# Patient Record
Sex: Female | Born: 1965 | Race: Black or African American | Hispanic: No | Smoking: Current some day smoker
Health system: Southern US, Community
[De-identification: ages and names within clinical notes are randomized; demographics above are authoritative.]

## PROBLEM LIST (undated history)

## (undated) DIAGNOSIS — Z973 Presence of spectacles and contact lenses: Secondary | ICD-10-CM

## (undated) DIAGNOSIS — M797 Fibromyalgia: Secondary | ICD-10-CM

## (undated) DIAGNOSIS — F419 Anxiety disorder, unspecified: Secondary | ICD-10-CM

## (undated) DIAGNOSIS — J302 Other seasonal allergic rhinitis: Secondary | ICD-10-CM

## (undated) DIAGNOSIS — Z794 Long term (current) use of insulin: Secondary | ICD-10-CM

## (undated) DIAGNOSIS — R0981 Nasal congestion: Secondary | ICD-10-CM

## (undated) DIAGNOSIS — Z860100 Personal history of colon polyps, unspecified: Secondary | ICD-10-CM

## (undated) DIAGNOSIS — E7800 Pure hypercholesterolemia, unspecified: Secondary | ICD-10-CM

## (undated) DIAGNOSIS — K219 Gastro-esophageal reflux disease without esophagitis: Secondary | ICD-10-CM

## (undated) DIAGNOSIS — Z972 Presence of dental prosthetic device (complete) (partial): Secondary | ICD-10-CM

## (undated) DIAGNOSIS — K589 Irritable bowel syndrome without diarrhea: Secondary | ICD-10-CM

## (undated) DIAGNOSIS — F329 Major depressive disorder, single episode, unspecified: Secondary | ICD-10-CM

## (undated) DIAGNOSIS — I10 Essential (primary) hypertension: Secondary | ICD-10-CM

## (undated) DIAGNOSIS — Z8659 Personal history of other mental and behavioral disorders: Secondary | ICD-10-CM

## (undated) DIAGNOSIS — E119 Type 2 diabetes mellitus without complications: Secondary | ICD-10-CM

## (undated) DIAGNOSIS — M1990 Unspecified osteoarthritis, unspecified site: Secondary | ICD-10-CM

## (undated) DIAGNOSIS — R519 Headache, unspecified: Secondary | ICD-10-CM

## (undated) DIAGNOSIS — T7840XA Allergy, unspecified, initial encounter: Secondary | ICD-10-CM

## (undated) DIAGNOSIS — M5030 Other cervical disc degeneration, unspecified cervical region: Secondary | ICD-10-CM

## (undated) DIAGNOSIS — E559 Vitamin D deficiency, unspecified: Secondary | ICD-10-CM

## (undated) DIAGNOSIS — G8929 Other chronic pain: Secondary | ICD-10-CM

## (undated) DIAGNOSIS — R51 Headache: Secondary | ICD-10-CM

## (undated) HISTORY — DX: Unspecified osteoarthritis, unspecified site: M19.90

## (undated) HISTORY — DX: Allergy, unspecified, initial encounter: T78.40XA

## (undated) HISTORY — PX: MULTIPLE TOOTH EXTRACTIONS: SHX2053

## (undated) HISTORY — PX: CARDIAC CATHETERIZATION: SHX172

## (undated) HISTORY — PX: BRAIN SURGERY: SHX531

## (undated) HISTORY — DX: Irritable bowel syndrome, unspecified: K58.9

## (undated) HISTORY — PX: TUBAL LIGATION: SHX77

## (undated) HISTORY — PX: ABDOMINAL HYSTERECTOMY: SHX81

---

## 2007-12-09 HISTORY — PX: TOTAL ABDOMINAL HYSTERECTOMY W/ BILATERAL SALPINGOOPHORECTOMY: SHX83

## 2015-11-27 ENCOUNTER — Encounter (HOSPITAL_COMMUNITY): Payer: Self-pay | Admitting: Emergency Medicine

## 2015-11-27 ENCOUNTER — Emergency Department (HOSPITAL_COMMUNITY)
Admission: EM | Admit: 2015-11-27 | Discharge: 2015-11-27 | Disposition: A | Discharge status: Home / Self Care | Payer: Medicaid - Out of State | Attending: Emergency Medicine | Admitting: Emergency Medicine

## 2015-11-27 ENCOUNTER — Emergency Department (HOSPITAL_COMMUNITY): Payer: Medicaid - Out of State

## 2015-11-27 DIAGNOSIS — I10 Essential (primary) hypertension: Secondary | ICD-10-CM | POA: Insufficient documentation

## 2015-11-27 DIAGNOSIS — F17200 Nicotine dependence, unspecified, uncomplicated: Secondary | ICD-10-CM | POA: Diagnosis not present

## 2015-11-27 DIAGNOSIS — Z7982 Long term (current) use of aspirin: Secondary | ICD-10-CM | POA: Diagnosis not present

## 2015-11-27 DIAGNOSIS — R0981 Nasal congestion: Secondary | ICD-10-CM | POA: Diagnosis present

## 2015-11-27 DIAGNOSIS — E119 Type 2 diabetes mellitus without complications: Secondary | ICD-10-CM | POA: Insufficient documentation

## 2015-11-27 DIAGNOSIS — Z794 Long term (current) use of insulin: Secondary | ICD-10-CM | POA: Insufficient documentation

## 2015-11-27 DIAGNOSIS — J0101 Acute recurrent maxillary sinusitis: Secondary | ICD-10-CM | POA: Diagnosis not present

## 2015-11-27 DIAGNOSIS — Z79899 Other long term (current) drug therapy: Secondary | ICD-10-CM | POA: Insufficient documentation

## 2015-11-27 HISTORY — DX: Essential (primary) hypertension: I10

## 2015-11-27 LAB — I-STAT TROPONIN, ED
TROPONIN I, POC: 0 ng/mL (ref 0.00–0.08)
Troponin i, poc: 0 ng/mL (ref 0.00–0.08)

## 2015-11-27 LAB — CBC
HCT: 37.1 % (ref 36.0–46.0)
Hemoglobin: 12.1 g/dL (ref 12.0–15.0)
MCH: 26.8 pg (ref 26.0–34.0)
MCHC: 32.6 g/dL (ref 30.0–36.0)
MCV: 82.3 fL (ref 78.0–100.0)
PLATELETS: 279 10*3/uL (ref 150–400)
RBC: 4.51 MIL/uL (ref 3.87–5.11)
RDW: 13.1 % (ref 11.5–15.5)
WBC: 9.2 10*3/uL (ref 4.0–10.5)

## 2015-11-27 LAB — COMPREHENSIVE METABOLIC PANEL
ALT: 19 U/L (ref 14–54)
AST: 18 U/L (ref 15–41)
Albumin: 3.2 g/dL — ABNORMAL LOW (ref 3.5–5.0)
Alkaline Phosphatase: 85 U/L (ref 38–126)
Anion gap: 8 (ref 5–15)
BUN: 8 mg/dL (ref 6–20)
CHLORIDE: 105 mmol/L (ref 101–111)
CO2: 27 mmol/L (ref 22–32)
Calcium: 9.4 mg/dL (ref 8.9–10.3)
Creatinine, Ser: 0.71 mg/dL (ref 0.44–1.00)
GFR calc Af Amer: >60 mL/min (ref >60)
Glucose, Bld: 297 mg/dL — ABNORMAL HIGH (ref 65–99)
POTASSIUM: 4 mmol/L (ref 3.5–5.1)
SODIUM: 140 mmol/L (ref 135–145)
Total Bilirubin: 0.4 mg/dL (ref 0.3–1.2)
Total Protein: 6.4 g/dL — ABNORMAL LOW (ref 6.5–8.1)

## 2015-11-27 MED ORDER — AMOXICILLIN-POT CLAVULANATE 500-125 MG PO TABS: 1.0000 | ORAL_TABLET | Freq: Three times a day (TID) | ORAL | Status: DC

## 2015-11-27 MED ORDER — NASAL MOISTURIZER COMBINATION NA AERS: 1.0000 | INHALATION_SPRAY | Freq: Three times a day (TID) | NASAL | Status: DC

## 2015-11-27 NOTE — ED Notes (Signed)
Pt departed in NAD.  

## 2015-11-27 NOTE — ED Provider Notes (Signed)
CSN: [id number]     Arrival date & time 11/27/15  0203 History   By signing my name below, I, Evelene Croon, attest that this documentation has been prepared under the direction and in the presence of [name], MD . Electronically Signed: Evelene Croon, Scribe. 11/27/2015. 3:06 AM.    Chief Complaint  Patient presents with  . Nasal Congestion    The history is provided by the patient. No language interpreter was used.   HPI Comments:  Ellen Avery is a 49 y.o. female who presents to the Emergency Department complaining of nasal congestion with associated facial pressure and bilateral ear pain for ~ 2 months. She also reports  dry cough which is worse at night, and LUE pain. She denies recent fever, SOB, CP, swelling to her extremities, nausea and vomiting. No alleviating factors noted. No recent travel/long periods of immobilization or h/o blood clots. She notes sick contacts at home; states her grandson has a stomach virus. Pt is a smoker, ~ 1 ppd.    Past Medical History  Diagnosis Date  . Hypertension   . Diabetes mellitus without complication [facility])    Past Surgical History  Procedure Laterality Date  . Abdominal hysterectomy     No family history on file. Social History  Substance Use Topics  . Smoking status: Current Every Day Smoker  . Smokeless tobacco: None  . Alcohol Use: No   OB History    No data available     Review of Systems  HENT: Positive for congestion and ear pain.   Respiratory: Positive for cough. Negative for shortness of breath.   Cardiovascular: Negative for chest pain and leg swelling.  Gastrointestinal: Negative for nausea and vomiting.  All other systems reviewed and are negative.    Allergies  Flagyl  Home Medications   Prior to Admission medications   Medication Sig Start Date End Date Taking? Authorizing Provider  ARIPiprazole (ABILIFY) 15 MG tablet Take 15 mg by mouth daily.   Yes Historical Provider, MD  aspirin EC 81 MG tablet  Take 81 mg by mouth daily.   Yes Historical Provider, MD  esomeprazole (NEXIUM) 40 MG capsule Take 40 mg by mouth daily at 12 noon.   Yes Historical Provider, MD  FLUoxetine (PROZAC) 20 MG capsule Take 20 mg by mouth daily.   Yes Historical Provider, MD  insulin aspart (NOVOLOG) 100 UNIT/ML injection Inject 0-5 Units into the skin 3 (three) times daily before meals. Sliding scale   Yes Historical Provider, MD  insulin glargine (LANTUS) 100 UNIT/ML injection Inject 10 Units into the skin at bedtime.   Yes Historical Provider, MD  lisinopril (PRINIVIL,ZESTRIL) 10 MG tablet Take 10 mg by mouth daily.   Yes Historical Provider, MD  metFORMIN (GLUCOPHAGE) 1000 MG tablet Take 1,000 mg by mouth daily with breakfast.   Yes Historical Provider, MD  simvastatin (ZOCOR) 40 MG tablet Take 40 mg by mouth daily.   Yes Historical Provider, MD  amoxicillin-clavulanate (AUGMENTIN) 500-125 MG tablet Take 1 tablet (500 mg total) by mouth every 8 (eight) hours. 11/27/15   [name], MD  Nasal Moisturizer Combination (OCEAN COMPLETE SINUS RINSE) AERS Place 1 spray into the nose every 8 (eight) hours. 11/27/15   [name], MD   BP 112/70 mmHg  Pulse 60  Temp(Src) 98.3 F (36.8 C) (Oral)  Resp 20  SpO2 100% Physical Exam  Constitutional: She is oriented to person, place, and time. She appears well-developed and well-nourished. No distress.  HENT:  Head: Normocephalic and atraumatic.  Nose: Rhinorrhea present.  Mouth/Throat: Posterior oropharyngeal erythema present.  Bilateral middle ear effusion; no redness   Eyes: Conjunctivae are normal.  Cardiovascular: Normal rate, regular rhythm and normal heart sounds.   Pulmonary/Chest: Effort normal and breath sounds normal. No respiratory distress.  Abdominal: Soft. Bowel sounds are normal. She exhibits no distension. There is no tenderness. There is no rebound and no guarding.  Neurological: She is alert and oriented to person, place, and time.  Skin: Skin is  warm and dry.  Psychiatric: She has a normal mood and affect.  Nursing note and vitals reviewed.   ED Course  Procedures   DIAGNOSTIC STUDIES:  Oxygen Saturation is 95% on RA, adequate by my interpretation.    COORDINATION OF CARE:  2:54 AM Smoking cessation instruction/counseling given:  counseled patient on the dangers of tobacco use, advised patient to stop smoking, and reviewed strategies to maximize success  3:00 AM Discussed treatment plan with pt at bedside and pt agreed to plan.  Labs Review Labs Reviewed  COMPREHENSIVE METABOLIC PANEL - Abnormal; Notable for the following:    Glucose, Bld 297 (*)    Total Protein 6.4 (*)    Albumin 3.2 (*)    All other components within normal limits  CBC  I-STAT TROPOININ, ED  Randolm Idol, ED    Imaging Review Dg Chest 2 View  11/27/2015  CLINICAL DATA:  49 year old female with dry cough EXAM: CHEST  2 VIEW COMPARISON:  None. FINDINGS: The heart size and mediastinal contours are within normal limits. Both lungs are clear. The visualized skeletal structures are unremarkable. IMPRESSION: No active cardiopulmonary disease. Electronically Signed   By: Anner Crete M.D.   On: 11/27/2015 04:02   I have personally reviewed and evaluated these images and lab results as part of my medical decision-making.   EKG Interpretation   Date/Time:  Tuesday November 27 2015 03:19:29 EST Ventricular Rate:  66 PR Interval:  143 QRS Duration: 97 QT Interval:  399 QTC Calculation: 418 R Axis:   58 Text Interpretation:  Sinus rhythm Borderline T wave abnormalities  Confirmed by [facility] MD, Corene Cornea [phone]) on 11/27/2015 3:55:44 AM      MDM   Final diagnoses:  Acute recurrent maxillary sinusitis   Likely recurrent acute sinusitis, will tx accordingly.   Sensation of Left arm squeezing with DM, HTN, HLD, smoking history so delta troponins checked and ecg which were all normal.   I personally performed the services described in this  documentation, which was scribed in my presence. The recorded information has been reviewed and is accurate.    [name], MD 11/27/15 [phone]

## 2015-11-27 NOTE — ED Notes (Signed)
Pt. presents with multiple complaints : reports nasal congestion , dry cough , bilateral ear ache for 1 month and left arm pain onset last week .

## 2016-01-09 ENCOUNTER — Encounter (HOSPITAL_COMMUNITY): Payer: Self-pay | Admitting: Emergency Medicine

## 2016-01-09 ENCOUNTER — Emergency Department (HOSPITAL_COMMUNITY)
Admission: EM | Admit: 2016-01-09 | Discharge: 2016-01-09 | Disposition: A | Discharge status: Home / Self Care | Payer: Medicaid Other | Attending: Emergency Medicine | Admitting: Emergency Medicine

## 2016-01-09 DIAGNOSIS — R197 Diarrhea, unspecified: Secondary | ICD-10-CM | POA: Diagnosis not present

## 2016-01-09 DIAGNOSIS — Z7951 Long term (current) use of inhaled steroids: Secondary | ICD-10-CM | POA: Diagnosis not present

## 2016-01-09 DIAGNOSIS — F17200 Nicotine dependence, unspecified, uncomplicated: Secondary | ICD-10-CM | POA: Insufficient documentation

## 2016-01-09 DIAGNOSIS — I10 Essential (primary) hypertension: Secondary | ICD-10-CM | POA: Diagnosis not present

## 2016-01-09 DIAGNOSIS — Z7982 Long term (current) use of aspirin: Secondary | ICD-10-CM | POA: Insufficient documentation

## 2016-01-09 DIAGNOSIS — R110 Nausea: Secondary | ICD-10-CM | POA: Insufficient documentation

## 2016-01-09 DIAGNOSIS — Z7984 Long term (current) use of oral hypoglycemic drugs: Secondary | ICD-10-CM | POA: Diagnosis not present

## 2016-01-09 DIAGNOSIS — E119 Type 2 diabetes mellitus without complications: Secondary | ICD-10-CM | POA: Insufficient documentation

## 2016-01-09 DIAGNOSIS — Z794 Long term (current) use of insulin: Secondary | ICD-10-CM | POA: Insufficient documentation

## 2016-01-09 DIAGNOSIS — Z79899 Other long term (current) drug therapy: Secondary | ICD-10-CM | POA: Diagnosis not present

## 2016-01-09 LAB — COMPREHENSIVE METABOLIC PANEL
ALK PHOS: 99 U/L (ref 38–126)
ALT: 18 U/L (ref 14–54)
ANION GAP: 18 — AB (ref 5–15)
AST: 23 U/L (ref 15–41)
Albumin: 3.8 g/dL (ref 3.5–5.0)
BILIRUBIN TOTAL: 0.4 mg/dL (ref 0.3–1.2)
BUN: 14 mg/dL (ref 6–20)
CALCIUM: 9.5 mg/dL (ref 8.9–10.3)
CO2: 20 mmol/L — ABNORMAL LOW (ref 22–32)
Chloride: 98 mmol/L — ABNORMAL LOW (ref 101–111)
Creatinine, Ser: 1.02 mg/dL — ABNORMAL HIGH (ref 0.44–1.00)
Glucose, Bld: 280 mg/dL — ABNORMAL HIGH (ref 65–99)
Potassium: 3.9 mmol/L (ref 3.5–5.1)
Sodium: 136 mmol/L (ref 135–145)
TOTAL PROTEIN: 7.6 g/dL (ref 6.5–8.1)

## 2016-01-09 LAB — BASIC METABOLIC PANEL
Anion gap: 15 (ref 5–15)
BUN: 14 mg/dL (ref 6–20)
CO2: 22 mmol/L (ref 22–32)
Calcium: 8.4 mg/dL — ABNORMAL LOW (ref 8.9–10.3)
Chloride: 101 mmol/L (ref 101–111)
Creatinine, Ser: 1.02 mg/dL — ABNORMAL HIGH (ref 0.44–1.00)
Glucose, Bld: 249 mg/dL — ABNORMAL HIGH (ref 65–99)
POTASSIUM: 4 mmol/L (ref 3.5–5.1)
SODIUM: 138 mmol/L (ref 135–145)

## 2016-01-09 LAB — URINE MICROSCOPIC-ADD ON

## 2016-01-09 LAB — CBC
HCT: 46.8 % — ABNORMAL HIGH (ref 36.0–46.0)
HEMOGLOBIN: 14.5 g/dL (ref 12.0–15.0)
MCH: 28.4 pg (ref 26.0–34.0)
MCHC: 31 g/dL (ref 30.0–36.0)
MCV: 91.6 fL (ref 78.0–100.0)
Platelets: 329 10*3/uL (ref 150–400)
RBC: 5.11 MIL/uL (ref 3.87–5.11)
RDW: 13.8 % (ref 11.5–15.5)
WBC: 12.3 10*3/uL — ABNORMAL HIGH (ref 4.0–10.5)

## 2016-01-09 LAB — URINALYSIS, ROUTINE W REFLEX MICROSCOPIC
Glucose, UA: >1000 mg/dL — AB
Ketones, ur: 15 mg/dL — AB
Leukocytes, UA: NEGATIVE
NITRITE: NEGATIVE
Protein, ur: 100 mg/dL — AB
SPECIFIC GRAVITY, URINE: 1.034 — AB (ref 1.005–1.030)
pH: 5 (ref 5.0–8.0)

## 2016-01-09 LAB — LIPASE, BLOOD: Lipase: 27 U/L (ref 11–51)

## 2016-01-09 MED ORDER — ONDANSETRON HCL 4 MG PO TABS
4.0000 mg | ORAL_TABLET | Freq: Two times a day (BID) | ORAL | Status: DC | PRN
Start: 2016-01-09 — End: 2017-01-23

## 2016-01-09 MED ORDER — ONDANSETRON HCL 4 MG/2ML IJ SOLN
4.0000 mg | Freq: Once | INTRAMUSCULAR | Status: AC
  Administered 2016-01-09: 4 mg via INTRAVENOUS
  Filled 2016-01-09: qty 2

## 2016-01-09 MED ORDER — SODIUM CHLORIDE 0.9 % IV BOLUS (SEPSIS)
500.0000 mL | Freq: Once | INTRAVENOUS | Status: AC
  Administered 2016-01-09: 500 mL via INTRAVENOUS

## 2016-01-09 MED ORDER — SODIUM CHLORIDE 0.9 % IV BOLUS (SEPSIS)
1000.0000 mL | Freq: Once | INTRAVENOUS | Status: AC
  Administered 2016-01-09: 1000 mL via INTRAVENOUS

## 2016-01-09 MED ORDER — SODIUM CHLORIDE 0.9 % IV BOLUS (SEPSIS): 1000.0000 mL | Freq: Once | INTRAVENOUS | Status: DC

## 2016-01-09 NOTE — Discharge Instructions (Signed)

## 2016-01-09 NOTE — ED Notes (Signed)
Patient drinking second ginger ale and eating [place] sandwich

## 2016-01-09 NOTE — ED Notes (Signed)
Pt from home for eval of nausea and loss of appetite with diarrhea since Friday, pt states recently started on new BP meds on Friday as well and notes that all of her symptoms are side effects. Pt states she does not know the name. nad noted.

## 2016-01-09 NOTE — ED Provider Notes (Signed)
CSN: [id number]     Arrival date & time 01/09/16  1821 History   First MD Initiated Contact with Patient 01/09/16 2033     Chief Complaint  Patient presents with  . Emesis  . Diarrhea     (Consider location/radiation/quality/duration/timing/severity/associated sxs/prior Treatment) HPI   45 y f w pmh htn and dm who comes in with nausea but no vomiting and multiple episodes of non-bloody diarrhea since Friday.  No fever/chills, abdominal pain or other sx.  Patient attributes her sx to being started on a new blood pressure medicine on Friday which she can't recall the name of.  Past Medical History  Diagnosis Date  . Hypertension   . Diabetes mellitus without complication [facility])    Past Surgical History  Procedure Laterality Date  . Abdominal hysterectomy     No family history on file. Social History  Substance Use Topics  . Smoking status: Current Every Day Smoker  . Smokeless tobacco: None  . Alcohol Use: No   OB History    No data available     Review of Systems  Constitutional: Negative for fever and chills.  HENT: Negative for nosebleeds.   Eyes: Negative for visual disturbance.  Respiratory: Negative for cough and shortness of breath.   Cardiovascular: Negative for chest pain.  Gastrointestinal: Positive for nausea and diarrhea. Negative for vomiting, abdominal pain and constipation.  Genitourinary: Negative for dysuria.  Skin: Negative for rash.  Neurological: Negative for weakness.  All other systems reviewed and are negative.     Allergies  Flagyl  Home Medications   Prior to Admission medications   Medication Sig Start Date End Date Taking? Authorizing Provider  acetaminophen (TYLENOL) 500 MG tablet Take 1,000 mg by mouth every 6 (six) hours as needed for mild pain.   Yes Historical Provider, MD  aspirin EC 81 MG tablet Take 81 mg by mouth daily.   Yes Historical Provider, MD  esomeprazole (NEXIUM) 40 MG capsule Take 40 mg by mouth daily at 12 noon.    Yes Historical Provider, MD  FLUoxetine (PROZAC) 20 MG capsule Take 20 mg by mouth daily.   Yes Historical Provider, MD  insulin aspart (NOVOLOG) 100 UNIT/ML injection Inject 0-5 Units into the skin 3 (three) times daily before meals. Sliding scale   Yes Historical Provider, MD  insulin glargine (LANTUS) 100 UNIT/ML injection Inject 10 Units into the skin at bedtime.   Yes Historical Provider, MD  metFORMIN (GLUCOPHAGE) 1000 MG tablet Take 1,000 mg by mouth daily with breakfast.   Yes Historical Provider, MD  simvastatin (ZOCOR) 40 MG tablet Take 40 mg by mouth every evening.    Yes Historical Provider, MD  ARIPiprazole (ABILIFY) 15 MG tablet Take 15 mg by mouth daily.    Historical Provider, MD  ondansetron (ZOFRAN) 4 MG tablet Take 1 tablet (4 mg total) by mouth every 12 (twelve) hours as needed for nausea or vomiting. 01/09/16   Jarome Matin, MD   BP 107/77 mmHg  Pulse 98  Temp(Src) 98 F (36.7 C) (Oral)  Resp 16  Ht 5\' 7"  (1.702 m)  Wt 119.296 kg  BMI 41.18 kg/m2  SpO2 96% Physical Exam  Constitutional: She is oriented to person, place, and time. No distress.  HENT:  Head: Normocephalic and atraumatic.  Eyes: EOM are normal. Pupils are equal, round, and reactive to light.  Neck: Normal range of motion. Neck supple.  Cardiovascular: Normal rate and intact distal pulses.   Pulmonary/Chest: No respiratory distress.  Abdominal:  Soft. She exhibits no distension. There is no tenderness. There is no rebound and no guarding.  Musculoskeletal: Normal range of motion.  Neurological: She is alert and oriented to person, place, and time.  Skin: No rash noted. She is not diaphoretic.  Psychiatric: She has a normal mood and affect.    ED Course  Procedures (including critical care time) Labs Review Labs Reviewed  COMPREHENSIVE METABOLIC PANEL - Abnormal; Notable for the following:    Chloride 98 (*)    CO2 20 (*)    Glucose, Bld 280 (*)    Creatinine, Ser 1.02 (*)    Anion gap 18 (*)     All other components within normal limits  CBC - Abnormal; Notable for the following:    WBC 12.3 (*)    HCT 46.8 (*)    All other components within normal limits  URINALYSIS, ROUTINE W REFLEX MICROSCOPIC (NOT AT [facility]) - Abnormal; Notable for the following:    Color, Urine AMBER (*)    APPearance CLOUDY (*)    Specific Gravity, Urine 1.034 (*)    Glucose, UA >1000 (*)    Hgb urine dipstick TRACE (*)    Bilirubin Urine SMALL (*)    Ketones, ur 15 (*)    Protein, ur 100 (*)    All other components within normal limits  URINE MICROSCOPIC-ADD ON - Abnormal; Notable for the following:    Squamous Epithelial / LPF 6-30 (*)    Bacteria, UA RARE (*)    Casts HYALINE CASTS (*)    All other components within normal limits  BASIC METABOLIC PANEL - Abnormal; Notable for the following:    Glucose, Bld 249 (*)    Creatinine, Ser 1.02 (*)    Calcium 8.4 (*)    All other components within normal limits  LIPASE, BLOOD    Imaging Review No results found. I have personally reviewed and evaluated these images and lab results as part of my medical decision-making.   EKG Interpretation None      MDM   Final diagnoses:  Diarrhea, unspecified type    31 y f w pmh htn and dm who comes in with nausea with no vomiting and multiple episodes of non-bloody diarrhea since Friday  Exam as above.  AFVSS.  Cbc/cmp obtained and only sig for a slightly low bicarb.  She is very well appearing.  I have given ns bolus and zofran.  Patient says that she feels "much better" after fluid bolus.  She is non-toxic appearing, able to tolerate PO without difficulty.  Repeat bmp improved.  At this point feel safe for d/c.  Likely viral illness vs. Medicine side effect.  Will have her call her pcp to discuss her new bp med that might be causing diarrhea  I have discussed the results, Dx and Tx plan with the pt. They expressed understanding and agree with the plan and were told to return to ED with any worsening of  condition or concern.    Disposition: Discharge  Condition: Good  Discharge Medication List as of 01/09/2016 11:22 PM    START taking these medications   Details  ondansetron (ZOFRAN) 4 MG tablet Take 1 tablet (4 mg total) by mouth every 12 (twelve) hours as needed for nausea or vomiting., Starting 01/09/2016, Until Discontinued, Print        Follow Up: [place] [address] [id number] [place] [phone]     Pt seen in  conjunction with Dr. Debera Lat, MD 01/10/16 1337  Pattricia Boss, MD 01/11/16 [id number]

## 2016-01-09 NOTE — ED Notes (Signed)
Patient stated she had her BP med changed on Friday because her med was not working and after taking the BP med she started with nausea and diarrhea. Talked with her MD and was told to take half of a pill.  Stated she has done that but symptoms continued

## 2016-01-09 NOTE — ED Notes (Signed)
Discharge instructions and prescription given- voiced understanding 

## 2016-02-01 ENCOUNTER — Emergency Department (HOSPITAL_COMMUNITY): Payer: Medicaid Other

## 2016-02-01 ENCOUNTER — Encounter (HOSPITAL_COMMUNITY): Payer: Self-pay | Admitting: *Deleted

## 2016-02-01 ENCOUNTER — Emergency Department (HOSPITAL_COMMUNITY)
Admission: EM | Admit: 2016-02-01 | Discharge: 2016-02-02 | Disposition: A | Discharge status: Home / Self Care | Payer: Medicaid Other | Attending: Emergency Medicine | Admitting: Emergency Medicine

## 2016-02-01 DIAGNOSIS — Z7984 Long term (current) use of oral hypoglycemic drugs: Secondary | ICD-10-CM | POA: Diagnosis not present

## 2016-02-01 DIAGNOSIS — Z79899 Other long term (current) drug therapy: Secondary | ICD-10-CM | POA: Diagnosis not present

## 2016-02-01 DIAGNOSIS — H9203 Otalgia, bilateral: Secondary | ICD-10-CM | POA: Diagnosis not present

## 2016-02-01 DIAGNOSIS — J40 Bronchitis, not specified as acute or chronic: Secondary | ICD-10-CM | POA: Insufficient documentation

## 2016-02-01 DIAGNOSIS — R110 Nausea: Secondary | ICD-10-CM | POA: Insufficient documentation

## 2016-02-01 DIAGNOSIS — Z794 Long term (current) use of insulin: Secondary | ICD-10-CM | POA: Insufficient documentation

## 2016-02-01 DIAGNOSIS — Z7982 Long term (current) use of aspirin: Secondary | ICD-10-CM | POA: Diagnosis not present

## 2016-02-01 DIAGNOSIS — R350 Frequency of micturition: Secondary | ICD-10-CM | POA: Insufficient documentation

## 2016-02-01 DIAGNOSIS — I10 Essential (primary) hypertension: Secondary | ICD-10-CM | POA: Diagnosis not present

## 2016-02-01 DIAGNOSIS — R0981 Nasal congestion: Secondary | ICD-10-CM | POA: Diagnosis present

## 2016-02-01 DIAGNOSIS — F17200 Nicotine dependence, unspecified, uncomplicated: Secondary | ICD-10-CM | POA: Diagnosis not present

## 2016-02-01 DIAGNOSIS — E119 Type 2 diabetes mellitus without complications: Secondary | ICD-10-CM | POA: Insufficient documentation

## 2016-02-01 DIAGNOSIS — M546 Pain in thoracic spine: Secondary | ICD-10-CM | POA: Diagnosis not present

## 2016-02-01 LAB — I-STAT CHEM 8, ED
BUN: 11 mg/dL (ref 6–20)
CALCIUM ION: 1.13 mmol/L (ref 1.12–1.23)
CREATININE: 0.8 mg/dL (ref 0.44–1.00)
Chloride: 97 mmol/L — ABNORMAL LOW (ref 101–111)
Glucose, Bld: 247 mg/dL — ABNORMAL HIGH (ref 65–99)
HCT: 41 % (ref 36.0–46.0)
HEMOGLOBIN: 13.9 g/dL (ref 12.0–15.0)
Potassium: 3.5 mmol/L (ref 3.5–5.1)
SODIUM: 137 mmol/L (ref 135–145)
TCO2: 27 mmol/L (ref 0–100)

## 2016-02-01 MED ORDER — SODIUM CHLORIDE 0.9 % IV BOLUS (SEPSIS)
1000.0000 mL | Freq: Once | INTRAVENOUS | Status: AC
  Administered 2016-02-01: 1000 mL via INTRAVENOUS

## 2016-02-01 MED ORDER — GUAIFENESIN 100 MG/5ML PO LIQD: 100.0000 mg | ORAL | Status: DC | PRN

## 2016-02-01 MED ORDER — AZITHROMYCIN 250 MG PO TABS: 250.0000 mg | ORAL_TABLET | Freq: Every day | ORAL | Status: DC

## 2016-02-01 MED ORDER — BENZONATATE 100 MG PO CAPS: 100.0000 mg | ORAL_CAPSULE | Freq: Three times a day (TID) | ORAL | Status: DC

## 2016-02-01 NOTE — ED Notes (Signed)
CALLED PT FOR TRIAGE BUT NO ANSWER

## 2016-02-01 NOTE — ED Provider Notes (Signed)
CSN: [id number]     Arrival date & time 02/01/16  2045 History  By signing my name below, I, Irene Pap, attest that this documentation has been prepared under the direction and in the presence of Temara Lanum, PA-C. Electronically Signed: Irene Pap, ED Scribe. 02/01/2016. 10:14 PM.   Chief Complaint  Patient presents with  . Generalized Body Aches  . Nasal Congestion  . Otalgia   The history is provided by the patient. No language interpreter was used.  HPI Comments: Ellen Avery is a 50 y.o. Female with a hx of HTN and DM who presents to the Emergency Department complaining of generalized myalgias onset one week ago. Pt reports associated bilateral otalgia, sore throat, non-productive cough, left middle back pain, SOB after cough, nausea, frequency, fever tmax 101 F and nasal congestion. Pt's grandson tested positive for the flu and pt has been around him. She has taken Tylenol, nasal spray, and Zyrtec to mild relief of congestion. Pt states that her CBG levels have been in the 300s, last checked today. She denies vomiting, abdominal pain, diarrhea, or dysuria.  Past Medical History  Diagnosis Date  . Hypertension   . Diabetes mellitus without complication [facility])    Past Surgical History  Procedure Laterality Date  . Abdominal hysterectomy     No family history on file. Social History  Substance Use Topics  . Smoking status: Current Every Day Smoker  . Smokeless tobacco: None  . Alcohol Use: No   OB History    No data available     Review of Systems  Constitutional: Positive for fever.  HENT: Positive for congestion, ear pain and sore throat.   Respiratory: Positive for cough and shortness of breath.   Gastrointestinal: Positive for nausea. Negative for vomiting and diarrhea.  Genitourinary: Positive for frequency. Negative for dysuria.  Musculoskeletal: Positive for myalgias and back pain.  All other systems reviewed and are negative.  Allergies  Flagyl  Home  Medications   Prior to Admission medications   Medication Sig Start Date End Date Taking? Authorizing Provider  acetaminophen (TYLENOL) 500 MG tablet Take 1,000 mg by mouth every 6 (six) hours as needed for mild pain.    Historical Provider, MD  ARIPiprazole (ABILIFY) 15 MG tablet Take 15 mg by mouth daily.    Historical Provider, MD  aspirin EC 81 MG tablet Take 81 mg by mouth daily.    Historical Provider, MD  esomeprazole (NEXIUM) 40 MG capsule Take 40 mg by mouth daily at 12 noon.    Historical Provider, MD  FLUoxetine (PROZAC) 20 MG capsule Take 20 mg by mouth daily.    Historical Provider, MD  insulin aspart (NOVOLOG) 100 UNIT/ML injection Inject 0-5 Units into the skin 3 (three) times daily before meals. Sliding scale    Historical Provider, MD  insulin glargine (LANTUS) 100 UNIT/ML injection Inject 10 Units into the skin at bedtime.    Historical Provider, MD  metFORMIN (GLUCOPHAGE) 1000 MG tablet Take 1,000 mg by mouth daily with breakfast.    Historical Provider, MD  ondansetron (ZOFRAN) 4 MG tablet Take 1 tablet (4 mg total) by mouth every 12 (twelve) hours as needed for nausea or vomiting. 01/09/16   Jarome Matin, MD  simvastatin (ZOCOR) 40 MG tablet Take 40 mg by mouth every evening.     Historical Provider, MD   BP 132/86 mmHg  Pulse 112  Temp(Src) 99.5 F (37.5 C) (Oral)  Resp 18  SpO2 95% Physical Exam  Constitutional: She  appears well-developed and well-nourished. No distress.  HENT:  Head: Normocephalic and atraumatic.  Mouth/Throat: Oropharynx is clear and moist. No oropharyngeal exudate.  Tenderness of the bilateral frontal and maxillary sinuses  Eyes: Conjunctivae are normal.  Neck: Normal range of motion. Neck supple.  Cardiovascular: Normal rate and regular rhythm.   Pulmonary/Chest: Effort normal and breath sounds normal. No respiratory distress. She has no wheezes. She has no rales.  Neurological: She is alert. She exhibits normal muscle tone.  Skin: She is not  diaphoretic.  Nursing note and vitals reviewed.   ED Course  Procedures (including critical care time) DIAGNOSTIC STUDIES: Oxygen Saturation is 95% on RA, adequate by my interpretation.    COORDINATION OF CARE: 10:12 PM-Discussed treatment plan which includes labs, IV fluids and x-ray with pt at bedside and pt agreed to plan.    Labs Review Labs Reviewed  I-STAT CHEM 8, ED - Abnormal; Notable for the following:    Chloride 97 (*)    Glucose, Bld 247 (*)    All other components within normal limits    Imaging Review Dg Chest 2 View  02/01/2016  CLINICAL DATA:  50 year old 1 week history of fever, cough, chest congestion and shortness of breath. Smoker with current history of hypertension diabetes. EXAM: CHEST  2 VIEW COMPARISON:  11/27/2015. FINDINGS: Suboptimal inspiration accounts for crowded bronchovascular markings, especially in the bases, and accentuates the cardiac silhouette. Taking this into account, cardiomediastinal silhouette unremarkable and unchanged. Lungs clear. Bronchovascular markings normal. Pulmonary vascularity normal. No visible pleural effusions. No pneumothorax. Mild degenerative changes involving the thoracic spine. IMPRESSION: Suboptimal inspiration.  No acute cardiopulmonary disease. Electronically Signed   By: Evangeline Dakin M.D.   On: 02/01/2016 22:36   I have personally reviewed and evaluated these images and lab results as part of my medical decision-making.   EKG Interpretation None      MDM   Final diagnoses:  Bronchitis   Afebrile, nontoxic patient with constellation of symptoms suggestive of viral syndrome.  No concerning findings on exam.  Pt has hx DM, blood glucose has been uncontrolled.  Still having fevers after 1 week, has developing pain in left side.  Will treat with antibiotics due to prolonged course and underlying diabetes.  Discharged home with z-pak, supportive care, PCP follow up.  Discussed result, findings, treatment, and follow  up  with patient.  Pt given return precautions.  Pt verbalizes understanding and agrees with plan.       I personally performed the services described in this documentation, which was scribed in my presence. The recorded information has been reviewed and is accurate.    Clayton Bibles, PA-C 02/02/16 0139  Quintella Reichert, MD 02/04/16 [phone]

## 2016-02-01 NOTE — ED Notes (Signed)
Pt c/o body aches, ear pain, congestion x 1 week.

## 2016-02-01 NOTE — Discharge Instructions (Signed)
Read the information below.  Use the prescribed medication as directed.  Please discuss all new medications with your pharmacist.  You may return to the Emergency Department at any time for worsening condition or any new symptoms that concern you.   If you develop high fevers that do not resolve with tylenol or ibuprofen, you have difficulty swallowing or breathing, or you are unable to tolerate fluids by mouth, return to the ER for a recheck.      Upper Respiratory Infection, Adult Most upper respiratory infections (URIs) are caused by a virus. A URI affects the nose, throat, and upper air passages. The most common type of URI is often called "the common cold." HOME CARE   Take medicines only as told by your doctor.  Gargle warm saltwater or take cough drops to comfort your throat as told by your doctor.  Use a warm mist humidifier or inhale steam from a shower to increase air moisture. This may make it easier to breathe.  Drink enough fluid to keep your pee (urine) clear or pale yellow.  Eat soups and other clear broths.  Have a healthy diet.  Rest as needed.  Go back to work when your fever is gone or your doctor says it is okay.  You may need to stay home longer to avoid giving your URI to others.  You can also wear a face mask and wash your hands often to prevent spread of the virus.  Use your inhaler more if you have asthma.  Do not use any tobacco products, including cigarettes, chewing tobacco, or electronic cigarettes. If you need help quitting, ask your doctor. GET HELP IF:  You are getting worse, not better.  Your symptoms are not helped by medicine.  You have chills.  You are getting more short of breath.  You have brown or red mucus.  You have yellow or brown discharge from your nose.  You have pain in your face, especially when you bend forward.  You have a fever.  You have puffy (swollen) neck glands.  You have pain while swallowing.  You have white  areas in the back of your throat. GET HELP RIGHT AWAY IF:   You have very bad or constant:  Headache.  Ear pain.  Pain in your forehead, behind your eyes, and over your cheekbones (sinus pain).  Chest pain.  You have long-lasting (chronic) lung disease and any of the following:  Wheezing.  Long-lasting cough.  Coughing up blood.  A change in your usual mucus.  You have a stiff neck.  You have changes in your:  Vision.  Hearing.  Thinking.  Mood. MAKE SURE YOU:   Understand these instructions.  Will watch your condition.  Will get help right away if you are not doing well or get worse.   This information is not intended to replace advice given to you by your health care provider. Make sure you discuss any questions you have with your health care provider.   Document Released: 05/12/2008 Document Revised: 04/10/2015 Document Reviewed: 03/01/2014 Elsevier Interactive Patient Education [employer].

## 2016-02-21 ENCOUNTER — Encounter (HOSPITAL_COMMUNITY): Payer: Self-pay

## 2016-02-21 ENCOUNTER — Emergency Department (HOSPITAL_COMMUNITY): Payer: Medicaid Other

## 2016-02-21 ENCOUNTER — Emergency Department (HOSPITAL_COMMUNITY)
Admission: EM | Admit: 2016-02-21 | Discharge: 2016-02-21 | Disposition: A | Discharge status: Home / Self Care | Payer: Medicaid Other | Attending: Emergency Medicine | Admitting: Emergency Medicine

## 2016-02-21 ENCOUNTER — Other Ambulatory Visit: Payer: Self-pay

## 2016-02-21 DIAGNOSIS — Z79899 Other long term (current) drug therapy: Secondary | ICD-10-CM | POA: Insufficient documentation

## 2016-02-21 DIAGNOSIS — Z794 Long term (current) use of insulin: Secondary | ICD-10-CM | POA: Insufficient documentation

## 2016-02-21 DIAGNOSIS — K219 Gastro-esophageal reflux disease without esophagitis: Secondary | ICD-10-CM | POA: Insufficient documentation

## 2016-02-21 DIAGNOSIS — F329 Major depressive disorder, single episode, unspecified: Secondary | ICD-10-CM | POA: Diagnosis not present

## 2016-02-21 DIAGNOSIS — F17200 Nicotine dependence, unspecified, uncomplicated: Secondary | ICD-10-CM | POA: Diagnosis not present

## 2016-02-21 DIAGNOSIS — I10 Essential (primary) hypertension: Secondary | ICD-10-CM | POA: Insufficient documentation

## 2016-02-21 DIAGNOSIS — Z7982 Long term (current) use of aspirin: Secondary | ICD-10-CM | POA: Diagnosis not present

## 2016-02-21 DIAGNOSIS — E119 Type 2 diabetes mellitus without complications: Secondary | ICD-10-CM | POA: Diagnosis not present

## 2016-02-21 DIAGNOSIS — R079 Chest pain, unspecified: Secondary | ICD-10-CM | POA: Diagnosis present

## 2016-02-21 DIAGNOSIS — R0789 Other chest pain: Secondary | ICD-10-CM | POA: Insufficient documentation

## 2016-02-21 HISTORY — DX: Gastro-esophageal reflux disease without esophagitis: K21.9

## 2016-02-21 LAB — BASIC METABOLIC PANEL
ANION GAP: 15 (ref 5–15)
BUN: 8 mg/dL (ref 6–20)
CHLORIDE: 99 mmol/L — AB (ref 101–111)
CO2: 23 mmol/L (ref 22–32)
Calcium: 9.5 mg/dL (ref 8.9–10.3)
Creatinine, Ser: 0.82 mg/dL (ref 0.44–1.00)
GFR calc non Af Amer: >60 mL/min (ref >60)
Glucose, Bld: 336 mg/dL — ABNORMAL HIGH (ref 65–99)
POTASSIUM: 4.3 mmol/L (ref 3.5–5.1)
SODIUM: 137 mmol/L (ref 135–145)

## 2016-02-21 LAB — CBC
HEMATOCRIT: 38.6 % (ref 36.0–46.0)
HEMOGLOBIN: 12.6 g/dL (ref 12.0–15.0)
MCH: 27 pg (ref 26.0–34.0)
MCHC: 32.6 g/dL (ref 30.0–36.0)
MCV: 82.8 fL (ref 78.0–100.0)
PLATELETS: 352 10*3/uL (ref 150–400)
RBC: 4.66 MIL/uL (ref 3.87–5.11)
RDW: 13.3 % (ref 11.5–15.5)
WBC: 9.5 10*3/uL (ref 4.0–10.5)

## 2016-02-21 LAB — I-STAT TROPONIN, ED
TROPONIN I, POC: 0 ng/mL (ref 0.00–0.08)
Troponin i, poc: 0 ng/mL (ref 0.00–0.08)

## 2016-02-21 MED ORDER — GI COCKTAIL ~~LOC~~: 15.0000 mL | Freq: Three times a day (TID) | ORAL | Status: DC | PRN

## 2016-02-21 MED ORDER — GI COCKTAIL ~~LOC~~
30.0000 mL | Freq: Once | ORAL | Status: AC
  Administered 2016-02-21: 30 mL via ORAL
  Filled 2016-02-21: qty 30

## 2016-02-21 NOTE — ED Provider Notes (Signed)
CSN: [id number]     Arrival date & time 02/21/16  1233 History   First MD Initiated Contact with Patient 02/21/16 1636     Chief Complaint  Patient presents with  . chest burning      (Consider location/radiation/quality/duration/timing/severity/associated sxs/prior Treatment) HPI Comments: Patient is a 50 year old female with past medical history of diabetes, hypertension, and high cholesterol. She presents for evaluation of "burning" in her chest and upper abdomen which is been occurring intermittently for the past 3 days. She denies shortness of breath, nausea, or diaphoresis. She reports her symptoms are worse with eating, however does not feel similar to her prior reflux discomfort. She denies exertional symptoms. She denies fevers, chills, or cough.  Patient is a 50 y.o. female presenting with chest pain. The history is provided by the patient.  Chest Pain Pain location:  Epigastric Pain quality: burning   Pain radiates to:  Does not radiate Pain radiates to the back: yes   Pain severity:  Moderate Onset quality:  Sudden Duration:  3 days Timing:  Intermittent Progression:  Worsening Chronicity:  New Relieved by:  Nothing Worsened by:  Nothing tried Ineffective treatments:  None tried   Past Medical History  Diagnosis Date  . Hypertension   . Diabetes mellitus without complication ([place])   . GERD (gastroesophageal reflux disease)   . Depression    Past Surgical History  Procedure Laterality Date  . Abdominal hysterectomy     No family history on file. Social History  Substance Use Topics  . Smoking status: Current Every Day Smoker  . Smokeless tobacco: None  . Alcohol Use: No   OB History    No data available     Review of Systems  Cardiovascular: Positive for chest pain.  All other systems reviewed and are negative.     Allergies  Flagyl  Home Medications   Prior to Admission medications   Medication Sig Start Date End Date Taking? Authorizing  Provider  acetaminophen (TYLENOL) 500 MG tablet Take 1,000 mg by mouth every 6 (six) hours as needed for mild pain.    Historical Provider, MD  ARIPiprazole (ABILIFY) 15 MG tablet Take 15 mg by mouth daily.    Historical Provider, MD  aspirin EC 81 MG tablet Take 81 mg by mouth daily.    Historical Provider, MD  azithromycin (ZITHROMAX) 250 MG tablet Take 1 tablet (250 mg total) by mouth daily. Take first 2 tablets together, then 1 every day until finished. 02/01/16   Clayton Bibles, PA-C  benzonatate (TESSALON) 100 MG capsule Take 1 capsule (100 mg total) by mouth every 8 (eight) hours. 02/01/16   Clayton Bibles, PA-C  esomeprazole (NEXIUM) 40 MG capsule Take 40 mg by mouth daily at 12 noon.    Historical Provider, MD  FLUoxetine (PROZAC) 20 MG capsule Take 20 mg by mouth daily.    Historical Provider, MD  guaiFENesin (ROBITUSSIN) 100 MG/5ML liquid Take 5-10 mLs (100-200 mg total) by mouth every 4 (four) hours as needed for cough. 02/01/16   Clayton Bibles, PA-C  insulin aspart (NOVOLOG) 100 UNIT/ML injection Inject 0-5 Units into the skin 3 (three) times daily before meals. Sliding scale    Historical Provider, MD  insulin glargine (LANTUS) 100 UNIT/ML injection Inject 10 Units into the skin at bedtime.    Historical Provider, MD  metFORMIN (GLUCOPHAGE) 1000 MG tablet Take 1,000 mg by mouth daily with breakfast.    Historical Provider, MD  ondansetron (ZOFRAN) 4 MG tablet Take 1 tablet (  4 mg total) by mouth every 12 (twelve) hours as needed for nausea or vomiting. 01/09/16   Jarome Matin, MD  simvastatin (ZOCOR) 40 MG tablet Take 40 mg by mouth every evening.     Historical Provider, MD   BP 123/83 mmHg  Pulse 96  Temp(Src) 98.7 F (37.1 C) (Oral)  Resp 20  Ht 5\' 7"  (1.702 m)  Wt 243 lb (110.224 kg)  BMI 38.05 kg/m2  SpO2 100% Physical Exam  Constitutional: She is oriented to person, place, and time. She appears well-developed and well-nourished. No distress.  HENT:  Head: Normocephalic and atraumatic.   Neck: Normal range of motion. Neck supple.  Cardiovascular: Normal rate and regular rhythm.  Exam reveals no gallop and no friction rub.   No murmur heard. Pulmonary/Chest: Effort normal and breath sounds normal. No respiratory distress. She has no wheezes. She exhibits tenderness.  There is mild tenderness to the anterior chest wall.  Abdominal: Soft. Bowel sounds are normal. She exhibits no distension. There is tenderness. There is no rebound and no guarding.  There is mild tenderness to the epigastrium.  Musculoskeletal: Normal range of motion.  Neurological: She is alert and oriented to person, place, and time.  Skin: Skin is warm and dry. She is not diaphoretic.  Nursing note and vitals reviewed.   ED Course  Procedures (including critical care time) Labs Review Labs Reviewed  BASIC METABOLIC PANEL - Abnormal; Notable for the following:    Chloride 99 (*)    Glucose, Bld 336 (*)    All other components within normal limits  CBC  I-STAT TROPOININ, ED    Imaging Review Dg Chest 2 View  02/21/2016  CLINICAL DATA:  Chest pain. EXAM: CHEST  2 VIEW COMPARISON:  February 01, 2016 FINDINGS: The heart size and mediastinal contours are within normal limits. Both lungs are clear. The visualized skeletal structures are unremarkable. IMPRESSION: No active cardiopulmonary disease. Electronically Signed   By: Dorise Bullion III M.D   On: 02/21/2016 13:20   I have personally reviewed and evaluated these images and lab results as part of my medical decision-making.   EKG Interpretation   Date/Time:  Thursday February 21 2016 12:42:03 EDT Ventricular Rate:  108 PR Interval:  132 QRS Duration: 82 QT Interval:  348 QTC Calculation: 466 R Axis:   82 Text Interpretation:  Sinus tachycardia Cannot rule out Inferior infarct ,  age undetermined T wave abnormality, consider anterolateral ischemia  Abnormal ECG Confirmed by Tremel Setters  MD, Yoneko Talerico ([id number]) on 02/21/2016 4:38:48  PM     ED ECG  REPORT   Date: 02/21/2016  Rate: 87  Rhythm: normal sinus rhythm  QRS Axis: normal  Intervals: normal  ST/T Wave abnormalities: normal  Conduction Disutrbances:none  Narrative Interpretation:   Old EKG Reviewed: none available  I have personally reviewed the EKG tracing and agree with the computerized printout as noted.  MDM   Final diagnoses:  None    Patient is a 50 year old female with no prior cardiac history and normal heart cath 5 years ago. She presents for evaluation of burning in her chest and upper abdomen. Her initial EKG taken while tachycardic revealed nonspecific T wave abnormalities which corrected with improvement in her rate. Troponin 2 is negative and her symptoms have resolved with a GI cocktail. I highly suspect a GI etiology to her discomfort. She does admit to eating acidic foods lately which are likely the cause of this flareup. I will recommend she continue her antacids,  carefully adhered to a bland diet, and follow-up with cardiology to discuss the possibility of an outpatient stress test. She understands to return if her symptoms significantly worsen or change. I did discuss the EKG with Dr. Gwenlyn Found from cardiology who feels as though this was some sort of rate related phenomenon and agrees with my assessment that this is likely not from ischemia.    Veryl Speak, MD 02/21/16 [phone]

## 2016-02-21 NOTE — Discharge Instructions (Signed)
Continue your Nexium and Zantac as before.  GI cocktail as prescribed as needed if symptoms worsen.  Follow-up with cardiology to discuss a stress test. The contact information for the [facility] cardiology office has been provided in this discharge summary for you to call and arrange this appointment.   Return to the emergency department if your symptoms significantly worsen or change.   Nonspecific Chest Pain  Chest pain can be caused by many different conditions. There is always a chance that your pain could be related to something serious, such as a heart attack or a blood clot in your lungs. Chest pain can also be caused by conditions that are not life-threatening. If you have chest pain, it is very important to follow up with your health care provider. CAUSES  Chest pain can be caused by:  Heartburn.  Pneumonia or bronchitis.  Anxiety or stress.  Inflammation around your heart (pericarditis) or lung (pleuritis or pleurisy).  A blood clot in your lung.  A collapsed lung (pneumothorax). It can develop suddenly on its own (spontaneous pneumothorax) or from trauma to the chest.  Shingles infection (varicella-zoster virus).  Heart attack.  Damage to the bones, muscles, and cartilage that make up your chest wall. This can include:  Bruised bones due to injury.  Strained muscles or cartilage due to frequent or repeated coughing or overwork.  Fracture to one or more ribs.  Sore cartilage due to inflammation (costochondritis). RISK FACTORS  Risk factors for chest pain may include:  Activities that increase your risk for trauma or injury to your chest.  Respiratory infections or conditions that cause frequent coughing.  Medical conditions or overeating that can cause heartburn.  Heart disease or family history of heart disease.  Conditions or health behaviors that increase your risk of developing a blood clot.  Having had chicken pox (varicella zoster). SIGNS AND  SYMPTOMS Chest pain can feel like:  Burning or tingling on the surface of your chest or deep in your chest.  Crushing, pressure, aching, or squeezing pain.  Dull or sharp pain that is worse when you move, cough, or take a deep breath.  Pain that is also felt in your back, neck, shoulder, or arm, or pain that spreads to any of these areas. Your chest pain may come and go, or it may stay constant. DIAGNOSIS Lab tests or other studies may be needed to find the cause of your pain. Your health care provider may have you take a test called an ambulatory ECG (electrocardiogram). An ECG records your heartbeat patterns at the time the test is performed. You may also have other tests, such as:  Transthoracic echocardiogram (TTE). During echocardiography, sound waves are used to create a picture of all of the heart structures and to look at how blood flows through your heart.  Transesophageal echocardiogram (TEE).This is a more advanced imaging test that obtains images from inside your body. It allows your health care provider to see your heart in finer detail.  Cardiac monitoring. This allows your health care provider to monitor your heart rate and rhythm in real time.  Holter monitor. This is a portable device that records your heartbeat and can help to diagnose abnormal heartbeats. It allows your health care provider to track your heart activity for several days, if needed.  Stress tests. These can be done through exercise or by taking medicine that makes your heart beat more quickly.  Blood tests.  Imaging tests. TREATMENT  Your treatment depends on what  is causing your chest pain. Treatment may include:  Medicines. These may include:  Acid blockers for heartburn.  Anti-inflammatory medicine.  Pain medicine for inflammatory conditions.  Antibiotic medicine, if an infection is present.  Medicines to dissolve blood clots.  Medicines to treat coronary artery disease.  Supportive  care for conditions that do not require medicines. This may include:  Resting.  Applying heat or cold packs to injured areas.  Limiting activities until pain decreases. HOME CARE INSTRUCTIONS  If you were prescribed an antibiotic medicine, finish it all even if you start to feel better.  Avoid any activities that bring on chest pain.  Do not use any tobacco products, including cigarettes, chewing tobacco, or electronic cigarettes. If you need help quitting, ask your health care provider.  Do not drink alcohol.  Take medicines only as directed by your health care provider.  Keep all follow-up visits as directed by your health care provider. This is important. This includes any further testing if your chest pain does not go away.  If heartburn is the cause for your chest pain, you may be told to keep your head raised (elevated) while sleeping. This reduces the chance that acid will go from your stomach into your esophagus.  Make lifestyle changes as directed by your health care provider. These may include:  Getting regular exercise. Ask your health care provider to suggest some activities that are safe for you.  Eating a heart-healthy diet. A registered dietitian can help you to learn healthy eating options.  Maintaining a healthy weight.  Managing diabetes, if necessary.  Reducing stress. SEEK MEDICAL CARE IF:  Your chest pain does not go away after treatment.  You have a rash with blisters on your chest.  You have a fever. SEEK IMMEDIATE MEDICAL CARE IF:   Your chest pain is worse.  You have an increasing cough, or you cough up blood.  You have severe abdominal pain.  You have severe weakness.  You faint.  You have chills.  You have sudden, unexplained chest discomfort.  You have sudden, unexplained discomfort in your arms, back, neck, or jaw.  You have shortness of breath at any time.  You suddenly start to sweat, or your skin gets clammy.  You feel  nauseous or you vomit.  You suddenly feel light-headed or dizzy.  Your heart begins to beat quickly, or it feels like it is skipping beats. These symptoms may represent a serious problem that is an emergency. Do not wait to see if the symptoms will go away. Get medical help right away. Call your local emergency services (911 in the [place] Do not drive yourself to the hospital.   This information is not intended to replace advice given to you by your health care provider. Make sure you discuss any questions you have with your health care provider.   Document Released: 09/03/2005 Document Revised: 12/15/2014 Document Reviewed: 06/30/2014 Elsevier Interactive Patient Education [employer].

## 2016-02-21 NOTE — ED Notes (Signed)
Patient here with "acid reflux" since monday. States that she has ben taking her nexium with no relief. States that the discomfort is in her throat as well

## 2016-02-22 ENCOUNTER — Telehealth: Payer: Self-pay | Admitting: *Deleted

## 2016-02-22 NOTE — Telephone Encounter (Signed)
Pharmacy called for composition and ratio for GI cocktail.  ERCM consulted pharmacy to find Mallox 180 ml, Lidocaine 180 ml, Donnatal 90 ml.

## 2016-03-01 ENCOUNTER — Emergency Department (HOSPITAL_COMMUNITY): Payer: Medicaid Other

## 2016-03-01 ENCOUNTER — Encounter (HOSPITAL_COMMUNITY): Payer: Self-pay | Admitting: Emergency Medicine

## 2016-03-01 ENCOUNTER — Emergency Department (HOSPITAL_COMMUNITY)
Admission: EM | Admit: 2016-03-01 | Discharge: 2016-03-02 | Disposition: A | Discharge status: Home / Self Care | Payer: Medicaid Other | Attending: Emergency Medicine | Admitting: Emergency Medicine

## 2016-03-01 DIAGNOSIS — E876 Hypokalemia: Secondary | ICD-10-CM | POA: Diagnosis not present

## 2016-03-01 DIAGNOSIS — Z7982 Long term (current) use of aspirin: Secondary | ICD-10-CM | POA: Insufficient documentation

## 2016-03-01 DIAGNOSIS — R000 Tachycardia, unspecified: Secondary | ICD-10-CM | POA: Diagnosis not present

## 2016-03-01 DIAGNOSIS — F17210 Nicotine dependence, cigarettes, uncomplicated: Secondary | ICD-10-CM | POA: Diagnosis not present

## 2016-03-01 DIAGNOSIS — F418 Other specified anxiety disorders: Secondary | ICD-10-CM | POA: Insufficient documentation

## 2016-03-01 DIAGNOSIS — Z7984 Long term (current) use of oral hypoglycemic drugs: Secondary | ICD-10-CM | POA: Diagnosis not present

## 2016-03-01 DIAGNOSIS — Z79899 Other long term (current) drug therapy: Secondary | ICD-10-CM | POA: Insufficient documentation

## 2016-03-01 DIAGNOSIS — I10 Essential (primary) hypertension: Secondary | ICD-10-CM | POA: Diagnosis not present

## 2016-03-01 DIAGNOSIS — Z794 Long term (current) use of insulin: Secondary | ICD-10-CM | POA: Insufficient documentation

## 2016-03-01 DIAGNOSIS — K219 Gastro-esophageal reflux disease without esophagitis: Secondary | ICD-10-CM | POA: Insufficient documentation

## 2016-03-01 DIAGNOSIS — F331 Major depressive disorder, recurrent, moderate: Secondary | ICD-10-CM

## 2016-03-01 DIAGNOSIS — R739 Hyperglycemia, unspecified: Secondary | ICD-10-CM

## 2016-03-01 DIAGNOSIS — E1165 Type 2 diabetes mellitus with hyperglycemia: Secondary | ICD-10-CM | POA: Insufficient documentation

## 2016-03-01 DIAGNOSIS — F29 Unspecified psychosis not due to a substance or known physiological condition: Secondary | ICD-10-CM | POA: Insufficient documentation

## 2016-03-01 DIAGNOSIS — R4182 Altered mental status, unspecified: Secondary | ICD-10-CM

## 2016-03-01 LAB — COMPREHENSIVE METABOLIC PANEL
ALBUMIN: 3.6 g/dL (ref 3.5–5.0)
ALK PHOS: 76 U/L (ref 38–126)
ALT: 21 U/L (ref 14–54)
ANION GAP: 16 — AB (ref 5–15)
AST: 27 U/L (ref 15–41)
BILIRUBIN TOTAL: 0.4 mg/dL (ref 0.3–1.2)
BUN: 7 mg/dL (ref 6–20)
CALCIUM: 9.6 mg/dL (ref 8.9–10.3)
CO2: 21 mmol/L — ABNORMAL LOW (ref 22–32)
Chloride: 103 mmol/L (ref 101–111)
Creatinine, Ser: 0.98 mg/dL (ref 0.44–1.00)
GLUCOSE: 303 mg/dL — AB (ref 65–99)
POTASSIUM: 3.1 mmol/L — AB (ref 3.5–5.1)
Sodium: 140 mmol/L (ref 135–145)
TOTAL PROTEIN: 7 g/dL (ref 6.5–8.1)

## 2016-03-01 LAB — CBC WITH DIFFERENTIAL/PLATELET
BASOS ABS: 0 10*3/uL (ref 0.0–0.1)
BASOS PCT: 0 %
EOS ABS: 0 10*3/uL (ref 0.0–0.7)
Eosinophils Relative: 0 %
HCT: 37.5 % (ref 36.0–46.0)
HEMOGLOBIN: 12.1 g/dL (ref 12.0–15.0)
Lymphocytes Relative: 23 %
Lymphs Abs: 2.5 10*3/uL (ref 0.7–4.0)
MCH: 26.9 pg (ref 26.0–34.0)
MCHC: 32.3 g/dL (ref 30.0–36.0)
MCV: 83.3 fL (ref 78.0–100.0)
MONOS PCT: 7 %
Monocytes Absolute: 0.7 10*3/uL (ref 0.1–1.0)
NEUTROS PCT: 70 %
Neutro Abs: 7.8 10*3/uL — ABNORMAL HIGH (ref 1.7–7.7)
Platelets: 349 10*3/uL (ref 150–400)
RBC: 4.5 MIL/uL (ref 3.87–5.11)
RDW: 13.2 % (ref 11.5–15.5)
WBC: 11.1 10*3/uL — ABNORMAL HIGH (ref 4.0–10.5)

## 2016-03-01 LAB — URINALYSIS, ROUTINE W REFLEX MICROSCOPIC
Glucose, UA: >1000 mg/dL — AB
Hgb urine dipstick: NEGATIVE
Ketones, ur: 15 mg/dL — AB
LEUKOCYTES UA: NEGATIVE
Nitrite: NEGATIVE
PROTEIN: 30 mg/dL — AB
SPECIFIC GRAVITY, URINE: 1.038 — AB (ref 1.005–1.030)
pH: 5 (ref 5.0–8.0)

## 2016-03-01 LAB — RAPID URINE DRUG SCREEN, HOSP PERFORMED
AMPHETAMINES: NOT DETECTED
BENZODIAZEPINES: POSITIVE — AB
Barbiturates: POSITIVE — AB
COCAINE: NOT DETECTED
OPIATES: NOT DETECTED
TETRAHYDROCANNABINOL: NOT DETECTED

## 2016-03-01 LAB — URINE MICROSCOPIC-ADD ON

## 2016-03-01 LAB — SALICYLATE LEVEL

## 2016-03-01 LAB — ACETAMINOPHEN LEVEL: Acetaminophen (Tylenol), Serum: <10 ug/mL — ABNORMAL LOW (ref 10–30)

## 2016-03-01 LAB — ETHANOL

## 2016-03-01 MED ORDER — POTASSIUM CHLORIDE CRYS ER 20 MEQ PO TBCR
30.0000 meq | EXTENDED_RELEASE_TABLET | Freq: Two times a day (BID) | ORAL | Status: DC
  Administered 2016-03-01 – 2016-03-02 (×2): 30 meq via ORAL
  Filled 2016-03-01 (×2): qty 2

## 2016-03-01 MED ORDER — FLUOXETINE HCL 20 MG PO CAPS
20.0000 mg | ORAL_CAPSULE | Freq: Every day | ORAL | Status: DC
Start: 2016-03-02 — End: 2016-03-02
  Administered 2016-03-02: 20 mg via ORAL
  Filled 2016-03-01: qty 1

## 2016-03-01 MED ORDER — ARIPIPRAZOLE 10 MG PO TABS
15.0000 mg | ORAL_TABLET | Freq: Every day | ORAL | Status: DC
  Administered 2016-03-02: 15 mg via ORAL
  Filled 2016-03-01: qty 2

## 2016-03-01 MED ORDER — PANTOPRAZOLE SODIUM 40 MG PO TBEC
40.0000 mg | DELAYED_RELEASE_TABLET | Freq: Every day | ORAL | Status: DC
  Administered 2016-03-02: 40 mg via ORAL
  Filled 2016-03-01: qty 1

## 2016-03-01 MED ORDER — SODIUM CHLORIDE 0.9 % IV BOLUS (SEPSIS)
1000.0000 mL | Freq: Once | INTRAVENOUS | Status: AC
  Administered 2016-03-01: 1000 mL via INTRAVENOUS

## 2016-03-01 MED ORDER — IBUPROFEN 200 MG PO TABS: 600.0000 mg | ORAL_TABLET | Freq: Three times a day (TID) | ORAL | Status: DC | PRN

## 2016-03-01 MED ORDER — ASPIRIN EC 81 MG PO TBEC
81.0000 mg | DELAYED_RELEASE_TABLET | Freq: Every day | ORAL | Status: DC
  Administered 2016-03-02: 81 mg via ORAL
  Filled 2016-03-01: qty 1

## 2016-03-01 MED ORDER — INSULIN ASPART 100 UNIT/ML ~~LOC~~ SOLN
0.0000 [IU] | Freq: Three times a day (TID) | SUBCUTANEOUS | Status: DC
  Administered 2016-03-02: 5 [IU] via SUBCUTANEOUS
  Administered 2016-03-02: 2 [IU] via SUBCUTANEOUS
  Filled 2016-03-01: qty 1

## 2016-03-01 MED ORDER — LORAZEPAM 1 MG PO TABS
1.0000 mg | ORAL_TABLET | Freq: Three times a day (TID) | ORAL | Status: DC | PRN
  Administered 2016-03-01: 1 mg via ORAL
  Filled 2016-03-01: qty 1

## 2016-03-01 MED ORDER — ONDANSETRON HCL 4 MG PO TABS: 4.0000 mg | ORAL_TABLET | Freq: Three times a day (TID) | ORAL | Status: DC | PRN

## 2016-03-01 MED ORDER — INSULIN GLARGINE 100 UNIT/ML ~~LOC~~ SOLN
20.0000 [IU] | Freq: Every day | SUBCUTANEOUS | Status: DC
Start: 2016-03-01 — End: 2016-03-02
  Filled 2016-03-01 (×2): qty 0.2

## 2016-03-01 MED ORDER — SIMVASTATIN 40 MG PO TABS
40.0000 mg | ORAL_TABLET | Freq: Every evening | ORAL | Status: DC
  Administered 2016-03-01: 40 mg via ORAL
  Filled 2016-03-01 (×2): qty 1

## 2016-03-01 MED ORDER — INSULIN ASPART 100 UNIT/ML ~~LOC~~ SOLN: 0.0000 [IU] | Freq: Every day | SUBCUTANEOUS | Status: DC

## 2016-03-01 MED ORDER — ACETAMINOPHEN 325 MG PO TABS: 650.0000 mg | ORAL_TABLET | ORAL | Status: DC | PRN

## 2016-03-01 MED ORDER — METFORMIN HCL 500 MG PO TABS
1000.0000 mg | ORAL_TABLET | Freq: Every day | ORAL | Status: DC
  Administered 2016-03-02: 1000 mg via ORAL
  Filled 2016-03-01: qty 2

## 2016-03-01 NOTE — BH Assessment (Signed)
Received request for TTS consult. Contacted [facility] to arrange for tele-assessment but Pt is currently receiving CT. RN will contact TTS when Pt is ready for tele-assessment.   Orpah Greek Anson Fret, LPC, [facility], [facility] Triage Specialist [phone]

## 2016-03-01 NOTE — ED Notes (Signed)
Friends of daughter at the bedside.  Patient kicked them out of the room stating I don't know who you are.  Called and spoke with daughter with permission from patient.  Daughter unable to come to hospital at this time.  States I am 2 hours away.  IV found pulled out on the floor.

## 2016-03-01 NOTE — ED Provider Notes (Signed)
CSN: [id number]     Arrival date & time 03/01/16  2016 History   First MD Initiated Contact with Patient 03/01/16 2026     Chief Complaint  Patient presents with  . Altered Mental Status     Level V caveat due to altered mental status. Patient is a 50 y.o. female presenting with altered mental status. The history is provided by the patient.  Altered Mental Status  patient presents to the ER because she was infected with edema and that she met on Facebook. States edema infected [place] son and he is here in the hospital now. He is unable to provide much history. She has a history of diabetes that she on pills for.  Past Medical History  Diagnosis Date  . Hypertension   . Diabetes mellitus without complication ([place])   . GERD (gastroesophageal reflux disease)   . Depression   . Anxiety and depression    Past Surgical History  Procedure Laterality Date  . Abdominal hysterectomy     No family history on file. Social History  Substance Use Topics  . Smoking status: Current Every Day Smoker -- 1.00 packs/day    Types: Cigarettes  . Smokeless tobacco: None  . Alcohol Use: No   OB History    No data available     Review of Systems  Unable to perform ROS: Mental status change      Allergies  Flagyl  Home Medications   Prior to Admission medications   Medication Sig Start Date End Date Taking? Authorizing Provider  acetaminophen (TYLENOL) 500 MG tablet Take 1,000 mg by mouth every 6 (six) hours as needed for mild pain.   Yes Historical Provider, MD  Alum & Mag Hydroxide-Simeth (GI COCKTAIL) SUSP suspension Take 15 mLs by mouth 3 (three) times daily as needed for indigestion. Shake well. 02/21/16  Yes Veryl Speak, MD  ARIPiprazole (ABILIFY) 15 MG tablet Take 15 mg by mouth daily.   Yes Historical Provider, MD  aspirin EC 81 MG tablet Take 81 mg by mouth daily.   Yes Historical Provider, MD  esomeprazole (NEXIUM) 40 MG capsule Take 40 mg by mouth daily at 12 noon.   Yes  Historical Provider, MD  FLUoxetine (PROZAC) 20 MG capsule Take 20 mg by mouth daily.   Yes Historical Provider, MD  insulin aspart (NOVOLOG) 100 UNIT/ML injection Inject 0-5 Units into the skin 3 (three) times daily before meals. Sliding scale   Yes Historical Provider, MD  insulin glargine (LANTUS) 100 UNIT/ML injection Inject 20 Units into the skin at bedtime.    Yes Historical Provider, MD  metFORMIN (GLUCOPHAGE) 1000 MG tablet Take 1,000 mg by mouth daily with breakfast.   Yes Historical Provider, MD  ondansetron (ZOFRAN) 4 MG tablet Take 1 tablet (4 mg total) by mouth every 12 (twelve) hours as needed for nausea or vomiting. 01/09/16  Yes Jarome Matin, MD  benzonatate (TESSALON) 100 MG capsule Take 1 capsule (100 mg total) by mouth every 8 (eight) hours. 02/01/16   Clayton Bibles, PA-C  simvastatin (ZOCOR) 40 MG tablet Take 40 mg by mouth every evening. Reported on 03/01/2016    Historical Provider, MD   BP 140/85 mmHg  Pulse 112  Temp(Src) 99.4 F (37.4 C) (Oral)  Resp 17  Ht 5' 8" (1.727 m)  Wt 243 lb (110.224 kg)  BMI 36.96 kg/m2  SpO2 100% Physical Exam  Constitutional: She appears well-developed.  HENT:  Head: Atraumatic.  Neck: Neck supple.  Cardiovascular:  Mild tachycardia  Pulmonary/Chest: Effort normal.  Abdominal: Soft. There is no tenderness.  Neurological: She is alert.  Psychiatric:  Patient has somewhat pressured speech. States there are demons infecting her.    ED Course  Procedures (including critical care time) Labs Review Labs Reviewed  URINALYSIS, ROUTINE W REFLEX MICROSCOPIC (NOT AT [facility]) - Abnormal; Notable for the following:    Color, Urine AMBER (*)    Specific Gravity, Urine 1.038 (*)    Glucose, UA >1000 (*)    Bilirubin Urine SMALL (*)    Ketones, ur 15 (*)    Protein, ur 30 (*)    All other components within normal limits  URINE RAPID DRUG SCREEN, HOSP PERFORMED - Abnormal; Notable for the following:    Benzodiazepines POSITIVE (*)     Barbiturates POSITIVE (*)    All other components within normal limits  COMPREHENSIVE METABOLIC PANEL - Abnormal; Notable for the following:    Potassium 3.1 (*)    CO2 21 (*)    Glucose, Bld 303 (*)    Anion gap 16 (*)    All other components within normal limits  CBC WITH DIFFERENTIAL/PLATELET - Abnormal; Notable for the following:    WBC 11.1 (*)    Neutro Abs 7.8 (*)    All other components within normal limits  URINE MICROSCOPIC-ADD ON - Abnormal; Notable for the following:    Squamous Epithelial / LPF 6-30 (*)    Bacteria, UA MANY (*)    Casts HYALINE CASTS (*)    All other components within normal limits  ETHANOL    Imaging Review Dg Chest Portable 1 View  03/01/2016  CLINICAL DATA:  AMS. Hx of HTN and DM. Pt is a former smoker. Pt says she does not have any chest complaints at this time. EXAM: PORTABLE CHEST 1 VIEW COMPARISON:  02/25/2016 FINDINGS: Heart, mediastinum and hila are unremarkable. Lungs are clear. No pleural effusion or pneumothorax. Bony thorax is intact. IMPRESSION: No active disease. Electronically Signed   By: Lajean Manes M.D.   On: 03/01/2016 21:03   I have personally reviewed and evaluated these images and lab results as part of my medical decision-making.   EKG Interpretation   Date/Time:  Saturday March 01 2016 20:17:23 EDT Ventricular Rate:  113 PR Interval:  138 QRS Duration: 89 QT Interval:  309 QTC Calculation: 424 R Axis:   71 Text Interpretation:  Sinus tachycardia Borderline repolarization  abnormality Confirmed by Alvino Chapel  MD, Ovid Curd [phone]) on 03/01/2016  8:32:07 PM      MDM   Final diagnoses:  Psychosis, unspecified psychosis type  Hyperglycemia  Hypokalemia    Patient presents with altered mental status/psychosis. States the demon has been in her and her son. mild hyperglycemia without DKA. Denies psychiatric history but she is on Abilify and Prozac. She appears to medically cleared at this time. Will supplement potassium  orally. Started a home medications and to be seen by TTS.     Davonna Belling, MD 03/01/16 [id number]
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
## 2016-03-01 NOTE — ED Notes (Signed)
Brought in from home via EMS.  Per EMS neighbor called out reporting patient was in the parking lot rolling around screaming.  Pt noted to go from being lucid to reporting the "demon did this to me".  Rambling during triage.  Given Versed 2.5mg  IV en route.  Hx of anxiety and depression.

## 2016-03-02 DIAGNOSIS — F29 Unspecified psychosis not due to a substance or known physiological condition: Secondary | ICD-10-CM | POA: Insufficient documentation

## 2016-03-02 DIAGNOSIS — F331 Major depressive disorder, recurrent, moderate: Secondary | ICD-10-CM

## 2016-03-02 DIAGNOSIS — R4182 Altered mental status, unspecified: Secondary | ICD-10-CM

## 2016-03-02 LAB — CBG MONITORING, ED
GLUCOSE-CAPILLARY: 210 mg/dL — AB (ref 65–99)
Glucose-Capillary: 229 mg/dL — ABNORMAL HIGH (ref 65–99)
Glucose-Capillary: 147 mg/dL — ABNORMAL HIGH (ref 65–99)

## 2016-03-02 MED ORDER — POTASSIUM CHLORIDE CRYS ER 20 MEQ PO TBCR: 20.0000 meq | EXTENDED_RELEASE_TABLET | Freq: Every day | ORAL | Status: DC

## 2016-03-02 NOTE — ED Notes (Signed)
Pt on phone talking w/her daughter advising of plan to d/c to home.

## 2016-03-02 NOTE — ED Notes (Signed)
Pt up to bathroom twice with this RN, gait steady, alert and oriented, no lucid remarks or behaviors noted at this time.

## 2016-03-02 NOTE — ED Notes (Signed)
Pt provided with [place] Sandwich and water, per request.  States she was not able to finish her sandwich from earlier because "they told me not to eat anything that came out of that room".

## 2016-03-02 NOTE — ED Notes (Addendum)
Pt eating lunch. States her daughter is on her way to pick her up.

## 2016-03-02 NOTE — ED Provider Notes (Signed)
Patient is much better than how she presented last night. Awake, calm, no distress. Psych has seen and cleared for D/C. She will follow up with psych as outpatient and PCP for hyperglycemia. D/c with Kdur given mild hypokalemia  Sherwood Gambler, MD 03/02/16 1157

## 2016-03-02 NOTE — ED Notes (Signed)
Pt up to restroom.  Gait steady and even.   

## 2016-03-02 NOTE — ED Notes (Signed)
Pt sleeping at this time.

## 2016-03-02 NOTE — ED Notes (Addendum)
Pt chanting hyper-religious chants, states "praise be to God, glory be to God" in loud tone.  Then patient became quiet.  This RN then heard movement in room, went to check in on patient, patient back to eating her sandwich, but could not recall how long it had been since she received it.  Patient alert and oriented.

## 2016-03-02 NOTE — ED Notes (Signed)
Telepsych being performed. 

## 2016-03-02 NOTE — ED Notes (Signed)
Pt staring off when this RN went in to remove trash from meal.  This RN asked if she could clear her table and patient states "I guess, but I don't remember eating it".  This RN went to throw her trash away and patient states "where are you taking me?  To the bathroom?".  This RN asked "Do you need to go?".  Patient states "no, I'm asking, is that where you're taking me?".  This RN responded "I wasn't planning on taking you anywhere, I was going to leave you in here to rest".  Pt stated "oh" and went back to drinking her ice water.

## 2016-03-02 NOTE — ED Notes (Signed)
Pt came back to Pod C with no belongings.

## 2016-03-02 NOTE — ED Notes (Signed)
Patient lucid at this time.  Agrees to allow staff to give information to children as needed.

## 2016-03-02 NOTE — ED Notes (Signed)
Pt's daughter brought pt clothing to change into prior to leaving.

## 2016-03-02 NOTE — Consult Note (Signed)
Telepsych Consultation   Reason for Consult: Unspecified psychosis  Referring Physician: EDP Patient Identification: Ellen Avery MRN:  [id number] Principal Diagnosis: Altered mental status Diagnosis:   Patient Active Problem List   Diagnosis Date Noted  . Altered mental status [R41.82] 03/02/2016  . MDD (major depressive disorder), recurrent episode, moderate ([place]) [F33.1] 03/02/2016  . Psychoses [F29]     Total Time spent with patient: 30 minutes  Subjective:   Ellen Avery is a 50 y.o. female patient admitted to the [facility] after her neighbor called EMS to report the patient was rolling around the parking lot screaming. Patient states "I am doing fine. I do not know what happened yesterday either. I was just playing with my grandson. I have had a little anxiety lately but that is about it. I am living with my daughter and trying to get my own place. I just moved here in November of last year. I do not hear voices or see things. I do not think demons are out to get me. I have a history of depression. I do not have a Psychiatrist right now. I do not use medication not prescribed to me. I don't know what you mean about having a barbiturate in my urine. I just want to go home. I am feeling good now. I do not want to go into a hospital. My Primary Care Doctor has been adjusting my medications for high blood sugar. Maybe I just did not eat enough yesterday because my insulin has been increased recently."   HPI:    Ellen Avery is a 50 year old female with a history of depression that presented to the [facility] via EMS after a neighbor called to express that the patient was experiencing bizarre behaviors. The notes in epic indicate that the patient was making delusional comments about demons and appeared to be responding to internal stimuli. Ellen Avery is alert and oriented today during the assessment and able to logically participate in the assessment. She denies the presence of psychotic symptoms and several  times denies any suicidal thoughts. She is unable to explain why her urine was positive for barbiturates. Patient denies using any substances that are not prescribed to her. The patient reports being diagnosed with a mood disorder in the past such as Bipolar due to episodes of elevated mood and spending too much money. Ellen Avery does not appear to be experiencing manic type symptoms during the assessment. Patient is open to being set up with an outpatient psychiatrist as she has recently moved to the area. Her acute altered mental status appears to have resolved. Patient reports worrying that her blood sugar could be low but last night she was noted to be hyperglycemic on arrival to the ED. The patient does not appear to be a danger to herself and is motivated to pursue outpatient follow up.   Past Psychiatric History: Bipolar Disorder per patient report   Risk to Self: Suicidal Ideation: No Suicidal Intent: No Is patient at risk for suicide?: No Suicidal Plan?: No Access to Means: No What has been your use of drugs/alcohol within the last 12 months?: Pt denies How many times?: 1 (Pt reports one previous suicide attempt by overdose) Other Self Harm Risks: None identified Triggers for Past Attempts: Family contact, Other personal contacts Intentional Self Injurious Behavior: None Risk to Others: Homicidal Ideation: No Thoughts of Harm to Others: No Current Homicidal Intent: No Current Homicidal Plan: No Access to Homicidal Means: No Identified Victim: None History of harm to others?:  No Assessment of Violence: None Noted Violent Behavior Description: Pt denies history of violence Does patient have access to weapons?: No Criminal Charges Pending?: No Does patient have a court date: No Prior Inpatient Therapy: Prior Inpatient Therapy: Yes Prior Therapy Dates: Unknown Prior Therapy Facilty/Provider(s): Hospital in [place] Reason for Treatment: Suicide attempt Prior Outpatient Therapy: Prior  Outpatient Therapy: Yes Prior Therapy Dates: Unknown Prior Therapy Facilty/Provider(s): Provider in [place] Reason for Treatment: Mood disorder Does patient have an ACCT team?: No Does patient have Intensive In-House Services?  : No Does patient have [facility] services? : No Does patient have P4[facility] services?: No  Past Medical History:  Past Medical History  Diagnosis Date  . Hypertension   . Diabetes mellitus without complication ([place])   . GERD (gastroesophageal reflux disease)   . Depression   . Anxiety and depression     Past Surgical History  Procedure Laterality Date  . Abdominal hysterectomy     Family History: No family history on file. Family Psychiatric  History: Denies Social History:  History  Alcohol Use No     History  Drug Use No    Social History   Social History  . Marital Status: Divorced    Spouse Name: N/A  . Number of Children: N/A  . Years of Education: N/A   Social History Main Topics  . Smoking status: Current Every Day Smoker -- 1.00 packs/day    Types: Cigarettes  . Smokeless tobacco: None  . Alcohol Use: No  . Drug Use: No  . Sexual Activity: Not Asked   Other Topics Concern  . None   Social History Narrative   Additional Social History:    Allergies:   Allergies  Allergen Reactions  . Flagyl [Metronidazole] Hives and Itching    Benadryl     Labs:  Results for orders placed or performed during the hospital encounter of 03/01/16 (from the past 48 hour(s))  Comprehensive metabolic panel     Status: Abnormal   Collection Time: 03/01/16  9:08 PM  Result Value Ref Range   Sodium 140 135 - 145 mmol/L   Potassium 3.1 (L) 3.5 - 5.1 mmol/L   Chloride 103 101 - 111 mmol/L   CO2 21 (L) 22 - 32 mmol/L   Glucose, Bld 303 (H) 65 - 99 mg/dL   BUN 7 6 - 20 mg/dL   Creatinine, Ser 0.98 0.44 - 1.00 mg/dL   Calcium 9.6 8.9 - 10.3 mg/dL   Total Protein 7.0 6.5 - 8.1 g/dL   Albumin 3.6 3.5 - 5.0 g/dL   AST 27 15 - 41 U/L   ALT 21 14 - 54  U/L   Alkaline Phosphatase 76 38 - 126 U/L   Total Bilirubin 0.4 0.3 - 1.2 mg/dL   GFR calc non Af Amer >60 >60 mL/min   GFR calc Af Amer >60 >60 mL/min    Comment: (NOTE) The eGFR has been calculated using the CKD EPI equation. This calculation has not been validated in all clinical situations. eGFR's persistently <60 mL/min signify possible Chronic Kidney Disease.    Anion gap 16 (H) 5 - 15  CBC with Differential     Status: Abnormal   Collection Time: 03/01/16  9:08 PM  Result Value Ref Range   WBC 11.1 (H) 4.0 - 10.5 K/uL   RBC 4.50 3.87 - 5.11 MIL/uL   Hemoglobin 12.1 12.0 - 15.0 g/dL   HCT 37.5 36.0 - 46.0 %   MCV 83.3 78.0 - 100.0  fL   MCH 26.9 26.0 - 34.0 pg   MCHC 32.3 30.0 - 36.0 g/dL   RDW 13.2 11.5 - 15.5 %   Platelets 349 150 - 400 K/uL   Neutrophils Relative % 70 %   Neutro Abs 7.8 (H) 1.7 - 7.7 K/uL   Lymphocytes Relative 23 %   Lymphs Abs 2.5 0.7 - 4.0 K/uL   Monocytes Relative 7 %   Monocytes Absolute 0.7 0.1 - 1.0 K/uL   Eosinophils Relative 0 %   Eosinophils Absolute 0.0 0.0 - 0.7 K/uL   Basophils Relative 0 %   Basophils Absolute 0.0 0.0 - 0.1 K/uL  Urinalysis, Routine w reflex microscopic     Status: Abnormal   Collection Time: 03/01/16  9:27 PM  Result Value Ref Range   Color, Urine AMBER (A) YELLOW    Comment: BIOCHEMICALS MAY BE AFFECTED BY COLOR   APPearance CLEAR CLEAR   Specific Gravity, Urine 1.038 (H) 1.005 - 1.030   pH 5.0 5.0 - 8.0   Glucose, UA >1000 (A) NEGATIVE mg/dL   Hgb urine dipstick NEGATIVE NEGATIVE   Bilirubin Urine SMALL (A) NEGATIVE   Ketones, ur 15 (A) NEGATIVE mg/dL   Protein, ur 30 (A) NEGATIVE mg/dL   Nitrite NEGATIVE NEGATIVE   Leukocytes, UA NEGATIVE NEGATIVE  Urine rapid drug screen (hosp performed)     Status: Abnormal   Collection Time: 03/01/16  9:27 PM  Result Value Ref Range   Opiates NONE DETECTED NONE DETECTED   Cocaine NONE DETECTED NONE DETECTED   Benzodiazepines POSITIVE (A) NONE DETECTED   Amphetamines  NONE DETECTED NONE DETECTED   Tetrahydrocannabinol NONE DETECTED NONE DETECTED   Barbiturates POSITIVE (A) NONE DETECTED    Comment:        DRUG SCREEN FOR MEDICAL PURPOSES ONLY.  IF CONFIRMATION IS NEEDED FOR ANY PURPOSE, NOTIFY LAB WITHIN 5 DAYS.        LOWEST DETECTABLE LIMITS FOR URINE DRUG SCREEN Drug Class       Cutoff (ng/mL) Amphetamine      1000 Barbiturate      200 Benzodiazepine   166 Tricyclics       063 Opiates          300 Cocaine          300 THC              50   Urine microscopic-add on     Status: Abnormal   Collection Time: 03/01/16  9:27 PM  Result Value Ref Range   Squamous Epithelial / LPF 6-30 (A) NONE SEEN   WBC, UA 0-5 0 - 5 WBC/hpf   RBC / HPF 0-5 0 - 5 RBC/hpf   Bacteria, UA MANY (A) NONE SEEN   Casts HYALINE CASTS (A) NEGATIVE   Urine-Other YEAST PRESENT   Ethanol     Status: None   Collection Time: 03/01/16  9:43 PM  Result Value Ref Range   Alcohol, Ethyl (B) <5 <5 mg/dL    Comment:        LOWEST DETECTABLE LIMIT FOR SERUM ALCOHOL IS 5 mg/dL FOR MEDICAL PURPOSES ONLY   Salicylate level     Status: None   Collection Time: 03/01/16  9:43 PM  Result Value Ref Range   Salicylate Lvl <0.1 2.8 - 30.0 mg/dL  Acetaminophen level     Status: Abnormal   Collection Time: 03/01/16  9:43 PM  Result Value Ref Range   Acetaminophen (Tylenol), Serum <10 (L) 10 - 30  ug/mL    Comment:        THERAPEUTIC CONCENTRATIONS VARY SIGNIFICANTLY. A RANGE OF 10-30 ug/mL MAY BE AN EFFECTIVE CONCENTRATION FOR MANY PATIENTS. HOWEVER, SOME ARE BEST TREATED AT CONCENTRATIONS OUTSIDE THIS RANGE. ACETAMINOPHEN CONCENTRATIONS >150 ug/mL AT 4 HOURS AFTER INGESTION AND >50 ug/mL AT 12 HOURS AFTER INGESTION ARE OFTEN ASSOCIATED WITH TOXIC REACTIONS.   CBG monitoring, ED     Status: Abnormal   Collection Time: 03/01/16 11:38 PM  Result Value Ref Range   Glucose-Capillary 210 (H) 65 - 99 mg/dL  CBG monitoring, ED     Status: Abnormal   Collection Time: 03/02/16   7:47 AM  Result Value Ref Range   Glucose-Capillary 229 (H) 65 - 99 mg/dL    Current Facility-Administered Medications  Medication Dose Route Frequency Provider Last Rate Last Dose  . acetaminophen (TYLENOL) tablet 650 mg  650 mg Oral Q4H PRN Benjiman Core, MD      . ARIPiprazole (ABILIFY) tablet 15 mg  15 mg Oral Daily Benjiman Core, MD   15 mg at 03/02/16 1105  . aspirin EC tablet 81 mg  81 mg Oral Daily Benjiman Core, MD   81 mg at 03/02/16 1106  . FLUoxetine (PROZAC) capsule 20 mg  20 mg Oral Daily Benjiman Core, MD   20 mg at 03/02/16 1105  . ibuprofen (ADVIL,MOTRIN) tablet 600 mg  600 mg Oral Q8H PRN Benjiman Core, MD      . insulin aspart (novoLOG) injection 0-15 Units  0-15 Units Subcutaneous TID WC Benjiman Core, MD   5 Units at 03/02/16 0754  . insulin aspart (novoLOG) injection 0-5 Units  0-5 Units Subcutaneous QHS Benjiman Core, MD   0 Units at 03/01/16 2344  . insulin glargine (LANTUS) injection 20 Units  20 Units Subcutaneous QHS Benjiman Core, MD   20 Units at 03/01/16 2344  . LORazepam (ATIVAN) tablet 1 mg  1 mg Oral Q8H PRN Benjiman Core, MD   1 mg at 03/01/16 2336  . metFORMIN (GLUCOPHAGE) tablet 1,000 mg  1,000 mg Oral Q breakfast Benjiman Core, MD   1,000 mg at 03/02/16 0753  . ondansetron (ZOFRAN) tablet 4 mg  4 mg Oral Q8H PRN Benjiman Core, MD      . pantoprazole (PROTONIX) EC tablet 40 mg  40 mg Oral Daily Benjiman Core, MD   40 mg at 03/02/16 1106  . potassium chloride SA (K-DUR,KLOR-CON) CR tablet 30 mEq  30 mEq Oral BID Benjiman Core, MD   30 mEq at 03/02/16 1106  . simvastatin (ZOCOR) tablet 40 mg  40 mg Oral QPM Benjiman Core, MD   40 mg at 03/01/16 2336   Current Outpatient Prescriptions  Medication Sig Dispense Refill  . acetaminophen (TYLENOL) 500 MG tablet Take 1,000 mg by mouth every 6 (six) hours as needed for mild pain.    Marland Kitchen Alum & Mag Hydroxide-Simeth (GI COCKTAIL) SUSP suspension Take 15 mLs by mouth 3 (three)  times daily as needed for indigestion. Shake well. 150 mL 0  . ARIPiprazole (ABILIFY) 15 MG tablet Take 15 mg by mouth daily.    Marland Kitchen aspirin EC 81 MG tablet Take 81 mg by mouth daily.    Marland Kitchen esomeprazole (NEXIUM) 40 MG capsule Take 40 mg by mouth daily at 12 noon.    Marland Kitchen FLUoxetine (PROZAC) 20 MG capsule Take 20 mg by mouth daily.    . insulin aspart (NOVOLOG) 100 UNIT/ML injection Inject 0-5 Units into the skin 3 (three) times daily before meals.  Sliding scale    . insulin glargine (LANTUS) 100 UNIT/ML injection Inject 20 Units into the skin at bedtime.     . metFORMIN (GLUCOPHAGE) 1000 MG tablet Take 1,000 mg by mouth daily with breakfast.    . ondansetron (ZOFRAN) 4 MG tablet Take 1 tablet (4 mg total) by mouth every 12 (twelve) hours as needed for nausea or vomiting. 4 tablet 0  . benzonatate (TESSALON) 100 MG capsule Take 1 capsule (100 mg total) by mouth every 8 (eight) hours. 21 capsule 0  . simvastatin (ZOCOR) 40 MG tablet Take 40 mg by mouth every evening. Reported on 03/01/2016      Musculoskeletal: Unable to assess via camera  Psychiatric Specialty Exam: Review of Systems  Constitutional: Negative.   HENT: Negative.   Eyes: Negative.   Respiratory: Negative.   Cardiovascular: Negative.   Gastrointestinal: Negative.   Genitourinary: Negative.   Musculoskeletal: Negative.   Skin: Negative.   Neurological: Negative.   Endo/Heme/Allergies: Negative.   Psychiatric/Behavioral: Negative for depression, suicidal ideas, hallucinations, memory loss and substance abuse. The patient is not nervous/anxious and does not have insomnia.     Blood pressure 112/44, pulse 80, temperature 98.6 F (37 C), temperature source Oral, resp. rate 18, height '5\' 8"'$  (1.727 m), weight 110.224 kg (243 lb), SpO2 100 %.Body mass index is 36.96 kg/(m^2).  General Appearance: Casual  Eye Contact::  Good  Speech:  Clear and Coherent  Volume:  Normal  Mood:  Euthymic  Affect:  Appropriate  Thought Process:  Goal  Directed and Intact  Orientation:  Full (Time, Place, and Person)  Thought Content:  WDL  Suicidal Thoughts:  No  Homicidal Thoughts:  No  Memory:  Immediate;   Fair Recent;   Fair Remote;   Fair  Judgement:  Fair  Insight:  Shallow  Psychomotor Activity:  Normal  Concentration:  Good  Recall:  Fair Trouble remembering details from yesterday  Fund of Knowledge:Good  Language: Good  Akathisia:  No  Handed:  Right  AIMS (if indicated):     Assets:  Communication Skills Desire for Improvement Financial Resources/Insurance Leisure Time Physical Health Resilience Social Support  ADL's:  Intact  Cognition: WNL  Sleep:      Treatment Plan Summary: Patient to be given resources to establish care with outpatient psychiatric provider.   Disposition: No evidence of imminent risk to self or others at present.   Patient does not meet criteria for psychiatric inpatient admission. Supportive therapy provided about ongoing stressors. Discussed crisis plan, support from social network, calling 911, coming to the Emergency Department, and calling Suicide Hotline.  Ellen Shiley, NP 03/02/2016 11:29 AM  Notes reviewed and agree with plan.

## 2016-03-02 NOTE — ED Notes (Signed)
Pt states she does not recall what occurred last pm. Denies hx inpt psych tx. States she remembers being w/her 7-y-o grandson yesterday evening and nothing after that. States feels fine this am - just sleepy. Pt alert, oriented, cooperative. Pt noted to be wearing hospital gown.

## 2016-06-09 ENCOUNTER — Encounter (HOSPITAL_COMMUNITY): Payer: Self-pay | Admitting: Family Medicine

## 2016-06-09 ENCOUNTER — Emergency Department (HOSPITAL_COMMUNITY)
Admission: EM | Admit: 2016-06-09 | Discharge: 2016-06-09 | Disposition: A | Discharge status: Home / Self Care | Payer: Medicaid Other | Attending: Emergency Medicine | Admitting: Emergency Medicine

## 2016-06-09 DIAGNOSIS — M62830 Muscle spasm of back: Secondary | ICD-10-CM | POA: Insufficient documentation

## 2016-06-09 DIAGNOSIS — Z79899 Other long term (current) drug therapy: Secondary | ICD-10-CM | POA: Insufficient documentation

## 2016-06-09 DIAGNOSIS — Z7984 Long term (current) use of oral hypoglycemic drugs: Secondary | ICD-10-CM | POA: Diagnosis not present

## 2016-06-09 DIAGNOSIS — F17210 Nicotine dependence, cigarettes, uncomplicated: Secondary | ICD-10-CM | POA: Insufficient documentation

## 2016-06-09 DIAGNOSIS — Z794 Long term (current) use of insulin: Secondary | ICD-10-CM | POA: Insufficient documentation

## 2016-06-09 DIAGNOSIS — I10 Essential (primary) hypertension: Secondary | ICD-10-CM | POA: Insufficient documentation

## 2016-06-09 DIAGNOSIS — E119 Type 2 diabetes mellitus without complications: Secondary | ICD-10-CM | POA: Insufficient documentation

## 2016-06-09 DIAGNOSIS — R0981 Nasal congestion: Secondary | ICD-10-CM | POA: Diagnosis present

## 2016-06-09 DIAGNOSIS — Z7982 Long term (current) use of aspirin: Secondary | ICD-10-CM | POA: Insufficient documentation

## 2016-06-09 MED ORDER — DEXTROMETHORPHAN-GUAIFENESIN 10-200 MG PO CAPS: 2.0000 | ORAL_CAPSULE | Freq: Four times a day (QID) | ORAL | Status: DC

## 2016-06-09 MED ORDER — CYCLOBENZAPRINE HCL 5 MG PO TABS: 5.0000 mg | ORAL_TABLET | Freq: Three times a day (TID) | ORAL | Status: DC | PRN

## 2016-06-09 NOTE — ED Notes (Signed)
Patient verbalized understanding of discharge instructions and denies any further needs or questions at this time. VS stable. Patient ambulatory with steady gait, declined wheelchair. RN escorted patient to ED entrance.

## 2016-06-09 NOTE — ED Notes (Signed)
Pt here for facial and sinus pressure with ear pain and feeling of water in ears. sts x 1 week.

## 2016-06-09 NOTE — Discharge Instructions (Signed)
Follow up with your doctor.  Return here as needed 

## 2016-06-09 NOTE — ED Provider Notes (Signed)
History  By signing my name below, I, Ellen Avery, attest that this documentation has been prepared under the direction and in the presence of Ellen Baller, FNP Electronically Signed: Bea Avery, ED Scribe. 06/09/2016. 6:26 PM.  Chief Complaint  Patient presents with  . Facial Pain  . Nasal Congestion   The history is provided by the patient and medical records. No language interpreter was used.    HPI Comments:  Ellen Avery is an obese 50 y.o. female who presents to the Emergency Department complaining of sinus pressure and pain that began four days ago. She states the symptoms started with a diffuse HA. She then began experiencing nasal congestions and bilateral otalgia. She states feels as if there is water in her ears. Pt also reports some bilateral shoulder soreness and reports PMHx of a bulging disc of the cervical spine. She just finished a 10 day course of steroids for treatment of the disc that she completed last week. She denies any known sick contacts. She has taken Tylenol with some temporary relief of the HA. Pt states she has been taking Zyrtec with minimal relief of the symptoms so she switched to Allegra with similar results. She denies modifying factors. She denies cough, fever, chills. PMHx of HTN and DM. She reports finishing a course of Bactrim for a UTI about 5 days ago.  Past Medical History  Diagnosis Date  . Hypertension   . Diabetes mellitus without complication ([place])   . GERD (gastroesophageal reflux disease)   . Depression   . Anxiety and depression    Past Surgical History  Procedure Laterality Date  . Abdominal hysterectomy     History reviewed. No pertinent family history. Social History  Substance Use Topics  . Smoking status: Current Every Day Smoker -- 1.00 packs/day    Types: Cigarettes  . Smokeless tobacco: None  . Alcohol Use: No   OB History    No data available     Review of Systems  Constitutional: Negative for fever and  chills.  HENT: Positive for ear pain and sinus pressure.   Respiratory: Negative for cough.   Musculoskeletal: Positive for myalgias.  Neurological: Positive for headaches.  All other systems reviewed and are negative.   Allergies  Flagyl  Home Medications   Prior to Admission medications   Medication Sig Start Date End Date Taking? Authorizing Provider  acetaminophen (TYLENOL) 500 MG tablet Take 1,000 mg by mouth every 6 (six) hours as needed for mild pain.    Historical Provider, MD  Alum & Mag Hydroxide-Simeth (GI COCKTAIL) SUSP suspension Take 15 mLs by mouth 3 (three) times daily as needed for indigestion. Shake well. 02/21/16   Veryl Speak, MD  ARIPiprazole (ABILIFY) 15 MG tablet Take 15 mg by mouth daily.    Historical Provider, MD  aspirin EC 81 MG tablet Take 81 mg by mouth daily.    Historical Provider, MD  benzonatate (TESSALON) 100 MG capsule Take 1 capsule (100 mg total) by mouth every 8 (eight) hours. 02/01/16   Clayton Bibles, PA-C  cyclobenzaprine (FLEXERIL) 5 MG tablet Take 1 tablet (5 mg total) by mouth 3 (three) times daily as needed for muscle spasms. 06/09/16   Jordynn Perrier Bunnie Pion, NP  Dextromethorphan-Guaifenesin 10-200 MG CAPS Take 2 tablets by mouth every 6 (six) hours. 06/09/16   Mayur Duman Bunnie Pion, NP  esomeprazole (NEXIUM) 40 MG capsule Take 40 mg by mouth daily at 12 noon.    Historical Provider, MD  FLUoxetine (PROZAC) 20 MG  capsule Take 20 mg by mouth daily.    Historical Provider, MD  insulin aspart (NOVOLOG) 100 UNIT/ML injection Inject 0-5 Units into the skin 3 (three) times daily before meals. Sliding scale    Historical Provider, MD  insulin glargine (LANTUS) 100 UNIT/ML injection Inject 20 Units into the skin at bedtime.     Historical Provider, MD  metFORMIN (GLUCOPHAGE) 1000 MG tablet Take 1,000 mg by mouth daily with breakfast.    Historical Provider, MD  ondansetron (ZOFRAN) 4 MG tablet Take 1 tablet (4 mg total) by mouth every 12 (twelve) hours as needed for nausea or  vomiting. 01/09/16   Jarome Matin, MD  potassium chloride SA (K-DUR,KLOR-CON) 20 MEQ tablet Take 1 tablet (20 mEq total) by mouth daily. 03/02/16   Sherwood Gambler, MD  simvastatin (ZOCOR) 40 MG tablet Take 40 mg by mouth every evening. Reported on 03/01/2016    Historical Provider, MD   Triage Vitals: BP 122/81 mmHg  Pulse 82  Temp(Src) 98.3 F (36.8 C)  Resp 18  SpO2 98% Physical Exam  Constitutional: She is oriented to person, place, and time. She appears well-developed and well-nourished.  HENT:  Right Ear: Tympanic membrane and ear canal normal.  Left Ear: Tympanic membrane and ear canal normal.  Mouth/Throat: Uvula is midline, oropharynx is clear and moist and mucous membranes are normal.  Eyes: EOM are normal. Pupils are equal, round, and reactive to light.  Sclera clear bilaterally.  Neck: Normal range of motion. Neck supple.  Cardiovascular: Normal rate, regular rhythm and normal heart sounds.  Exam reveals no gallop and no friction rub.   No murmur heard. Pulmonary/Chest: Effort normal and breath sounds normal. No respiratory distress. She has no wheezes. She has no rales.  Abdominal: Soft. There is no tenderness.  Musculoskeletal: Normal range of motion.  No cervical spine tenderness. Muscle spasm noted to thoracic region.  Neurological: She is alert and oriented to person, place, and time. No cranial nerve deficit.  Skin: Skin is warm and dry.  Nursing note and vitals reviewed.   ED Course  Procedures (including critical care time) DIAGNOSTIC STUDIES: Oxygen Saturation is 98% on RA, normal by my interpretation.   COORDINATION OF CARE: 6:22 PM- Will prescribe a decongestant and muscle relaxer. Pt verbalizes understanding and agrees to plan. MDM  50 y.o. female with sinus pressure and nasal congestion and also with muscle spasm to upper back stable for d/c without fever, neck tenderness and does not appear toxic. Will treat for sinus congestion and muscle spasm.  Discussed  with the patient and all questioned fully answered. She will return if any problems arise.   Final diagnoses:  Sinus congestion  Muscle spasm of back   I personally performed the services described in this documentation, which was scribed in my presence. The recorded information has been reviewed and is accurate.     [place], NP 06/10/16 1953  [name], MD 06/11/16 [phone]

## 2016-06-09 NOTE — ED Notes (Signed)
NP to see and assess patient before RN assessment and has pt up for discharge at this time. See NP note.

## 2016-07-15 ENCOUNTER — Encounter (HOSPITAL_COMMUNITY): Payer: Self-pay

## 2016-07-15 ENCOUNTER — Emergency Department (HOSPITAL_COMMUNITY)
Admission: EM | Admit: 2016-07-15 | Discharge: 2016-07-15 | Disposition: A | Discharge status: Home / Self Care | Payer: Medicaid Other | Attending: Emergency Medicine | Admitting: Emergency Medicine

## 2016-07-15 DIAGNOSIS — Z7982 Long term (current) use of aspirin: Secondary | ICD-10-CM | POA: Diagnosis not present

## 2016-07-15 DIAGNOSIS — I10 Essential (primary) hypertension: Secondary | ICD-10-CM | POA: Insufficient documentation

## 2016-07-15 DIAGNOSIS — R42 Dizziness and giddiness: Secondary | ICD-10-CM | POA: Diagnosis present

## 2016-07-15 DIAGNOSIS — J019 Acute sinusitis, unspecified: Secondary | ICD-10-CM

## 2016-07-15 DIAGNOSIS — H8303 Labyrinthitis, bilateral: Secondary | ICD-10-CM | POA: Diagnosis not present

## 2016-07-15 DIAGNOSIS — Z794 Long term (current) use of insulin: Secondary | ICD-10-CM | POA: Insufficient documentation

## 2016-07-15 DIAGNOSIS — F17210 Nicotine dependence, cigarettes, uncomplicated: Secondary | ICD-10-CM | POA: Insufficient documentation

## 2016-07-15 DIAGNOSIS — Z7984 Long term (current) use of oral hypoglycemic drugs: Secondary | ICD-10-CM | POA: Diagnosis not present

## 2016-07-15 DIAGNOSIS — E119 Type 2 diabetes mellitus without complications: Secondary | ICD-10-CM | POA: Diagnosis not present

## 2016-07-15 MED ORDER — AMOXICILLIN-POT CLAVULANATE 875-125 MG PO TABS: 1.0000 | ORAL_TABLET | Freq: Two times a day (BID) | ORAL | 0 refills | Status: DC

## 2016-07-15 MED ORDER — MECLIZINE HCL 25 MG PO TABS
25.0000 mg | ORAL_TABLET | Freq: Once | ORAL | Status: AC
  Administered 2016-07-15: 25 mg via ORAL
  Filled 2016-07-15: qty 1

## 2016-07-15 MED ORDER — AMOXICILLIN-POT CLAVULANATE 875-125 MG PO TABS
1.0000 | ORAL_TABLET | Freq: Once | ORAL | Status: AC
  Administered 2016-07-15: 1 via ORAL
  Filled 2016-07-15: qty 1

## 2016-07-15 MED ORDER — MECLIZINE HCL 25 MG PO TABS: 25.0000 mg | ORAL_TABLET | Freq: Three times a day (TID) | ORAL | 0 refills | Status: DC | PRN

## 2016-07-15 NOTE — ED Provider Notes (Signed)
[facility] DEPT Provider Note   CSN: [id number] Arrival date & time: 07/15/16  1749  First Provider Contact:  First MD Initiated Contact with Patient 07/15/16 2205        History   Chief Complaint Chief Complaint  Patient presents with  . Dizziness    HPI Ellen Avery is a 50 y.o. female with a pmhx of DM , HTN, HLD who presents to the ED today c/o nasal congestion, ear fullness and facial pain x 10 days. Pt states that she has chronic allergies so she takes Zyrtec and Singulair daily. However for the last 10 days she has had increased facial pain and a sensation of her ears being very full. She states she physically need to be cleaned out her trained. She has associated headache, nasal congestion.  Patient also reports feeling as if the room is spinning and dizziness. This sensation began 2 days ago. Patient also states that she does nasal rinsing daily. She denies any fevers or chills, neck pain, cough.HPI  Past Medical History:  Diagnosis Date  . Anxiety and depression   . Depression   . Diabetes mellitus without complication ([place])   . GERD (gastroesophageal reflux disease)   . Hypertension     Patient Active Problem List   Diagnosis Date Noted  . Altered mental status 03/02/2016  . MDD (major depressive disorder), recurrent episode, moderate ([place]) 03/02/2016  . Psychoses     Past Surgical History:  Procedure Laterality Date  . ABDOMINAL HYSTERECTOMY      OB History    No data available       Home Medications    Prior to Admission medications   Medication Sig Start Date End Date Taking? Authorizing Provider  acetaminophen (TYLENOL) 500 MG tablet Take 1,000 mg by mouth every 6 (six) hours as needed for mild pain.    Historical Provider, MD  Alum & Mag Hydroxide-Simeth (GI COCKTAIL) SUSP suspension Take 15 mLs by mouth 3 (three) times daily as needed for indigestion. Shake well. 02/21/16   Veryl Speak, MD  ARIPiprazole (ABILIFY) 15 MG tablet Take 15 mg by  mouth daily.    Historical Provider, MD  aspirin EC 81 MG tablet Take 81 mg by mouth daily.    Historical Provider, MD  benzonatate (TESSALON) 100 MG capsule Take 1 capsule (100 mg total) by mouth every 8 (eight) hours. 02/01/16   Clayton Bibles, PA-C  cyclobenzaprine (FLEXERIL) 5 MG tablet Take 1 tablet (5 mg total) by mouth 3 (three) times daily as needed for muscle spasms. 06/09/16   [name], NP  Dextromethorphan-Guaifenesin 10-200 MG CAPS Take 2 tablets by mouth every 6 (six) hours. 06/09/16   [name], NP  esomeprazole (NEXIUM) 40 MG capsule Take 40 mg by mouth daily at 12 noon.    Historical Provider, MD  FLUoxetine (PROZAC) 20 MG capsule Take 20 mg by mouth daily.    Historical Provider, MD  insulin aspart (NOVOLOG) 100 UNIT/ML injection Inject 0-5 Units into the skin 3 (three) times daily before meals. Sliding scale    Historical Provider, MD  insulin glargine (LANTUS) 100 UNIT/ML injection Inject 20 Units into the skin at bedtime.     Historical Provider, MD  metFORMIN (GLUCOPHAGE) 1000 MG tablet Take 1,000 mg by mouth daily with breakfast.    Historical Provider, MD  ondansetron (ZOFRAN) 4 MG tablet Take 1 tablet (4 mg total) by mouth every 12 (twelve) hours as needed for nausea or vomiting. 01/09/16   Cindee Salt  Proulx, MD  potassium chloride SA (K-DUR,KLOR-CON) 20 MEQ tablet Take 1 tablet (20 mEq total) by mouth daily. 03/02/16   Sherwood Gambler, MD  simvastatin (ZOCOR) 40 MG tablet Take 40 mg by mouth every evening. Reported on 03/01/2016    Historical Provider, MD    Family History History reviewed. No pertinent family history.  Social History Social History  Substance Use Topics  . Smoking status: Current Every Day Smoker    Packs/day: 1.00    Types: Cigarettes  . Smokeless tobacco: Never Used  . Alcohol use No     Allergies   Flagyl [metronidazole]   Review of Systems Review of Systems  All other systems reviewed and are negative.    Physical Exam Updated Vital Signs BP  109/76   Pulse 75   Temp 98.4 F (36.9 C) (Oral)   Resp 16   Ht 5\' 7"  (1.702 m)   Wt 105.7 kg   SpO2 97%   BMI 36.50 kg/m   Physical Exam  Constitutional: She is oriented to person, place, and time. She appears well-developed and well-nourished. No distress.  HENT:  Head: Normocephalic and atraumatic.  Right Ear: Tympanic membrane normal.  Left Ear: Tympanic membrane normal.  Nose: Mucosal edema and rhinorrhea present. Right sinus exhibits maxillary sinus tenderness and frontal sinus tenderness. Left sinus exhibits maxillary sinus tenderness and frontal sinus tenderness.  Mouth/Throat: Uvula is midline, oropharynx is clear and moist and mucous membranes are normal. No oropharyngeal exudate, posterior oropharyngeal edema, posterior oropharyngeal erythema or tonsillar abscesses. No tonsillar exudate.  Fluid behind ears b/l. NO cerumen impaction.  Eyes: Conjunctivae and EOM are normal. Pupils are equal, round, and reactive to light. Right eye exhibits no discharge. Left eye exhibits no discharge. No scleral icterus.  Cardiovascular: Normal rate, regular rhythm, normal heart sounds and intact distal pulses.  Exam reveals no gallop and no friction rub.   No murmur heard. Pulmonary/Chest: Effort normal and breath sounds normal. No respiratory distress. She has no wheezes. She has no rales. She exhibits no tenderness.  Abdominal: Soft. She exhibits no distension. There is no tenderness. There is no guarding.  Musculoskeletal: Normal range of motion. She exhibits no edema.  Neurological: She is alert and oriented to person, place, and time.  Skin: Skin is warm and dry. No rash noted. She is not diaphoretic. No erythema. No pallor.  Psychiatric: She has a normal mood and affect. Her behavior is normal.  Nursing note and vitals reviewed.    ED Treatments / Results  Labs (all labs ordered are listed, but only abnormal results are displayed) Labs Reviewed - No data to display  EKG  EKG  Interpretation None       Radiology No results found.  Procedures Procedures (including critical care time)  Medications Ordered in ED Medications  amoxicillin-clavulanate (AUGMENTIN) 875-125 MG per tablet 1 tablet (1 tablet Oral Given 07/15/16 2221)  meclizine (ANTIVERT) tablet 25 mg (25 mg Oral Given 07/15/16 2221)     Initial Impression / Assessment and Plan / ED Course  I have reviewed the triage vital signs and the nursing notes.  Pertinent labs & imaging results that were available during my care of the patient were reviewed by me and considered in my medical decision making (see chart for details).  Clinical Course    Patient complaining of Severe symptoms of sinusitis that have been present for greater than 10 days with purulent nasal discharge and maxillary sinus pain.  Concern for acute bacterial rhinosinusitis.  Patient discharged with Augmentin.  Pt also reporting a feeling of the room spinning, likely due to labyrinthitis secondary to sinus infection. Pt given meclizine in ED with significant symptomatic improvement. RX for meclizine given. Instructions given for warm saline nasal wash and recommendations for follow-up with primary care physician.        Final Clinical Impressions(s) / ED Diagnoses   Final diagnoses:  Acute sinusitis, recurrence not specified, unspecified location  Labyrinthitis, bilateral    New Prescriptions New Prescriptions   No medications on file     Carlos Levering, PA-C 07/15/16 [place], MD 07/16/16 1650

## 2016-07-15 NOTE — ED Triage Notes (Signed)
Onset 1 week ago bilateral ear pain and fullness, headache, mild cough, nasal congestion, maxillary pain, dizziness.  Taking sudafed with no relief.  Takes daily Zyrtec, Singulair.  No respiratory difficulties

## 2016-07-15 NOTE — Discharge Instructions (Signed)
Continue home nasal rinsing, Zyrtec and Singulair daily. Avoid Sudafed as this may interfere with your blood pressure. Recommend Mucinex instead. Take antibiotics as prescribed. Take Meclizine for dizziness. Follow up with your primary care provider if your symptoms do not improve. Return to the Ed if you experience severe worsening of your symptoms, loss of consciousness, fevers, difficulty breathing.

## 2016-10-07 ENCOUNTER — Emergency Department (HOSPITAL_COMMUNITY)
Admission: EM | Admit: 2016-10-07 | Discharge: 2016-10-07 | Disposition: A | Discharge status: Home / Self Care | Payer: Medicaid Other | Attending: Emergency Medicine | Admitting: Emergency Medicine

## 2016-10-07 ENCOUNTER — Encounter (HOSPITAL_COMMUNITY): Payer: Self-pay

## 2016-10-07 DIAGNOSIS — F17210 Nicotine dependence, cigarettes, uncomplicated: Secondary | ICD-10-CM | POA: Insufficient documentation

## 2016-10-07 DIAGNOSIS — Z794 Long term (current) use of insulin: Secondary | ICD-10-CM | POA: Insufficient documentation

## 2016-10-07 DIAGNOSIS — Z7984 Long term (current) use of oral hypoglycemic drugs: Secondary | ICD-10-CM | POA: Insufficient documentation

## 2016-10-07 DIAGNOSIS — Z79899 Other long term (current) drug therapy: Secondary | ICD-10-CM | POA: Insufficient documentation

## 2016-10-07 DIAGNOSIS — M25511 Pain in right shoulder: Secondary | ICD-10-CM | POA: Diagnosis not present

## 2016-10-07 DIAGNOSIS — Z7982 Long term (current) use of aspirin: Secondary | ICD-10-CM | POA: Diagnosis not present

## 2016-10-07 DIAGNOSIS — M542 Cervicalgia: Secondary | ICD-10-CM | POA: Diagnosis not present

## 2016-10-07 DIAGNOSIS — M7918 Myalgia, other site: Secondary | ICD-10-CM

## 2016-10-07 DIAGNOSIS — E119 Type 2 diabetes mellitus without complications: Secondary | ICD-10-CM | POA: Diagnosis not present

## 2016-10-07 DIAGNOSIS — I10 Essential (primary) hypertension: Secondary | ICD-10-CM | POA: Diagnosis not present

## 2016-10-07 DIAGNOSIS — M797 Fibromyalgia: Secondary | ICD-10-CM | POA: Diagnosis present

## 2016-10-07 MED ORDER — IBUPROFEN 200 MG PO TABS
600.0000 mg | ORAL_TABLET | Freq: Once | ORAL | Status: AC
  Administered 2016-10-07: 600 mg via ORAL
  Filled 2016-10-07: qty 1

## 2016-10-07 NOTE — ED Provider Notes (Signed)
[facility] DEPT Provider Note   CSN: [id number] Arrival date & time: 10/07/16  1811     History   Chief Complaint Chief Complaint  Patient presents with  . Fibromyalgia    HPI Ellen Avery is a 50 y.o. female.  HPI   50 year old female presents today with complaints of fibromyalgia. Patient reports symptoms flared up today, reporting that the weather makes her fibromyalgia worse. She reports the pain is located over the right neck and shoulder, bilateral knees. She reports she does have cervical problems, but notes when she had knee pain associated with the neck pain it is her fibromyalgia. Patient reports using ibuprofen at home with no significant improvement in his symptoms. Denies any infectious etiology, denies any neurological deficits.  Past Medical History:  Diagnosis Date  . Anxiety and depression   . Depression   . Diabetes mellitus without complication ([place])   . GERD (gastroesophageal reflux disease)   . Hypertension     Patient Active Problem List   Diagnosis Date Noted  . Altered mental status 03/02/2016  . MDD (major depressive disorder), recurrent episode, moderate ([place]) 03/02/2016  . Psychoses     Past Surgical History:  Procedure Laterality Date  . ABDOMINAL HYSTERECTOMY      OB History    No data available       Home Medications    Prior to Admission medications   Medication Sig Start Date End Date Taking? Authorizing Provider  acetaminophen (TYLENOL) 500 MG tablet Take 1,000 mg by mouth every 6 (six) hours as needed for mild pain.    Historical Provider, MD  Alum & Mag Hydroxide-Simeth (GI COCKTAIL) SUSP suspension Take 15 mLs by mouth 3 (three) times daily as needed for indigestion. Shake well. 02/21/16   Veryl Speak, MD  amoxicillin-clavulanate (AUGMENTIN) 875-125 MG tablet Take 1 tablet by mouth every 12 (twelve) hours. 07/15/16   Samantha Tripp Dowless, PA-C  ARIPiprazole (ABILIFY) 15 MG tablet Take 15 mg by mouth daily.    Historical  Provider, MD  aspirin EC 81 MG tablet Take 81 mg by mouth daily.    Historical Provider, MD  benzonatate (TESSALON) 100 MG capsule Take 1 capsule (100 mg total) by mouth every 8 (eight) hours. 02/01/16   Clayton Bibles, PA-C  cyclobenzaprine (FLEXERIL) 5 MG tablet Take 1 tablet (5 mg total) by mouth 3 (three) times daily as needed for muscle spasms. 06/09/16   [name], NP  Dextromethorphan-Guaifenesin 10-200 MG CAPS Take 2 tablets by mouth every 6 (six) hours. 06/09/16   [name], NP  esomeprazole (NEXIUM) 40 MG capsule Take 40 mg by mouth daily at 12 noon.    Historical Provider, MD  FLUoxetine (PROZAC) 20 MG capsule Take 20 mg by mouth daily.    Historical Provider, MD  insulin aspart (NOVOLOG) 100 UNIT/ML injection Inject 0-5 Units into the skin 3 (three) times daily before meals. Sliding scale    Historical Provider, MD  insulin glargine (LANTUS) 100 UNIT/ML injection Inject 20 Units into the skin at bedtime.     Historical Provider, MD  meclizine (ANTIVERT) 25 MG tablet Take 1 tablet (25 mg total) by mouth 3 (three) times daily as needed for dizziness. 07/15/16   Samantha Tripp Dowless, PA-C  metFORMIN (GLUCOPHAGE) 1000 MG tablet Take 1,000 mg by mouth daily with breakfast.    Historical Provider, MD  ondansetron (ZOFRAN) 4 MG tablet Take 1 tablet (4 mg total) by mouth every 12 (twelve) hours as needed for nausea or  vomiting. 01/09/16   Jarome Matin, MD  potassium chloride SA (K-DUR,KLOR-CON) 20 MEQ tablet Take 1 tablet (20 mEq total) by mouth daily. 03/02/16   Sherwood Gambler, MD  simvastatin (ZOCOR) 40 MG tablet Take 40 mg by mouth every evening. Reported on 03/01/2016    Historical Provider, MD    Family History History reviewed. No pertinent family history.  Social History Social History  Substance Use Topics  . Smoking status: Current Every Day Smoker    Packs/day: 1.00    Types: Cigarettes  . Smokeless tobacco: Never Used  . Alcohol use No     Allergies   Flagyl  [metronidazole]   Review of Systems Review of Systems  All other systems reviewed and are negative.    Physical Exam Updated Vital Signs BP 123/81 (BP Location: Right Arm)   Pulse 83   Temp 98.8 F (37.1 C) (Oral)   Resp 20   Ht 5\' 7"  (1.702 m)   Wt 108.9 kg   SpO2 100%   BMI 37.59 kg/m   Physical Exam  Constitutional: She is oriented to person, place, and time. She appears well-developed and well-nourished.  HENT:  Head: Normocephalic and atraumatic.  Eyes: Conjunctivae are normal. Pupils are equal, round, and reactive to light. Right eye exhibits no discharge. Left eye exhibits no discharge. No scleral icterus.  Neck: Normal range of motion. No JVD present. No tracheal deviation present.  Pulmonary/Chest: Effort normal. No stridor.  Musculoskeletal:  Tenderness to palpation of the right trapezius and shoulder, no warmth to touch, no rash, full active range of motion of the upper extremities. Sensation grossly intact. Bilateral knee tenderness palpation, full active range of motion, no swelling or warmth.  Neurological: She is alert and oriented to person, place, and time. Coordination normal.  Psychiatric: She has a normal mood and affect. Her behavior is normal. Judgment and thought content normal.  Nursing note and vitals reviewed.    ED Treatments / Results  Labs (all labs ordered are listed, but only abnormal results are displayed) Labs Reviewed - No data to display  EKG  EKG Interpretation None       Radiology No results found.  Procedures Procedures (including critical care time)  Medications Ordered in ED Medications  ibuprofen (ADVIL,MOTRIN) tablet 600 mg (600 mg Oral Given 10/07/16 1951)     Initial Impression / Assessment and Plan / ED Course  I have reviewed the triage vital signs and the nursing notes.  Pertinent labs & imaging results that were available during my care of the patient were reviewed by me and considered in my medical  decision making (see chart for details).  Clinical Course    Patient presents with fibromyalgia complaints. No need for further evaluation or management here in the ED setting. Patient instructed follow-up with primary care for reevaluation. She verbalized understanding and agreement to today's plan had no further questions or concerns.  Final Clinical Impressions(s) / ED Diagnoses   Final diagnoses:  Musculoskeletal pain    New Prescriptions Discharge Medication List as of 10/07/2016  7:34 PM       Okey Regal, PA-C 10/07/16 2249    Orlie Dakin, MD 10/08/16 [phone]

## 2016-10-07 NOTE — ED Triage Notes (Signed)
Pt reports generalized pain X2 days. She reports hx of fibromyalgia. Pt reports the pain is the worst in the right side of her neck.

## 2016-10-07 NOTE — Discharge Instructions (Signed)
Please read attached information. If you experience any new or worsening signs or symptoms please return to the emergency room for evaluation. Please follow-up with your primary care provider or specialist as discussed.  °

## 2016-12-18 ENCOUNTER — Ambulatory Visit: Payer: Self-pay | Admitting: Obstetrics

## 2017-01-03 ENCOUNTER — Emergency Department (HOSPITAL_COMMUNITY)
Admission: EM | Admit: 2017-01-03 | Discharge: 2017-01-03 | Disposition: A | Discharge status: Home / Self Care | Payer: Medicaid Other | Attending: Emergency Medicine | Admitting: Emergency Medicine

## 2017-01-03 ENCOUNTER — Encounter (HOSPITAL_COMMUNITY): Payer: Self-pay

## 2017-01-03 DIAGNOSIS — I10 Essential (primary) hypertension: Secondary | ICD-10-CM | POA: Diagnosis not present

## 2017-01-03 DIAGNOSIS — F17210 Nicotine dependence, cigarettes, uncomplicated: Secondary | ICD-10-CM | POA: Diagnosis not present

## 2017-01-03 DIAGNOSIS — E119 Type 2 diabetes mellitus without complications: Secondary | ICD-10-CM | POA: Diagnosis not present

## 2017-01-03 DIAGNOSIS — Z794 Long term (current) use of insulin: Secondary | ICD-10-CM | POA: Diagnosis not present

## 2017-01-03 DIAGNOSIS — J3489 Other specified disorders of nose and nasal sinuses: Secondary | ICD-10-CM | POA: Diagnosis not present

## 2017-01-03 DIAGNOSIS — M62838 Other muscle spasm: Secondary | ICD-10-CM | POA: Diagnosis not present

## 2017-01-03 DIAGNOSIS — Z79899 Other long term (current) drug therapy: Secondary | ICD-10-CM | POA: Diagnosis not present

## 2017-01-03 DIAGNOSIS — Z7982 Long term (current) use of aspirin: Secondary | ICD-10-CM | POA: Insufficient documentation

## 2017-01-03 NOTE — ED Triage Notes (Signed)
Patient here with facial pain and cold symptoms x 1 week, has hx of sinus infections. Tenderness around cheek and orbits, no fever

## 2017-01-03 NOTE — ED Provider Notes (Signed)
[facility] DEPT Provider Note   CSN: [id number] Arrival date & time: 01/03/17  [id number]  By signing my name below, I, Jaquelyn Bitter., attest that this documentation has been prepared under the direction and in the presence of Merryl Hacker, MD. Electronically signed: Jaquelyn Bitter., ED Scribe. 01/03/17. 12:38 PM.    History   Chief Complaint No chief complaint on file.   HPI  Ellen Avery is a 51 y.o. female with hx of HTN who presents to the Emergency Department complaining of mild to moderate sinus pressure onset x4 days ago. Pt states that she suspects she has a sinus infection due to congestion, facial pain and headache that worsened x1 day ago. Pt rates the headache 5/10. Pt also reports muscle spasms in the L neck with onset in the past day. SHe has a history of the same.  She reports cough, congestion, ear pain/fullness, sore throat, facial pain. Pt has tried nasal rinses and Flonase nasal spray with mild relief of congestion. She has taken Zyrtec and Ibuprofen with some improvement as well. Pt denies chest pain, fever, abdominal pain, nausea, vomiting and sick contact. Of note, pt has hx of muscle spasms for which she takes Flexeril with relief.   The history is provided by the patient. No language interpreter was used.    Past Medical History:  Diagnosis Date  . Anxiety and depression   . Depression   . Diabetes mellitus without complication ([place])   . GERD (gastroesophageal reflux disease)   . Hypertension     Patient Active Problem List   Diagnosis Date Noted  . Altered mental status 03/02/2016  . MDD (major depressive disorder), recurrent episode, moderate ([place]) 03/02/2016  . Psychoses     Past Surgical History:  Procedure Laterality Date  . ABDOMINAL HYSTERECTOMY      OB History    No data available       Home Medications    Prior to Admission medications   Medication Sig Start Date End Date Taking? Authorizing Provider    acetaminophen (TYLENOL) 500 MG tablet Take 1,000 mg by mouth every 6 (six) hours as needed for mild pain.    Historical Provider, MD  Alum & Mag Hydroxide-Simeth (GI COCKTAIL) SUSP suspension Take 15 mLs by mouth 3 (three) times daily as needed for indigestion. Shake well. 02/21/16   Veryl Speak, MD  amoxicillin-clavulanate (AUGMENTIN) 875-125 MG tablet Take 1 tablet by mouth every 12 (twelve) hours. 07/15/16   Samantha Tripp Dowless, PA-C  ARIPiprazole (ABILIFY) 15 MG tablet Take 15 mg by mouth daily.    Historical Provider, MD  aspirin EC 81 MG tablet Take 81 mg by mouth daily.    Historical Provider, MD  benzonatate (TESSALON) 100 MG capsule Take 1 capsule (100 mg total) by mouth every 8 (eight) hours. 02/01/16   Clayton Bibles, PA-C  cyclobenzaprine (FLEXERIL) 5 MG tablet Take 1 tablet (5 mg total) by mouth 3 (three) times daily as needed for muscle spasms. 06/09/16   [name], NP  Dextromethorphan-Guaifenesin 10-200 MG CAPS Take 2 tablets by mouth every 6 (six) hours. 06/09/16   [name], NP  esomeprazole (NEXIUM) 40 MG capsule Take 40 mg by mouth daily at 12 noon.    Historical Provider, MD  FLUoxetine (PROZAC) 20 MG capsule Take 20 mg by mouth daily.    Historical Provider, MD  insulin aspart (NOVOLOG) 100 UNIT/ML injection Inject 0-5 Units into the skin 3 (three) times daily before meals.  Sliding scale    Historical Provider, MD  insulin glargine (LANTUS) 100 UNIT/ML injection Inject 20 Units into the skin at bedtime.     Historical Provider, MD  meclizine (ANTIVERT) 25 MG tablet Take 1 tablet (25 mg total) by mouth 3 (three) times daily as needed for dizziness. 07/15/16   Samantha Tripp Dowless, PA-C  metFORMIN (GLUCOPHAGE) 1000 MG tablet Take 1,000 mg by mouth daily with breakfast.    Historical Provider, MD  ondansetron (ZOFRAN) 4 MG tablet Take 1 tablet (4 mg total) by mouth every 12 (twelve) hours as needed for nausea or vomiting. 01/09/16   Jarome Matin, MD  potassium chloride SA  (K-DUR,KLOR-CON) 20 MEQ tablet Take 1 tablet (20 mEq total) by mouth daily. 03/02/16   Sherwood Gambler, MD  simvastatin (ZOCOR) 40 MG tablet Take 40 mg by mouth every evening. Reported on 03/01/2016    Historical Provider, MD    Family History No family history on file.  Social History Social History  Substance Use Topics  . Smoking status: Current Every Day Smoker    Packs/day: 1.00    Types: Cigarettes  . Smokeless tobacco: Never Used  . Alcohol use No     Allergies   Flagyl [metronidazole]   Review of Systems Review of Systems  Constitutional: Negative for fever.  HENT: Positive for congestion, ear pain and sore throat.        Facial pain  Respiratory: Positive for cough.   Cardiovascular: Negative for chest pain.  Gastrointestinal: Negative for abdominal pain, nausea and vomiting.  Musculoskeletal: Positive for neck pain.     Physical Exam Updated Vital Signs BP 136/88 (BP Location: Right Arm)   Pulse 78   Temp 98.8 F (37.1 C) (Oral)   Resp 18   SpO2 100%   Physical Exam  Constitutional: She is oriented to person, place, and time. She appears well-developed and well-nourished.  overweight  HENT:  Head: Normocephalic and atraumatic.  Effusion noted left ear, intact light reflex, serum impaction right ear, tenderness palpation bilateral maxillary sinuses, uvula midline, no posterior or pharyngeal erythema, postnasal drip noted  Eyes: Pupils are equal, round, and reactive to light.  Neck: Neck supple.  No meningismus, guarded movement with axial rotation to the right, tenderness palpation left rhomboid with spasm noted  Cardiovascular: Normal rate, regular rhythm and normal heart sounds.   Pulmonary/Chest: Effort normal and breath sounds normal. No respiratory distress. She has no wheezes.  Abdominal: Soft. Bowel sounds are normal. There is no tenderness. There is no guarding.  Neurological: She is alert and oriented to person, place, and time.  Skin: Skin is  warm and dry.  Psychiatric: She has a normal mood and affect.  Nursing note and vitals reviewed.    ED Treatments / Results   DIAGNOSTIC STUDIES: Oxygen Saturation is 100% on RA, normal by my interpretation.   COORDINATION OF CARE: 12:38 PM-Discussed next steps with pt. Pt verbalized understanding and is agreeable with the plan.    Labs (all labs ordered are listed, but only abnormal results are displayed) Labs Reviewed - No data to display  EKG  EKG Interpretation None       Radiology No results found.  Procedures Procedures (including critical care time)  Medications Ordered in ED Medications - No data to display   Initial Impression / Assessment and Plan / ED Course  I have reviewed the triage vital signs and the nursing notes.  Pertinent labs & imaging results that were available during my  care of the patient were reviewed by me and considered in my medical decision making (see chart for details).     Patient presents with a four-day history of worsening facial pain, headache, congestion. She is nontoxic on exam. Afebrile. She does report using nasal saline, Flonase, and ibuprofen with some improvement of her symptoms. She is afebrile. Her exam is fairly benign. I discussed with her that at this time, I did not feel antibiotics were indicated. Continue supportive measures at home. I did encourage her to use a decongestant but does not have pseudoephedrine in it. I also have instructed her to follow-up with primary physician if she is not feeling better in the next 2-3 days and at that time she may be a candidate for antibiotics if not improved. Patient stated understanding.  After history, exam, and medical workup I feel the patient has been appropriately medically screened and is safe for discharge home. Pertinent diagnoses were discussed with the patient. Patient was given return precautions.   Final Clinical Impressions(s) / ED Diagnoses   Final diagnoses:    Sinus pressure  Muscle spasm    New Prescriptions New Prescriptions   No medications on file   I personally performed the services described in this documentation, which was scribed in my presence. The recorded information has been reviewed and is accurate.     Merryl Hacker, MD 01/03/17 [phone]

## 2017-01-03 NOTE — Discharge Instructions (Signed)
Seen today for sinus headache and left-sided muscle spasm. Continue Flonase and nasal saline to allow for drainage. You may use an over-the-counter decongestant that does not contain pseudoephedrine. Asked her local pharmacist for help. Continue ibuprofen as needed for headache.If your symptoms persist for 2-3 more days, follow up with her primary physician. At that time, you may benefit from antibiotics.

## 2017-01-03 NOTE — ED Notes (Signed)
Declined W/C at D/C and was escorted to lobby by RN. 

## 2017-01-22 ENCOUNTER — Emergency Department (HOSPITAL_COMMUNITY)
Admission: EM | Admit: 2017-01-22 | Discharge: 2017-01-22 | Disposition: A | Discharge status: Home / Self Care | Payer: Medicaid Other | Attending: Emergency Medicine | Admitting: Emergency Medicine

## 2017-01-22 ENCOUNTER — Encounter (HOSPITAL_COMMUNITY): Payer: Self-pay | Admitting: Emergency Medicine

## 2017-01-22 DIAGNOSIS — I10 Essential (primary) hypertension: Secondary | ICD-10-CM | POA: Diagnosis not present

## 2017-01-22 DIAGNOSIS — Z7982 Long term (current) use of aspirin: Secondary | ICD-10-CM | POA: Diagnosis not present

## 2017-01-22 DIAGNOSIS — J0100 Acute maxillary sinusitis, unspecified: Secondary | ICD-10-CM | POA: Diagnosis not present

## 2017-01-22 DIAGNOSIS — Z794 Long term (current) use of insulin: Secondary | ICD-10-CM | POA: Insufficient documentation

## 2017-01-22 DIAGNOSIS — E119 Type 2 diabetes mellitus without complications: Secondary | ICD-10-CM | POA: Diagnosis not present

## 2017-01-22 DIAGNOSIS — R51 Headache: Secondary | ICD-10-CM | POA: Diagnosis present

## 2017-01-22 DIAGNOSIS — Z79899 Other long term (current) drug therapy: Secondary | ICD-10-CM | POA: Insufficient documentation

## 2017-01-22 DIAGNOSIS — F17210 Nicotine dependence, cigarettes, uncomplicated: Secondary | ICD-10-CM | POA: Insufficient documentation

## 2017-01-22 MED ORDER — SALINE SPRAY 0.65 % NA SOLN: 1.0000 | NASAL | 0 refills | Status: DC | PRN

## 2017-01-22 MED ORDER — AMOXICILLIN 500 MG PO CAPS: 500.0000 mg | ORAL_CAPSULE | Freq: Three times a day (TID) | ORAL | 0 refills | Status: DC

## 2017-01-22 NOTE — Discharge Instructions (Signed)
Start the antibiotics.  You are diabetic, so ensure the symptoms are getting better and see your primary doctor in 1 week.  Please return to the ER if the headache gets severe and in not improving, you have associated new one sided numbness, tingling, weakness or confusion, seizures, poor balance or poor vision.

## 2017-01-22 NOTE — ED Triage Notes (Signed)
Pt c/o facial pain and cold symptoms for approx 1 month.  Was seen here on 1/27 but is not getting any better.  Has been taking OTC meds without relief

## 2017-01-22 NOTE — ED Provider Notes (Signed)
[name] DEPT Provider Note   CSN: [id number] Arrival date & time: 01/22/17  1623   By signing my name below, I, Evelene Croon, attest that this documentation has been prepared under the direction and in the presence of Varney Biles, MD . Electronically Signed: Evelene Croon, Scribe. 01/22/2017. 6:01 PM.  History   Chief Complaint Chief Complaint  Patient presents with  . Facial Pain   The history is provided by the patient. No language interpreter was used.    HPI Comments:  Ellen Avery is a 51 y.o. female who presents to the Emergency Department complaining of moderate left sided facial pain with associated HA x 2-3 weeks. She describes her pain as a pressure. Pt has a h/o same due to sinus infection which resolved after she was placed on antibiotics. She also notes yellow nasal drainage. She has been taking mucinex and Claritin with little relief. No recent vision changes, fever, dizziness, or numbnesss/weakness.   Past Medical History:  Diagnosis Date  . Anxiety and depression   . Depression   . Diabetes mellitus without complication ([place])   . GERD (gastroesophageal reflux disease)   . Hypertension     Patient Active Problem List   Diagnosis Date Noted  . Altered mental status 03/02/2016  . MDD (major depressive disorder), recurrent episode, moderate ([place]) 03/02/2016  . Psychoses     Past Surgical History:  Procedure Laterality Date  . ABDOMINAL HYSTERECTOMY      OB History    No data available       Home Medications    Prior to Admission medications   Medication Sig Start Date End Date Taking? Authorizing Provider  acetaminophen (TYLENOL) 500 MG tablet Take 1,000 mg by mouth every 6 (six) hours as needed for mild pain.    Historical Provider, MD  Alum & Mag Hydroxide-Simeth (GI COCKTAIL) SUSP suspension Take 15 mLs by mouth 3 (three) times daily as needed for indigestion. Shake well. 02/21/16   Veryl Speak, MD  amoxicillin (AMOXIL) 500 MG capsule  Take 1 capsule (500 mg total) by mouth 3 (three) times daily. 01/22/17   Varney Biles, MD  amoxicillin-clavulanate (AUGMENTIN) 875-125 MG tablet Take 1 tablet by mouth every 12 (twelve) hours. 07/15/16   Samantha Tripp Dowless, PA-C  ARIPiprazole (ABILIFY) 15 MG tablet Take 15 mg by mouth daily.    Historical Provider, MD  aspirin EC 81 MG tablet Take 81 mg by mouth daily.    Historical Provider, MD  benzonatate (TESSALON) 100 MG capsule Take 1 capsule (100 mg total) by mouth every 8 (eight) hours. 02/01/16   Clayton Bibles, PA-C  cyclobenzaprine (FLEXERIL) 5 MG tablet Take 1 tablet (5 mg total) by mouth 3 (three) times daily as needed for muscle spasms. 06/09/16   [name], NP  Dextromethorphan-Guaifenesin 10-200 MG CAPS Take 2 tablets by mouth every 6 (six) hours. 06/09/16   [name], NP  esomeprazole (NEXIUM) 40 MG capsule Take 40 mg by mouth daily at 12 noon.    Historical Provider, MD  FLUoxetine (PROZAC) 20 MG capsule Take 20 mg by mouth daily.    Historical Provider, MD  insulin aspart (NOVOLOG) 100 UNIT/ML injection Inject 0-5 Units into the skin 3 (three) times daily before meals. Sliding scale    Historical Provider, MD  insulin glargine (LANTUS) 100 UNIT/ML injection Inject 20 Units into the skin at bedtime.     Historical Provider, MD  meclizine (ANTIVERT) 25 MG tablet Take 1 tablet (25 mg total) by  mouth 3 (three) times daily as needed for dizziness. 07/15/16   Samantha Tripp Dowless, PA-C  metFORMIN (GLUCOPHAGE) 1000 MG tablet Take 1,000 mg by mouth daily with breakfast.    Historical Provider, MD  ondansetron (ZOFRAN) 4 MG tablet Take 1 tablet (4 mg total) by mouth every 12 (twelve) hours as needed for nausea or vomiting. 01/09/16   Jarome Matin, MD  potassium chloride SA (K-DUR,KLOR-CON) 20 MEQ tablet Take 1 tablet (20 mEq total) by mouth daily. 03/02/16   Sherwood Gambler, MD  simvastatin (ZOCOR) 40 MG tablet Take 40 mg by mouth every evening. Reported on 03/01/2016    Historical Provider, MD    sodium chloride (OCEAN) 0.65 % SOLN nasal spray Place 1 spray into both nostrils as needed for congestion. 01/22/17   Varney Biles, MD    Family History No family history on file.  Social History Social History  Substance Use Topics  . Smoking status: Current Every Day Smoker    Packs/day: 1.00    Types: Cigarettes  . Smokeless tobacco: Never Used  . Alcohol use No     Allergies   Flagyl [metronidazole]   Review of Systems Review of Systems  Constitutional: Negative for fever.  HENT: Positive for congestion and sinus pain.   Eyes: Negative for visual disturbance.  Neurological: Positive for headaches. Negative for dizziness, weakness and numbness.     Physical Exam Updated Vital Signs BP 116/89 (BP Location: Left Arm)   Pulse 84   Temp 99.1 F (37.3 C) (Oral)   Resp 18   Ht 5\' 7"  (1.702 m)   Wt 234 lb (106.1 kg)   SpO2 97%   BMI 36.65 kg/m   Physical Exam  Constitutional: She is oriented to person, place, and time. She appears well-developed and well-nourished. No distress.  HENT:  Head: Normocephalic and atraumatic.  Reproducible tenderness over maxillary sinus  L>R and over temporal region   Eyes: Conjunctivae are normal.  Pupils are 3 mm and equal  No nystagmus  Neck: Normal range of motion.  Cardiovascular: Normal rate, regular rhythm and normal heart sounds.   No murmur heard. Pulmonary/Chest: Effort normal and breath sounds normal. No respiratory distress.  Lungs clear to auscultation  Abdominal: Soft. She exhibits no distension. There is no tenderness.  Musculoskeletal: Normal range of motion.  Neurological: She is alert and oriented to person, place, and time. No cranial nerve deficit. Coordination normal.  Skin: Skin is warm and dry.  Psychiatric: She has a normal mood and affect.  Nursing note and vitals reviewed.    ED Treatments / Results  DIAGNOSTIC STUDIES:  Oxygen Saturation is 97% on RA, normal by my interpretation.     COORDINATION OF CARE:  6:00 PM Discussed treatment plan with pt at bedside and pt agreed to plan.  Labs (all labs ordered are listed, but only abnormal results are displayed) Labs Reviewed - No data to display  EKG  EKG Interpretation None       Radiology No results found.  Procedures Procedures (including critical care time)  Medications Ordered in ED Medications - No data to display   Initial Impression / Assessment and Plan / ED Course  I have reviewed the triage vital signs and the nursing notes.  Pertinent labs & imaging results that were available during my care of the patient were reviewed by me and considered in my medical decision making (see chart for details).     Pt comes in with cc of headache /facial pain,  slowly getting worse. She has pain over the sinuses, and in the past she has had similar symptoms with sinusitis. Her current symptoms have been present for few days now and are progressing. Pt is not toxic in appearance, headaches are not severe and she has no red flags on hx or exam to suggest any intracranial extension of the infection.  We will get her started on amoxi. Strict ER return precautions have been discussed, and patient is agreeing with the plan and is comfortable with the workup done and the recommendations from the ER.    Final Clinical Impressions(s) / ED Diagnoses   Final diagnoses:  Acute maxillary sinusitis, recurrence not specified    New Prescriptions New Prescriptions   AMOXICILLIN (AMOXIL) 500 MG CAPSULE    Take 1 capsule (500 mg total) by mouth 3 (three) times daily.   SODIUM CHLORIDE (OCEAN) 0.65 % SOLN NASAL SPRAY    Place 1 spray into both nostrils as needed for congestion.   I personally performed the services described in this documentation, which was scribed in my presence. The recorded information has been reviewed and is accurate.     Varney Biles, MD 01/22/17 [phone]

## 2017-01-22 NOTE — ED Notes (Signed)
Declined W/C at D/C and was escorted to lobby by RN. 

## 2017-01-23 ENCOUNTER — Ambulatory Visit (INDEPENDENT_AMBULATORY_CARE_PROVIDER_SITE_OTHER): Payer: Medicaid Other | Admitting: Obstetrics

## 2017-01-23 ENCOUNTER — Other Ambulatory Visit (HOSPITAL_COMMUNITY)
Admission: RE | Admit: 2017-01-23 | Discharge: 2017-01-23 | Disposition: A | Discharge status: Home / Self Care | Payer: Medicaid Other | Source: Ambulatory Visit | Attending: Obstetrics | Admitting: Obstetrics

## 2017-01-23 ENCOUNTER — Encounter: Payer: Self-pay | Admitting: Obstetrics

## 2017-01-23 VITALS — BP 106/56 | HR 88 | Ht 67.0 in | Wt 228.0 lb

## 2017-01-23 DIAGNOSIS — N761 Subacute and chronic vaginitis: Secondary | ICD-10-CM | POA: Insufficient documentation

## 2017-01-23 DIAGNOSIS — Z01419 Encounter for gynecological examination (general) (routine) without abnormal findings: Secondary | ICD-10-CM

## 2017-01-23 DIAGNOSIS — Z0000 Encounter for general adult medical examination without abnormal findings: Secondary | ICD-10-CM | POA: Diagnosis not present

## 2017-01-23 DIAGNOSIS — E8941 Symptomatic postprocedural ovarian failure: Secondary | ICD-10-CM

## 2017-01-23 DIAGNOSIS — Z1239 Encounter for other screening for malignant neoplasm of breast: Secondary | ICD-10-CM

## 2017-01-23 MED ORDER — ESTRADIOL 1 MG PO TABS: 1.0000 mg | ORAL_TABLET | Freq: Every day | ORAL | 3 refills | Status: DC

## 2017-01-23 NOTE — Progress Notes (Signed)
Subjective:        Ellen Avery is a 51 y.o. female here for a routine exam.  Current complaints: Hot flashes..    Personal health questionnaire:  Is patient Ashkenazi Jewish, have a family history of breast and/or ovarian cancer: no Is there a family history of uterine cancer diagnosed at age < 24, gastrointestinal cancer, urinary tract cancer, family member who is a Field seismologist syndrome-associated carrier: no Is the patient overweight and hypertensive, family history of diabetes, personal history of gestational diabetes, preeclampsia or PCOS: no Is patient over 75, have PCOS,  family history of premature CHD under age 3, diabetes, smoke, have hypertension or peripheral artery disease:  no At any time, has a partner hit, kicked or otherwise Prosise or frightened you?: no Over the past 2 weeks, have you felt down, depressed or hopeless?: no Over the past 2 weeks, have you felt little interest or pleasure in doing things?:no   Gynecologic History No LMP recorded. Patient has had a hysterectomy. Contraception: status post hysterectomy Last Pap: unknown. Results were: normal Last mammogram: n/a. Results were: n/a  Obstetric History OB History  No data available    Past Medical History:  Diagnosis Date  . Anxiety and depression   . Depression   . Diabetes mellitus without complication ([place])   . GERD (gastroesophageal reflux disease)   . Hypertension     Past Surgical History:  Procedure Laterality Date  . ABDOMINAL HYSTERECTOMY       Current Outpatient Prescriptions:  .  acetaminophen (TYLENOL) 500 MG tablet, Take 1,000 mg by mouth every 6 (six) hours as needed for mild pain., Disp: , Rfl:  .  amoxicillin (AMOXIL) 500 MG capsule, Take 1 capsule (500 mg total) by mouth 3 (three) times daily., Disp: 21 capsule, Rfl: 0 .  aspirin EC 81 MG tablet, Take 81 mg by mouth daily., Disp: , Rfl:  .  cyclobenzaprine (FLEXERIL) 5 MG tablet, Take 1 tablet (5 mg total) by mouth 3 (three) times  daily as needed for muscle spasms., Disp: 30 tablet, Rfl: 0 .  insulin glargine (LANTUS) 100 UNIT/ML injection, Inject 20 Units into the skin at bedtime. , Disp: , Rfl:  .  metFORMIN (GLUCOPHAGE) 1000 MG tablet, Take 1,000 mg by mouth daily with breakfast., Disp: , Rfl:  .  simvastatin (ZOCOR) 40 MG tablet, Take 40 mg by mouth every evening. Reported on 03/01/2016, Disp: , Rfl:  .  sodium chloride (OCEAN) 0.65 % SOLN nasal spray, Place 1 spray into both nostrils as needed for congestion., Disp: 15 mL, Rfl: 0 .  estradiol (ESTRACE) 1 MG tablet, Take 1 tablet (1 mg total) by mouth daily., Disp: 90 tablet, Rfl: 3 Allergies  Allergen Reactions  . Flagyl [Metronidazole] Hives and Itching    Benadryl     Social History  Substance Use Topics  . Smoking status: Current Every Day Smoker    Packs/day: 1.00    Types: Cigarettes  . Smokeless tobacco: Never Used  . Alcohol use No    History reviewed. No pertinent family history.    Review of Systems  Constitutional: negative for fatigue and weight loss Respiratory: negative for cough and wheezing Cardiovascular: negative for chest pain, fatigue and palpitations Gastrointestinal: negative for abdominal pain and change in bowel habits Musculoskeletal:negative for myalgias Neurological: negative for gait problems and tremors Behavioral/Psych: negative for abusive relationship, depression Endocrine: negative for temperature intolerance    Genitourinary:negative for abnormal menstrual periods, genital lesions, hot flashes, sexual problems and  vaginal discharge Integument/breast: negative for breast lump, breast tenderness, nipple discharge and skin lesion(s)    Objective:       BP (!) 106/56   Pulse 88   Ht 5\' 7"  (1.702 m)   Wt 228 lb (103.4 kg)   BMI 35.71 kg/m  General:   alert  Skin:   no rash or abnormalities  Lungs:   clear to auscultation bilaterally  Heart:   regular rate and rhythm, S1, S2 normal, no murmur, click, rub or gallop   Breasts:   normal without suspicious masses, skin or nipple changes or axillary nodes  Abdomen:  normal findings: no organomegaly, soft, non-tender and no hernia  Pelvis:  External genitalia: normal general appearance Urinary system: urethral meatus normal and bladder without fullness, nontender Vaginal: normal without tenderness, induration or masses Cervix: normal appearance Adnexa: normal bimanual exam Uterus: anteverted and non-tender, normal size   Lab Review Urine pregnancy test Labs reviewed yes Radiologic studies reviewed yes  50% of 20 min visit spent on counseling and coordination of care.    Assessment:    Healthy female exam.  Surgical menopause.  Vasomotor symptoms   Plan:    Estrace Rx  Mammogram scheduled   Education reviewed: calcium supplements, depression evaluation, low fat, low cholesterol diet, safe sex/STD prevention, self breast exams and weight bearing exercise. Hormone replacement therapy: hormone replacement therapy: E2, risks and benefits reviewed, prescription for E2 months and follow up appointment recommended. Mammogram ordered. Follow up in: 3 months.   Meds ordered this encounter  Medications  . estradiol (ESTRACE) 1 MG tablet    Sig: Take 1 tablet (1 mg total) by mouth daily.    Dispense:  90 tablet    Refill:  3   Orders Placed This Encounter  Procedures  . MM Digital Screening    PF:  SEVERAL YRS, OUT OF STATE- OFFICE STATES PT DOESN'T REMEMBER INS:  MEDICAID  [id number]  [id number]    Standing Status:   Future    Standing Expiration Date:   03/23/2018    Order Specific Question:   Reason for Exam (SYMPTOM  OR DIAGNOSIS REQUIRED)    Answer:   Screening    Order Specific Question:   Is the patient pregnant?    Answer:   No    Order Specific Question:   Preferred imaging location?    Answer:   [facility]     Patient ID: Ellen Avery, female   DOB: Sep 18, 1966, 51 y.o.   MRN: [id number]

## 2017-01-23 NOTE — Progress Notes (Signed)
Here today for annual exam, has had hysterectomy.  Hot flashes are an issue.  Has notice two warts on vagina.  Recently screened for all STD at [facility] office.

## 2017-01-23 NOTE — Patient Instructions (Addendum)
Menopause Menopause is the normal time of life when menstrual periods stop completely. Menopause is complete when you have missed 12 consecutive menstrual periods. It usually occurs between the ages of 63 years and 78 years. Very rarely does a woman develop menopause before the age of 74 years. At menopause, your ovaries stop producing the female hormones estrogen and progesterone. This can cause undesirable symptoms and also affect your health. Sometimes the symptoms may occur 4-5 years before the menopause begins. There is no relationship between menopause and:  Oral contraceptives.  Number of children you had.  Race.  The age your menstrual periods started (menarche). Heavy smokers and very thin women may develop menopause earlier in life. What are the causes?  The ovaries stop producing the female hormones estrogen and progesterone. Other causes include:  Surgery to remove both ovaries.  The ovaries stop functioning for no known reason.  Tumors of the pituitary gland in the brain.  Medical disease that affects the ovaries and hormone production.  Radiation treatment to the abdomen or pelvis.  Chemotherapy that affects the ovaries. What are the signs or symptoms?  Hot flashes.  Night sweats.  Decrease in sex drive.  Vaginal dryness and thinning of the vagina causing painful intercourse.  Dryness of the skin and developing wrinkles.  Headaches.  Tiredness.  Irritability.  Memory problems.  Weight gain.  Bladder infections.  Hair growth of the face and chest.  Infertility. More serious symptoms include:  Loss of bone (osteoporosis) causing breaks (fractures).  Depression.  Hardening and narrowing of the arteries (atherosclerosis) causing heart attacks and strokes. How is this diagnosed?  When the menstrual periods have stopped for 12 straight months.  Physical exam.  Hormone studies of the blood. How is this treated? There are many treatment  choices and nearly as many questions about them. The decisions to treat or not to treat menopausal changes is an individual choice made with your health care provider. Your health care provider can discuss the treatments with you. Together, you can decide which treatment will work best for you. Your treatment choices may include:  Hormone therapy (estrogen and progesterone).  Non-hormonal medicines.  Treating the individual symptoms with medicine (for example antidepressants for depression).  Herbal medicines that may help specific symptoms.  Counseling by a psychiatrist or psychologist.  Group therapy.  Lifestyle changes including:  Eating healthy.  Regular exercise.  Limiting caffeine and alcohol.  Stress management and meditation.  No treatment. Follow these instructions at home:  Take the medicine your health care provider gives you as directed.  Get plenty of sleep and rest.  Exercise regularly.  Eat a diet that contains calcium (good for the bones) and soy products (acts like estrogen hormone).  Avoid alcoholic beverages.  Do not smoke.  If you have hot flashes, dress in layers.  Take supplements, calcium, and vitamin D to strengthen bones.  You can use over-the-counter lubricants or moisturizers for vaginal dryness.  Group therapy is sometimes very helpful.  Acupuncture may be helpful in some cases. Contact a health care provider if:  You are not sure you are in menopause.  You are having menopausal symptoms and need advice and treatment.  You are still having menstrual periods after age 32 years.  You have pain with intercourse.  Menopause is complete (no menstrual period for 12 months) and you develop vaginal bleeding.  You need a referral to a specialist (gynecologist, psychiatrist, or psychologist) for treatment. Get help right away if:  You  have severe depression.  You have excessive vaginal bleeding.  You fell and think you have a broken  bone.  You have pain when you urinate.  You develop leg or chest pain.  You have a fast pounding heart beat (palpitations).  You have severe headaches.  You develop vision problems.  You feel a lump in your breast.  You have abdominal pain or severe indigestion. This information is not intended to replace advice given to you by your health care provider. Make sure you discuss any questions you have with your health care provider. Document Released: 02/14/2004 Document Revised: 05/01/2016 Document Reviewed: 06/23/2013 Elsevier Interactive Patient Education  2017 [employer] Inc. Menopause and Herbal Products Introduction WHAT IS MENOPAUSE? Menopause is the normal time of life when menstrual periods decrease in frequency and eventually stop completely. This process can take several years for some women. Menopause is complete when you have had an absence of menstruation for a full year since your last menstrual period. It usually occurs between the ages of 8 and 69. It is not common for menopause to begin before the age of 72. During menopause, your body stops producing the female hormones estrogen and progesterone. Common symptoms associated with this loss of hormones (vasomotor symptoms) are:  Hot flashes.  Hot flushes.  Night sweats. Other common symptoms and complications of menopause include:  Decrease in sex drive.  Vaginal dryness and thinning of the walls of the vagina. This can make sex painful.  Dryness of the skin and development of wrinkles.  Headaches.  Tiredness.  Irritability.  Memory problems.  Weight gain.  Bladder infections.  Hair growth on the face and chest.  Inability to reproduce offspring (infertility).  Loss of density in the bones (osteoporosis) increasing your risk for breaks (fractures).  Depression.  Hardening and narrowing of the arteries (atherosclerosis). This increases your risk of heart attack and stroke. WHAT TREATMENT OPTIONS  ARE AVAILABLE? There are many treatment choices for menopause symptoms. The most common treatment is hormone replacement therapy. Many alternative therapies for menopause are emerging, including the use of herbal products. These supplements can be found in the form of herbs, teas, oils, tinctures, and pills. Common herbal supplements for menopause are made from plants that contain phytoestrogens. Phytoestrogens are compounds that occur naturally in plants and plant products. They act like estrogen in the body. Foods and herbs that contain phytoestrogens include:  Soy.  Flax seeds.  Red clover.  Ginseng. WHAT MENOPAUSE SYMPTOMS MAY BE HELPED IF I USE HERBAL PRODUCTS?  Vasomotor symptoms. These may be helped by:  Soy. Some studies show that soy may have a moderate benefit for hot flashes.  Black cohosh. There is limited evidence indicating this may be beneficial for hot flashes.  Symptoms that are related to heart and blood vessel disease. These may be helped by soy. Studies have shown that soy can help to lower cholesterol.  Depression. This may be helped by:  St. John's wort. There is limited evidence that shows this may help mild to moderate depression.  Black cohosh. There is evidence that this may help depression and mood swings.  Osteoporosis. Soy may help to decrease bone loss that is associated with menopause and may prevent osteoporosis. Limited evidence indicates that red clover may offer some bone loss protection as well. Other herbal products that are commonly used during menopause lack enough evidence to support their use as a replacement for conventional menopause therapies. These products include evening primrose, ginseng, and red clover. WHAT  ARE THE CASES WHEN HERBAL PRODUCTS SHOULD NOT BE USED DURING MENOPAUSE? Do not use herbal products during menopause without your health care provider's approval if:  You are taking medicine.  You have a preexisting liver  condition. ARE THERE ANY RISKS IN MY TAKING HERBAL PRODUCTS DURING MENOPAUSE? If you choose to use herbal products to help with symptoms of menopause, keep in mind that:  Different supplements have different and unmeasured amounts of herbal ingredients.  Herbal products are not regulated the same way that medicines are.  Concentrations of herbs may vary depending on the way they are prepared. For example, the concentration may be different in a pill, tea, oil, and tincture.  Little is known about the risks of using herbal products, particularly the risks of long-term use.  Some herbal supplements can be harmful when combined with certain medicines. Most commonly reported side effects of herbal products are mild. However, if used improperly, many herbal supplements can cause serious problems. Talk to your health care provider before starting any herbal product. If problems develop, stop taking the supplement and let your health care provider know. This information is not intended to replace advice given to you by your health care provider. Make sure you discuss any questions you have with your health care provider. Document Released: 05/12/2008 Document Revised: 05/01/2016 Document Reviewed: 05/09/2014  2017 Elsevier

## 2017-01-26 LAB — CERVICOVAGINAL ANCILLARY ONLY
BACTERIAL VAGINITIS: NEGATIVE
CANDIDA VAGINITIS: NEGATIVE

## 2017-02-02 ENCOUNTER — Ambulatory Visit
Admission: RE | Admit: 2017-02-02 | Discharge: 2017-02-02 | Disposition: A | Discharge status: Home / Self Care | Payer: Medicaid Other | Source: Ambulatory Visit | Attending: Obstetrics | Admitting: Obstetrics

## 2017-02-02 DIAGNOSIS — Z1239 Encounter for other screening for malignant neoplasm of breast: Secondary | ICD-10-CM

## 2017-02-09 ENCOUNTER — Emergency Department (HOSPITAL_COMMUNITY)
Admission: EM | Admit: 2017-02-09 | Discharge: 2017-02-09 | Disposition: A | Discharge status: Home / Self Care | Payer: Medicaid Other | Attending: Emergency Medicine | Admitting: Emergency Medicine

## 2017-02-09 ENCOUNTER — Encounter (HOSPITAL_COMMUNITY): Payer: Self-pay | Admitting: Emergency Medicine

## 2017-02-09 DIAGNOSIS — I10 Essential (primary) hypertension: Secondary | ICD-10-CM | POA: Insufficient documentation

## 2017-02-09 DIAGNOSIS — F17210 Nicotine dependence, cigarettes, uncomplicated: Secondary | ICD-10-CM | POA: Diagnosis not present

## 2017-02-09 DIAGNOSIS — E119 Type 2 diabetes mellitus without complications: Secondary | ICD-10-CM | POA: Insufficient documentation

## 2017-02-09 DIAGNOSIS — R51 Headache: Secondary | ICD-10-CM

## 2017-02-09 DIAGNOSIS — J3489 Other specified disorders of nose and nasal sinuses: Secondary | ICD-10-CM | POA: Diagnosis not present

## 2017-02-09 DIAGNOSIS — R519 Headache, unspecified: Secondary | ICD-10-CM

## 2017-02-09 DIAGNOSIS — Z7982 Long term (current) use of aspirin: Secondary | ICD-10-CM | POA: Diagnosis not present

## 2017-02-09 MED ORDER — PREDNISONE 20 MG PO TABS
60.0000 mg | ORAL_TABLET | Freq: Once | ORAL | Status: AC
  Administered 2017-02-09: 60 mg via ORAL
  Filled 2017-02-09: qty 3

## 2017-02-09 MED ORDER — AMOXICILLIN 500 MG PO CAPS: 500.0000 mg | ORAL_CAPSULE | Freq: Three times a day (TID) | ORAL | 0 refills | Status: DC

## 2017-02-09 MED ORDER — AMOXICILLIN 500 MG PO CAPS
500.0000 mg | ORAL_CAPSULE | Freq: Once | ORAL | Status: AC
  Administered 2017-02-09: 500 mg via ORAL
  Filled 2017-02-09: qty 1

## 2017-02-09 MED ORDER — FLUTICASONE PROPIONATE 50 MCG/ACT NA SUSP: NASAL | 1 refills | Status: DC

## 2017-02-09 MED ORDER — PREDNISONE 20 MG PO TABS: 20.0000 mg | ORAL_TABLET | Freq: Two times a day (BID) | ORAL | 0 refills | Status: DC

## 2017-02-09 NOTE — Discharge Instructions (Signed)
Continue daily Claritin.   New rx for steroid, antibiotic, and steroid nasal spray.

## 2017-02-09 NOTE — ED Notes (Signed)
Pt ambulated to room from waiting room, tolerated well. 

## 2017-02-09 NOTE — ED Provider Notes (Signed)
[place] DEPT Provider Note   CSN: [id number] Arrival date & time: 02/09/17  1614   By signing my name below, I, Ellen Avery, attest that this documentation has been prepared under the direction and in the presence of Tanna Furry, MD. Electronically signed, Ellen Avery, ED Scribe. 02/09/17. 6:34 PM.   History   Chief Complaint Chief Complaint  Patient presents with  . Headache   The history is provided by the patient and medical records. No language interpreter was used.    HPI Comments: Ellen Avery is a 51 y.o. female with Hx of HTN who presents to the Emergency Department complaining of worsening facial pain x 1 month. She notes congestion, sinus pressure and ear pressure. Pt seen for similar in [facility] ED previously and diagnosed with sinus infection and prescribed a seven day course of antibiotics. She notes she takes Claritin and HTN medications at home regularly.   Past Medical History:  Diagnosis Date  . Anxiety and depression   . Depression   . Diabetes mellitus without complication ([place])   . GERD (gastroesophageal reflux disease)   . Hypertension     Patient Active Problem List   Diagnosis Date Noted  . Altered mental status 03/02/2016  . MDD (major depressive disorder), recurrent episode, moderate ([place]) 03/02/2016  . Psychoses     Past Surgical History:  Procedure Laterality Date  . ABDOMINAL HYSTERECTOMY     total hysterectomy    OB History    No data available       Home Medications    Prior to Admission medications   Medication Sig Start Date End Date Taking? Authorizing Provider  acetaminophen (TYLENOL) 500 MG tablet Take 1,000 mg by mouth every 6 (six) hours as needed for mild pain.    Historical Provider, MD  amoxicillin (AMOXIL) 500 MG capsule Take 1 capsule (500 mg total) by mouth 3 (three) times daily. 02/09/17   Tanna Furry, MD  aspirin EC 81 MG tablet Take 81 mg by mouth daily.    Historical Provider, MD  cyclobenzaprine (FLEXERIL) 5 MG  tablet Take 1 tablet (5 mg total) by mouth 3 (three) times daily as needed for muscle spasms. 06/09/16   [name], NP  estradiol (ESTRACE) 1 MG tablet Take 1 tablet (1 mg total) by mouth daily. 01/23/17   Shelly Bombard, MD  fluticasone Asencion Islam) 50 MCG/ACT nasal spray 1 spray each nares bid 02/09/17   Tanna Furry, MD  insulin glargine (LANTUS) 100 UNIT/ML injection Inject 20 Units into the skin at bedtime.     Historical Provider, MD  metFORMIN (GLUCOPHAGE) 1000 MG tablet Take 1,000 mg by mouth daily with breakfast.    Historical Provider, MD  predniSONE (DELTASONE) 20 MG tablet Take 1 tablet (20 mg total) by mouth 2 (two) times daily with a meal. 02/09/17   Tanna Furry, MD  simvastatin (ZOCOR) 40 MG tablet Take 40 mg by mouth every evening. Reported on 03/01/2016    Historical Provider, MD  sodium chloride (OCEAN) 0.65 % SOLN nasal spray Place 1 spray into both nostrils as needed for congestion. 01/22/17   Varney Biles, MD    Family History History reviewed. No pertinent family history.  Social History Social History  Substance Use Topics  . Smoking status: Current Every Day Smoker    Packs/day: 1.00    Types: Cigarettes  . Smokeless tobacco: Never Used  . Alcohol use No     Allergies   Flagyl [metronidazole]   Review of Systems  Review of Systems  Constitutional: Negative for appetite change, chills, diaphoresis, fatigue and fever.  HENT: Positive for congestion, ear pain and sinus pressure. Negative for mouth sores, sore throat and trouble swallowing.   Eyes: Negative for visual disturbance.  Respiratory: Negative for cough, chest tightness, shortness of breath and wheezing.   Cardiovascular: Negative for chest pain.  Gastrointestinal: Negative for abdominal distention, abdominal pain, diarrhea, nausea and vomiting.  Endocrine: Negative for polydipsia, polyphagia and polyuria.  Genitourinary: Negative for dysuria, frequency and hematuria.  Musculoskeletal: Negative for gait  problem.  Skin: Negative for color change, pallor and rash.  Neurological: Negative for dizziness, syncope, light-headedness and headaches.  Hematological: Does not bruise/bleed easily.  Psychiatric/Behavioral: Negative for behavioral problems and confusion.     Physical Exam Updated Vital Signs BP 129/87 (BP Location: Right Arm)   Pulse 72   Temp 98.3 F (36.8 C) (Oral)   Resp 18   SpO2 100%   Physical Exam  Constitutional: She is oriented to person, place, and time. She appears well-developed and well-nourished. No distress.  HENT:  Head: Normocephalic.  Right Ear: External ear normal.  Left Ear: External ear normal.  Mouth/Throat: Oropharynx is clear and moist.  Eyes: Conjunctivae and EOM are normal. Pupils are equal, round, and reactive to light. No scleral icterus.  Tenderness over bilateral maxilla; moderate nasal congestion noted  Neck: Normal range of motion. Neck supple. No thyromegaly present.  Cardiovascular: Normal rate and regular rhythm.  Exam reveals no gallop and no friction rub.   No murmur heard. Pulmonary/Chest: Effort normal and breath sounds normal. No respiratory distress. She has no wheezes. She has no rales.  Abdominal: Soft. Bowel sounds are normal. She exhibits no distension. There is no tenderness. There is no rebound.  Musculoskeletal: Normal range of motion.  Neurological: She is alert and oriented to person, place, and time.  Skin: Skin is warm and dry. No rash noted.  Psychiatric: She has a normal mood and affect. Her behavior is normal.     ED Treatments / Results  DIAGNOSTIC STUDIES: Oxygen Saturation is 100% on RA, normal by my interpretation.    COORDINATION OF CARE: 6:32 PM Discussed treatment plan with pt at bedside and pt agreed to plan. Will order medications.  Labs (all labs ordered are listed, but only abnormal results are displayed) Labs Reviewed - No data to display  EKG  EKG Interpretation None       Radiology No  results found.  Procedures Procedures (including critical care time)  Medications Ordered in ED Medications  predniSONE (DELTASONE) tablet 60 mg (not administered)  amoxicillin (AMOXIL) capsule 500 mg (not administered)     Initial Impression / Assessment and Plan / ED Course  I have reviewed the triage vital signs and the nursing notes.  Pertinent labs & imaging results that were available during my care of the patient were reviewed by me and considered in my medical decision making (see chart for details).    I personally performed the services described in this documentation, which was scribed in my presence. The recorded information has been reviewed and is accurate.   Final Clinical Impressions(s) / ED Diagnoses   Final diagnoses:  Sinus headache   Normal HEENT exam other than sinus tenderness. Normal cranial nerve exam. Low risk  patient. Afebrile. Plan treatment for sinusitis and highly recommended primary care for follow-up.  New Prescriptions New Prescriptions   AMOXICILLIN (AMOXIL) 500 MG CAPSULE    Take 1 capsule (500 mg total)  by mouth 3 (three) times daily.   FLUTICASONE (FLONASE) 50 MCG/ACT NASAL SPRAY    1 spray each nares bid   PREDNISONE (DELTASONE) 20 MG TABLET    Take 1 tablet (20 mg total) by mouth 2 (two) times daily with a meal.     Tanna Furry, MD 02/09/17 1840

## 2017-02-09 NOTE — ED Triage Notes (Signed)
Pt sts HA and facial pain x 1 month

## 2017-03-25 ENCOUNTER — Encounter (HOSPITAL_COMMUNITY): Payer: Self-pay | Admitting: Emergency Medicine

## 2017-03-25 ENCOUNTER — Ambulatory Visit (HOSPITAL_COMMUNITY)
Admission: EM | Admit: 2017-03-25 | Discharge: 2017-03-25 | Disposition: A | Discharge status: Home / Self Care | Payer: Medicaid Other | Attending: Family Medicine | Admitting: Family Medicine

## 2017-03-25 DIAGNOSIS — L03012 Cellulitis of left finger: Secondary | ICD-10-CM

## 2017-03-25 DIAGNOSIS — J00 Acute nasopharyngitis [common cold]: Secondary | ICD-10-CM

## 2017-03-25 MED ORDER — AMOXICILLIN 875 MG PO TABS: 875.0000 mg | ORAL_TABLET | Freq: Two times a day (BID) | ORAL | 0 refills | Status: DC

## 2017-03-25 MED ORDER — IPRATROPIUM BROMIDE 0.06 % NA SOLN: 2.0000 | Freq: Four times a day (QID) | NASAL | 0 refills | Status: DC

## 2017-03-25 NOTE — ED Provider Notes (Signed)
CSN: [id number]     Arrival date & time 03/25/17  1254 History   None    Chief Complaint  Patient presents with  . Facial Pain  . Nail Problem   (Consider location/radiation/quality/duration/timing/severity/associated sxs/prior Treatment) Patient presents with c/o URI and left paronychia left thumb.   The history is provided by the patient.  URI  Presenting symptoms: congestion, fatigue and rhinorrhea   Presenting symptoms: no fever   Severity:  Moderate Onset quality:  Sudden Duration:  2 weeks Timing:  Constant Progression:  Worsening Chronicity:  New Relieved by:  Nothing   Past Medical History:  Diagnosis Date  . Anxiety and depression   . Depression   . Diabetes mellitus without complication ([place])   . GERD (gastroesophageal reflux disease)   . Hypertension    Past Surgical History:  Procedure Laterality Date  . ABDOMINAL HYSTERECTOMY     total hysterectomy   History reviewed. No pertinent family history. Social History  Substance Use Topics  . Smoking status: Current Every Day Smoker    Packs/day: 1.00    Types: Cigarettes  . Smokeless tobacco: Never Used  . Alcohol use No   OB History    No data available     Review of Systems  Constitutional: Positive for fatigue. Negative for fever.  HENT: Positive for congestion and rhinorrhea.   Eyes: Negative.   Respiratory: Negative.   Cardiovascular: Negative.   Gastrointestinal: Negative.   Endocrine: Negative.   Genitourinary: Negative.   Musculoskeletal: Negative.   Allergic/Immunologic: Negative.   Neurological: Negative.   Hematological: Negative.   Psychiatric/Behavioral: Negative.     Allergies  Flagyl [metronidazole]  Home Medications   Prior to Admission medications   Medication Sig Start Date End Date Taking? Authorizing Provider  aspirin EC 81 MG tablet Take 81 mg by mouth daily.   Yes Historical Provider, MD  insulin glargine (LANTUS) 100 UNIT/ML injection Inject 20 Units into the  skin at bedtime.    Yes Historical Provider, MD  losartan (COZAAR) 25 MG tablet Take 25 mg by mouth daily.   Yes Historical Provider, MD  metFORMIN (GLUCOPHAGE) 1000 MG tablet Take 1,000 mg by mouth daily with breakfast.   Yes Historical Provider, MD  PARoxetine (PAXIL) 20 MG tablet Take 20 mg by mouth daily.   Yes Historical Provider, MD  simvastatin (ZOCOR) 40 MG tablet Take 40 mg by mouth every evening. Reported on 03/01/2016   Yes Historical Provider, MD  amoxicillin (AMOXIL) 875 MG tablet Take 1 tablet (875 mg total) by mouth 2 (two) times daily. 03/25/17   Lysbeth Penner, FNP  cyclobenzaprine (FLEXERIL) 5 MG tablet Take 1 tablet (5 mg total) by mouth 3 (three) times daily as needed for muscle spasms. 06/09/16   [name], NP  ipratropium (ATROVENT) 0.06 % nasal spray Place 2 sprays into both nostrils 4 (four) times daily. 03/25/17   Lysbeth Penner, FNP   Meds Ordered and Administered this Visit  Medications - No data to display  BP (!) 131/92 (BP Location: Left Arm)   Pulse 88   Temp 98.2 F (36.8 C) (Oral)   Resp 20   SpO2 100%  No data found.   Physical Exam  Constitutional: She appears well-developed and well-nourished.  HENT:  Head: Normocephalic and atraumatic.  Eyes: Conjunctivae and EOM are normal. Pupils are equal, round, and reactive to light.  Neck: Normal range of motion. Neck supple.  Cardiovascular: Normal rate, regular rhythm and normal heart sounds.  Pulmonary/Chest: Effort normal and breath sounds normal.  Skin:  Left thumb paronychia  Nursing note and vitals reviewed.   Urgent Care Course     .Marland KitchenIncision and Drainage Date/Time: 03/25/2017 2:29 PM Performed by: Lysbeth Penner Authorized by: Robyn Haber   Consent:    Consent obtained:  Verbal   Consent given by:  Patient   Risks discussed:  Bleeding, incomplete drainage and pain   Alternatives discussed:  No treatment Location:    Type:  Abscess   Size:  0.5 cm   Location:  Upper  extremity   Upper extremity location:  Finger   Finger location:  L thumb Pre-procedure details:    Skin preparation:  Antiseptic wash Anesthesia (see MAR for exact dosages):    Anesthesia method:  None Procedure type:    Complexity:  Simple Procedure details:    Needle aspiration: yes     Needle size:  18 G   Incision types:  Stab incision   Incision depth:  Dermal   Wound management:  Probed and deloculated   Drainage:  Purulent   Drainage amount:  Scant   Packing materials:  None Post-procedure details:    Patient tolerance of procedure:  Tolerated well, no immediate complications   (including critical care time)  Labs Review Labs Reviewed - No data to display  Imaging Review No results found.   Visual Acuity Review  Right Eye Distance:   Left Eye Distance:   Bilateral Distance:    Right Eye Near:   Left Eye Near:    Bilateral Near:         MDM   1. Paronychia of finger of left hand   2. Acute nasopharyngitis    Incision and drainage  Amoxicillin 875mg  one po bid x 10 days  Atrovent Nasal Spray      Lysbeth Penner, FNP 03/25/17 1431

## 2017-03-25 NOTE — ED Triage Notes (Signed)
The patient presented to the [facility] with multiple complaints. The patient complained of sinus pain and pressure x 2 weeks. The patient also complained of pain, swelling and a possible paronychia to her thumb on her right hand.

## 2017-03-30 ENCOUNTER — Emergency Department (HOSPITAL_COMMUNITY): Payer: Medicaid Other

## 2017-03-30 ENCOUNTER — Encounter (HOSPITAL_COMMUNITY): Payer: Self-pay | Admitting: Emergency Medicine

## 2017-03-30 DIAGNOSIS — S46012A Strain of muscle(s) and tendon(s) of the rotator cuff of left shoulder, initial encounter: Secondary | ICD-10-CM | POA: Diagnosis not present

## 2017-03-30 DIAGNOSIS — F17210 Nicotine dependence, cigarettes, uncomplicated: Secondary | ICD-10-CM | POA: Diagnosis not present

## 2017-03-30 DIAGNOSIS — S4992XA Unspecified injury of left shoulder and upper arm, initial encounter: Secondary | ICD-10-CM | POA: Diagnosis present

## 2017-03-30 DIAGNOSIS — Z794 Long term (current) use of insulin: Secondary | ICD-10-CM | POA: Diagnosis not present

## 2017-03-30 DIAGNOSIS — X58XXXA Exposure to other specified factors, initial encounter: Secondary | ICD-10-CM | POA: Diagnosis not present

## 2017-03-30 DIAGNOSIS — Y929 Unspecified place or not applicable: Secondary | ICD-10-CM | POA: Insufficient documentation

## 2017-03-30 DIAGNOSIS — Z7982 Long term (current) use of aspirin: Secondary | ICD-10-CM | POA: Insufficient documentation

## 2017-03-30 DIAGNOSIS — Y999 Unspecified external cause status: Secondary | ICD-10-CM | POA: Insufficient documentation

## 2017-03-30 DIAGNOSIS — I10 Essential (primary) hypertension: Secondary | ICD-10-CM | POA: Diagnosis not present

## 2017-03-30 DIAGNOSIS — Z79899 Other long term (current) drug therapy: Secondary | ICD-10-CM | POA: Insufficient documentation

## 2017-03-30 DIAGNOSIS — E119 Type 2 diabetes mellitus without complications: Secondary | ICD-10-CM | POA: Insufficient documentation

## 2017-03-30 DIAGNOSIS — Y939 Activity, unspecified: Secondary | ICD-10-CM | POA: Diagnosis not present

## 2017-03-30 LAB — CBG MONITORING, ED: GLUCOSE-CAPILLARY: 149 mg/dL — AB (ref 65–99)

## 2017-03-30 NOTE — ED Triage Notes (Signed)
Pt reports that her left shoulder began to cause her pain that radiates down her left arm and across her shoulder blade.  Pts that she has "nerve damage" but this pain is different.  EMS EKG displayed NSR, CBG 173, stable vitals.

## 2017-03-31 ENCOUNTER — Emergency Department (HOSPITAL_COMMUNITY)
Admission: EM | Admit: 2017-03-31 | Discharge: 2017-03-31 | Disposition: A | Discharge status: Home / Self Care | Payer: Medicaid Other | Attending: Emergency Medicine | Admitting: Emergency Medicine

## 2017-03-31 DIAGNOSIS — S46912A Strain of unspecified muscle, fascia and tendon at shoulder and upper arm level, left arm, initial encounter: Secondary | ICD-10-CM

## 2017-03-31 MED ORDER — METHOCARBAMOL 500 MG PO TABS
500.0000 mg | ORAL_TABLET | Freq: Once | ORAL | Status: AC
  Administered 2017-03-31: 500 mg via ORAL
  Filled 2017-03-31: qty 1

## 2017-03-31 MED ORDER — METHOCARBAMOL 500 MG PO TABS: 500.0000 mg | ORAL_TABLET | Freq: Two times a day (BID) | ORAL | 0 refills | Status: DC

## 2017-03-31 MED ORDER — HYDROCODONE-ACETAMINOPHEN 5-325 MG PO TABS
1.0000 | ORAL_TABLET | Freq: Once | ORAL | Status: AC
  Administered 2017-03-31: 1 via ORAL
  Filled 2017-03-31: qty 1

## 2017-03-31 MED ORDER — DICLOFENAC SODIUM 50 MG PO TBEC: 50.0000 mg | DELAYED_RELEASE_TABLET | Freq: Two times a day (BID) | ORAL | 0 refills | Status: DC

## 2017-03-31 NOTE — Discharge Instructions (Signed)
Take the medication as directed. Do not drive while taking the muscle relaxant as it will make you sleepy. Follow up with your doctor. Return here as needed.

## 2017-03-31 NOTE — ED Notes (Signed)
Pt understood dc material. NAD noted. Scripts given at dc 

## 2017-03-31 NOTE — ED Provider Notes (Signed)
[place] DEPT Provider Note   CSN: [id number] Arrival date & time: 03/30/17  2059     History   Chief Complaint Chief Complaint  Patient presents with  . Shoulder Pain    HPI Ellen Avery is a 51 y.o. female who presents to the ED with left shoulder pain that started 3 days ago. She reports hx of cervical disc disease and chronic neck pain that sometimes causes pain in her shoulders. The pain has been 8/10 at worst and now is 6/10. She has taken tylenol for pain that helped a little. Patient states she has been sleeping sitting in a chair and watching TV and wonders if she slept wrong and caused the pain.   The history is provided by the patient. No language interpreter was used.  Shoulder Pain   This is a new problem. The current episode started more than 2 days ago. The problem occurs constantly. The problem has been gradually worsening. The pain is present in the left arm. Pertinent negatives include no numbness.    Past Medical History:  Diagnosis Date  . Anxiety and depression   . Depression   . Diabetes mellitus without complication ([place])   . GERD (gastroesophageal reflux disease)   . Hypertension     Patient Active Problem List   Diagnosis Date Noted  . Altered mental status 03/02/2016  . MDD (major depressive disorder), recurrent episode, moderate ([place]) 03/02/2016  . Psychoses     Past Surgical History:  Procedure Laterality Date  . ABDOMINAL HYSTERECTOMY     total hysterectomy    OB History    No data available       Home Medications    Prior to Admission medications   Medication Sig Start Date End Date Taking? Authorizing Provider  amoxicillin (AMOXIL) 875 MG tablet Take 1 tablet (875 mg total) by mouth 2 (two) times daily. 03/25/17   Lysbeth Penner, FNP  aspirin EC 81 MG tablet Take 81 mg by mouth daily.    Historical Provider, MD  diclofenac (VOLTAREN) 50 MG EC tablet Take 1 tablet (50 mg total) by mouth 2 (two) times daily. 03/31/17   Narciso [place]enburg Bunnie Pion, NP  insulin glargine (LANTUS) 100 UNIT/ML injection Inject 20 Units into the skin at bedtime.     Historical Provider, MD  ipratropium (ATROVENT) 0.06 % nasal spray Place 2 sprays into both nostrils 4 (four) times daily. 03/25/17   Lysbeth Penner, FNP  losartan (COZAAR) 25 MG tablet Take 25 mg by mouth daily.    Historical Provider, MD  metFORMIN (GLUCOPHAGE) 1000 MG tablet Take 1,000 mg by mouth daily with breakfast.    Historical Provider, MD  methocarbamol (ROBAXIN) 500 MG tablet Take 1 tablet (500 mg total) by mouth 2 (two) times daily. 03/31/17   Jacqualine Weichel Bunnie Pion, NP  PARoxetine (PAXIL) 20 MG tablet Take 20 mg by mouth daily.    Historical Provider, MD  simvastatin (ZOCOR) 40 MG tablet Take 40 mg by mouth every evening. Reported on 03/01/2016    Historical Provider, MD    Family History No family history on file.  Social History Social History  Substance Use Topics  . Smoking status: Current Every Day Smoker    Packs/day: 1.00    Types: Cigarettes  . Smokeless tobacco: Never Used  . Alcohol use No     Allergies   Flagyl [metronidazole]   Review of Systems Review of Systems  Respiratory: Negative for shortness of breath.   Cardiovascular: Negative  for chest pain.  Musculoskeletal: Positive for arthralgias and neck pain (chronic). Negative for back pain and gait problem.       Left shoulder  Skin: Negative for wound.  Neurological: Negative for weakness and numbness.     Physical Exam Updated Vital Signs BP 118/74   Pulse 68   Temp 98.6 F (37 C) (Oral)   Resp 20   Ht 5\' 7"  (1.702 m)   Wt 111.6 kg   SpO2 98%   BMI 38.53 kg/m   Physical Exam  Constitutional: She is oriented to person, place, and time. She appears well-developed and well-nourished. No distress.  HENT:  Head: Normocephalic and atraumatic.  Eyes: EOM are normal.  Neck: Neck supple.  Cardiovascular: Normal rate and regular rhythm.   Pulmonary/Chest: Effort normal and breath sounds normal.    Abdominal: Soft. There is no tenderness.  Musculoskeletal:       Left shoulder: She exhibits tenderness, laceration, pain and spasm. She exhibits normal range of motion, no crepitus, no deformity, normal pulse and normal strength.  Radial pulses 2+, adequate circulation, equal grips.   Neurological: She is alert and oriented to person, place, and time.  Skin: Skin is warm and dry.  Psychiatric: She has a normal mood and affect. Her behavior is normal.  Nursing note and vitals reviewed.    ED Treatments / Results  Labs (all labs ordered are listed, but only abnormal results are displayed) Labs Reviewed  CBG MONITORING, ED - Abnormal; Notable for the following:       Result Value   Glucose-Capillary 149 (*)    All other components within normal limits    Radiology Dg Shoulder Left  Result Date: 03/30/2017 CLINICAL DATA:  LEFT shoulder pain for 3 days, no injury. EXAM: LEFT SHOULDER - 2+ VIEW COMPARISON:  None. FINDINGS: The humeral head is well-formed and located. The subacromial, glenohumeral and acromioclavicular joint spaces are intact. No destructive bony lesions. Soft tissue planes are non-suspicious. IMPRESSION: Negative. Electronically Signed   By: Elon Alas M.D.   On: 03/30/2017 21:58    Procedures Procedures (including critical care time)  Medications Ordered in ED Medications  methocarbamol (ROBAXIN) tablet 500 mg (500 mg Oral Given 03/31/17 0048)  HYDROcodone-acetaminophen (NORCO/VICODIN) 5-325 MG per tablet 1 tablet (1 tablet Oral Given 03/31/17 0048)     Initial Impression / Assessment and Plan / ED Course  I have reviewed the triage vital signs and the nursing notes.  Pertinent imaging results that were available during my care of the patient were reviewed by me and considered in my medical decision making (see chart for details).   Final Clinical Impressions(s) / ED Diagnoses  51 y.o. female with left shoulder pain that increases with range of motion  and palpation stable for d/c without focal neuro deficits. Will treat for muscle spasm and she will f/u with her doctor. Return precautions discussed.  Final diagnoses:  Strain of left shoulder, initial encounter    New Prescriptions Discharge Medication List as of 03/31/2017 12:39 AM    START taking these medications   Details  diclofenac (VOLTAREN) 50 MG EC tablet Take 1 tablet (50 mg total) by mouth 2 (two) times daily., Starting Tue 03/31/2017, Print    methocarbamol (ROBAXIN) 500 MG tablet Take 1 tablet (500 mg total) by mouth 2 (two) times daily., Starting Tue 03/31/2017, [place], NP 04/01/17 2956    Ripley Fraise, MD 04/03/17 [phone]

## 2017-06-15 ENCOUNTER — Ambulatory Visit (HOSPITAL_COMMUNITY)
Admission: EM | Admit: 2017-06-15 | Discharge: 2017-06-15 | Disposition: A | Discharge status: Home / Self Care | Payer: Medicaid Other | Attending: Internal Medicine | Admitting: Internal Medicine

## 2017-06-15 ENCOUNTER — Encounter (HOSPITAL_COMMUNITY): Payer: Self-pay | Admitting: Emergency Medicine

## 2017-06-15 DIAGNOSIS — J301 Allergic rhinitis due to pollen: Secondary | ICD-10-CM | POA: Diagnosis not present

## 2017-06-15 DIAGNOSIS — H0014 Chalazion left upper eyelid: Secondary | ICD-10-CM | POA: Diagnosis not present

## 2017-06-15 DIAGNOSIS — J309 Allergic rhinitis, unspecified: Secondary | ICD-10-CM

## 2017-06-15 MED ORDER — TRIAMCINOLONE ACETONIDE 40 MG/ML IJ SUSP
INTRAMUSCULAR | Status: AC
  Filled 2017-06-15: qty 1

## 2017-06-15 MED ORDER — TRIAMCINOLONE ACETONIDE 40 MG/ML IJ SUSP
40.0000 mg | Freq: Once | INTRAMUSCULAR | Status: AC
  Administered 2017-06-15: 40 mg via INTRAMUSCULAR

## 2017-06-15 MED ORDER — PREDNISONE 50 MG PO TABS: ORAL_TABLET | ORAL | 0 refills | Status: DC

## 2017-06-15 NOTE — ED Provider Notes (Signed)
CSN: [id number]     Arrival date & time 06/15/17  1742 History   First MD Initiated Contact with Patient 06/15/17 1832     Chief Complaint  Patient presents with  . URI   (Consider location/radiation/quality/duration/timing/severity/associated sxs/prior Treatment) 51 year old female complaining of a one and half week history of sinus pressure primarily on the left and generalized headache associated with stuffy nose, ears feeling full. She has taken Zyrtec and Benadryl without any relief. Denies fever. She has a history of seasonal allergies. She is a type II diabetic that takes metformin and Lantus.      Past Medical History:  Diagnosis Date  . Anxiety and depression   . Depression   . Diabetes mellitus without complication ([place])   . GERD (gastroesophageal reflux disease)   . Hypertension    Past Surgical History:  Procedure Laterality Date  . ABDOMINAL HYSTERECTOMY     total hysterectomy   No family history on file. Social History  Substance Use Topics  . Smoking status: Current Every Day Smoker    Packs/day: 1.00    Types: Cigarettes  . Smokeless tobacco: Never Used  . Alcohol use No   OB History    No data available     Review of Systems  Constitutional: Negative.  Negative for activity change, appetite change, chills, fatigue and fever.  HENT: Positive for congestion and sinus pressure. Negative for facial swelling, postnasal drip, rhinorrhea and sore throat.   Eyes: Negative.   Respiratory: Negative.   Cardiovascular: Negative.   Musculoskeletal: Negative for neck pain and neck stiffness.  Skin: Negative for pallor and rash.  Neurological: Negative.   All other systems reviewed and are negative.   Allergies  Flagyl [metronidazole]  Home Medications   Prior to Admission medications   Medication Sig Start Date End Date Taking? Authorizing Provider  aspirin EC 81 MG tablet Take 81 mg by mouth daily.    [provider]  insulin glargine (LANTUS)  100 UNIT/ML injection Inject 20 Units into the skin at bedtime.     [provider]  losartan (COZAAR) 25 MG tablet Take 25 mg by mouth daily.    [provider]  metFORMIN (GLUCOPHAGE) 1000 MG tablet Take 1,000 mg by mouth daily with breakfast.    [provider]  methocarbamol (ROBAXIN) 500 MG tablet Take 1 tablet (500 mg total) by mouth 2 (two) times daily. 03/31/17   Ashley Murrain, NP  PARoxetine (PAXIL) 20 MG tablet Take 20 mg by mouth daily.    [provider]  predniSONE (DELTASONE) 50 MG tablet 1 tab po daily for 5 days. Take with food. Start 06/16/2017. 06/15/17   Janne Napoleon, NP  simvastatin (ZOCOR) 40 MG tablet Take 40 mg by mouth every evening. Reported on 03/01/2016    [provider]   Meds Ordered and Administered this Visit   Medications  triamcinolone acetonide (KENALOG-40) injection 40 mg (not administered)    BP (!) 146/89 (BP Location: Right Arm) Comment: large cuff  Pulse 74   Temp 98.6 F (37 C) (Oral)   Resp 18   SpO2 100%  No data found.   Physical Exam  Constitutional: She is oriented to person, place, and time. She appears well-developed and well-nourished. No distress.  HENT:  Bilateral TMs are mildly retracted, no effusion or erythema.  Oropharynx with minor erythema no cobblestoning or exudate.  Eyes: EOM are normal.  Neck: Normal range of motion. Neck supple.  Cardiovascular: Normal rate and  regular rhythm.   Pulmonary/Chest: Effort normal and breath sounds normal. No respiratory distress. She has no wheezes.  Musculoskeletal: Normal range of motion. She exhibits no edema.  Lymphadenopathy:    She has no cervical adenopathy.  Neurological: She is alert and oriented to person, place, and time.  Skin: Skin is warm and dry. No rash noted.  Psychiatric: She has a normal mood and affect.  Nursing note and vitals reviewed.   Urgent Care Course     Procedures (including critical care time)  Labs Review Labs  Reviewed - No data to display  Imaging Review No results found.   Visual Acuity Review  Right Eye Distance:   Left Eye Distance:   Bilateral Distance:    Right Eye Near:   Left Eye Near:    Bilateral Near:         MDM   1. Seasonal allergic rhinitis due to pollen   2. Allergic sinusitis   3. Chalazion left upper eyelid    Sudafed PE 10 mg every 4 to 6 hours as needed for congestion Allegra or Zyrtec daily as needed for drainage and runny nose. Saline nasal spray used frequently. Drink plenty of fluids and stay well-hydrated. Flonase or Rhinocort nasal spray daily Prednisone as directed. Check blood sugars and adjust insulin up as directed as needed. Meds ordered this encounter  Medications  . predniSONE (DELTASONE) 50 MG tablet    Sig: 1 tab po daily for 5 days. Take with food. Start 06/16/2017.    Dispense:  5 tablet    Refill:  0    Order Specific Question:   Supervising Provider    Answer:   Sherlene Shams [[id number]]  . triamcinolone acetonide (KENALOG-40) injection 40 mg      Janne Napoleon, NP 06/15/17 1850

## 2017-06-15 NOTE — Discharge Instructions (Signed)
Sudafed PE 10 mg every 4 to 6 hours as needed for congestion Allegra or Zyrtec daily as needed for drainage and runny nose. Saline nasal spray used frequently. Drink plenty of fluids and stay well-hydrated. Flonase or Rhinocort nasal spray daily Prednisone as directed. Check blood sugars and adjust insulin up as directed as needed.

## 2017-06-15 NOTE — ED Triage Notes (Signed)
1 1/2 weeks ago, patient started having cough, bilateral ear pain, left ear is more painful, sore throat, aching in general

## 2017-06-24 ENCOUNTER — Encounter (HOSPITAL_COMMUNITY): Payer: Self-pay | Admitting: Family Medicine

## 2017-06-24 ENCOUNTER — Ambulatory Visit (HOSPITAL_COMMUNITY)
Admission: EM | Admit: 2017-06-24 | Discharge: 2017-06-24 | Disposition: A | Discharge status: Home / Self Care | Payer: Medicaid Other | Attending: Internal Medicine | Admitting: Internal Medicine

## 2017-06-24 DIAGNOSIS — H00014 Hordeolum externum left upper eyelid: Secondary | ICD-10-CM

## 2017-06-24 DIAGNOSIS — J324 Chronic pansinusitis: Secondary | ICD-10-CM

## 2017-06-24 MED ORDER — AMOXICILLIN-POT CLAVULANATE 875-125 MG PO TABS: 1.0000 | ORAL_TABLET | Freq: Two times a day (BID) | ORAL | 0 refills | Status: AC

## 2017-06-24 MED ORDER — ERYTHROMYCIN 2 % EX OINT: TOPICAL_OINTMENT | CUTANEOUS | 0 refills | Status: DC

## 2017-06-24 NOTE — ED Triage Notes (Signed)
Pt here for bump to the left eye.

## 2017-06-24 NOTE — ED Provider Notes (Signed)
CSN: [id number]     Arrival date & time 06/24/17  1123 History   None    Chief Complaint  Patient presents with  . Eye Problem   (Consider location/radiation/quality/duration/timing/severity/associated sxs/prior Treatment) 51 year old female with history of diabetes, hypertension, allergies, comes in with left eyelid bump, and sinusitis. She states she has had the left eyelid bump before, use warm compresses which decreased the swelling, but bump has returned, and is now painful and irritating. She states she experiences itchiness along the eyelash. No changes in vision, photophobia, redness to the eye, eye discharge. Patient was in this clinic last week, for sinus pressure and pain. Treatment given then has helped minimally. Denies fever, chills, night sweats. Denies trouble swallowing, breathing, some in the throat. Continues to have nasal congestion, ear fullness, and sinus pain.      Past Medical History:  Diagnosis Date  . Anxiety and depression   . Depression   . Diabetes mellitus without complication ([place])   . GERD (gastroesophageal reflux disease)   . Hypertension    Past Surgical History:  Procedure Laterality Date  . ABDOMINAL HYSTERECTOMY     total hysterectomy   History reviewed. No pertinent family history. Social History  Substance Use Topics  . Smoking status: Current Every Day Smoker    Packs/day: 1.00    Types: Cigarettes  . Smokeless tobacco: Never Used  . Alcohol use No   OB History    No data available     Review of Systems  Reason unable to perform ROS: See HPI above.    Allergies  Flagyl [metronidazole]  Home Medications   Prior to Admission medications   Medication Sig Start Date End Date Taking? Authorizing Provider  amoxicillin-clavulanate (AUGMENTIN) 875-125 MG tablet Take 1 tablet by mouth 2 (two) times daily. 06/24/17 07/01/17  Ok Edwards, PA-C  aspirin EC 81 MG tablet Take 81 mg by mouth daily.    [provider]  Erythromycin 2  % ointment Apply to affected area 2 times daily 06/24/17   Tasia Catchings, Letitia Sabala V, PA-C  insulin glargine (LANTUS) 100 UNIT/ML injection Inject 20 Units into the skin at bedtime.     [provider]  losartan (COZAAR) 25 MG tablet Take 25 mg by mouth daily.    [provider]  metFORMIN (GLUCOPHAGE) 1000 MG tablet Take 1,000 mg by mouth daily with breakfast.    [provider]  methocarbamol (ROBAXIN) 500 MG tablet Take 1 tablet (500 mg total) by mouth 2 (two) times daily. 03/31/17   Ashley Murrain, NP  PARoxetine (PAXIL) 20 MG tablet Take 20 mg by mouth daily.    [provider]  predniSONE (DELTASONE) 50 MG tablet 1 tab po daily for 5 days. Take with food. Start 06/16/2017. 06/15/17   Janne Napoleon, NP  simvastatin (ZOCOR) 40 MG tablet Take 40 mg by mouth every evening. Reported on 03/01/2016    [provider]   Meds Ordered and Administered this Visit  Medications - No data to display  BP 129/86   Pulse 70   Temp 98.4 F (36.9 C)   Resp 18   SpO2 100%  No data found.   Physical Exam  Constitutional: She appears well-developed and well-nourished. No distress.  HENT:  Head: Normocephalic and atraumatic.  Right Ear: Tympanic membrane, external ear and ear canal normal.  Left Ear: Tympanic membrane, external ear and ear canal normal.  Nose: Right sinus exhibits maxillary sinus tenderness and frontal sinus tenderness. Left sinus  exhibits maxillary sinus tenderness and frontal sinus tenderness.  Mouth/Throat: Uvula is midline, oropharynx is clear and moist and mucous membranes are normal.  Eyes: Pupils are equal, round, and reactive to light. Conjunctivae and EOM are normal. Right eye exhibits no hordeolum. Left eye exhibits hordeolum. Right conjunctiva is not injected. Left conjunctiva is not injected.    Urgent Care Course     Procedures (including critical care time)  Labs Review Labs Reviewed - No data to display  Imaging Review No results  found.     MDM   1. Hordeolum externum of left upper eyelid   2. Chronic pansinusitis    Given patient has been trying warm compresses and lid scrubs without relief, will start erythromycin ointment as directed. Ophthalmology resources given to patient. Patient with more than 1-2 weeks for sinusitis. Given patient using nasal sprays, decongestants, allergy medicines without relief, start Augmentin as directed. Side effects of medicines discussed with patient. If notice any allergies to medicine, to stop medicines and contact PCP.    Ok Edwards, PA-C 06/24/17 1420

## 2017-08-11 ENCOUNTER — Encounter (HOSPITAL_COMMUNITY): Payer: Self-pay

## 2017-08-11 ENCOUNTER — Emergency Department (HOSPITAL_COMMUNITY)
Admission: EM | Admit: 2017-08-11 | Discharge: 2017-08-11 | Disposition: A | Discharge status: Home / Self Care | Payer: Medicaid Other | Attending: Emergency Medicine | Admitting: Emergency Medicine

## 2017-08-11 DIAGNOSIS — Z794 Long term (current) use of insulin: Secondary | ICD-10-CM | POA: Diagnosis not present

## 2017-08-11 DIAGNOSIS — F17210 Nicotine dependence, cigarettes, uncomplicated: Secondary | ICD-10-CM | POA: Diagnosis not present

## 2017-08-11 DIAGNOSIS — Z7982 Long term (current) use of aspirin: Secondary | ICD-10-CM | POA: Diagnosis not present

## 2017-08-11 DIAGNOSIS — I10 Essential (primary) hypertension: Secondary | ICD-10-CM | POA: Insufficient documentation

## 2017-08-11 DIAGNOSIS — Z79899 Other long term (current) drug therapy: Secondary | ICD-10-CM | POA: Insufficient documentation

## 2017-08-11 DIAGNOSIS — G501 Atypical facial pain: Secondary | ICD-10-CM | POA: Diagnosis present

## 2017-08-11 DIAGNOSIS — J0100 Acute maxillary sinusitis, unspecified: Secondary | ICD-10-CM | POA: Insufficient documentation

## 2017-08-11 DIAGNOSIS — E119 Type 2 diabetes mellitus without complications: Secondary | ICD-10-CM | POA: Insufficient documentation

## 2017-08-11 MED ORDER — DOXYCYCLINE HYCLATE 100 MG PO CAPS: 100.0000 mg | ORAL_CAPSULE | Freq: Two times a day (BID) | ORAL | 0 refills | Status: DC

## 2017-08-11 MED ORDER — PREDNISONE 10 MG PO TABS: 40.0000 mg | ORAL_TABLET | Freq: Every day | ORAL | 0 refills | Status: AC

## 2017-08-11 MED ORDER — DEXAMETHASONE SODIUM PHOSPHATE 10 MG/ML IJ SOLN
10.0000 mg | Freq: Once | INTRAMUSCULAR | Status: AC
  Administered 2017-08-11: 10 mg via INTRAMUSCULAR
  Filled 2017-08-11: qty 1

## 2017-08-11 NOTE — Discharge Instructions (Signed)
It is important that you stay well-hydrated. Make sure to drink lots of water. Continue to take your allergy medicine, use the nasal saline spray, and to take Mucinex. Take prednisone and doxycycline as prescribed. Make sure to check your blood sugar, as steroids can increase your blood sugar levels. If your blood sugar gets too high, stop taking the steroid and follow-up with your primary care doctor. It is important that you follow up with your primary care doctor in 1 week to make sure your symptoms are resolving. Return to the emergency room if you develop fevers, worsening symptoms, chest pain, shortness of breath, or any new or worsening symptoms.

## 2017-08-11 NOTE — ED Provider Notes (Signed)
[facility] DEPT Provider Note   CSN: [id number] Arrival date & time: 08/11/17  1647     History   Chief Complaint Chief Complaint  Patient presents with  . Facial Pain    HPI Ellen Avery is a 51 y.o. female presenting with nasal congestion, facial pain, and cough.  Patient states that for the past 3 weeks, she's had the above symptoms. She has a history of seasonal allergies, for which she takes Zyrtec daily. She has been trying to manage her symptoms with Zyrtec, nasal saline spray, and Mucinex. She was seen in urgent care several weeks ago for the same, and given a prescription for steroids and antibiotics. She reports her symptoms improved, but when she stopped taking those medicines, her symptoms returned. Her cough is worse at night when she first wakes up in the morning. She has associated sore throat when she coughs. She states that when she took the steroids, her blood sugars elevated minimally, but were not much different than normal. She denies fever, chills, eye pain, ear pain, neck stiffness, chest pain, shortness of breath, nausea, vomiting, or abdominal pain. She states that she often gets these symptoms when the seasons change.  HPI  Past Medical History:  Diagnosis Date  . Anxiety and depression   . Depression   . Diabetes mellitus without complication ([place])   . GERD (gastroesophageal reflux disease)   . Hypertension     Patient Active Problem List   Diagnosis Date Noted  . Altered mental status 03/02/2016  . MDD (major depressive disorder), recurrent episode, moderate ([place]) 03/02/2016  . Psychoses     Past Surgical History:  Procedure Laterality Date  . ABDOMINAL HYSTERECTOMY     total hysterectomy    OB History    No data available       Home Medications    Prior to Admission medications   Medication Sig Start Date End Date Taking? Authorizing Provider  aspirin EC 81 MG tablet Take 81 mg by mouth daily.    [provider]    doxycycline (VIBRAMYCIN) 100 MG capsule Take 1 capsule (100 mg total) by mouth 2 (two) times daily. 08/11/17   Laurine Kuyper, PA-C  Erythromycin 2 % ointment Apply to affected area 2 times daily 06/24/17   Tasia Catchings, Amy V, PA-C  insulin glargine (LANTUS) 100 UNIT/ML injection Inject 20 Units into the skin at bedtime.     [provider]  losartan (COZAAR) 25 MG tablet Take 25 mg by mouth daily.    [provider]  metFORMIN (GLUCOPHAGE) 1000 MG tablet Take 1,000 mg by mouth daily with breakfast.    [provider]  methocarbamol (ROBAXIN) 500 MG tablet Take 1 tablet (500 mg total) by mouth 2 (two) times daily. 03/31/17   Ashley Murrain, NP  PARoxetine (PAXIL) 20 MG tablet Take 20 mg by mouth daily.    [provider]  predniSONE (DELTASONE) 10 MG tablet Take 4 tablets (40 mg total) by mouth daily. 08/11/17 08/15/17  Geraldin Habermehl, PA-C  simvastatin (ZOCOR) 40 MG tablet Take 40 mg by mouth every evening. Reported on 03/01/2016    [provider]    Family History History reviewed. No pertinent family history.  Social History Social History  Substance Use Topics  . Smoking status: Current Every Day Smoker    Packs/day: 1.00    Types: Cigarettes  . Smokeless tobacco: Never Used  . Alcohol use No     Allergies   Flagyl [metronidazole]  Review of Systems Review of Systems  Constitutional: Negative for chills and fever.  HENT: Positive for congestion, postnasal drip, sinus pain, sinus pressure and sore throat. Negative for tinnitus and trouble swallowing.   Eyes: Negative for photophobia, pain, discharge and visual disturbance.  Respiratory: Positive for cough. Negative for chest tightness and shortness of breath.   Cardiovascular: Negative for chest pain and palpitations.  Gastrointestinal: Negative for abdominal pain, constipation, diarrhea, nausea and vomiting.     Physical Exam Updated Vital Signs BP (!) 138/95   Pulse 74   Temp 98.6  F (37 C) (Oral)   Resp 16   SpO2 96%   Physical Exam  Constitutional: She is oriented to person, place, and time. She appears well-developed and well-nourished. No distress.  HENT:  Head: Normocephalic and atraumatic.  Right Ear: Tympanic membrane, external ear and ear canal normal.  Left Ear: Tympanic membrane, external ear and ear canal normal.  Nose: Mucosal edema and rhinorrhea present. Right sinus exhibits maxillary sinus tenderness. Right sinus exhibits no frontal sinus tenderness. Left sinus exhibits maxillary sinus tenderness. Left sinus exhibits no frontal sinus tenderness.  Mouth/Throat: Uvula is midline, oropharynx is clear and moist and mucous membranes are normal.  Eyes: Pupils are equal, round, and reactive to light. Conjunctivae and EOM are normal. Right eye exhibits no discharge. Left eye exhibits no discharge.  Neck: Normal range of motion.  Cardiovascular: Normal rate, regular rhythm and intact distal pulses.   Pulmonary/Chest: Effort normal and breath sounds normal. No respiratory distress. She has no decreased breath sounds. She has no wheezes. She has no rhonchi. She has no rales.  Abdominal: Soft. She exhibits no distension. There is no tenderness.  Musculoskeletal: Normal range of motion.  Lymphadenopathy:    She has no cervical adenopathy.  Neurological: She is alert and oriented to person, place, and time.  Skin: Skin is warm. No rash noted.  Psychiatric: She has a normal mood and affect.  Nursing note and vitals reviewed.    ED Treatments / Results  Labs (all labs ordered are listed, but only abnormal results are displayed) Labs Reviewed - No data to display  EKG  EKG Interpretation None       Radiology No results found.  Procedures Procedures (including critical care time)  Medications Ordered in ED Medications  dexamethasone (DECADRON) injection 10 mg (not administered)     Initial Impression / Assessment and Plan / ED Course  I have  reviewed the triage vital signs and the nursing notes.  Pertinent labs & imaging results that were available during my care of the patient were reviewed by me and considered in my medical decision making (see chart for details).     Pt presenting with 3 weeks of facial pain, congestion, and cough. No fevers or chills. Physical exam reassuring, as patient is afebrile and not tachycardic. Lung sounds clear in all fields, no OP or tonsillar exudate. Tenderness of maxillary sinuses. Doubt pneumonia. Likely sinusitis. As patient has had symptoms for 3 weeks, will treat with steroids and antibiotics. Discussed risks of using steroids as patient is diabetic, patient states that she has had steroids in the past without adverse effects. She would like a prescription for steroids today. Discussed importance of follow-up with primary care. At this time, patient appears safe for discharge. Strict return precautions given. Patient states she understands and agrees to plan.  Final Clinical Impressions(s) / ED Diagnoses   Final diagnoses:  Acute maxillary sinusitis, recurrence not specified  New Prescriptions New Prescriptions   DOXYCYCLINE (VIBRAMYCIN) 100 MG CAPSULE    Take 1 capsule (100 mg total) by mouth 2 (two) times daily.   PREDNISONE (DELTASONE) 10 MG TABLET    Take 4 tablets (40 mg total) by mouth daily.     Franchot Heidelberg, PA-C 08/11/17 1823    Noemi Chapel, MD 08/12/17 [phone]

## 2017-08-11 NOTE — ED Triage Notes (Signed)
Pt reports she has had sinus pressure X2 weeks. Pt has tried mucinex at home without relief. Pt alert and oriented. Reports she has also had cough and sore throat.

## 2017-08-31 ENCOUNTER — Emergency Department (HOSPITAL_COMMUNITY)
Admission: EM | Admit: 2017-08-31 | Discharge: 2017-08-31 | Disposition: A | Discharge status: Home / Self Care | Payer: Medicaid Other | Attending: Emergency Medicine | Admitting: Emergency Medicine

## 2017-08-31 ENCOUNTER — Encounter (HOSPITAL_COMMUNITY): Payer: Self-pay | Admitting: Emergency Medicine

## 2017-08-31 DIAGNOSIS — Y92019 Unspecified place in single-family (private) house as the place of occurrence of the external cause: Secondary | ICD-10-CM | POA: Insufficient documentation

## 2017-08-31 DIAGNOSIS — Y9389 Activity, other specified: Secondary | ICD-10-CM | POA: Diagnosis not present

## 2017-08-31 DIAGNOSIS — X500XXA Overexertion from strenuous movement or load, initial encounter: Secondary | ICD-10-CM | POA: Insufficient documentation

## 2017-08-31 DIAGNOSIS — F329 Major depressive disorder, single episode, unspecified: Secondary | ICD-10-CM | POA: Diagnosis not present

## 2017-08-31 DIAGNOSIS — Z79899 Other long term (current) drug therapy: Secondary | ICD-10-CM | POA: Insufficient documentation

## 2017-08-31 DIAGNOSIS — S161XXA Strain of muscle, fascia and tendon at neck level, initial encounter: Secondary | ICD-10-CM | POA: Insufficient documentation

## 2017-08-31 DIAGNOSIS — I10 Essential (primary) hypertension: Secondary | ICD-10-CM | POA: Insufficient documentation

## 2017-08-31 DIAGNOSIS — E119 Type 2 diabetes mellitus without complications: Secondary | ICD-10-CM | POA: Insufficient documentation

## 2017-08-31 DIAGNOSIS — R0981 Nasal congestion: Secondary | ICD-10-CM | POA: Diagnosis present

## 2017-08-31 DIAGNOSIS — Z794 Long term (current) use of insulin: Secondary | ICD-10-CM | POA: Insufficient documentation

## 2017-08-31 DIAGNOSIS — Z7982 Long term (current) use of aspirin: Secondary | ICD-10-CM | POA: Insufficient documentation

## 2017-08-31 DIAGNOSIS — Y998 Other external cause status: Secondary | ICD-10-CM | POA: Insufficient documentation

## 2017-08-31 DIAGNOSIS — F419 Anxiety disorder, unspecified: Secondary | ICD-10-CM | POA: Insufficient documentation

## 2017-08-31 DIAGNOSIS — J069 Acute upper respiratory infection, unspecified: Secondary | ICD-10-CM | POA: Diagnosis not present

## 2017-08-31 DIAGNOSIS — F17210 Nicotine dependence, cigarettes, uncomplicated: Secondary | ICD-10-CM | POA: Insufficient documentation

## 2017-08-31 MED ORDER — CYCLOBENZAPRINE HCL 10 MG PO TABS: 10.0000 mg | ORAL_TABLET | Freq: Two times a day (BID) | ORAL | 0 refills | Status: DC | PRN

## 2017-08-31 MED ORDER — AMOXICILLIN-POT CLAVULANATE 875-125 MG PO TABS: 1.0000 | ORAL_TABLET | Freq: Two times a day (BID) | ORAL | 0 refills | Status: DC

## 2017-08-31 NOTE — ED Triage Notes (Signed)
Pt. Stated, I've had a sinus infection for over 10 days with face pain, congestion, and also I was moving something this weekend and her my neck in the cervical area where I have problems anyway.

## 2017-08-31 NOTE — Discharge Instructions (Signed)
It was my pleasure taking care of you today!   Please take all of your antibiotics until finished!   Alternate between Tylenol and ibuprofen as needed for body aches. Flexeril is your muscle relaxer.   Rest, drink plenty of fluids to be sure you are staying hydrated.   Please follow up with your primary doctor for discussion of your diagnoses and further evaluation after today's visit if symptoms persist longer than 7 days; Return to the ER for high fevers, difficulty breathing or other concerning symptoms

## 2017-08-31 NOTE — ED Provider Notes (Signed)
[facility] DEPT Provider Note   CSN: [id number] Arrival date & time: 08/31/17  1703     History   Chief Complaint Chief Complaint  Patient presents with  . Facial Pain  . Nasal Congestion  . Neck Pain    HPI Ellen Avery is a 51 y.o. female.  The history is provided by the patient and medical records. No language interpreter was used.  Neck Pain   Pertinent negatives include no chest pain.   Ellen Avery is a 51 y.o. female  with a PMH of DM, GERD, HTN who presents to the Emergency Department complaining of facial pain (left >right) and nasal congestion x 10 days. Associated with a dry cough, typically at night. Patient states that she was seen for similar earlier in the month and given rx. Per chart review, she was seen on 9/04 and started on doxycyline. She states that she did notice some improvement, but did not feel like her symptoms completely resolved. No fever or chills. Associated with dry cough, mostly at night. No known sick contacts.   Patient also endorses pain to the left aspect of her neck. She has been moving things at her house and believes she pulled a muscle. She notes hx of similar which improved with muscle relaxants in the past. She has tried OTC topical cream with little improvement. No numbness or tingling. Wose with certain movements and palpation.   Past Medical History:  Diagnosis Date  . Anxiety and depression   . Depression   . Diabetes mellitus without complication ([place])   . GERD (gastroesophageal reflux disease)   . Hypertension     Patient Active Problem List   Diagnosis Date Noted  . Altered mental status 03/02/2016  . MDD (major depressive disorder), recurrent episode, moderate ([place]) 03/02/2016  . Psychoses     Past Surgical History:  Procedure Laterality Date  . ABDOMINAL HYSTERECTOMY     total hysterectomy    OB History    No data available       Home Medications    Prior to Admission medications   Medication Sig Start  Date End Date Taking? Authorizing Provider  amoxicillin-clavulanate (AUGMENTIN) 875-125 MG tablet Take 1 tablet by mouth every 12 (twelve) hours. 08/31/17   Sativa Gelles, Ozella Almond, PA-C  aspirin EC 81 MG tablet Take 81 mg by mouth daily.    [provider]  cyclobenzaprine (FLEXERIL) 10 MG tablet Take 1 tablet (10 mg total) by mouth 2 (two) times daily as needed for muscle spasms. 08/31/17   Hydee Fleece, Ozella Almond, PA-C  doxycycline (VIBRAMYCIN) 100 MG capsule Take 1 capsule (100 mg total) by mouth 2 (two) times daily. 08/11/17   Caccavale, Sophia, PA-C  Erythromycin 2 % ointment Apply to affected area 2 times daily 06/24/17   Tasia Catchings, Amy V, PA-C  insulin glargine (LANTUS) 100 UNIT/ML injection Inject 20 Units into the skin at bedtime.     [provider]  losartan (COZAAR) 25 MG tablet Take 25 mg by mouth daily.    [provider]  metFORMIN (GLUCOPHAGE) 1000 MG tablet Take 1,000 mg by mouth daily with breakfast.    [provider]  methocarbamol (ROBAXIN) 500 MG tablet Take 1 tablet (500 mg total) by mouth 2 (two) times daily. 03/31/17   Ashley Murrain, NP  PARoxetine (PAXIL) 20 MG tablet Take 20 mg by mouth daily.    [provider]  simvastatin (ZOCOR) 40 MG tablet Take 40 mg by mouth every evening. Reported on  03/01/2016    [provider]    Family History No family history on file.  Social History Social History  Substance Use Topics  . Smoking status: Current Every Day Smoker    Packs/day: 1.00    Types: Cigarettes  . Smokeless tobacco: Never Used  . Alcohol use No     Allergies   Flagyl [metronidazole]   Review of Systems Review of Systems  Constitutional: Negative for chills and fever.  HENT: Positive for congestion and sinus pressure. Negative for sore throat and trouble swallowing.   Respiratory: Positive for cough. Negative for shortness of breath.   Cardiovascular: Negative for chest pain.  Musculoskeletal: Positive for myalgias  and neck pain.     Physical Exam Updated Vital Signs BP 101/85 (BP Location: Right Arm)   Pulse 87   Temp 98.2 F (36.8 C) (Oral)   Resp 17   Ht 5\' 7"  (1.702 m)   Wt 108.9 kg (240 lb)   SpO2 97%   BMI 37.59 kg/m   Physical Exam  Constitutional: She appears well-developed and well-nourished. No distress.  HENT:  Head: Normocephalic and atraumatic.  Tenderness to palpation of bilateral maxillary sinuses, left > right. OP with erythema, no exudates or tonsillar hypertrophy. TM's normal.   Neck: Neck supple.  No midline tenderness. No meningeal signs. Tenderness to palpation along the left paraspinal musculature.  Cardiovascular: Normal rate, regular rhythm and normal heart sounds.   No murmur heard. Pulmonary/Chest: Effort normal and breath sounds normal. No respiratory distress. She has no wheezes. She has no rales.  Musculoskeletal: Normal range of motion.  Neurological: She is alert.  Skin: Skin is warm and dry.  Nursing note and vitals reviewed.    ED Treatments / Results  Labs (all labs ordered are listed, but only abnormal results are displayed) Labs Reviewed - No data to display  EKG  EKG Interpretation None       Radiology No results found.  Procedures Procedures (including critical care time)  Medications Ordered in ED Medications - No data to display   Initial Impression / Assessment and Plan / ED Course  I have reviewed the triage vital signs and the nursing notes.  Pertinent labs & imaging results that were available during my care of the patient were reviewed by me and considered in my medical decision making (see chart for details).    Ellen Avery is a 51 y.o. female who presents to ED for two complaints:  1. Sinus pain. Afebrile, hemodynamically stable with tenderness to bilateral maxillary sinuses. Will treat with Augmentin. 2. Neck strain. Home care instructions discussed. Rx. For flexeril given.   Stressed PCP follow up if symptoms  persist. Return precautions discussed. All questions answered.    Final Clinical Impressions(s) / ED Diagnoses   Final diagnoses:  Acute upper respiratory infection  Strain of neck muscle, initial encounter    New Prescriptions Discharge Medication List as of 08/31/2017  7:45 PM    START taking these medications   Details  amoxicillin-clavulanate (AUGMENTIN) 875-125 MG tablet Take 1 tablet by mouth every 12 (twelve) hours., Starting Mon 08/31/2017, Print    cyclobenzaprine (FLEXERIL) 10 MG tablet Take 1 tablet (10 mg total) by mouth 2 (two) times daily as needed for muscle spasms., Starting Mon 08/31/2017, Print         Azizi Bally, Ozella Almond, PA-C 08/31/17 2051    Sherwood Gambler, MD 09/01/17 [phone]

## 2017-09-25 ENCOUNTER — Emergency Department (HOSPITAL_COMMUNITY)
Admission: EM | Admit: 2017-09-25 | Discharge: 2017-09-25 | Disposition: A | Discharge status: Home / Self Care | Payer: Medicaid Other | Attending: Emergency Medicine | Admitting: Emergency Medicine

## 2017-09-25 ENCOUNTER — Encounter (HOSPITAL_COMMUNITY): Payer: Self-pay | Admitting: Emergency Medicine

## 2017-09-25 DIAGNOSIS — M791 Myalgia, unspecified site: Secondary | ICD-10-CM | POA: Diagnosis not present

## 2017-09-25 DIAGNOSIS — Z5321 Procedure and treatment not carried out due to patient leaving prior to being seen by health care provider: Secondary | ICD-10-CM | POA: Insufficient documentation

## 2017-09-25 NOTE — ED Notes (Signed)
Patient called for room x2. No answer.

## 2017-09-25 NOTE — ED Triage Notes (Signed)
Patient also c/o bilat war fullness. Cough with yellow phlegm.

## 2017-09-25 NOTE — ED Triage Notes (Addendum)
Per PT[facility]-states cold symptoms since last Thursday-states OTC meds not working-states body aches-has appointment with PCP on Monday but feels like she cant wait till then-coughing, congestion and nausea

## 2017-09-25 NOTE — ED Notes (Signed)
Patient called for room x1. No answer.  

## 2017-10-17 ENCOUNTER — Emergency Department (HOSPITAL_COMMUNITY)
Admission: EM | Admit: 2017-10-17 | Discharge: 2017-10-17 | Disposition: A | Discharge status: Home / Self Care | Payer: Medicaid Other | Attending: Emergency Medicine | Admitting: Emergency Medicine

## 2017-10-17 ENCOUNTER — Encounter (HOSPITAL_COMMUNITY): Payer: Self-pay | Admitting: Emergency Medicine

## 2017-10-17 ENCOUNTER — Other Ambulatory Visit: Payer: Self-pay

## 2017-10-17 DIAGNOSIS — Z794 Long term (current) use of insulin: Secondary | ICD-10-CM | POA: Diagnosis not present

## 2017-10-17 DIAGNOSIS — F17210 Nicotine dependence, cigarettes, uncomplicated: Secondary | ICD-10-CM | POA: Diagnosis not present

## 2017-10-17 DIAGNOSIS — F419 Anxiety disorder, unspecified: Secondary | ICD-10-CM | POA: Insufficient documentation

## 2017-10-17 DIAGNOSIS — Z7982 Long term (current) use of aspirin: Secondary | ICD-10-CM | POA: Insufficient documentation

## 2017-10-17 DIAGNOSIS — Z79899 Other long term (current) drug therapy: Secondary | ICD-10-CM | POA: Insufficient documentation

## 2017-10-17 DIAGNOSIS — J329 Chronic sinusitis, unspecified: Secondary | ICD-10-CM

## 2017-10-17 DIAGNOSIS — E119 Type 2 diabetes mellitus without complications: Secondary | ICD-10-CM | POA: Diagnosis not present

## 2017-10-17 DIAGNOSIS — I10 Essential (primary) hypertension: Secondary | ICD-10-CM | POA: Insufficient documentation

## 2017-10-17 DIAGNOSIS — J0191 Acute recurrent sinusitis, unspecified: Secondary | ICD-10-CM | POA: Insufficient documentation

## 2017-10-17 DIAGNOSIS — F329 Major depressive disorder, single episode, unspecified: Secondary | ICD-10-CM | POA: Diagnosis not present

## 2017-10-17 DIAGNOSIS — R0981 Nasal congestion: Secondary | ICD-10-CM | POA: Diagnosis present

## 2017-10-17 MED ORDER — CLINDAMYCIN HCL 150 MG PO CAPS: 300.0000 mg | ORAL_CAPSULE | Freq: Three times a day (TID) | ORAL | 0 refills | Status: DC

## 2017-10-17 MED ORDER — DOXYCYCLINE HYCLATE 100 MG PO TABS: 100.0000 mg | ORAL_TABLET | Freq: Once | ORAL | Status: DC

## 2017-10-17 MED ORDER — CLINDAMYCIN HCL 150 MG PO CAPS
300.0000 mg | ORAL_CAPSULE | Freq: Once | ORAL | Status: AC
  Administered 2017-10-17: 300 mg via ORAL
  Filled 2017-10-17: qty 2

## 2017-10-17 MED ORDER — FLUTICASONE PROPIONATE 50 MCG/ACT NA SUSP: 2.0000 | Freq: Every day | NASAL | 0 refills | Status: DC

## 2017-10-17 NOTE — ED Triage Notes (Signed)
Pt c/o ear pain, facial pain, cough with yellow sputum-- has hx of seasonal allergies, was seen at [facility] a month ago-- received nasal spray for sinuses.

## 2017-10-17 NOTE — Discharge Instructions (Addendum)
You can use Flonase nasal spray which is avilable over the counter. Use nasal saline (you can try [employer] Simply Saline) at least 4 times a day, use saline 5-10 minutes before using the fluticasone (flonase) nasal spray  Do not use Afrin (Oxymetazoline)  Rest, wash hands frequently  and drink plenty of water.  You may try over the counter medication such as allegra-decongestant or claritin-decongestant and/or Mucinex or decongestants.  Push fluids to try to thin the mucus and get it to flow out the nose.   Please follow with your primary care doctor in the next 2 days for a check-up. They must obtain records for further management.   Do not hesitate to return to the Emergency Department for any new, worsening or concerning symptoms.

## 2017-10-17 NOTE — ED Provider Notes (Signed)
[facility] EMERGENCY DEPARTMENT Provider Note   CSN: [id number] Arrival date & time: 10/17/17  1020     History   Chief Complaint Chief Complaint  Patient presents with  . URI  . Facial Pain   HPI   Blood pressure 112/79, pulse 74, temperature 99.3 F (37.4 C), temperature source Oral, resp. rate 20, height 5\' 7"  (1.702 m), weight 104.3 kg (230 lb), SpO2 99 %.  Ellen Avery is a 51 y.o. female complaining of nasal congestion and sinus pain intermittently over the course of the last several months.  Patient states that she has been on multiple courses of antibiotic, chart review reveals doxycycline and Augmentin courses.  Patient with no significant change in her chronic cough (smoker with bronchitis), chest pain, shortness of breath, she reports otalgia drowsy.  She has seen an ENT about this several years ago but does not follow regularly, has not discussed with primary care.  This is a diabetic who takes both insulin oral anti-glycemics.  She denies any change in her vision, facial swelling or pain with eye movement.  Past Medical History:  Diagnosis Date  . Anxiety and depression   . Depression   . Diabetes mellitus without complication ([place])   . GERD (gastroesophageal reflux disease)   . Hypertension     Patient Active Problem List   Diagnosis Date Noted  . Altered mental status 03/02/2016  . MDD (major depressive disorder), recurrent episode, moderate ([place]) 03/02/2016  . Psychoses [facility])     Past Surgical History:  Procedure Laterality Date  . ABDOMINAL HYSTERECTOMY     total hysterectomy    OB History    No data available       Home Medications    Prior to Admission medications   Medication Sig Start Date End Date Taking? Authorizing Provider  amoxicillin-clavulanate (AUGMENTIN) 875-125 MG tablet Take 1 tablet by mouth every 12 (twelve) hours. 08/31/17   Ward, Ozella Almond, PA-C  aspirin EC 81 MG tablet Take 81 mg by mouth daily.     [provider]  clindamycin (CLEOCIN) 150 MG capsule Take 2 capsules (300 mg total) 3 (three) times daily by mouth. May dispense as 150mg  capsules 10/17/17   Nalu Troublefield, Elmyra Ricks, PA-C  cyclobenzaprine (FLEXERIL) 10 MG tablet Take 1 tablet (10 mg total) by mouth 2 (two) times daily as needed for muscle spasms. 08/31/17   Ward, Ozella Almond, PA-C  doxycycline (VIBRAMYCIN) 100 MG capsule Take 1 capsule (100 mg total) by mouth 2 (two) times daily. 08/11/17   Caccavale, Sophia, PA-C  Erythromycin 2 % ointment Apply to affected area 2 times daily 06/24/17   Tasia Catchings, Amy V, PA-C  fluticasone (FLONASE) 50 MCG/ACT nasal spray Place 2 sprays daily into both nostrils. 10/17/17   Jayin Derousse, Elmyra Ricks, PA-C  insulin glargine (LANTUS) 100 UNIT/ML injection Inject 20 Units into the skin at bedtime.     [provider]  losartan (COZAAR) 25 MG tablet Take 25 mg by mouth daily.    [provider]  metFORMIN (GLUCOPHAGE) 1000 MG tablet Take 1,000 mg by mouth daily with breakfast.    [provider]  methocarbamol (ROBAXIN) 500 MG tablet Take 1 tablet (500 mg total) by mouth 2 (two) times daily. 03/31/17   Ashley Murrain, NP  PARoxetine (PAXIL) 20 MG tablet Take 20 mg by mouth daily.    [provider]  simvastatin (ZOCOR) 40 MG tablet Take 40 mg by mouth every evening. Reported on 03/01/2016  [provider]    Family History No family history on file.  Social History Social History   Tobacco Use  . Smoking status: Current Every Day Smoker    Packs/day: 1.00    Types: Cigarettes  . Smokeless tobacco: Never Used  Substance Use Topics  . Alcohol use: No  . Drug use: No     Allergies   Flagyl [metronidazole]   Review of Systems Review of Systems  A complete review of systems was obtained and all systems are negative except as noted in the HPI and PMH.    Physical Exam Updated Vital Signs BP 112/79 (BP Location: Right Arm)   Pulse 74   Temp 99.3 F  (37.4 C) (Oral)   Resp 18   Ht 5\' 7"  (1.702 m)   Wt 104.3 kg (230 lb)   SpO2 99%   BMI 36.02 kg/m   Physical Exam  Constitutional: She is oriented to person, place, and time. She appears well-developed and well-nourished. No distress.  HENT:  Head: Normocephalic and atraumatic.  Mouth/Throat: Oropharynx is clear and moist.  No drooling or stridor. Posterior pharynx mildly erythematous no significant tonsillar hypertrophy. No exudate. Soft palate rises symmetrically. No TTP or induration under tongue.    Exquisitely tender to light palpation of the maxillary sinuses.  Patient has significant mucosal edema in the bilateral nares with no polyps appreciated.  Bilateral tympanic membranes with normal architecture and good light reflex.  Nonpurulent fluid behind the left TM.   Eyes: Conjunctivae and EOM are normal. Pupils are equal, round, and reactive to light.  Neck: Normal range of motion.  Cardiovascular: Normal rate, regular rhythm and intact distal pulses.  Pulmonary/Chest: Effort normal and breath sounds normal.  Abdominal: Soft. There is no tenderness.  Musculoskeletal: Normal range of motion.  Neurological: She is alert and oriented to person, place, and time.  Skin: She is not diaphoretic.  Psychiatric: She has a normal mood and affect.  Nursing note and vitals reviewed.    ED Treatments / Results  Labs (all labs ordered are listed, but only abnormal results are displayed) Labs Reviewed - No data to display  EKG  EKG Interpretation None       Radiology No results found.  Procedures Procedures (including critical care time)  Medications Ordered in ED Medications  clindamycin (CLEOCIN) capsule 300 mg (300 mg Oral Given 10/17/17 1132)     Initial Impression / Assessment and Plan / ED Course  I have reviewed the triage vital signs and the nursing notes.  Pertinent labs & imaging results that were available during my care of the patient were reviewed by me  and considered in my medical decision making (see chart for details).     Vitals:   10/17/17 1025 10/17/17 1030 10/17/17 1127  BP: 112/79    Pulse: 74    Resp: 20  18  Temp: 99.3 F (37.4 C)    TempSrc: Oral    SpO2: 99%    Weight:  104.3 kg (230 lb)   Height:  5\' 7"  (1.702 m)     Medications  clindamycin (CLEOCIN) capsule 300 mg (300 mg Oral Given 10/17/17 1132)    Ellen Avery is 51 y.o. female presenting with recurrent and aggressively treated sinusitis.  No signs of systemic infection, this patient is a diabetic requiring multiple anti-glycemic medications, she is requesting systemic steroids which I do not think are her in her best interest.  I think more important she should see an  ear nose and throat specialist.  Chart review shows that she has been on Augmentin and doxycycline, would consider Levaquin however since that has been shown to possibly create dangerous hypoglycemia I will start her on clindamycin instead, patient given prescription for Flonase, I have encouraged her to continue with her Zyrtec and let her primary care physician know that she has been seen multiple times for recurrent sinusitis in the ED.  I doubt this is a periorbital cellulitis or a venous thrombosis.  This seems to be most consistent with a simple recurrent sinusitis.  Evaluation does not show pathology that would require ongoing emergent intervention or inpatient treatment. Pt is hemodynamically stable and mentating appropriately. Discussed findings and plan with patient/guardian, who agrees with care plan. All questions answered. Return precautions discussed and outpatient follow up given.      Final Clinical Impressions(s) / ED Diagnoses   Final diagnoses:  Recurrent sinusitis    ED Discharge Orders        Ordered    clindamycin (CLEOCIN) 150 MG capsule  3 times daily     10/17/17 1115    fluticasone (FLONASE) 50 MCG/ACT nasal spray  Daily     10/17/17 1129       Norma Montemurro, Charna Elizabeth 10/17/17 1153    Blanchie Dessert, MD 10/18/17 1143

## 2017-12-03 ENCOUNTER — Encounter (HOSPITAL_COMMUNITY): Payer: Self-pay | Admitting: *Deleted

## 2017-12-03 ENCOUNTER — Other Ambulatory Visit: Payer: Self-pay

## 2017-12-03 ENCOUNTER — Emergency Department (HOSPITAL_COMMUNITY)
Admission: EM | Admit: 2017-12-03 | Discharge: 2017-12-03 | Disposition: A | Discharge status: Home / Self Care | Payer: Medicaid Other | Attending: Emergency Medicine | Admitting: Emergency Medicine

## 2017-12-03 DIAGNOSIS — I10 Essential (primary) hypertension: Secondary | ICD-10-CM | POA: Diagnosis not present

## 2017-12-03 DIAGNOSIS — E119 Type 2 diabetes mellitus without complications: Secondary | ICD-10-CM | POA: Insufficient documentation

## 2017-12-03 DIAGNOSIS — F17210 Nicotine dependence, cigarettes, uncomplicated: Secondary | ICD-10-CM | POA: Insufficient documentation

## 2017-12-03 DIAGNOSIS — Z794 Long term (current) use of insulin: Secondary | ICD-10-CM | POA: Diagnosis not present

## 2017-12-03 DIAGNOSIS — Z7982 Long term (current) use of aspirin: Secondary | ICD-10-CM | POA: Diagnosis not present

## 2017-12-03 DIAGNOSIS — J0101 Acute recurrent maxillary sinusitis: Secondary | ICD-10-CM | POA: Insufficient documentation

## 2017-12-03 DIAGNOSIS — R0981 Nasal congestion: Secondary | ICD-10-CM | POA: Diagnosis present

## 2017-12-03 DIAGNOSIS — Z79899 Other long term (current) drug therapy: Secondary | ICD-10-CM | POA: Diagnosis not present

## 2017-12-03 HISTORY — DX: Pure hypercholesterolemia, unspecified: E78.00

## 2017-12-03 MED ORDER — AMOXICILLIN-POT CLAVULANATE 875-125 MG PO TABS: 1.0000 | ORAL_TABLET | Freq: Two times a day (BID) | ORAL | 0 refills | Status: AC

## 2017-12-03 MED ORDER — OXYMETAZOLINE HCL 0.05 % NA SOLN: 1.0000 | Freq: Two times a day (BID) | NASAL | 0 refills | Status: AC

## 2017-12-03 NOTE — Discharge Instructions (Signed)
Take antibiotics as prescribed.  Take the entire course of antibiotics, even if your symptoms improve. Use Afrin twice a day for 3 days.  Do not use Afrin for more than 3 days, as this will cause worsening symptoms.  After this, resume using the nasal saline. It is important that she work on finding a humidifier, as this will likely improve your symptoms. Follow-up with your primary care doctor at your scheduled appointment for further evaluation of your sinus symptoms. Return to the emergency room if you develop persistent high fevers, worsening symptoms, or any new or concerning symptoms.

## 2017-12-03 NOTE — ED Triage Notes (Signed)
Pt states is taking antibiotics for sinus and bil ear pressure.  However, that is not working.  She continues to feel symptoms.

## 2017-12-03 NOTE — ED Provider Notes (Signed)
[facility] EMERGENCY DEPARTMENT Provider Note   CSN: [id number] Arrival date & time: 12/03/17  1823     History   Chief Complaint Chief Complaint  Patient presents with  . Recurrent Sinusitis    HPI Ellen Avery is a 51 y.o. female presenting for evaluation of nasal congestion, sinus pressure, and ear pain.  Patient states that for the past 4-5 months, she has had recurrent symptoms.  She has been treated multiple times with antibiotics, which improves her symptoms for short period of time, and then they return.  She has been evaluated by her primary care doctor, who wants to return her next month prior to going to ENT.  She was last on clindamycin 1 month ago.  She has not been on anything for the last month.  Symptoms have persisted, not gotten better or worse.  She reports worse symptoms on the left side.  She has been using a nasal saline spray and her daily allergy medicine.  She denies fevers, chills, eye pain, sore throat, chest pain, difficulty breathing.  She reports mild cough, worse in the mornings.  She denies pain with movement of her eyes, pain behind her eyes, or neck pain.  She is not immunocompromised.  HPI  Past Medical History:  Diagnosis Date  . Anxiety and depression   . Depression   . Diabetes mellitus without complication ([place])   . GERD (gastroesophageal reflux disease)   . Hypercholesteremia   . Hypertension     Patient Active Problem List   Diagnosis Date Noted  . Altered mental status 03/02/2016  . MDD (major depressive disorder), recurrent episode, moderate ([place]) 03/02/2016  . Psychoses [facility])     Past Surgical History:  Procedure Laterality Date  . ABDOMINAL HYSTERECTOMY     total hysterectomy    OB History    No data available       Home Medications    Prior to Admission medications   Medication Sig Start Date End Date Taking? Authorizing Provider  amoxicillin-clavulanate (AUGMENTIN) 875-125 MG tablet Take 1  tablet by mouth every 12 (twelve) hours for 14 days. 12/03/17 12/17/17  Jeevan Kalla, PA-C  aspirin EC 81 MG tablet Take 81 mg by mouth daily.    [provider]  clindamycin (CLEOCIN) 150 MG capsule Take 2 capsules (300 mg total) 3 (three) times daily by mouth. May dispense as 150mg  capsules 10/17/17   Pisciotta, Elmyra Ricks, PA-C  cyclobenzaprine (FLEXERIL) 10 MG tablet Take 1 tablet (10 mg total) by mouth 2 (two) times daily as needed for muscle spasms. 08/31/17   Ward, Ozella Almond, PA-C  doxycycline (VIBRAMYCIN) 100 MG capsule Take 1 capsule (100 mg total) by mouth 2 (two) times daily. 08/11/17   Ajaya Crutchfield, PA-C  Erythromycin 2 % ointment Apply to affected area 2 times daily 06/24/17   Tasia Catchings, Amy V, PA-C  fluticasone (FLONASE) 50 MCG/ACT nasal spray Place 2 sprays daily into both nostrils. 10/17/17   Pisciotta, Elmyra Ricks, PA-C  insulin glargine (LANTUS) 100 UNIT/ML injection Inject 20 Units into the skin at bedtime.     [provider]  losartan (COZAAR) 25 MG tablet Take 25 mg by mouth daily.    [provider]  metFORMIN (GLUCOPHAGE) 1000 MG tablet Take 1,000 mg by mouth daily with breakfast.    [provider]  methocarbamol (ROBAXIN) 500 MG tablet Take 1 tablet (500 mg total) by mouth 2 (two) times daily. 03/31/17   Ashley Murrain, NP  oxymetazoline Melissa Montane NASAL  SPRAY) 0.05 % nasal spray Place 1 spray into both nostrils 2 (two) times daily for 3 days. 12/03/17 12/06/17  Cassidy Tashiro, PA-C  PARoxetine (PAXIL) 20 MG tablet Take 20 mg by mouth daily.    [provider]  simvastatin (ZOCOR) 40 MG tablet Take 40 mg by mouth every evening. Reported on 03/01/2016    [provider]    Family History No family history on file.  Social History Social History   Tobacco Use  . Smoking status: Current Every Day Smoker    Packs/day: 1.00    Types: Cigarettes  . Smokeless tobacco: Never Used  Substance Use Topics  . Alcohol use: No  . Drug  use: No     Allergies   Flagyl [metronidazole]   Review of Systems Review of Systems  Constitutional: Negative for chills and fever.  HENT: Positive for congestion, ear pain, sinus pressure and sinus pain.   Eyes: Negative for photophobia, pain, discharge, redness, itching and visual disturbance.  Respiratory: Positive for cough. Negative for chest tightness and shortness of breath.   Cardiovascular: Negative for chest pain.  Gastrointestinal: Negative for abdominal pain, nausea and vomiting.     Physical Exam Updated Vital Signs BP (!) 103/55   Pulse (!) 58   Temp 98.3 F (36.8 C) (Oral)   Resp 18   Ht 5\' 7"  (1.702 m)   Wt 104.3 kg (230 lb)   SpO2 98%   BMI 36.02 kg/m   Physical Exam  Constitutional: She is oriented to person, place, and time. She appears well-developed and well-nourished. No distress.  HENT:  Head: Normocephalic and atraumatic.  Right Ear: Tympanic membrane, external ear and ear canal normal.  Left Ear: Tympanic membrane, external ear and ear canal normal.  Nose: Mucosal edema present. Right sinus exhibits maxillary sinus tenderness. Left sinus exhibits maxillary sinus tenderness.  Mouth/Throat: Uvula is midline, oropharynx is clear and moist and mucous membranes are normal. No tonsillar exudate.  Bilateral nasal mucosal edema, worse on the left side.  TMs not erythematous and not bulging.  Tenderness palpation of maxillary sinuses.  OP clear without tonsillar swelling or exudate.  Eyes: Conjunctivae and EOM are normal. Pupils are equal, round, and reactive to light.  Neck: Normal range of motion.  Cardiovascular: Normal rate, regular rhythm and intact distal pulses.  Pulmonary/Chest: Effort normal and breath sounds normal. No respiratory distress. She has no decreased breath sounds. She has no wheezes. She has no rhonchi. She has no rales.  Pt speaking in full sentences.  Clear lung sounds in all fields  Abdominal: Soft. She exhibits no distension.  There is no tenderness.  Musculoskeletal: Normal range of motion.  Lymphadenopathy:    She has no cervical adenopathy.  Neurological: She is alert and oriented to person, place, and time.  Skin: Skin is warm.  Psychiatric: She has a normal mood and affect.  Nursing note and vitals reviewed.    ED Treatments / Results  Labs (all labs ordered are listed, but only abnormal results are displayed) Labs Reviewed - No data to display  EKG  EKG Interpretation None       Radiology No results found.  Procedures Procedures (including critical care time)  Medications Ordered in ED Medications - No data to display   Initial Impression / Assessment and Plan / ED Course  I have reviewed the triage vital signs and the nursing notes.  Pertinent labs & imaging results that were available during my care of the patient  were reviewed by me and considered in my medical decision making (see chart for details).     Pt presenting with sinus congestion symptoms for the past several months.  Physical exam shows signs of sinusitis.  No evidence of orbital cellulitis. She has not been on antibiotics for the past month.  She reports most improvement with Augmentin.  Will treat with Augmentin for 2 weeks, Afrin for 3 days, and instructions to get a humidifier.  Patient to follow-up with her primary care doctor at her scheduled appointment.  At this time, patient appears safe for discharge.  Return precautions given.  Patient states she understands and agrees to plan.   Final Clinical Impressions(s) / ED Diagnoses   Final diagnoses:  Recurrent maxillary sinusitis    ED Discharge Orders        Ordered    amoxicillin-clavulanate (AUGMENTIN) 875-125 MG tablet  Every 12 hours     12/03/17 2049    oxymetazoline (AFRIN NASAL SPRAY) 0.05 % nasal spray  2 times daily     12/03/17 2049       Franchot Heidelberg, PA-C 12/04/17 0108    Isla Pence, MD 12/04/17 1510

## 2017-12-28 ENCOUNTER — Other Ambulatory Visit: Payer: Self-pay

## 2017-12-28 ENCOUNTER — Emergency Department (HOSPITAL_COMMUNITY)
Admission: EM | Admit: 2017-12-28 | Discharge: 2017-12-28 | Disposition: A | Discharge status: Home / Self Care | Payer: Medicaid Other | Attending: Emergency Medicine | Admitting: Emergency Medicine

## 2017-12-28 ENCOUNTER — Encounter (HOSPITAL_COMMUNITY): Payer: Self-pay | Admitting: Emergency Medicine

## 2017-12-28 DIAGNOSIS — E119 Type 2 diabetes mellitus without complications: Secondary | ICD-10-CM | POA: Diagnosis not present

## 2017-12-28 DIAGNOSIS — M542 Cervicalgia: Secondary | ICD-10-CM

## 2017-12-28 DIAGNOSIS — Z794 Long term (current) use of insulin: Secondary | ICD-10-CM | POA: Diagnosis not present

## 2017-12-28 DIAGNOSIS — I10 Essential (primary) hypertension: Secondary | ICD-10-CM | POA: Insufficient documentation

## 2017-12-28 DIAGNOSIS — Z79899 Other long term (current) drug therapy: Secondary | ICD-10-CM | POA: Diagnosis not present

## 2017-12-28 DIAGNOSIS — F17210 Nicotine dependence, cigarettes, uncomplicated: Secondary | ICD-10-CM | POA: Insufficient documentation

## 2017-12-28 DIAGNOSIS — Z7982 Long term (current) use of aspirin: Secondary | ICD-10-CM | POA: Diagnosis not present

## 2017-12-28 MED ORDER — CYCLOBENZAPRINE HCL 10 MG PO TABS: 10.0000 mg | ORAL_TABLET | Freq: Two times a day (BID) | ORAL | 0 refills | Status: DC | PRN

## 2017-12-28 MED ORDER — CYCLOBENZAPRINE HCL 10 MG PO TABS
10.0000 mg | ORAL_TABLET | Freq: Once | ORAL | Status: AC
  Administered 2017-12-28: 10 mg via ORAL
  Filled 2017-12-28: qty 1

## 2017-12-28 NOTE — Discharge Instructions (Signed)
Please continue taking Tylenol for your pain.  I have written you a prescription for a muscle relaxer called Flexeril. This medicine can make you drowsy so please do not drive, work or drink alcohol while taking it.   Please also use heat to help with your neck pain.   Schedule appointment with your primary doctor for recheck if your neck pain is not improving in a week.  Emergency department if you have loss of sensation in your hands or arms, fever greater than 100.59F, chest pain or have any new or worsening symptoms.

## 2017-12-28 NOTE — ED Triage Notes (Addendum)
Pt to ED via [facility] with c/o neck spasms.  Pt st's she is out of her muscle relaxants Flexeril

## 2017-12-28 NOTE — ED Provider Notes (Signed)
[facility] EMERGENCY DEPARTMENT Provider Note   CSN: [id number] Arrival date & time: 12/28/17  1732     History   Chief Complaint Chief Complaint  Patient presents with  . Neck Pain    HPI Ellen Avery is a 52 y.o. female.  HPI   Ellen Avery is a 52 year old female with a history of diabetes mellitus, hypertension, depression, anxiety who presents to the emergency department for evaluation of chronic neck pain.  Patient states that she has a history of cervical spinal stenosis, diagnosed on an MRI a few years ago.  Reports that she occasionally has flareups of pain in her neck which radiates to the bilateral shoulders.  She states that due to the cold weather and her stress she has had worsening pain in the neck and shoulders which began this morning.  Reports that she typically takes Flexeril for her pain, although she is out of this medication.  Reports that her pain is 7/10 in severity and described as "tight."  She has tried taking Tylenol without significant relief.  Reports her pain is improved with hot showers and baths.  Worsened with lateral flexion of the neck.  She denies fevers, chills, numbness, weakness, chest pain, shortness of breath, headache.  Denies recent injury or fall.  Past Medical History:  Diagnosis Date  . Anxiety and depression   . Depression   . Diabetes mellitus without complication ([place])   . GERD (gastroesophageal reflux disease)   . Hypercholesteremia   . Hypertension     Patient Active Problem List   Diagnosis Date Noted  . Altered mental status 03/02/2016  . MDD (major depressive disorder), recurrent episode, moderate ([place]) 03/02/2016  . Psychoses [facility])     Past Surgical History:  Procedure Laterality Date  . ABDOMINAL HYSTERECTOMY     total hysterectomy    OB History    No data available       Home Medications    Prior to Admission medications   Medication Sig Start Date End Date Taking? Authorizing Provider    aspirin EC 81 MG tablet Take 81 mg by mouth daily.    [provider]  clindamycin (CLEOCIN) 150 MG capsule Take 2 capsules (300 mg total) 3 (three) times daily by mouth. May dispense as 150mg  capsules 10/17/17   Pisciotta, Elmyra Ricks, PA-C  cyclobenzaprine (FLEXERIL) 10 MG tablet Take 1 tablet (10 mg total) by mouth 2 (two) times daily as needed for muscle spasms. 08/31/17   Ward, Ozella Almond, PA-C  doxycycline (VIBRAMYCIN) 100 MG capsule Take 1 capsule (100 mg total) by mouth 2 (two) times daily. 08/11/17   Caccavale, Sophia, PA-C  Erythromycin 2 % ointment Apply to affected area 2 times daily 06/24/17   Tasia Catchings, Amy V, PA-C  fluticasone (FLONASE) 50 MCG/ACT nasal spray Place 2 sprays daily into both nostrils. 10/17/17   Pisciotta, Elmyra Ricks, PA-C  insulin glargine (LANTUS) 100 UNIT/ML injection Inject 20 Units into the skin at bedtime.     [provider]  losartan (COZAAR) 25 MG tablet Take 25 mg by mouth daily.    [provider]  metFORMIN (GLUCOPHAGE) 1000 MG tablet Take 1,000 mg by mouth daily with breakfast.    [provider]  methocarbamol (ROBAXIN) 500 MG tablet Take 1 tablet (500 mg total) by mouth 2 (two) times daily. 03/31/17   Ashley Murrain, NP  PARoxetine (PAXIL) 20 MG tablet Take 20 mg by mouth daily.    [provider]  simvastatin (ZOCOR)  40 MG tablet Take 40 mg by mouth every evening. Reported on 03/01/2016    [provider]    Family History No family history on file.  Social History Social History   Tobacco Use  . Smoking status: Current Every Day Smoker    Packs/day: 1.00    Types: Cigarettes  . Smokeless tobacco: Never Used  Substance Use Topics  . Alcohol use: No  . Drug use: No     Allergies   Flagyl [metronidazole]   Review of Systems Review of Systems  Constitutional: Negative for chills, diaphoresis, fatigue and fever.  Respiratory: Negative for cough and shortness of breath.   Cardiovascular: Negative for  chest pain.  Gastrointestinal: Negative for abdominal pain, nausea and vomiting.  Musculoskeletal: Positive for neck pain. Negative for back pain and neck stiffness.  Neurological: Negative for weakness, numbness and headaches.     Physical Exam Updated Vital Signs BP 121/88 (BP Location: Right Arm)   Pulse 80   Temp 99.1 F (37.3 C) (Oral)   Resp 18   Ht 5\' 8"  (1.727 m)   Wt 104.3 kg (230 lb)   SpO2 98%   BMI 34.97 kg/m   Physical Exam  Constitutional: She is oriented to person, place, and time. She appears well-developed and well-nourished. No distress.  HENT:  Head: Normocephalic and atraumatic.  Eyes: Right eye exhibits no discharge. Left eye exhibits no discharge.  Neck: Normal range of motion. Neck supple.  Full active ROM, although painful with right and left lateral neck flexion.  No midline cervical spine tenderness.  Tender to palpation over the bilateral paraspinal muscles of the cervical spine.  No overlying rash, erythema or ecchymosis.  Cardiovascular: Normal rate and regular rhythm. Exam reveals no friction rub.  No murmur heard. Pulmonary/Chest: Effort normal and breath sounds normal. No stridor. No respiratory distress. She has no wheezes. She has no rales. She exhibits no tenderness.  Abdominal: Soft. Bowel sounds are normal. There is no tenderness. There is no guarding.  Lymphadenopathy:    She has no cervical adenopathy.  Neurological: She is alert and oriented to person, place, and time. Coordination normal.  Distal sensation to light/sharp touch intact in bilateral upper extremities.  Bilateral biceps reflex 2+.  Strength 5/5 in bilateral upper extremities.  Skin: Skin is warm and dry. Capillary refill takes less than 2 seconds. She is not diaphoretic.  Psychiatric: She has a normal mood and affect. Her behavior is normal.  Nursing note and vitals reviewed.    ED Treatments / Results  Labs (all labs ordered are listed, but only abnormal results are  displayed) Labs Reviewed - No data to display  EKG  EKG Interpretation None       Radiology No results found.  Procedures Procedures (including critical care time)  Medications Ordered in ED Medications - No data to display   Initial Impression / Assessment and Plan / ED Course  I have reviewed the triage vital signs and the nursing notes.  Pertinent labs & imaging results that were available during my care of the patient were reviewed by me and considered in my medical decision making (see chart for details).    Patient with a history of chronic neck pain presents to the emergency department for evaluation of flare up of her neck pain. Reports this occurs every so often and that flexeril improves her pain. No recent injuries and no midline cervical spine tenderness. No neurological deficits on exam. Do not suspect cervical spine  injury. Will d/c with tylenol and muscle relaxer.  Discussed that prescribed medicine can make her drowsy and she should not drive, work or drink alcohol while taking it.  Have discussed return precautions and counseled her to follow up with PCP for ongoing pain concerns. Patient agrees and voices understanding to the above plan and has no concerns prior to discharge.    Final Clinical Impressions(s) / ED Diagnoses   Final diagnoses:  Neck pain    ED Discharge Orders        Ordered    cyclobenzaprine (FLEXERIL) 10 MG tablet  2 times daily PRN     12/28/17 2038       Glyn Ade, PA-C 12/29/17 1022    Quintella Reichert, MD 12/29/17 1046

## 2018-01-28 ENCOUNTER — Other Ambulatory Visit: Payer: Self-pay | Admitting: Specialist

## 2018-01-28 ENCOUNTER — Other Ambulatory Visit: Payer: Self-pay | Admitting: Obstetrics

## 2018-01-28 DIAGNOSIS — Z1231 Encounter for screening mammogram for malignant neoplasm of breast: Secondary | ICD-10-CM

## 2018-02-08 ENCOUNTER — Ambulatory Visit (HOSPITAL_COMMUNITY)
Admission: EM | Admit: 2018-02-08 | Discharge: 2018-02-08 | Disposition: A | Discharge status: Home / Self Care | Payer: Medicaid Other | Attending: Emergency Medicine | Admitting: Emergency Medicine

## 2018-02-08 ENCOUNTER — Encounter (HOSPITAL_COMMUNITY): Payer: Self-pay | Admitting: Emergency Medicine

## 2018-02-08 DIAGNOSIS — K0889 Other specified disorders of teeth and supporting structures: Secondary | ICD-10-CM | POA: Diagnosis not present

## 2018-02-08 DIAGNOSIS — J069 Acute upper respiratory infection, unspecified: Secondary | ICD-10-CM | POA: Diagnosis not present

## 2018-02-08 DIAGNOSIS — B9789 Other viral agents as the cause of diseases classified elsewhere: Secondary | ICD-10-CM

## 2018-02-08 MED ORDER — FLUTICASONE PROPIONATE 50 MCG/ACT NA SUSP: 2.0000 | Freq: Every day | NASAL | 0 refills | Status: DC

## 2018-02-08 MED ORDER — TRAMADOL HCL 50 MG PO TABS: 50.0000 mg | ORAL_TABLET | Freq: Four times a day (QID) | ORAL | 0 refills | Status: AC | PRN

## 2018-02-08 MED ORDER — AMOXICILLIN-POT CLAVULANATE 875-125 MG PO TABS: 1.0000 | ORAL_TABLET | Freq: Two times a day (BID) | ORAL | 0 refills | Status: AC

## 2018-02-08 MED ORDER — BENZONATATE 200 MG PO CAPS: 200.0000 mg | ORAL_CAPSULE | Freq: Three times a day (TID) | ORAL | 0 refills | Status: AC | PRN

## 2018-02-08 NOTE — Discharge Instructions (Signed)
Please begin Augmentin twice daily for the next 10 days.  This will help with any infection in the tooth causing pain and sinuses.  Please continue Flonase and Zyrtec.  You may use Tessalon for cough.  For pain please take 600mg -800mg  of Ibuprofen every 8 hours, take with 1000 mg of Tylenol Extra strength every 8 hours. These are safe to take together. Please take with food.   I have also provided 2 days worth of stronger pain medication- tramadol. This should only be used for severe pain. Do not drive or operate machinery while taking this medication.   Please return if you start to experience significant swelling of your face, experiencing fever.  Please return if symptoms not improving with treatment.

## 2018-02-08 NOTE — ED Provider Notes (Signed)
[place]    CSN: [id number] Arrival date & time: 02/08/18  1118     History   Chief Complaint Chief Complaint  Patient presents with  . URI  . Dental Pain    HPI Ellen Avery is a 52 y.o. female history of allergic rhinitis, hypertension, DM, Patient is presenting with URI symptoms- congestion, cough, denies sore throat.  Patient also having dental pain.  Cough and congestion have been going on for approximately 4-5 days.  Also endorsing ear pain.  She is using Zyrtec and Flonase.  Dental pain has been going on for approximately 1 week, has set up follow-up with a dentist.  Taking Advil for pain.  Denies fever, nausea, vomiting, diarrhea. Denies shortness of breath and chest pain.    HPI  Past Medical History:  Diagnosis Date  . Anxiety and depression   . Depression   . Diabetes mellitus without complication ([place])   . GERD (gastroesophageal reflux disease)   . Hypercholesteremia   . Hypertension     Patient Active Problem List   Diagnosis Date Noted  . Altered mental status 03/02/2016  . MDD (major depressive disorder), recurrent episode, moderate ([place]) 03/02/2016  . Psychoses [facility])     Past Surgical History:  Procedure Laterality Date  . ABDOMINAL HYSTERECTOMY     total hysterectomy    OB History    No data available       Home Medications    Prior to Admission medications   Medication Sig Start Date End Date Taking? Authorizing Provider  amoxicillin-clavulanate (AUGMENTIN) 875-125 MG tablet Take 1 tablet by mouth every 12 (twelve) hours for 10 days. 02/08/18 02/18/18  Radhika Dershem C, PA-C  aspirin EC 81 MG tablet Take 81 mg by mouth daily.    [provider]  benzonatate (TESSALON) 200 MG capsule Take 1 capsule (200 mg total) by mouth 3 (three) times daily as needed for up to 7 days for cough. 02/08/18 02/15/18  Ren Aspinall C, PA-C  clindamycin (CLEOCIN) 150 MG capsule Take 2 capsules (300 mg total) 3 (three) times daily by mouth.  May dispense as 150mg  capsules 10/17/17   Pisciotta, Elmyra Ricks, PA-C  cyclobenzaprine (FLEXERIL) 10 MG tablet Take 1 tablet (10 mg total) by mouth 2 (two) times daily as needed for muscle spasms. 08/31/17   Ward, Ozella Almond, PA-C  cyclobenzaprine (FLEXERIL) 10 MG tablet Take 1 tablet (10 mg total) by mouth 2 (two) times daily as needed for muscle spasms. 12/28/17   Glyn Ade, PA-C  doxycycline (VIBRAMYCIN) 100 MG capsule Take 1 capsule (100 mg total) by mouth 2 (two) times daily. 08/11/17   Caccavale, Sophia, PA-C  Erythromycin 2 % ointment Apply to affected area 2 times daily 06/24/17   Tasia Catchings, Amy V, PA-C  fluticasone (FLONASE) 50 MCG/ACT nasal spray Place 2 sprays into both nostrils daily. 02/08/18   Zerline Melchior C, PA-C  insulin glargine (LANTUS) 100 UNIT/ML injection Inject 20 Units into the skin at bedtime.     [provider]  losartan (COZAAR) 25 MG tablet Take 25 mg by mouth daily.    [provider]  metFORMIN (GLUCOPHAGE) 1000 MG tablet Take 1,000 mg by mouth daily with breakfast.    [provider]  methocarbamol (ROBAXIN) 500 MG tablet Take 1 tablet (500 mg total) by mouth 2 (two) times daily. 03/31/17   Ashley Murrain, NP  PARoxetine (PAXIL) 20 MG tablet Take 20 mg by mouth daily.    [provider]  simvastatin (ZOCOR) 40 MG tablet Take 40 mg by mouth every evening. Reported on 03/01/2016    [provider]  traMADol (ULTRAM) 50 MG tablet Take 1 tablet (50 mg total) by mouth every 6 (six) hours as needed for up to 2 days. 02/08/18 02/10/18  Basel Defalco, Elesa Hacker, PA-C    Family History History reviewed. No pertinent family history.  Social History Social History   Tobacco Use  . Smoking status: Current Every Day Smoker    Packs/day: 1.00    Types: Cigarettes  . Smokeless tobacco: Never Used  Substance Use Topics  . Alcohol use: No  . Drug use: No     Allergies   Flagyl [metronidazole]   Review of Systems Review of Systems    Constitutional: Negative for chills, fatigue and fever.  HENT: Positive for congestion, dental problem, ear pain, rhinorrhea and sinus pressure. Negative for sore throat and trouble swallowing.   Respiratory: Positive for cough. Negative for chest tightness and shortness of breath.   Cardiovascular: Negative for chest pain.  Gastrointestinal: Negative for abdominal pain, nausea and vomiting.  Musculoskeletal: Negative for myalgias.  Skin: Negative for rash.  Neurological: Negative for dizziness, light-headedness and headaches.     Physical Exam Triage Vital Signs ED Triage Vitals [02/08/18 1246]  Enc Vitals Group     BP 106/78     Pulse Rate 75     Resp 18     Temp 98.4 F (36.9 C)     Temp Source Oral     SpO2 100 %     Weight      Height      Head Circumference      Peak Flow      Pain Score      Pain Loc      Pain Edu?      Excl. in [place]?    No data found.  Updated Vital Signs BP 106/78 (BP Location: Right Arm)   Pulse 75   Temp 98.4 F (36.9 C) (Oral)   Resp 18   SpO2 100%   Visual Acuity Right Eye Distance:   Left Eye Distance:   Bilateral Distance:    Right Eye Near:   Left Eye Near:    Bilateral Near:     Physical Exam  Constitutional: She appears well-developed and well-nourished. No distress.  HENT:  Head: Normocephalic and atraumatic.  Bilateral TMs nonerythematous, nasal mucosa erythematous with rhinorrhea present.  Posterior oropharynx clear, no tonsillar enlargement or exudate.  No facial asymmetry/swelling.  Patient has poor dentition overall, missing posterior lower molars bilaterally.  Patient having pain near #19/20 on left lower jaw, mild gingival swelling, tenderness to palpation of surrounding gingiva.  Nontender to palpation of floor of mouth below the tongue.  Eyes: Conjunctivae are normal.  Neck: Neck supple.  Cardiovascular: Normal rate and regular rhythm.  No murmur heard. Pulmonary/Chest: Effort normal and breath sounds normal. No  respiratory distress.  Abdominal: Soft. There is no tenderness.  Musculoskeletal: She exhibits no edema.  Neurological: She is alert.  Skin: Skin is warm and dry.  Psychiatric: She has a normal mood and affect.  Nursing note and vitals reviewed.    UC Treatments / Results  Labs (all labs ordered are listed, but only abnormal results are displayed) Labs Reviewed - No data to display  EKG  EKG Interpretation None       Radiology No results found.  Procedures Procedures (including critical care time)  Medications Ordered in UC  Medications - No data to display   Initial Impression / Assessment and Plan / UC Course  I have reviewed the triage vital signs and the nursing notes.  Pertinent labs & imaging results that were available during my care of the patient were reviewed by me and considered in my medical decision making (see chart for details).     Patient with URI symptoms likely from viral URI versus allergic rhinitis.  Given concomittent  dental pain will treat with Augmentin. Flonase refilled.  Tramadol for severe pain.  Tylenol ibuprofen for mild to moderate pain.  Follow-up with dentistry for further dental pain. Discussed strict return precautions. Patient verbalized understanding and is agreeable with plan.   Final Clinical Impressions(s) / UC Diagnoses   Final diagnoses:  Pain, dental  Viral URI with cough    ED Discharge Orders        Ordered    amoxicillin-clavulanate (AUGMENTIN) 875-125 MG tablet  Every 12 hours     02/08/18 1322    fluticasone (FLONASE) 50 MCG/ACT nasal spray  Daily     02/08/18 1322    benzonatate (TESSALON) 200 MG capsule  3 times daily PRN     02/08/18 1322    traMADol (ULTRAM) 50 MG tablet  Every 6 hours PRN     02/08/18 1322       Controlled Substance Prescriptions [place] Controlled Substance Registry consulted? Yes, I have consulted the [place] Controlled Substances Registry for this patient, and feel the risk/benefit ratio  today is favorable for proceeding with this prescription for a controlled substance.  No prescriptions in 2018 or 2019.   Neve Branscomb, [place] C, PA-C 02/08/18 1330

## 2018-02-08 NOTE — ED Triage Notes (Signed)
Pt here for URI sx and tooth pain

## 2018-02-17 ENCOUNTER — Ambulatory Visit: Payer: Self-pay

## 2018-03-08 ENCOUNTER — Emergency Department (HOSPITAL_COMMUNITY)
Admission: EM | Admit: 2018-03-08 | Discharge: 2018-03-09 | Disposition: A | Discharge status: Home / Self Care | Payer: Medicaid Other | Attending: Emergency Medicine | Admitting: Emergency Medicine

## 2018-03-08 ENCOUNTER — Emergency Department (HOSPITAL_COMMUNITY): Payer: Medicaid Other

## 2018-03-08 ENCOUNTER — Encounter (HOSPITAL_COMMUNITY): Payer: Self-pay | Admitting: Emergency Medicine

## 2018-03-08 DIAGNOSIS — M791 Myalgia, unspecified site: Secondary | ICD-10-CM | POA: Diagnosis not present

## 2018-03-08 DIAGNOSIS — I10 Essential (primary) hypertension: Secondary | ICD-10-CM | POA: Diagnosis not present

## 2018-03-08 DIAGNOSIS — M25551 Pain in right hip: Secondary | ICD-10-CM | POA: Diagnosis not present

## 2018-03-08 DIAGNOSIS — Z794 Long term (current) use of insulin: Secondary | ICD-10-CM | POA: Insufficient documentation

## 2018-03-08 DIAGNOSIS — M542 Cervicalgia: Secondary | ICD-10-CM | POA: Diagnosis present

## 2018-03-08 DIAGNOSIS — Z79899 Other long term (current) drug therapy: Secondary | ICD-10-CM | POA: Diagnosis not present

## 2018-03-08 DIAGNOSIS — F17210 Nicotine dependence, cigarettes, uncomplicated: Secondary | ICD-10-CM | POA: Insufficient documentation

## 2018-03-08 DIAGNOSIS — E119 Type 2 diabetes mellitus without complications: Secondary | ICD-10-CM | POA: Diagnosis not present

## 2018-03-08 DIAGNOSIS — Z7982 Long term (current) use of aspirin: Secondary | ICD-10-CM | POA: Diagnosis not present

## 2018-03-08 HISTORY — DX: Fibromyalgia: M79.7
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
## 2018-03-08 NOTE — ED Notes (Signed)
Patient transported to X-ray 
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
## 2018-03-09 MED ORDER — NAPROXEN 250 MG PO TABS
500.0000 mg | ORAL_TABLET | Freq: Once | ORAL | Status: AC
  Administered 2018-03-09: 500 mg via ORAL
  Filled 2018-03-09: qty 2

## 2018-03-09 MED ORDER — NAPROXEN 500 MG PO TABS: 500.0000 mg | ORAL_TABLET | Freq: Two times a day (BID) | ORAL | 0 refills | Status: DC

## 2018-03-09 MED ORDER — METHOCARBAMOL 500 MG PO TABS: 500.0000 mg | ORAL_TABLET | Freq: Three times a day (TID) | ORAL | 0 refills | Status: DC | PRN
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
## 2018-03-10 ENCOUNTER — Ambulatory Visit: Payer: Self-pay

## 2018-04-12 ENCOUNTER — Encounter: Payer: Self-pay | Admitting: Nurse Practitioner

## 2018-05-05 ENCOUNTER — Emergency Department
Admission: RE | Admit: 2018-05-05 | Discharge: 2018-05-05 | Disposition: A | Discharge status: Home / Self Care | Payer: Medicaid Other | Source: Ambulatory Visit | Attending: Specialist | Admitting: Specialist

## 2018-05-05 ENCOUNTER — Emergency Department (HOSPITAL_COMMUNITY)
Admission: EM | Admit: 2018-05-05 | Discharge: 2018-05-05 | Discharge status: Against Medical Advice | Payer: Medicaid Other | Attending: Emergency Medicine | Admitting: Emergency Medicine

## 2018-05-05 ENCOUNTER — Encounter (HOSPITAL_COMMUNITY): Payer: Self-pay | Admitting: Emergency Medicine

## 2018-05-05 ENCOUNTER — Other Ambulatory Visit: Payer: Self-pay

## 2018-05-05 DIAGNOSIS — Z5321 Procedure and treatment not carried out due to patient leaving prior to being seen by health care provider: Secondary | ICD-10-CM | POA: Insufficient documentation

## 2018-05-05 DIAGNOSIS — R51 Headache: Secondary | ICD-10-CM | POA: Insufficient documentation

## 2018-05-05 DIAGNOSIS — Z1231 Encounter for screening mammogram for malignant neoplasm of breast: Secondary | ICD-10-CM

## 2018-05-05 NOTE — ED Triage Notes (Signed)
Per pt has had a "sinus pressure" headache x 3 days. Also cut her right heel on a metal door frame and knows she needs a tetanus shot.

## 2018-05-05 NOTE — ED Notes (Signed)
Pt stated she is leaving due to extended wait time.

## 2018-05-06 ENCOUNTER — Encounter (INDEPENDENT_AMBULATORY_CARE_PROVIDER_SITE_OTHER): Payer: Self-pay

## 2018-05-06 ENCOUNTER — Ambulatory Visit (INDEPENDENT_AMBULATORY_CARE_PROVIDER_SITE_OTHER): Payer: Medicaid Other | Admitting: Nurse Practitioner

## 2018-05-06 ENCOUNTER — Encounter: Payer: Self-pay | Admitting: Nurse Practitioner

## 2018-05-06 VITALS — BP 110/64 | HR 74 | Ht 67.0 in | Wt 226.4 lb

## 2018-05-06 DIAGNOSIS — K219 Gastro-esophageal reflux disease without esophagitis: Secondary | ICD-10-CM | POA: Diagnosis not present

## 2018-05-06 DIAGNOSIS — K5909 Other constipation: Secondary | ICD-10-CM | POA: Diagnosis not present

## 2018-05-06 DIAGNOSIS — Z860100 Personal history of colon polyps, unspecified: Secondary | ICD-10-CM

## 2018-05-06 MED ORDER — ESOMEPRAZOLE MAGNESIUM 40 MG PO CPDR: DELAYED_RELEASE_CAPSULE | ORAL | 3 refills | Status: DC

## 2018-05-06 NOTE — Progress Notes (Signed)
Chief Complaint: GERD / hx of polyps  Referring Provider:  Lillia Corporal, MD      ASSESSMENT AND PLAN;   59.  52 year old female with chronic constipation -doesn't drink much water.  Encouraged 6-8 glasses / day.  -Doesn't feel she gets adequate fiber and just started Women's fiber pills.  -Okay to continue stool softeners.  -Okay to continue MOM as needed once weekly -May eventually need to escalate daily bowel regimen to Miralax or other agent  2. Hx of colon polyps, apparently pre-cancerous ones, in [place]. She is overdue for 5 year surveillance.  -Patient needs polyp surveillance colonoscopy. The risks and benefits of colonoscopy with possible polypectomy were discussed and the patient agrees to proceed.  -will request records from [place]  3. GERD , longstanding. Off meds for two months and now having frequent heartburn again.  -anti-reflux measures discussed -has been on multiple PPIs. Nexium worked the best so will restart that Q am.    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 She is referred by PCP for history of precancerous polyps .She gives a hx of precancerous colon polyps in [place] ~ 7 years ago but didn't go back for 5 year follow up. She occasionally sees scant blood in stool.  No family history colon cancer.  Patient has long-standing history of constipation.  She does not like water, recently started fiber pills.  At one point time she took a pill prescribed for constipation but cannot remember what it was.  She takes milk of magnesia maybe once a week.   Alyha has a longstanding hx of GERD with heartburn. Has taken various PPIs over the years. Each PPI would work for a while then loose efficacy. Heartburn was severe for years but reduced stress and GERD sx nearly resolved. Was able to come off meds for a few months, now getting heartburn again. Heartburn tends to get worse when more constipated. She takesTums and Pepcid AC as  needed, they do help.  She does not go to bed on an empty stomach. She is having heartburn at least three times a week and interested in getting back on daily treatment . No dysphagia.    Past Medical History:  Diagnosis Date  . Anxiety and depression   . Arthritis   . Depression   . Diabetes mellitus without complication ([place])   . Fibromyalgia   . GERD (gastroesophageal reflux disease)   . Hypercholesteremia   . Hypertension   . IBS (irritable bowel syndrome)   . Obesity     Past Surgical History:  Procedure Laterality Date  . ABDOMINAL HYSTERECTOMY     total hysterectomy   Family History  Problem Relation Age of Onset  . Diabetes Mother   . Heart disease Paternal Grandmother   . Diabetes Paternal Uncle    Social History   Tobacco Use  . Smoking status: Current Every Day Smoker    Packs/day: 1.00    Types: Cigarettes  . Smokeless tobacco: Never Used  Substance Use Topics  . Alcohol use: No  . Drug use: No   Current Outpatient Medications  Medication Sig Dispense Refill  . aspirin EC 81 MG tablet Take 81 mg by mouth daily.    . Cholecalciferol (VITAMIN D3) 3000 units TABS Take 3,000 Units by mouth daily.    . cyclobenzaprine (FLEXERIL) 10 MG tablet Take 1 tablet (10 mg total) by mouth 2 (two) times daily  as needed for muscle spasms. 20 tablet 0  . fluticasone (FLONASE) 50 MCG/ACT nasal spray Place 2 sprays into both nostrils daily. 16 g 0  . insulin glargine (LANTUS) 100 UNIT/ML injection Inject 20 Units into the skin at bedtime.     Marland Kitchen losartan (COZAAR) 25 MG tablet Take 25 mg by mouth daily.    . metFORMIN (GLUCOPHAGE) 1000 MG tablet Take 1,000 mg by mouth daily with breakfast.    . methocarbamol (ROBAXIN) 500 MG tablet Take 1 tablet (500 mg total) by mouth every 8 (eight) hours as needed. 30 tablet 0  . naproxen (NAPROSYN) 500 MG tablet Take 1 tablet (500 mg total) by mouth 2 (two) times daily. 30 tablet 0  . simvastatin (ZOCOR) 40 MG tablet Take 40 mg by mouth  every evening. Reported on 03/01/2016    . ipratropium (ATROVENT) 0.03 % nasal spray Use 2 sprays in each nostril 2 times daily  0   No current facility-administered medications for this visit.    Allergies  Allergen Reactions  . Flagyl [Metronidazole] Hives and Itching    Benadryl      Review of Systems: All systems reviewed and negative except where noted in HPI.    Physical Exam:    Wt Readings from Last 3 Encounters:  05/06/18 226 lb 6.4 oz (102.7 kg)  03/08/18 230 lb (104.3 kg)  12/28/17 230 lb (104.3 kg)    BP 110/64   Pulse 74   Ht 5\' 7"  (1.702 m)   Wt 226 lb 6.4 oz (102.7 kg)   BMI 35.46 kg/m  Constitutional:  Pleasant female in no acute distress. Psychiatric: Normal mood and affect. Behavior is normal. EENT: Pupils normal.  Conjunctivae are normal. No scleral icterus. Neck supple.  Cardiovascular: Normal rate, regular rhythm. No edema Pulmonary/chest: Effort normal and breath sounds normal. No wheezing, rales or rhonchi. Abdominal: Soft, nondistended, nontender. Bowel sounds active throughout. There are no masses palpable. No hepatomegaly. Neurological: Alert and oriented to person place and time. Skin: Skin is warm and dry. No rashes noted.  Tye Savoy, NP  05/06/2018, 9:52 AM  Cc:  Javier Docker, MD

## 2018-05-06 NOTE — Patient Instructions (Addendum)
If you are age 52 or older, your body mass index should be between 23-30. Your Body mass index is 35.46 kg/m. If this is out of the aforementioned range listed, please consider follow up with your Primary Care Provider.  If you are age 37 or younger, your body mass index should be between 19-25. Your Body mass index is 35.46 kg/m. If this is out of the aformentioned range listed, please consider follow up with your Primary Care Provider.   You have been scheduled for a colonoscopy. Please follow written instructions given to you at your visit today.  Please pick up your prep supplies at the pharmacy within the next 1-3 days. If you use inhalers (even only as needed), please bring them with you on the day of your procedure. Your physician has requested that you go to www.startemmi.com and enter the access code given to you at your visit today. This web site gives a general overview about your procedure. However, you should still follow specific instructions given to you by our office regarding your preparation for the procedure.  We have sent the following medications to your pharmacy for you to pick up at your convenience: Suprep Nexium  Increase water to 6-8 glasses daily.  You have been given GERD literature.    Thank you for choosing me and [facility].   Tye Savoy, NP  You may have a light breakfast the morning of prep day (the day before the procedure).   You may choose from one of the following items: eggs and toast OR chicken noodle soup and crackers.   You should have your breakfast completed between 8:00 and 9:00 am the day before your procedure.    After you have had your light breakfast you should start a clear liquid diet only, NO SOLIDS. No additional solid food is allowed. You may continue to have clear liquid up to 3 hours prior to your procedure.

## 2018-05-12 NOTE — Progress Notes (Signed)
Reviewed and agree with documentation and assessment and plan. K. Veena Kevyn Boquet , MD   

## 2018-05-14 ENCOUNTER — Emergency Department (HOSPITAL_COMMUNITY)
Admission: EM | Admit: 2018-05-14 | Discharge: 2018-05-14 | Disposition: A | Discharge status: Home / Self Care | Payer: Medicaid Other | Attending: Emergency Medicine | Admitting: Emergency Medicine

## 2018-05-14 ENCOUNTER — Emergency Department (HOSPITAL_COMMUNITY): Payer: Medicaid Other

## 2018-05-14 ENCOUNTER — Encounter (HOSPITAL_COMMUNITY): Payer: Self-pay | Admitting: *Deleted

## 2018-05-14 DIAGNOSIS — M25531 Pain in right wrist: Secondary | ICD-10-CM | POA: Insufficient documentation

## 2018-05-14 DIAGNOSIS — I10 Essential (primary) hypertension: Secondary | ICD-10-CM | POA: Insufficient documentation

## 2018-05-14 DIAGNOSIS — F17210 Nicotine dependence, cigarettes, uncomplicated: Secondary | ICD-10-CM | POA: Insufficient documentation

## 2018-05-14 DIAGNOSIS — Z79899 Other long term (current) drug therapy: Secondary | ICD-10-CM | POA: Insufficient documentation

## 2018-05-14 DIAGNOSIS — M549 Dorsalgia, unspecified: Secondary | ICD-10-CM | POA: Insufficient documentation

## 2018-05-14 DIAGNOSIS — W19XXXA Unspecified fall, initial encounter: Secondary | ICD-10-CM

## 2018-05-14 DIAGNOSIS — S3992XA Unspecified injury of lower back, initial encounter: Secondary | ICD-10-CM

## 2018-05-14 DIAGNOSIS — Z7982 Long term (current) use of aspirin: Secondary | ICD-10-CM | POA: Diagnosis not present

## 2018-05-14 DIAGNOSIS — E119 Type 2 diabetes mellitus without complications: Secondary | ICD-10-CM | POA: Insufficient documentation

## 2018-05-14 DIAGNOSIS — Z794 Long term (current) use of insulin: Secondary | ICD-10-CM | POA: Diagnosis not present

## 2018-05-14 LAB — CBG MONITORING, ED: GLUCOSE-CAPILLARY: 155 mg/dL — AB (ref 65–99)

## 2018-05-14 MED ORDER — ACETAMINOPHEN 325 MG PO TABS
650.0000 mg | ORAL_TABLET | Freq: Once | ORAL | Status: AC
  Administered 2018-05-14: 650 mg via ORAL
  Filled 2018-05-14: qty 2

## 2018-05-14 MED ORDER — OXYCODONE-ACETAMINOPHEN 5-325 MG PO TABS
1.0000 | ORAL_TABLET | ORAL | Status: DC | PRN
  Administered 2018-05-14: 1 via ORAL
  Filled 2018-05-14: qty 1

## 2018-05-14 NOTE — ED Triage Notes (Signed)
Pt in via EMS to triage, denies LOC, c/o pain to her lower back and tailbone, also right wrist, pt alert and oriented

## 2018-05-14 NOTE — ED Notes (Signed)
Pt in CT.

## 2018-05-14 NOTE — ED Notes (Signed)
Pt given orange juice.

## 2018-05-14 NOTE — ED Notes (Signed)
Pt called out requesting something to eat or drink, rn informed pt that we could not give her anything until the xray results are back and the EDP approves eating/drinking. Pt stated "I am diabetic and I haven't had anything all morning" this RN asked if she wanted her CBG checked and pt declined.

## 2018-05-14 NOTE — ED Provider Notes (Signed)
[facility] EMERGENCY DEPARTMENT Provider Note   CSN: [id number] Arrival date & time: 05/14/18  1102     History   Chief Complaint Chief Complaint  Patient presents with  . Fall    HPI Ellen Avery is a 52 y.o. female with a past medical history of diabetes, fibromyalgia, hypertension, anxiety and depression, who presents today for evaluation after a fall.  She reports that she had a mechanical slip and fall on wet steps at her home causing her to fall onto the steps.  She denies striking her head.  States she does not take blood thinning medications.  She is complaining of pain in her right wrist, her tailbone, her mid back, and in her neck.  She has chronic neck pain and feels like it is slightly worse.  She did not pass out or lose consciousness.  No intervention tried prior to arrival.  She reports her wrist is hurting her the most.  HPI  Past Medical History:  Diagnosis Date  . Anxiety and depression   . Arthritis   . Depression   . Diabetes mellitus without complication ([place])   . Fibromyalgia   . GERD (gastroesophageal reflux disease)   . Hypercholesteremia   . Hypertension   . IBS (irritable bowel syndrome)   . Obesity     Patient Active Problem List   Diagnosis Date Noted  . Altered mental status 03/02/2016  . MDD (major depressive disorder), recurrent episode, moderate ([place]) 03/02/2016  . [facility] [place])     Past Surgical History:  Procedure Laterality Date  . ABDOMINAL HYSTERECTOMY     total hysterectomy     OB History   None      Home Medications    Prior to Admission medications   Medication Sig Start Date End Date Taking? Authorizing Provider  aspirin EC 81 MG tablet Take 81 mg by mouth daily.    [provider]  Cholecalciferol (VITAMIN D3) 3000 units TABS Take 3,000 Units by mouth daily.    [provider]  cyclobenzaprine (FLEXERIL) 10 MG tablet Take 1 tablet (10 mg total) by mouth 2 (two) times daily as  needed for muscle spasms. 12/28/17   Glyn Ade, PA-C  esomeprazole (NEXIUM) 40 MG capsule Take one 30 minutes before breakfast 05/06/18   Willia Craze, NP  fluticasone [facility]) 50 MCG/ACT nasal spray Place 2 sprays into both nostrils daily. 02/08/18   Wieters, Hallie C, PA-C  insulin glargine (LANTUS) 100 UNIT/ML injection Inject 20 Units into the skin at bedtime.     [provider]  ipratropium (ATROVENT) 0.03 % nasal spray Use 2 sprays in each nostril 2 times daily 04/21/18   [provider]  losartan (COZAAR) 25 MG tablet Take 25 mg by mouth daily.    [provider]  metFORMIN (GLUCOPHAGE) 1000 MG tablet Take 1,000 mg by mouth daily with breakfast.    [provider]  methocarbamol (ROBAXIN) 500 MG tablet Take 1 tablet (500 mg total) by mouth every 8 (eight) hours as needed. 03/09/18   Petrucelli, Samantha R, PA-C  naproxen (NAPROSYN) 500 MG tablet Take 1 tablet (500 mg total) by mouth 2 (two) times daily. 03/09/18   Petrucelli, Samantha R, PA-C  simvastatin (ZOCOR) 40 MG tablet Take 40 mg by mouth every evening. Reported on 03/01/2016    [provider]    Family History Family History  Problem Relation Age of Onset  . Diabetes Mother   . Heart  disease Paternal Grandmother   . Diabetes Paternal Uncle     Social History Social History   Tobacco Use  . Smoking status: Current Every Day Smoker    Packs/day: 1.00    Types: Cigarettes  . Smokeless tobacco: Never Used  Substance Use Topics  . Alcohol use: No  . Drug use: No     Allergies   Flagyl [metronidazole]   Review of Systems Review of Systems  Constitutional: Negative for chills and fever.  HENT: Negative for congestion.   Eyes: Negative for visual disturbance.  Respiratory: Negative for shortness of breath.   Cardiovascular: Negative for chest pain.  Gastrointestinal: Negative for abdominal pain, nausea and vomiting.  Musculoskeletal: Positive for back pain and  neck pain.       Pain in right wrist  Skin: Negative for color change and wound.  Neurological: Negative for syncope, weakness and headaches.  All other systems reviewed and are negative.    Physical Exam Updated Vital Signs BP 129/86 (BP Location: Right Arm)   Pulse 66   Temp 98.7 F (37.1 C) (Oral)   Resp 16   SpO2 100%   Physical Exam  Constitutional: She appears well-developed and well-nourished. No distress.  HENT:  Head: Normocephalic and atraumatic.  Mouth/Throat: Oropharynx is clear and moist.  Eyes: Pupils are equal, round, and reactive to light. Conjunctivae and EOM are normal. Right eye exhibits no discharge. Left eye exhibits no discharge. No scleral icterus.  Neck: Normal range of motion. Neck supple.  Cardiovascular: Normal rate, regular rhythm, normal heart sounds and intact distal pulses.  No murmur heard. Pulmonary/Chest: Effort normal and breath sounds normal. No stridor. No respiratory distress.  Abdominal: Soft. Bowel sounds are normal. She exhibits no distension. There is no tenderness. There is no guarding.  Musculoskeletal: She exhibits no edema or deformity.  Right wrist is without obvious deformities or crepitus.  There is mild tenderness with palpation to the distal ulna.  No TTP over anatomic snuff box.  Good ROM.   C/T/L spine palpated without step-offs or deformities.  She has localized tenderness to palpation over lower L-spine, lower T-spine, and over sacrum/coccyx.  No paraspinal tenderness to palpation.  Neurological: She is alert. She has normal strength. She exhibits normal muscle tone. Gait normal.  Skin: Skin is warm and dry. She is not diaphoretic.  Psychiatric: Her mood appears anxious.  Tearful  Nursing note and vitals reviewed.    ED Treatments / Results  Labs (all labs ordered are listed, but only abnormal results are displayed) Labs Reviewed  CBG MONITORING, ED - Abnormal; Notable for the following components:      Result Value     Glucose-Capillary 155 (*)    All other components within normal limits    EKG None  Radiology Dg Lumbar Spine Complete  Result Date: 05/14/2018 CLINICAL DATA:  [place] today with lumbosacral pain. EXAM: LUMBAR SPINE - COMPLETE 4+ VIEW COMPARISON:  None. FINDINGS: Five lumbar type vertebral bodies show normal alignment. No disc space narrowing. No fracture. Sacroiliac joints and sacrum appear normal. IMPRESSION: Negative. Electronically Signed   By: Nelson Chimes M.D.   On: 05/14/2018 11:50   Dg Sacrum/coccyx  Result Date: 05/14/2018 CLINICAL DATA:  [place] today.  Sacrococcygeal pain. EXAM: SACRUM AND COCCYX - 2+ VIEW COMPARISON:  None. FINDINGS: No sacral fracture is seen. Slight curved configuration the coccygeal region. I cannot definitely establish in acute fracture, but a subtle fracture could be present. If important to diagnose, CT could  do that. IMPRESSION: No displaced sacral or coccygeal fracture. Somewhat curved appearance of the coccyx which could be the patient's baseline. Difficult to completely rule out occult injury. If it is important to know this with certainty, CT could establish that. Electronically Signed   By: Nelson Chimes M.D.   On: 05/14/2018 11:51   Dg Wrist Complete Right  Result Date: 05/14/2018 CLINICAL DATA:  Pain following fall EXAM: RIGHT WRIST - COMPLETE 3+ VIEW COMPARISON:  None. FINDINGS: Frontal, oblique, lateral, and ulnar deviation scaphoid images were obtained. There is no demonstrable fracture or dislocation. Joint spaces appear normal. No erosive change. A small calcification in the triangular fibrocartilage region is well corticated and may represent residua of old trauma. IMPRESSION: No acute fracture or dislocation. Question residua of old trauma in the triangular fibrocartilage region. No appreciable arthropathy. Electronically Signed   By: Lowella Grip III M.D.   On: 05/14/2018 11:51   Ct Cervical Spine Wo Contrast  Result Date: 05/14/2018 CLINICAL  DATA:  Neck pain since a fall down 3-4 stairs this morning. Initial encounter. EXAM: CT CERVICAL SPINE WITHOUT CONTRAST TECHNIQUE: Multidetector CT imaging of the cervical spine was performed without intravenous contrast. Multiplanar CT image reconstructions were also generated. COMPARISON:  None. FINDINGS: Alignment: Maintained. There is mild reversal of the normal cervical lordosis. Skull base and vertebrae: No acute fracture. No primary bone lesion or focal pathologic process. Soft tissues and spinal canal: No prevertebral fluid or swelling. No visible canal hematoma. Disc levels: Intervertebral disc space height is maintained. Minimal endplate spurring Z6-6 noted. Also seen is some degenerative change about the articulation C1 and the odontoid process. Upper chest: Lung apices clear. Other: None. IMPRESSION: No acute abnormality. Mild degenerative disease C5-6. Electronically Signed   By: Inge Rise M.D.   On: 05/14/2018 14:55    Procedures Procedures (including critical care time)  Medications Ordered in ED Medications  oxyCODONE-acetaminophen (PERCOCET/ROXICET) 5-325 MG per tablet 1 tablet (1 tablet Oral Given 05/14/18 1115)  acetaminophen (TYLENOL) tablet 650 mg (650 mg Oral Given 05/14/18 1425)     Initial Impression / Assessment and Plan / ED Course  I have reviewed the triage vital signs and the nursing notes.  Pertinent labs & imaging results that were available during my care of the patient were reviewed by me and considered in my medical decision making (see chart for details).  Clinical Course as of May 14 1612  Fri May 14, 2018  1420 Was informed that patient does not wish to have x-ray, wishes for CAT scan.  I went to talk to patient with the nurse present.  Patient is tearful, states that she just wants to go home.  She complained that she was here and in pain for multiple hours, stating that it has been 5 hours since she had any pain medicine.  She had a Percocet at about 3  hours ago.  I discussed with patient that if she gets the CAT scan and not x-rays and there is an area in her back where she has pain in the midline that we have not evaluated.  Patient remained upset, started crying, stating that she wished to go home.  I informed patient that she may leave at any point, however it would be [place].  Patient was given juice, remain tearful.  Patient is upset that all of the appropriate imaging was not ordered from triage, I informed her that that is not the role of triage and that she does  have to wait and share resources with other patients.  Patient was offered additional pain medicine after she complained that we are making her wait while in pain, patient then got upset stating it is not about the pain medicine, that she does not want to wait.   [EH]  8299 Patient refused Thoracic spine x-rays.    [EH]    Clinical Course User Index [EH] Lorin Glass, PA-C   Patient presents today for evaluation after a mechanical fall causing her to land on her back.  X-rays of sacrum, coccyx, right wrist, and lower back were obtained prior to my seeing patient, all of which were not clearly consistent with an acute fracture.  There was concern that patient may have a fracture of her coccyx due to the curved appearance, however not obviously fractured.  I discussed this with the patient and informed her that a fracture would not change her management which is conservative and symptom control related.  Patient's lumbar x-rays did not show any abnormalities.  Her right wrist did not have snuffbox tenderness, compartments were soft easily compressible with no mid/proximal ulnar/radial tenderness, remainder of arm has good range of motion without pain.  No obvious wounds.  Patient does have a localized midline C-spine tenderness to palpation and localized midline T-spine tenderness to palpation.  CT neck and x-ray T-spine were added on.  Patient was upset that all of  her pictures were not ordered from triage.  Patient refused her T-spine x-rays after making the informed decision to do this, and explanation that it was the medical advice to have this area evaluated.  C-spine CT did not show any acute abnormalities.  Return precautions and PCP follow-up were discussed with patient.  We discussed conservative care measures including using a doughnut cushion, and an Ace wrap for her wrist.  Patient stated her understanding.  She already takes Flexeril twice daily 10 mg, therefore additional muscle relaxers were not given.  Patient states her understanding, discharged home.   Final Clinical Impressions(s) / ED Diagnoses   Final diagnoses:  Fall, initial encounter  Right wrist pain  Injury of coccyx, initial encounter  Acute midline back pain, unspecified back location    ED Discharge Orders    None       Ollen Gross 05/14/18 1614    Orlie Dakin, MD 05/14/18 [phone]

## 2018-05-14 NOTE — ED Triage Notes (Signed)
Pt denies hitting head or neck, denies LOC, states she slid down the 3-4 steps on her bottom/lower back area

## 2018-05-14 NOTE — ED Notes (Signed)
Pt given ice pack for wrist and updated on wait time

## 2018-05-14 NOTE — ED Notes (Signed)
Pt coming to treatment room after xray

## 2018-05-14 NOTE — ED Notes (Signed)
ED Provider at bedside. 

## 2018-05-14 NOTE — ED Notes (Signed)
Pt called out requesting to have her CBG checked, CBG 155

## 2018-05-14 NOTE — ED Notes (Signed)
Pt refused additional xray

## 2018-05-14 NOTE — ED Notes (Signed)
Pt does not want to have additional scans done, pt ready to go home, EDP to be notified.

## 2018-05-14 NOTE — Discharge Instructions (Addendum)
You are going to be sore based on your fall today.  You may experience more soreness on the sides of your neck and in your sides of your lower back tomorrow.  If you have any additional concerns or things worsen please seek additional medical care and evaluation.  Today you refused x-ray evaluation of your mid back.  Please go to the drugstore and get a donut cushion to sit on.  This will help take pressure off your tailbone.    Please take Ibuprofen (Advil, motrin) and Tylenol (acetaminophen) to relieve your pain.  You may take up to 600 MG (3 pills) of normal strength ibuprofen every 8 hours as needed.  In between doses of ibuprofen you make take tylenol, up to 1,000 mg (two extra strength pills).  Do not take more than 3,000 mg tylenol in a 24 hour period.  Please check all medication labels as many medications such as pain and cold medications may contain tylenol.  Do not drink alcohol while taking these medications.  Do not take other NSAID'S while taking ibuprofen (such as aleve or naproxen).  Please take ibuprofen with food to decrease stomach upset.

## 2018-05-17 ENCOUNTER — Emergency Department (HOSPITAL_COMMUNITY)
Admission: EM | Admit: 2018-05-17 | Discharge: 2018-05-17 | Disposition: A | Discharge status: Home / Self Care | Payer: Medicaid Other | Attending: Emergency Medicine | Admitting: Emergency Medicine

## 2018-05-17 ENCOUNTER — Other Ambulatory Visit: Payer: Self-pay

## 2018-05-17 ENCOUNTER — Encounter (HOSPITAL_COMMUNITY): Payer: Self-pay | Admitting: Emergency Medicine

## 2018-05-17 DIAGNOSIS — E119 Type 2 diabetes mellitus without complications: Secondary | ICD-10-CM | POA: Insufficient documentation

## 2018-05-17 DIAGNOSIS — Z79899 Other long term (current) drug therapy: Secondary | ICD-10-CM | POA: Insufficient documentation

## 2018-05-17 DIAGNOSIS — F17210 Nicotine dependence, cigarettes, uncomplicated: Secondary | ICD-10-CM | POA: Insufficient documentation

## 2018-05-17 DIAGNOSIS — Z7982 Long term (current) use of aspirin: Secondary | ICD-10-CM | POA: Insufficient documentation

## 2018-05-17 DIAGNOSIS — M533 Sacrococcygeal disorders, not elsewhere classified: Secondary | ICD-10-CM | POA: Diagnosis not present

## 2018-05-17 DIAGNOSIS — I10 Essential (primary) hypertension: Secondary | ICD-10-CM | POA: Diagnosis not present

## 2018-05-17 DIAGNOSIS — Z794 Long term (current) use of insulin: Secondary | ICD-10-CM | POA: Insufficient documentation

## 2018-05-17 MED ORDER — TRAMADOL HCL 50 MG PO TABS: 50.0000 mg | ORAL_TABLET | Freq: Four times a day (QID) | ORAL | 0 refills | Status: DC | PRN

## 2018-05-17 NOTE — ED Triage Notes (Signed)
Pt seen here Friday for same, pt fell down 3-4 steps landing on her back. Pt reports inc pain. Has tried OTC meds and muscle relaxers with no relief.

## 2018-05-17 NOTE — ED Provider Notes (Signed)
[place] EMERGENCY DEPARTMENT Provider Note   CSN: [id number] Arrival date & time: 05/17/18  1744     History   Chief Complaint Chief Complaint  Patient presents with  . Fall    HPI Ellen Avery is a 52 y.o. female.  Patient with history of diabetes, depression presents the emergency department today with continued coccyx pain after a fall occurring 3 days ago.  Patient fell down several steps onto her back.  She was seen in the emergency department and had plain film x-rays of her coccyx and sacrum, wrist, CT of her neck.  She was discharged home.  She states that she has been taking ibuprofen, Tylenol, muscle relaxers, using a cushion without relief.  She is having difficulty sleeping due to the pain.  She states that she has been on pain medicine in the past but not since she had a dental procedure in March. Patient denies warning symptoms of back pain including: fecal incontinence, urinary retention or overflow incontinence, night sweats, waking from sleep with back pain, unexplained fevers or weight loss, h/o cancer.  She is ambulatory without weakness, numbness, or tingling in her legs.      Past Medical History:  Diagnosis Date  . Anxiety and depression   . Arthritis   . Depression   . Diabetes mellitus without complication ([place])   . Fibromyalgia   . GERD (gastroesophageal reflux disease)   . Hypercholesteremia   . Hypertension   . IBS (irritable bowel syndrome)   . Obesity     Patient Active Problem List   Diagnosis Date Noted  . Altered mental status 03/02/2016  . MDD (major depressive disorder), recurrent episode, moderate ([place]) 03/02/2016  . Psychoses [facility])     Past Surgical History:  Procedure Laterality Date  . ABDOMINAL HYSTERECTOMY     total hysterectomy     OB History   None      Home Medications    Prior to Admission medications   Medication Sig Start Date End Date Taking? Authorizing Provider  aspirin EC 81 MG  tablet Take 81 mg by mouth daily.    [provider]  Cholecalciferol (VITAMIN D3) 3000 units TABS Take 3,000 Units by mouth daily.    [provider]  cyclobenzaprine (FLEXERIL) 10 MG tablet Take 1 tablet (10 mg total) by mouth 2 (two) times daily as needed for muscle spasms. 12/28/17   Glyn Ade, PA-C  esomeprazole (NEXIUM) 40 MG capsule Take one 30 minutes before breakfast 05/06/18   Willia Craze, NP  fluticasone [facility]) 50 MCG/ACT nasal spray Place 2 sprays into both nostrils daily. 02/08/18   Wieters, Hallie C, PA-C  insulin glargine (LANTUS) 100 UNIT/ML injection Inject 20 Units into the skin at bedtime.     [provider]  ipratropium (ATROVENT) 0.03 % nasal spray Use 2 sprays in each nostril 2 times daily 04/21/18   [provider]  losartan (COZAAR) 25 MG tablet Take 25 mg by mouth daily.    [provider]  metFORMIN (GLUCOPHAGE) 1000 MG tablet Take 1,000 mg by mouth daily with breakfast.    [provider]  methocarbamol (ROBAXIN) 500 MG tablet Take 1 tablet (500 mg total) by mouth every 8 (eight) hours as needed. 03/09/18   Petrucelli, Samantha R, PA-C  naproxen (NAPROSYN) 500 MG tablet Take 1 tablet (500 mg total) by mouth 2 (two) times daily. 03/09/18   Petrucelli, Samantha R, PA-C  simvastatin (ZOCOR) 40 MG tablet  Take 40 mg by mouth every evening. Reported on 03/01/2016    [provider]  traMADol (ULTRAM) 50 MG tablet Take 1 tablet (50 mg total) by mouth every 6 (six) hours as needed. 05/17/18   Carlisle Cater, PA-C    Family History Family History  Problem Relation Age of Onset  . Diabetes Mother   . Heart disease Paternal Grandmother   . Diabetes Paternal Uncle     Social History Social History   Tobacco Use  . Smoking status: Current Every Day Smoker    Packs/day: 1.00    Types: Cigarettes  . Smokeless tobacco: Never Used  Substance Use Topics  . Alcohol use: No  . Drug use: No      Allergies   Flagyl [metronidazole]   Review of Systems Review of Systems  Constitutional: Negative for fever and unexpected weight change.  Gastrointestinal: Negative for constipation.       Negative for fecal incontinence.   Genitourinary: Negative for dysuria, flank pain, hematuria, pelvic pain, vaginal bleeding and vaginal discharge.       Negative for urinary incontinence or retention.  Musculoskeletal: Positive for back pain.  Neurological: Negative for weakness and numbness.       Denies saddle paresthesias.     Physical Exam Updated Vital Signs BP (!) 130/91 (BP Location: Right Arm)   Pulse 71   Temp 98.8 F (37.1 C) (Oral)   Resp 18   Ht 5\' 7"  (1.702 m)   Wt 102.1 kg (225 lb)   SpO2 100%   BMI 35.24 kg/m   Physical Exam  Constitutional: She appears well-developed and well-nourished.  HENT:  Head: Normocephalic and atraumatic.  Eyes: Conjunctivae are normal.  Neck: Normal range of motion. Neck supple.  Pulmonary/Chest: Effort normal.  Abdominal: Soft. There is no tenderness. There is no CVA tenderness.  Musculoskeletal: Normal range of motion.       Cervical back: She exhibits normal range of motion, no tenderness and no bony tenderness.       Thoracic back: She exhibits tenderness. She exhibits normal range of motion and no bony tenderness.       Lumbar back: She exhibits tenderness and bony tenderness. She exhibits normal range of motion.  No step-off noted with palpation of spine.   Neurological: She is alert. She has normal strength and normal reflexes. No sensory deficit.  5/5 strength in entire lower extremities bilaterally. No sensation deficit.   Skin: Skin is warm and dry. No rash noted.  Psychiatric: She has a normal mood and affect.  Nursing note and vitals reviewed.    ED Treatments / Results  Labs (all labs ordered are listed, but only abnormal results are displayed) Labs Reviewed - No data to display  EKG None  Radiology No  results found.  Procedures Procedures (including critical care time)  Medications Ordered in ED Medications - No data to display   Initial Impression / Assessment and Plan / ED Course  I have reviewed the triage vital signs and the nursing notes.  Pertinent labs & imaging results that were available during my care of the patient were reviewed by me and considered in my medical decision making (see chart for details).     Patient seen and examined.  Reviewed previous imaging and evaluation.  Agree with findings.  Vital signs reviewed and are as follows: BP (!) 130/91 (BP Location: Right Arm)   Pulse 71   Temp 98.8 F (37.1 C) (Oral)   Resp 18  Ht 5\' 7"  (1.702 m)   Wt 102.1 kg (225 lb)   SpO2 100%   BMI 35.24 kg/m   I reviewed [address] substance reporting database which confirms patient is not currently taking any narcotic pain medications.  Will prescribe tramadol 50mg  #12.  She is encouraged to follow-up with her doctor if she continues to have pain.  I encouraged her to continue over-the-counter medications and cushioning for the area.  Final Clinical Impressions(s) / ED Diagnoses   Final diagnoses:  Coccyx pain   Patient with continued coccygeal pain after a fall 3 days ago.  Patient is here with concerns of her pain control.  Encourage PCP follow-up.  No red flag signs and symptoms of lower back pain.  Do not feel that she requires advanced imaging at this time.  ED Discharge Orders        Ordered    traMADol (ULTRAM) 50 MG tablet  Every 6 hours PRN     05/17/18 2039       Carlisle Cater, Hershal Coria 05/17/18 2108    Julianne Rice, MD 05/20/18 1810

## 2018-05-17 NOTE — ED Notes (Signed)
Pt verbalized understanding discharge instructions and denies any further needs or questions at this time. VS stable, ambulatory and steady gait.   See EDP assessment / note.   

## 2018-05-17 NOTE — Discharge Instructions (Signed)
Please read and follow all provided instructions.  Your diagnoses today include:  1. Coccyx pain    Tests performed today include:  Vital signs - see below for your results today  Medications prescribed:   Tramadol - narcotic-like pain medication  DO NOT drive or perform any activities that require you to be awake and alert because this medicine can make you drowsy.   Take any prescribed medications only as directed.  Home care instructions:   Follow any educational materials contained in this packet  Do not lift, push, pull anything more than 10 pounds for the next week  Follow-up instructions: Please follow-up with your primary care provider in the next 1 week for further evaluation of your symptoms.   Return instructions:  SEEK IMMEDIATE MEDICAL ATTENTION IF YOU HAVE:  New numbness, tingling, weakness, or problem with the use of your arms or legs  Severe back pain not relieved with medications  Loss control of your bowels or bladder  Increasing pain in any areas of the body (such as chest or abdominal pain)  Shortness of breath, dizziness, or fainting.   Worsening nausea (feeling sick to your stomach), vomiting, fever, or sweats  Any other emergent concerns regarding your health   Additional Information:  Your vital signs today were: BP (!) 130/91 (BP Location: Right Arm)    Pulse 71    Temp 98.8 F (37.1 C) (Oral)    Resp 18    Ht 5\' 7"  (1.702 m)    Wt 102.1 kg (225 lb)    SpO2 100%    BMI 35.24 kg/m  If your blood pressure (BP) was elevated above 135/85 this visit, please have this repeated by your doctor within one month. --------------

## 2018-05-21 ENCOUNTER — Telehealth: Payer: Self-pay | Admitting: Gastroenterology

## 2018-05-21 MED ORDER — NA SULFATE-K SULFATE-MG SULF 17.5-3.13-1.6 GM/177ML PO SOLN: ORAL | 0 refills | Status: DC

## 2018-05-21 NOTE — Telephone Encounter (Signed)
Patient was seen by Nevin Bloodgood  Prep was not sent  Sent it in to [facility] electronically today for patient   Patients colon is 6-21

## 2018-05-27 ENCOUNTER — Telehealth: Payer: Self-pay | Admitting: Gastroenterology

## 2018-05-27 NOTE — Telephone Encounter (Signed)
Last sugar check at 8am was 376.  Advised to keep a close check on sugar.  We dont want her to be in crisis either way so report back to [place] any abnormal findings.  Gave pt 24 hr afterhours phone number. Levada Dy RN Admitting

## 2018-05-27 NOTE — Telephone Encounter (Signed)
Diabetic pt scheduled for a colon tomorrow at 4:00pm. She takes pills and insulin. She is concerned about her sugar levels dropping due to clear liquid diet. She needs some advise. Pls call her.

## 2018-05-28 ENCOUNTER — Encounter: Payer: Self-pay | Admitting: Gastroenterology

## 2018-05-28 ENCOUNTER — Ambulatory Visit (AMBULATORY_SURGERY_CENTER): Payer: Medicaid Other | Admitting: Gastroenterology

## 2018-05-28 ENCOUNTER — Other Ambulatory Visit: Payer: Self-pay

## 2018-05-28 VITALS — BP 123/77 | HR 69 | Temp 98.6°F | Resp 9 | Ht 67.0 in | Wt 226.0 lb

## 2018-05-28 DIAGNOSIS — D122 Benign neoplasm of ascending colon: Secondary | ICD-10-CM | POA: Diagnosis not present

## 2018-05-28 DIAGNOSIS — Z86010 Personal history of colonic polyps: Secondary | ICD-10-CM | POA: Diagnosis not present

## 2018-05-28 DIAGNOSIS — K626 Ulcer of anus and rectum: Secondary | ICD-10-CM | POA: Diagnosis not present

## 2018-05-28 MED ORDER — LINACLOTIDE 145 MCG PO CAPS: 145.0000 ug | ORAL_CAPSULE | Freq: Every day | ORAL | 11 refills | Status: DC

## 2018-05-28 MED ORDER — SODIUM CHLORIDE 0.9 % IV SOLN: 500.0000 mL | INTRAVENOUS | Status: DC

## 2018-05-28 NOTE — Op Note (Addendum)
[place] Patient Name: Ellen Avery Procedure Date: 05/28/2018 3:17 PM MRN: [id number] Endoscopist: Mauri Pole , MD Age: 52 Referring MD:  Date of Birth: 09-Dec-1965 Gender: Female Account #: [id number] Procedure:                Colonoscopy Indications:              Evaluation of unexplained GI bleeding Medicines:                Monitored Anesthesia Care Procedure:                Pre-Anesthesia Assessment:                           - Prior to the procedure, a History and Physical                            was performed, and patient medications and                            allergies were reviewed. The patient's tolerance of                            previous anesthesia was also reviewed. The risks                            and benefits of the procedure and the sedation                            options and risks were discussed with the patient.                            All questions were answered, and informed consent                            was obtained. Prior Anticoagulants: The patient has                            taken no previous anticoagulant or antiplatelet                            agents. ASA Grade Assessment: II - A patient with                            mild systemic disease. After reviewing the risks                            and benefits, the patient was deemed in                            satisfactory condition to undergo the procedure.                           After obtaining informed consent, the colonoscope  was passed under direct vision. Throughout the                            procedure, the patient's blood pressure, pulse, and                            oxygen saturations were monitored continuously. The                            Model PCF-H190DL [id number]) scope was introduced                            through the anus and advanced to the the cecum,                            identified by  appendiceal orifice and ileocecal                            valve. The colonoscopy was performed without                            difficulty. The patient tolerated the procedure                            well. The quality of the bowel preparation was                            adequate. The ileocecal valve, appendiceal orifice,                            and rectum were photographed. Scope In: 3:31:06 PM Scope Out: 3:49:37 PM Scope Withdrawal Time: 0 hours 13 minutes 51 seconds  Total Procedure Duration: 0 hours 18 minutes 31 seconds  Findings:                 The perianal and digital rectal examinations were                            normal.                           A 2 mm polyp was found in the ascending colon. The                            polyp was sessile. The polyp was removed with a                            cold biopsy forceps. Resection and retrieval were                            complete.                           Non-bleeding internal hemorrhoids were found during  retroflexion. The hemorrhoids were small.                           2 fifteen to twenty mm ulcers were found in the                            rectum. No bleeding was present. Biopsies were                            taken with a cold forceps for histology.                           A few small-mouthed diverticula were found in the                            sigmoid colon and descending colon. Complications:            No immediate complications. Estimated Blood Loss:     Estimated blood loss was minimal. Impression:               - One 2 mm polyp in the ascending colon, removed                            with a cold biopsy forceps. Resected and retrieved.                           - Non-bleeding internal hemorrhoids.                           - 2 large ulcers in the rectum, possible stercoral                            ulcers. Biopsied. Recommendation:           - Patient has a  contact number available for                            emergencies. The signs and symptoms of potential                            delayed complications were discussed with the                            patient. Return to normal activities tomorrow.                            Written discharge instructions were provided to the                            patient.                           - Resume previous diet.                           - Continue present medications.                           -  Await pathology results.                           - Repeat colonoscopy date to be determined after                            pending pathology results are reviewed for                            surveillance based on pathology results.                           - No aspirin, ibuprofen, naproxen, or other                            non-steroidal anti-inflammatory drugs.                           - Use Linzess (linaclotide) 145 mcg PO daily.                           - Return to GI office at the next available                            appointment in 2-4 weeks. Mauri Pole, MD 05/28/2018 4:00:41 PM This report has been signed electronically.

## 2018-05-28 NOTE — Progress Notes (Signed)
Called to room to assist during endoscopic procedure.  Patient ID and intended procedure confirmed with present staff. Received instructions for my participation in the procedure from the performing physician.  

## 2018-05-28 NOTE — Progress Notes (Signed)
To recovery, report to RN, VSS. 

## 2018-05-28 NOTE — Patient Instructions (Signed)
YOU HAD AN ENDOSCOPIC PROCEDURE TODAY AT THE [facility]:   Refer to the procedure report that was given to you for any specific questions about what was found during the examination.  If the procedure report does not answer your questions, please call your gastroenterologist to clarify.  If you requested that your care partner not be given the details of your procedure findings, then the procedure report has been included in a sealed envelope for you to review at your convenience later.  YOU SHOULD EXPECT: Some feelings of bloating in the abdomen. Passage of more gas than usual.  Walking can help get rid of the air that was put into your GI tract during the procedure and reduce the bloating. If you had a lower endoscopy (such as a colonoscopy or flexible sigmoidoscopy) you may notice spotting of blood in your stool or on the toilet paper. If you underwent a bowel prep for your procedure, you may not have a normal bowel movement for a few days.  Please Note:  You might notice some irritation and congestion in your nose or some drainage.  This is from the oxygen used during your procedure.  There is no need for concern and it should clear up in a day or so.  SYMPTOMS TO REPORT IMMEDIATELY:   Following lower endoscopy (colonoscopy or flexible sigmoidoscopy):  Excessive amounts of blood in the stool  Significant tenderness or worsening of abdominal pains  Swelling of the abdomen that is new, acute  Fever of 100F or higher    For urgent or emergent issues, a gastroenterologist can be reached at any hour by calling [phone].   DIET:  We do recommend a small meal at first, but then you may proceed to your regular diet.  Drink plenty of fluids but you should avoid alcoholic beverages for 24 hours.  ACTIVITY:  You should plan to take it easy for the rest of today and you should NOT DRIVE or use heavy machinery until tomorrow (because of the sedation medicines used during the test).     FOLLOW UP: Our staff will call the number listed on your records the next business day following your procedure to check on you and address any questions or concerns that you may have regarding the information given to you following your procedure. If we do not reach you, we will leave a message.  However, if you are feeling well and you are not experiencing any problems, there is no need to return our call.  We will assume that you have returned to your regular daily activities without incident.  If any biopsies were taken you will be contacted by phone or by letter within the next 1-3 weeks.  Please call us at [phone] if you have not heard about the biopsies in 3 weeks.    SIGNATURES/CONFIDENTIALITY: You and/or your care partner have signed paperwork which will be entered into your electronic medical record.  These signatures attest to the fact that that the information above on your After Visit Summary has been reviewed and is understood.  Full responsibility of the confidentiality of this discharge information lies with you and/or your care-partner  Polyp and hemorrhoid information given.. 

## 2018-05-31 ENCOUNTER — Telehealth: Payer: Self-pay

## 2018-05-31 NOTE — Telephone Encounter (Signed)
  Follow up Call-  Call back number 05/28/2018  Post procedure Call Back phone  # [phone]  Permission to leave phone message Yes     Patient questions:  Do you have a fever, pain , or abdominal swelling? No. Pain Score  1 *  Have you tolerated food without any problems? Yes.    Have you been able to return to your normal activities? Yes.    Do you have any questions about your discharge instructions: Diet   No. Medications  No. Follow up visit  No.  Do you have questions or concerns about your Care? No.  Actions: * If pain score is 4 or above: No action needed, pain <4.

## 2018-05-31 NOTE — Telephone Encounter (Signed)
Left message

## 2018-06-02 ENCOUNTER — Telehealth: Payer: Self-pay

## 2018-06-02 NOTE — Telephone Encounter (Signed)
Called to patient. She did not want to schedule her appointment right now. She states she will call back and schedule it. She is working right now.

## 2018-06-02 NOTE — Telephone Encounter (Signed)
-----   Message from Mauri Pole, MD sent at 05/28/2018  5:08 PM EDT ----- Large rectal ulcers. I sent Rx for Linzess.  Please schedule office follow up visit next available. If I have no appointments, ok to schedule with APP She doesn't need hemorrhoidal band ligation Thanks VN

## 2018-06-03 ENCOUNTER — Telehealth: Payer: Self-pay | Admitting: Nurse Practitioner

## 2018-06-03 NOTE — Telephone Encounter (Signed)
Pt needs clarification regarding ov that she needs. She is confused and under the impression that she needs another colonoscopy. Pls call her.

## 2018-06-03 NOTE — Telephone Encounter (Signed)
Discussed the purpose of her follow up with the provider. Not scheduling for a procedure today.

## 2018-06-07 ENCOUNTER — Emergency Department (HOSPITAL_COMMUNITY)
Admission: EM | Admit: 2018-06-07 | Discharge: 2018-06-07 | Disposition: A | Discharge status: Home / Self Care | Payer: Medicaid Other | Attending: Emergency Medicine | Admitting: Emergency Medicine

## 2018-06-07 ENCOUNTER — Other Ambulatory Visit: Payer: Self-pay

## 2018-06-07 DIAGNOSIS — F17210 Nicotine dependence, cigarettes, uncomplicated: Secondary | ICD-10-CM | POA: Diagnosis not present

## 2018-06-07 DIAGNOSIS — I10 Essential (primary) hypertension: Secondary | ICD-10-CM | POA: Diagnosis not present

## 2018-06-07 DIAGNOSIS — M545 Low back pain, unspecified: Secondary | ICD-10-CM

## 2018-06-07 DIAGNOSIS — E7800 Pure hypercholesterolemia, unspecified: Secondary | ICD-10-CM | POA: Diagnosis not present

## 2018-06-07 DIAGNOSIS — J0100 Acute maxillary sinusitis, unspecified: Secondary | ICD-10-CM

## 2018-06-07 DIAGNOSIS — E119 Type 2 diabetes mellitus without complications: Secondary | ICD-10-CM | POA: Diagnosis not present

## 2018-06-07 DIAGNOSIS — Z7982 Long term (current) use of aspirin: Secondary | ICD-10-CM | POA: Diagnosis not present

## 2018-06-07 DIAGNOSIS — R1084 Generalized abdominal pain: Secondary | ICD-10-CM | POA: Diagnosis present

## 2018-06-07 DIAGNOSIS — Z79899 Other long term (current) drug therapy: Secondary | ICD-10-CM | POA: Insufficient documentation

## 2018-06-07 DIAGNOSIS — Z794 Long term (current) use of insulin: Secondary | ICD-10-CM | POA: Diagnosis not present

## 2018-06-07 LAB — URINALYSIS, ROUTINE W REFLEX MICROSCOPIC
Bilirubin Urine: NEGATIVE
Glucose, UA: NEGATIVE mg/dL
Hgb urine dipstick: NEGATIVE
Ketones, ur: NEGATIVE mg/dL
Leukocytes, UA: NEGATIVE
Nitrite: NEGATIVE
Protein, ur: NEGATIVE mg/dL
Specific Gravity, Urine: 1.026 (ref 1.005–1.030)
pH: 5 (ref 5.0–8.0)

## 2018-06-07 MED ORDER — FLUTICASONE PROPIONATE 50 MCG/ACT NA SUSP: 1.0000 | Freq: Every day | NASAL | 2 refills | Status: DC

## 2018-06-07 NOTE — ED Provider Notes (Signed)
[facility] EMERGENCY DEPARTMENT Provider Note   CSN: [id number] Arrival date & time: 06/07/18  2009     History   Chief Complaint Chief Complaint  Patient presents with  . Flank Pain    Right side    HPI Ellen Avery is a 52 y.o. female present for 1 week of sinus pressure.  Patient states that she has had pressure around her maxillary sinuses for 1 week in addition to a mild sore throat and dry, nonproductive cough.  Patient states that she has tried using Sudafed and nasal saline without relief.  Patient denies shortness of breath, chest pain, fever, nausea/vomiting or diarrhea. Patient states that she has also had 4 days of right-sided lower back pain.  Patient denies trauma or injury to the area.  Patient describes the pain as a mild 1/10 aching pain.  Patient has not taken any medication for the symptoms states that it is a secondary complaint, she is mainly here for her sinus pressure. Patient denies urinary symptoms, abdominal pain or tenderness, weakness/numbness/tingling. HPI  Past Medical History:  Diagnosis Date  . Allergy   . Anxiety and depression   . Arthritis   . Depression   . Diabetes mellitus without complication ([place])   . Fibromyalgia   . GERD (gastroesophageal reflux disease)   . Hypercholesteremia   . Hypertension   . IBS (irritable bowel syndrome)   . Obesity     Patient Active Problem List   Diagnosis Date Noted  . Altered mental status 03/02/2016  . MDD (major depressive disorder), recurrent episode, moderate ([place]) 03/02/2016  . Psychoses [facility])     Past Surgical History:  Procedure Laterality Date  . ABDOMINAL HYSTERECTOMY     total hysterectomy     OB History   None      Home Medications    Prior to Admission medications   Medication Sig Start Date End Date Taking? Authorizing Provider  ACCU-CHEK AVIVA PLUS test strip USE TO CHECK BLOOD SUGAR 4 TIMES DAILY 05/25/18   [provider]  aspirin EC 81 MG  tablet Take 81 mg by mouth daily.    [provider]  Cholecalciferol (VITAMIN D3) 3000 units TABS Take 3,000 Units by mouth daily.    [provider]  cyclobenzaprine (FLEXERIL) 10 MG tablet Take 1 tablet (10 mg total) by mouth 2 (two) times daily as needed for muscle spasms. 12/28/17   Glyn Ade, PA-C  esomeprazole (NEXIUM) 40 MG capsule Take one 30 minutes before breakfast Patient not taking: Reported on 05/28/2018 05/06/18   Willia Craze, NP  fluticasone [facility]) 50 MCG/ACT nasal spray Place 1 spray into both nostrils daily. 06/07/18   Nuala Alpha A, PA-C  insulin glargine (LANTUS) 100 UNIT/ML injection Inject 20 Units into the skin at bedtime.     [provider]  ipratropium (ATROVENT) 0.03 % nasal spray Use 2 sprays in each nostril 2 times daily 04/21/18   [provider]  linaclotide (LINZESS) 145 MCG CAPS capsule Take 1 capsule (145 mcg total) by mouth daily before breakfast. 05/28/18   Nandigam, Venia Minks, MD  losartan (COZAAR) 25 MG tablet Take 25 mg by mouth daily.    [provider]  metFORMIN (GLUCOPHAGE) 1000 MG tablet Take 1,000 mg by mouth daily with breakfast.    [provider]  naproxen (NAPROSYN) 500 MG tablet Take 1 tablet (500 mg total) by mouth 2 (two) times daily. 03/09/18   Petrucelli, Glynda Jaeger, PA-C  simvastatin (ZOCOR) 40 MG tablet Take 40 mg by mouth every evening. Reported on 03/01/2016    [provider]    Family History Family History  Problem Relation Age of Onset  . Diabetes Mother   . Heart disease Paternal Grandmother   . Diabetes Paternal Uncle   . Colon cancer Neg Hx   . Colon polyps Neg Hx   . Esophageal cancer Neg Hx   . Rectal cancer Neg Hx   . Stomach cancer Neg Hx     Social History Social History   Tobacco Use  . Smoking status: Current Every Day Smoker    Packs/day: 1.00    Types: Cigarettes  . Smokeless tobacco: Never Used  Substance Use Topics  . Alcohol use:  No  . Drug use: No     Allergies   Flagyl [metronidazole]   Review of Systems Review of Systems  Constitutional: Negative.  Negative for chills, fatigue and fever.  HENT: Positive for congestion and sinus pressure. Negative for ear discharge, ear pain, facial swelling, rhinorrhea, sore throat and trouble swallowing.   Eyes: Negative.  Negative for discharge, redness and visual disturbance.  Respiratory: Negative.  Negative for cough, chest tightness and shortness of breath.   Cardiovascular: Negative.  Negative for chest pain and leg swelling.  Gastrointestinal: Negative.  Negative for abdominal pain, blood in stool, diarrhea, nausea and vomiting.  Genitourinary: Negative.  Negative for difficulty urinating, dysuria, flank pain, hematuria, pelvic pain, urgency, vaginal bleeding, vaginal discharge and vaginal pain.  Musculoskeletal: Positive for back pain. Negative for arthralgias, myalgias and neck pain.  Skin: Negative.  Negative for rash.  Neurological: Negative.  Negative for dizziness, syncope, weakness, light-headedness and headaches.     Physical Exam Updated Vital Signs BP 125/77 (BP Location: Right Arm)   Pulse 82   Temp 98.4 F (36.9 C) (Oral)   Resp 17   Ht 5\' 8"  (1.727 m)   Wt 101.6 kg (224 lb)   SpO2 100%   BMI 34.06 kg/m   Physical Exam  Constitutional: She is oriented to person, place, and time. She appears well-developed and well-nourished. No distress.  HENT:  Head: Normocephalic and atraumatic.    Right Ear: Hearing, tympanic membrane, external ear and ear canal normal.  Left Ear: Hearing, tympanic membrane, external ear and ear canal normal.  Nose: Nose normal. No nasal deformity.  Mouth/Throat: Uvula is midline, oropharynx is clear and moist and mucous membranes are normal. No trismus in the jaw. No uvula swelling. No oropharyngeal exudate, posterior oropharyngeal edema, posterior oropharyngeal erythema or tonsillar abscesses.  Dentures present. No  purulent discharge noted.  Mild rhinorrhea present.  Eyes: Pupils are equal, round, and reactive to light. Conjunctivae and EOM are normal.  Neck: Normal range of motion. Neck supple. No JVD present. No tracheal deviation present.  Cardiovascular: Normal rate, regular rhythm, normal heart sounds and intact distal pulses. Exam reveals no gallop and no friction rub.  No murmur heard. Pulmonary/Chest: Effort normal and breath sounds normal. No stridor. No respiratory distress. She has no wheezes.  Abdominal: Soft. Bowel sounds are normal. There is no tenderness. There is no rebound and no guarding.  Musculoskeletal: Normal range of motion. She exhibits no edema.       Lumbar back: She exhibits no bony tenderness and no deformity.       Back:  No midline spine TTP, no deformity, crepitus, or step-off noted. Patient with mild right lumbar paraspinal muscle tenderness, patient does not flinch when  pressing on the area states that her pain is minimal.  Negative CVA tenderness.   Lymphadenopathy:    She has no cervical adenopathy.  Neurological: She is alert and oriented to person, place, and time.  Skin: Skin is warm and dry.  Psychiatric: She has a normal mood and affect. Her behavior is normal.     ED Treatments / Results  Labs (all labs ordered are listed, but only abnormal results are displayed) Labs Reviewed  URINALYSIS, ROUTINE W REFLEX MICROSCOPIC    EKG None  Radiology No results found.  Procedures Procedures (including critical care time)  Medications Ordered in ED Medications - No data to display   Initial Impression / Assessment and Plan / ED Course  I have reviewed the triage vital signs and the nursing notes.  Pertinent labs & imaging results that were available during my care of the patient were reviewed by me and considered in my medical decision making (see chart for details).    Patient complaining of symptoms of sinusitis.    Mild sinusitis symptoms of  clear nasal discharge/congestion and scratchy throat with cough for less than 10 days.  Patient is afebrile.  No concern for acute bacterial rhinosinusitis; likely viral in nature.  Patient to be discharged with symptomatic treatment and Flonase.  Patient instructions given for warm saline nasal washes.  Patient informed to closely monitor her blood sugars.  Patient informed to rinse mouth and throat out after using Flonase.  Patient informed to follow-up with her primary care provider regarding her visit today.  Patient informed of return precautions regarding sinusitis.  Patient informed to return to the emergency department if she develops a fever, purulent discharge, increasing pain, headache, worsening cough, shortness of breath or any other new or worsening symptoms.  Patient states understanding of return precautions.  Patient with secondary complaint of right lower back pain.  No neurological deficits and normal neuro exam.  Patient can ambulate without pain.  No loss of bowel or bladder control.  No concern for cauda equina.  No fever, night sweats, weight loss, h/o cancer, IVDU.  No signs or symptoms suggesting urinary tract etiologies for her pain.  Normal urinalysis.  Pain is increased with light palpation to the right paraspinal muscles of the lower back.  Patient informed to use over-the-counter anti-inflammatory medications for her pain.  At this time there does not appear to be any evidence of an acute emergency medical condition and the patient appears stable for discharge with appropriate outpatient follow up. Diagnosis was discussed with patient who verbalizes understanding and is agreeable to discharge. I have discussed return precautions with patient who verbalizes understanding of return precautions. Patient strongly encouraged to follow-up with their PCP.  Patient's case discussed with Alyse Low PA-C who agrees with plan to discharge with follow-up.      Final Clinical  Impressions(s) / ED Diagnoses   Final diagnoses:  Acute non-recurrent maxillary sinusitis  Acute right-sided low back pain without sciatica    ED Discharge Orders        Ordered    fluticasone (FLONASE) 50 MCG/ACT nasal spray  Daily     06/07/18 2205       Gari Crown 06/08/18 0116    Charlesetta Shanks, MD 06/10/18 [phone]

## 2018-06-07 NOTE — ED Notes (Signed)
Pt verbalized understanding discharge instructions and denies any further needs or questions at this time. VS stable, ambulatory and steady gait.   

## 2018-06-07 NOTE — Discharge Instructions (Addendum)
Please follow-up with your primary care provider regarding your visit today. You may use fluticasone nasal spray as prescribed. You may take over-the-counter anti-inflammatory medication as needed for pain.  Contact a doctor if: You have a fever. Your symptoms get worse. Your symptoms do not get better within 10 days. Get help right away if: You have a very bad headache. You cannot stop throwing up (vomiting). You have pain or swelling around your face or eyes. You have trouble seeing. You feel confused. Your neck is stiff. You have trouble breathing. Contact a health care provider if: You have pain that is not relieved with rest or medicine. You have increasing pain going down into your legs or buttocks. Your pain does not improve in 2 weeks. You have pain at night. You lose weight. You have a fever or chills. Get help right away if: You develop new bowel or bladder control problems. You have unusual weakness or numbness in your arms or legs. You develop nausea or vomiting. You develop abdominal pain. You feel faint.

## 2018-06-07 NOTE — ED Provider Notes (Signed)
Patient placed in Quick Look pathway, seen and evaluated   Chief Complaint:   Nasal congestion, right low back pain  HPI:    Patient resides with 4 day history of aching right-sided low back pain. Denies any recent trauma or falls.  No urinary symptoms. Denies fevers or chills. Also endorses nasal congestion for 1 week. Has been using Sudafed with some relief.   Notes dry cough and mild sore throat, no shortness of breath or chest pain.  ROS:  Positive for nasal congestion, back pain , sore throat, cough  Negative for fever,  Chest pain, shortness of breath  Physical Exam:   Gen: No distress  Neuro: Awake and Alert  Skin: Warm    Focused Exam:  No frontal or maxillary sinus tenderness. Nasal septum midline with mild mucosal edema bilaterally. TMs without erythema or bulging. Lungs clear to auscultation bilaterally. Heart rate and rhythm regular. No midline spine tenderness, mild right para lumbar muscle tenderness no deformity, crepitus, or step-off noted. 5/5 strength of BLE major muscle groups. No CVA tenderness. Abdomen soft and nontender.   Initiation of care has begun. The patient has been counseled on the process, plan, and necessity for staying for the completion/evaluation, and the remainder of the medical screening examination     [name] 06/07/18 2029    [name], MD 06/07/18 2141

## 2018-06-07 NOTE — ED Triage Notes (Signed)
Patient c/o right sided back pain. Also c/o sinus congestion.

## 2018-06-24 ENCOUNTER — Encounter (INDEPENDENT_AMBULATORY_CARE_PROVIDER_SITE_OTHER): Payer: Self-pay

## 2018-06-24 ENCOUNTER — Encounter: Payer: Self-pay | Admitting: Nurse Practitioner

## 2018-06-24 ENCOUNTER — Ambulatory Visit (INDEPENDENT_AMBULATORY_CARE_PROVIDER_SITE_OTHER): Payer: Medicaid Other | Admitting: Nurse Practitioner

## 2018-06-24 VITALS — BP 110/76 | HR 80 | Ht 67.0 in | Wt 221.6 lb

## 2018-06-24 DIAGNOSIS — K5900 Constipation, unspecified: Secondary | ICD-10-CM | POA: Diagnosis not present

## 2018-06-24 MED ORDER — LINACLOTIDE 145 MCG PO CAPS: 145.0000 ug | ORAL_CAPSULE | Freq: Every day | ORAL | 1 refills | Status: DC

## 2018-06-24 NOTE — Progress Notes (Signed)
Reviewed and agree with documentation and assessment and plan. K. Veena Nandigam , MD   

## 2018-06-24 NOTE — Progress Notes (Signed)
      IMPRESSION and PLAN:    52 yo female with chronic constipation.  -recent colonoscopy remarkable for stercoral ulcers from constipation. Linzess called to incorrect pharmacy so she never got started on it. Constipated again, last BM > a week ago.  -Need to at least partially purge bowels. Take 1/2 Mg+ citrate today as directed on bottle     .  -samples of Linzess 145 mcg given and rx called to correct pharmacy. Start in am and takes 30 minutes before breakfast.  HPI:    Chief Complaint: persistent constipation, never got the linzess we called in   Patient is a 52 yo female who I saw late May for evaluation of chronic constipation and possible precancerous polyps in [place] years ago. She had colonoscopy 05/28/2018 with findings of several small rectal ulcers and internal hemorrhoid.  Ulcers were felt to be stercoral ulcer secondary to constipation / fecal impaction.  No dysplasia or malignancy on biopsies.  CMV stain negative.  Our plan was to call in a prescription for Linzess but it was apparently called to the incorrect pharmacy.  Patient comes in today requesting the medication.  She is still constipated, last bowel movement at least a week ago.  No abdominal pain.  No rectal bleeding.   Patient's surgical history, family medical history, social history, medications and allergies were all reviewed in Epic   Creatinine clearance cannot be calculated (Patient's most recent lab result is older than the maximum 21 days allowed.)   Physical Exam:     BP 110/76   Pulse 80   Ht 5\' 7"  (1.702 m)   Wt 221 lb 9.6 oz (100.5 kg)   BMI 34.71 kg/m   GENERAL:  Pleasant female in NAD PSYCH: : Cooperative, normal affect   Tye Savoy , NP 06/24/2018, 10:48 AM

## 2018-06-24 NOTE — Patient Instructions (Addendum)
If you are age 52 or older, your body mass index should be between 23-30. Your Body mass index is 34.71 kg/m. If this is out of the aforementioned range listed, please consider follow up with your Primary Care Provider.  If you are age 66 or younger, your body mass index should be between 19-25. Your Body mass index is 34.71 kg/m. If this is out of the aformentioned range listed, please consider follow up with your Primary Care Provider.   Take 1/2 bottle of Magnesium Citrate.  Start Linzess 145 mcg every day 30 minutes before breakfast STARTING TOMORROW.  Thank you for choosing me and [facility].   Tye Savoy, NP

## 2018-07-08 ENCOUNTER — Encounter (HOSPITAL_COMMUNITY): Payer: Self-pay

## 2018-07-08 ENCOUNTER — Ambulatory Visit (HOSPITAL_COMMUNITY)
Admission: EM | Admit: 2018-07-08 | Discharge: 2018-07-08 | Disposition: A | Discharge status: Home / Self Care | Payer: Medicaid Other | Attending: Family Medicine | Admitting: Family Medicine

## 2018-07-08 DIAGNOSIS — E785 Hyperlipidemia, unspecified: Secondary | ICD-10-CM

## 2018-07-08 DIAGNOSIS — I10 Essential (primary) hypertension: Secondary | ICD-10-CM

## 2018-07-08 DIAGNOSIS — J0101 Acute recurrent maxillary sinusitis: Secondary | ICD-10-CM

## 2018-07-08 DIAGNOSIS — IMO0001 Reserved for inherently not codable concepts without codable children: Secondary | ICD-10-CM

## 2018-07-08 DIAGNOSIS — Z794 Long term (current) use of insulin: Secondary | ICD-10-CM

## 2018-07-08 DIAGNOSIS — E119 Type 2 diabetes mellitus without complications: Secondary | ICD-10-CM

## 2018-07-08 MED ORDER — DOXYCYCLINE HYCLATE 100 MG PO CAPS: 100.0000 mg | ORAL_CAPSULE | Freq: Two times a day (BID) | ORAL | 0 refills | Status: DC

## 2018-07-08 NOTE — Discharge Instructions (Signed)
Continue to drink plenty of fluids Run a humidifier in your bedroom at night Continue with your Flonase, antihistamines, and nasal saline wash Take antibiotic twice a day as instructed Follow-up with your PCP

## 2018-07-08 NOTE — ED Provider Notes (Signed)
[place]    CSN: [id number] Arrival date & time: 07/08/18  1019     History   Chief Complaint Chief Complaint  Patient presents with  . Facial Pain    sinus infection    HPI Ellen Avery is a 52 y.o. female.   HPI  Patient has a history of recurring sinus infections.  She was just seen in the ER for sinus pressure July 1.  She states that she currently has been having symptoms for longer than 2 weeks.  She is taking Zyrtec.  She is using Flonase.  She is using her saline nasal washes.  In spite of this she has pressure and pain in the left cheek, headaches on the left side of her head, and postnasal drip that is persistent.  She feels like she has an infection.  She is usually given amoxicillin or Augmentin by her PCP.  She feels like these do not work anymore.  She has not had any fever.  She does feel body aches and increased fatigue.  Past Medical History:  Diagnosis Date  . Allergy   . Anxiety and depression   . Arthritis   . Depression   . Diabetes mellitus without complication ([place])   . Fibromyalgia   . GERD (gastroesophageal reflux disease)   . Hypercholesteremia   . Hypertension   . IBS (irritable bowel syndrome)   . Obesity   . Rectal ulcer     Patient Active Problem List   Diagnosis Date Noted  . IDDM (insulin dependent diabetes mellitus) ([place]) 07/08/2018  . HTN (hypertension) 07/08/2018  . HLD (hyperlipidemia) 07/08/2018  . Altered mental status 03/02/2016  . MDD (major depressive disorder), recurrent episode, moderate ([place]) 03/02/2016  . Psychoses [facility])     Past Surgical History:  Procedure Laterality Date  . ABDOMINAL HYSTERECTOMY     total hysterectomy    OB History   None      Home Medications    Prior to Admission medications   Medication Sig Start Date End Date Taking? Authorizing Provider  ACCU-CHEK AVIVA PLUS test strip USE TO CHECK BLOOD SUGAR 4 TIMES DAILY 05/25/18   [provider]  aspirin EC 81 MG tablet  Take 81 mg by mouth daily.    [provider]  Cholecalciferol (VITAMIN D3) 3000 units TABS Take 3,000 Units by mouth daily.    [provider]  cyclobenzaprine (FLEXERIL) 10 MG tablet Take 1 tablet (10 mg total) by mouth 2 (two) times daily as needed for muscle spasms. 12/28/17   Glyn Ade, PA-C  doxycycline (VIBRAMYCIN) 100 MG capsule Take 1 capsule (100 mg total) by mouth 2 (two) times daily. 07/08/18   Raylene Everts, MD  esomeprazole ([place]) 40 MG capsule Take one 30 minutes before breakfast 05/06/18   Willia Craze, NP  fluticasone [facility]) 50 MCG/ACT nasal spray Place 1 spray into both nostrils daily. 06/07/18   Nuala Alpha A, PA-C  insulin glargine (LANTUS) 100 UNIT/ML injection Inject 20 Units into the skin at bedtime.     [provider]  ipratropium (ATROVENT) 0.03 % nasal spray Use 2 sprays in each nostril 2 times daily 04/21/18   [provider]  linaclotide [facility]) 145 MCG CAPS capsule Take 1 capsule (145 mcg total) by mouth daily before breakfast. 06/24/18   Willia Craze, NP  losartan (COZAAR) 25 MG tablet Take 25 mg by mouth daily.    [provider]  metFORMIN (GLUCOPHAGE) 1000 MG  tablet Take 1,000 mg by mouth daily with breakfast.    [provider]  simvastatin (ZOCOR) 40 MG tablet Take 40 mg by mouth every evening. Reported on 03/01/2016    [provider]    Family History Family History  Problem Relation Age of Onset  . Diabetes Mother   . Heart disease Paternal Grandmother   . Diabetes Paternal Uncle   . Colon cancer Neg Hx   . Colon polyps Neg Hx   . Esophageal cancer Neg Hx   . Rectal cancer Neg Hx   . Stomach cancer Neg Hx     Social History Social History   Tobacco Use  . Smoking status: Current Every Day Smoker    Packs/day: 1.00    Types: Cigarettes  . Smokeless tobacco: Never Used  Substance Use Topics  . Alcohol use: No  . Drug use: No     Allergies   Flagyl  [metronidazole]   Review of Systems Review of Systems  Constitutional: Positive for fatigue. Negative for chills and fever.  HENT: Positive for postnasal drip, sinus pressure and sinus pain. Negative for ear pain and sore throat.   Eyes: Negative for pain and visual disturbance.  Respiratory: Negative for cough and shortness of breath.   Cardiovascular: Negative for chest pain and palpitations.  Gastrointestinal: Negative for abdominal pain and vomiting.  Genitourinary: Negative for dysuria and hematuria.  Musculoskeletal: Negative for arthralgias and back pain.  Skin: Negative for color change and rash.  Neurological: Positive for headaches. Negative for seizures and syncope.  All other systems reviewed and are negative.    Physical Exam Triage Vital Signs ED Triage Vitals [07/08/18 1035]  Enc Vitals Group     BP 134/82     Pulse Rate 81     Resp 20     Temp 98.4 F (36.9 C)     Temp Source Oral     SpO2 100 %     Weight      Height      Head Circumference      Peak Flow      Pain Score      Pain Loc      Pain Edu?      Excl. in [place]?    No data found.  Updated Vital Signs BP 134/82 (BP Location: Left Arm)   Pulse 81   Temp 98.4 F (36.9 C) (Oral)   Resp 20   SpO2 100%       Physical Exam  Constitutional: She appears well-developed and well-nourished. No distress.  HENT:  Head: Normocephalic and atraumatic.  Right Ear: External ear normal.  Left Ear: External ear normal.  Mouth/Throat: Oropharynx is clear and moist.  Nasal membranes are swollen and red.  Left maxillary area quite tender to touch.  TMs are clear.  Eyes: Pupils are equal, round, and reactive to light. Conjunctivae are normal.  Neck: Normal range of motion.  Cardiovascular: Normal rate, regular rhythm and normal heart sounds.  Pulmonary/Chest: Effort normal and breath sounds normal. No respiratory distress.  Abdominal: Soft. She exhibits no distension.  Musculoskeletal: Normal range of  motion. She exhibits no edema.  Lymphadenopathy:    She has cervical adenopathy.  Neurological: She is alert.  Skin: Skin is warm and dry.     UC Treatments / Results  Labs (all labs ordered are listed, but only abnormal results are displayed) Labs Reviewed - No data to display  EKG None  Radiology No results found.  Procedures Procedures (including critical care time)  Medications Ordered in UC Medications - No data to display  Initial Impression / Assessment and Plan / UC Course  I have reviewed the triage vital signs and the nursing notes.  Pertinent labs & imaging results that were available during my care of the patient were reviewed by me and considered in my medical decision making (see chart for details).    Patient with ongoing allergies.  Recurring sinus symptoms.  She is been evaluated by ENT.  She has not had any antibiotics in over a month.  She is been having symptoms for longer than 10 days, that are worsening.  Her exam is similar to examination of 06/07/2018.  I believe this warrants a course of antibiotics.  Follow-up with PCP.  Right nasal passage has swelling suggestive of a polyp.  May need to go back to ENT.  Final Clinical Impressions(s) / UC Diagnoses   Final diagnoses:  Acute recurrent maxillary sinusitis     Discharge Instructions     Continue to drink plenty of fluids Run a humidifier in your bedroom at night Continue with your Flonase, antihistamines, and nasal saline wash Take antibiotic twice a day as instructed Follow-up with your PCP   ED Prescriptions    Medication Sig Dispense Auth. Provider   doxycycline (VIBRAMYCIN) 100 MG capsule Take 1 capsule (100 mg total) by mouth 2 (two) times daily. 20 capsule Raylene Everts, MD     Controlled Substance Prescriptions [place] Controlled Substance Registry consulted? Not Applicable   Raylene Everts, MD 07/08/18 [phone]

## 2018-07-08 NOTE — ED Triage Notes (Signed)
Pt presents with sinus infection

## 2018-07-29 ENCOUNTER — Encounter: Payer: Medicaid Other | Admitting: Gastroenterology

## 2018-08-02 ENCOUNTER — Other Ambulatory Visit: Payer: Self-pay

## 2018-08-02 ENCOUNTER — Emergency Department (HOSPITAL_COMMUNITY)
Admission: EM | Admit: 2018-08-02 | Discharge: 2018-08-02 | Disposition: A | Discharge status: Home / Self Care | Payer: Medicaid Other | Attending: Emergency Medicine | Admitting: Emergency Medicine

## 2018-08-02 ENCOUNTER — Emergency Department (HOSPITAL_COMMUNITY): Payer: Medicaid Other

## 2018-08-02 DIAGNOSIS — M5489 Other dorsalgia: Secondary | ICD-10-CM | POA: Diagnosis not present

## 2018-08-02 DIAGNOSIS — E119 Type 2 diabetes mellitus without complications: Secondary | ICD-10-CM | POA: Diagnosis not present

## 2018-08-02 DIAGNOSIS — R079 Chest pain, unspecified: Secondary | ICD-10-CM | POA: Insufficient documentation

## 2018-08-02 DIAGNOSIS — Z7982 Long term (current) use of aspirin: Secondary | ICD-10-CM | POA: Insufficient documentation

## 2018-08-02 DIAGNOSIS — M542 Cervicalgia: Secondary | ICD-10-CM | POA: Insufficient documentation

## 2018-08-02 DIAGNOSIS — M549 Dorsalgia, unspecified: Secondary | ICD-10-CM

## 2018-08-02 DIAGNOSIS — Z794 Long term (current) use of insulin: Secondary | ICD-10-CM | POA: Insufficient documentation

## 2018-08-02 DIAGNOSIS — M25511 Pain in right shoulder: Secondary | ICD-10-CM | POA: Diagnosis present

## 2018-08-02 DIAGNOSIS — I10 Essential (primary) hypertension: Secondary | ICD-10-CM | POA: Diagnosis not present

## 2018-08-02 DIAGNOSIS — Z79899 Other long term (current) drug therapy: Secondary | ICD-10-CM | POA: Diagnosis not present

## 2018-08-02 DIAGNOSIS — F17210 Nicotine dependence, cigarettes, uncomplicated: Secondary | ICD-10-CM | POA: Insufficient documentation

## 2018-08-02 LAB — CBC
HEMATOCRIT: 45.8 % (ref 36.0–46.0)
HEMOGLOBIN: 14.7 g/dL (ref 12.0–15.0)
MCH: 27.8 pg (ref 26.0–34.0)
MCHC: 32.1 g/dL (ref 30.0–36.0)
MCV: 86.7 fL (ref 78.0–100.0)
Platelets: 311 10*3/uL (ref 150–400)
RBC: 5.28 MIL/uL — AB (ref 3.87–5.11)
RDW: 12.8 % (ref 11.5–15.5)
WBC: 7.8 10*3/uL (ref 4.0–10.5)

## 2018-08-02 LAB — BASIC METABOLIC PANEL
ANION GAP: 10 (ref 5–15)
BUN: 9 mg/dL (ref 6–20)
CO2: 29 mmol/L (ref 22–32)
Calcium: 10.2 mg/dL (ref 8.9–10.3)
Chloride: 100 mmol/L (ref 98–111)
Creatinine, Ser: 0.83 mg/dL (ref 0.44–1.00)
Glucose, Bld: 280 mg/dL — ABNORMAL HIGH (ref 70–99)
POTASSIUM: 4.2 mmol/L (ref 3.5–5.1)
Sodium: 139 mmol/L (ref 135–145)

## 2018-08-02 LAB — I-STAT TROPONIN, ED: TROPONIN I, POC: 0.01 ng/mL (ref 0.00–0.08)

## 2018-08-02 LAB — I-STAT BETA HCG BLOOD, ED (MC, WL, AP ONLY): I-stat hCG, quantitative: <5 m[IU]/mL (ref <5)

## 2018-08-02 MED ORDER — CYCLOBENZAPRINE HCL 10 MG PO TABS: 10.0000 mg | ORAL_TABLET | Freq: Two times a day (BID) | ORAL | 0 refills | Status: DC | PRN

## 2018-08-02 MED ORDER — HYDROCODONE-ACETAMINOPHEN 5-325 MG PO TABS
1.0000 | ORAL_TABLET | Freq: Once | ORAL | Status: AC
  Administered 2018-08-02: 1 via ORAL
  Filled 2018-08-02: qty 1

## 2018-08-02 MED ORDER — CYCLOBENZAPRINE HCL 10 MG PO TABS
10.0000 mg | ORAL_TABLET | Freq: Once | ORAL | Status: AC
  Administered 2018-08-02: 10 mg via ORAL
  Filled 2018-08-02: qty 1

## 2018-08-02 NOTE — ED Notes (Signed)
Patient transported to X-ray 

## 2018-08-02 NOTE — ED Triage Notes (Signed)
[facility]- pt coming from home with complaint of left arm pain and shoulder pain. Pt reports pain between her shoulder blades, radiation to her arm and jaw. Pt alert and oriented, no distress on arrival.

## 2018-08-02 NOTE — Discharge Instructions (Signed)
Please read and follow all provided instructions.  Your diagnoses today include:  1. Upper back pain on right side     Tests performed today include:  An EKG of your heart  A chest x-ray  Cardiac enzymes - a blood test for heart muscle damage  Blood counts and electrolytes  Vital signs. See below for your results today.   Medications prescribed:   Flexeril (cyclobenzaprine) - muscle relaxer medication  DO NOT drive or perform any activities that require you to be awake and alert because this medicine can make you drowsy.   Take any prescribed medications only as directed.  Follow-up instructions: Please follow-up with your primary care provider as soon as you can for further evaluation of your symptoms.   Return instructions:  SEEK IMMEDIATE MEDICAL ATTENTION IF:  You have severe chest pain, especially if the pain is crushing or pressure-like and spreads to the arms, back, neck, or jaw, or if you have sweating, nausea (feeling sick to your stomach), or shortness of breath. THIS IS AN EMERGENCY. Don't wait to see if the pain will go away. Get medical help at once. Call 911 or [phone] (operator). DO NOT drive yourself to the hospital.   Your chest pain gets worse and does not go away with rest.   You have an attack of chest pain lasting longer than usual, despite rest and treatment with the medications your caregiver has prescribed.   You wake from sleep with chest pain or shortness of breath.  You feel dizzy or faint.  You have chest pain not typical of your usual pain for which you originally saw your caregiver.   You have any other emergent concerns regarding your health.  Additional Information: Chest pain comes from many different causes. Your caregiver has diagnosed you as having chest pain that is not specific for one problem, but does not require admission.  You are at low risk for an acute heart condition or other serious illness.   Your vital signs today were: BP  110/74 (BP Location: Right Arm)    Pulse 67    Temp 98.8 F (37.1 C) (Oral)    Resp 20    SpO2 99%  If your blood pressure (BP) was elevated above 135/85 this visit, please have this repeated by your doctor within one month. --------------

## 2018-08-02 NOTE — ED Provider Notes (Signed)
[facility] EMERGENCY DEPARTMENT Provider Note   CSN: [id number] Arrival date & time: 08/02/18  1023     History   Chief Complaint Chief Complaint  Patient presents with  . Arm Pain    HPI Ellen Avery is a 52 y.o. female.  Patient with history of diabetes, hypertension, high cholesterol, smoking --presents to the emergency department with complaint of worsening right neck and shoulder pain over the past day.  Patient has a history of chronic pain in the same area due to an accident about 10 years ago.  She reports radiation of the pain to her arms and neck.  She states that she has had symptoms like this in the past, but when she gets them she comes to the emergency department to make sure nothing else is going on.  She reports chronic Flexeril use and running out 3 days ago.  She denies any chest pain or shortness of breath.  Symptoms are nonexertional.  They have been constant since about 6 PM yesterday.  Pain is worse with palpation of the shoulder blade area and with movement of her arms, especially on the right.  No abdominal pain.  No weakness, numbness or tingling of the arms or legs.  No fevers or cough.  No nausea, vomiting, or diarrhea.  No family history of heart disease in first-degree relatives. Patient denies risk factors for pulmonary embolism including: unilateral leg swelling, history of DVT/PE/other blood clots, use of exogenous hormones, recent immobilizations, recent surgery, recent travel (>4hr segment), malignancy, hemoptysis.       Past Medical History:  Diagnosis Date  . Allergy   . Anxiety and depression   . Arthritis   . Depression   . Diabetes mellitus without complication ([place])   . Fibromyalgia   . GERD (gastroesophageal reflux disease)   . Hypercholesteremia   . Hypertension   . IBS (irritable bowel syndrome)   . Obesity   . Rectal ulcer     Patient Active Problem List   Diagnosis Date Noted  . IDDM (insulin dependent  diabetes mellitus) ([place]) 07/08/2018  . HTN (hypertension) 07/08/2018  . HLD (hyperlipidemia) 07/08/2018  . Altered mental status 03/02/2016  . MDD (major depressive disorder), recurrent episode, moderate ([place]) 03/02/2016  . Psychoses [facility])     Past Surgical History:  Procedure Laterality Date  . ABDOMINAL HYSTERECTOMY     total hysterectomy     OB History   None      Home Medications    Prior to Admission medications   Medication Sig Start Date End Date Taking? Authorizing Provider  aspirin EC 81 MG tablet Take 81 mg by mouth daily.   Yes [provider]  Cholecalciferol (VITAMIN D3) 3000 units TABS Take 3,000 Units by mouth daily.   Yes [provider]  fluticasone (FLONASE) 50 MCG/ACT nasal spray Place 1 spray into both nostrils daily. Patient taking differently: Place 1 spray into both nostrils daily as needed for allergies.  06/07/18  Yes Nuala Alpha A, PA-C  insulin glargine (LANTUS) 100 UNIT/ML injection Inject 15 Units into the skin at bedtime.    Yes [provider]  linaclotide Rolan Lipa) 145 MCG CAPS capsule Take 1 capsule (145 mcg total) by mouth daily before breakfast. 06/24/18  Yes Willia Craze, NP  losartan (COZAAR) 25 MG tablet Take 25 mg by mouth daily.   Yes [provider]  metFORMIN (GLUCOPHAGE) 1000 MG tablet Take 1,000 mg by mouth daily with breakfast.  Yes [provider]  simvastatin (ZOCOR) 40 MG tablet Take 40 mg by mouth every evening. Reported on 03/01/2016   Yes [provider]  ACCU-CHEK AVIVA PLUS test strip USE TO CHECK BLOOD SUGAR 4 TIMES DAILY 05/25/18   [provider]  cyclobenzaprine (FLEXERIL) 10 MG tablet Take 1 tablet (10 mg total) by mouth 2 (two) times daily as needed for muscle spasms. 08/02/18   Carlisle Cater, PA-C  esomeprazole (NEXIUM) 40 MG capsule Take one 30 minutes before breakfast Patient not taking: Reported on 08/02/2018 05/06/18   Willia Craze, NP     Family History Family History  Problem Relation Age of Onset  . Diabetes Mother   . Heart disease Paternal Grandmother   . Diabetes Paternal Uncle   . Colon cancer Neg Hx   . Colon polyps Neg Hx   . Esophageal cancer Neg Hx   . Rectal cancer Neg Hx   . Stomach cancer Neg Hx     Social History Social History   Tobacco Use  . Smoking status: Current Every Day Smoker    Packs/day: 1.00    Types: Cigarettes  . Smokeless tobacco: Never Used  Substance Use Topics  . Alcohol use: No  . Drug use: No     Allergies   Flagyl [metronidazole]   Review of Systems Review of Systems  Constitutional: Negative for diaphoresis and fever.  Eyes: Negative for redness.  Respiratory: Negative for cough and shortness of breath.   Cardiovascular: Positive for chest pain. Negative for palpitations and leg swelling.  Gastrointestinal: Negative for abdominal pain, nausea and vomiting.  Genitourinary: Negative for dysuria.  Musculoskeletal: Positive for back pain and neck pain.  Skin: Negative for rash.  Neurological: Negative for syncope and light-headedness.  Psychiatric/Behavioral: The patient is not nervous/anxious.      Physical Exam Updated Vital Signs BP 110/74 (BP Location: Right Arm)   Pulse 67   Temp 98.8 F (37.1 C) (Oral)   Resp 20   SpO2 99%   Physical Exam  Constitutional: She appears well-developed and well-nourished.  HENT:  Head: Normocephalic and atraumatic.  Mouth/Throat: Mucous membranes are normal. Mucous membranes are not dry.  Eyes: Conjunctivae are normal.  Neck: Trachea normal and normal range of motion. Neck supple. Normal carotid pulses and no JVD present. No muscular tenderness present. Carotid bruit is not present. No tracheal deviation present.  Cardiovascular: Normal rate, regular rhythm, S1 normal, S2 normal, normal heart sounds and intact distal pulses. Exam reveals no decreased pulses.  No murmur heard. Pulmonary/Chest: Effort normal. No  respiratory distress. She has no wheezes. She exhibits no tenderness.  Abdominal: Soft. Normal aorta and bowel sounds are normal. There is no tenderness. There is no rebound and no guarding.  Musculoskeletal:       Right shoulder: She exhibits decreased range of motion. She exhibits no tenderness.       Left shoulder: She exhibits normal range of motion and no tenderness.       Cervical back: She exhibits tenderness. She exhibits normal range of motion and no bony tenderness.       Thoracic back: She exhibits tenderness. She exhibits normal range of motion and no bony tenderness.       Lumbar back: She exhibits normal range of motion, no tenderness and no bony tenderness.       Back:  Neurological: She is alert.  Skin: Skin is warm and dry. She is not diaphoretic. No cyanosis. No pallor.  Psychiatric:  She has a normal mood and affect.  Nursing note and vitals reviewed.    ED Treatments / Results  Labs (all labs ordered are listed, but only abnormal results are displayed) Labs Reviewed  BASIC METABOLIC PANEL - Abnormal; Notable for the following components:      Result Value   Glucose, Bld 280 (*)    All other components within normal limits  CBC - Abnormal; Notable for the following components:   RBC 5.28 (*)    All other components within normal limits  I-STAT TROPONIN, ED  I-STAT BETA HCG BLOOD, ED ([facility], [facility], [facility] ONLY)    ED ECG REPORT   Date: 08/02/2018  Rate: 84  Rhythm: normal sinus rhythm  QRS Axis: normal  Intervals: normal  ST/T Wave abnormalities: nonspecific T wave changes  Conduction Disutrbances:none  Narrative Interpretation:   Old EKG Reviewed: unchanged except slower today  I have personally reviewed the EKG tracing and agree with the computerized printout as noted.  Radiology Dg Chest 2 View  Result Date: 08/02/2018 CLINICAL DATA:  Left-sided chest pain EXAM: CHEST - 2 VIEW COMPARISON:  03/01/2016 FINDINGS: Cardiac shadows within normal limits. The lungs  are well aerated without focal infiltrate or sizable effusion. No acute bony abnormality is noted. IMPRESSION: No active cardiopulmonary disease. Electronically Signed   By: Inez Catalina M.D.   On: 08/02/2018 11:09    Procedures Procedures (including critical care time)  Medications Ordered in ED Medications  cyclobenzaprine (FLEXERIL) tablet 10 mg (has no administration in time range)     Initial Impression / Assessment and Plan / ED Course  I have reviewed the triage vital signs and the nursing notes.  Pertinent labs & imaging results that were available during my care of the patient were reviewed by me and considered in my medical decision making (see chart for details).     Patient seen and examined. Work-up reviewed. Medications ordered.   Vital signs reviewed and are as follows: BP 110/74 (BP Location: Right Arm)   Pulse 67   Temp 98.8 F (37.1 C) (Oral)   Resp 20   SpO2 99%   12:52 PM Patient was counseled to return with severe chest pain, especially if the pain is crushing or pressure-like and spreads to the arms, back, neck, or jaw, or if they have sweating, nausea, or shortness of breath with the pain. They were encouraged to call 911 with these symptoms.   They were also told to return if their chest pain gets worse and does not go away with rest, they have an attack of chest pain lasting longer than usual despite rest and treatment with the medications their caregiver has prescribed, if they wake from sleep with chest pain or shortness of breath, if they feel dizzy or faint, if they have chest pain not typical of their usual pain, or if they have any other emergent concerns regarding their health.  The patient verbalized understanding and agreed.    Final Clinical Impressions(s) / ED Diagnoses   Final diagnoses:  Upper back pain on right side   Patient with neck and back pain with radiation to her arms.  No chest pain or shortness of breath.  She does have cardiac  risk factors.  EKG is relatively unchanged without signs of ischemia today.  Troponin negative.  Pain is been constant since 6 PM yesterday.  Pain is not typical.  No exertional symptoms.  Pain is readily reproducible with palpation over the trapezius musculature and with  movement of her right arm.  Upper extremities are neurovascularly intact.  She has no chest pain or abdominal pain or other neurological or peripheral findings to suggest dissection today.  Symptoms are similar to previous pain that she has had with her chronic neck issues.  I do not feel that admission or further work-up is indicated at this time.  Her symptoms are not suggestive of ACS, PE, or dissection.  Chest x-ray is clear without signs of pneumonia.  Comfortable discharged home with symptomatic treatment at this time.   ED Discharge Orders         Ordered    cyclobenzaprine (FLEXERIL) 10 MG tablet  2 times daily PRN     08/02/18 1245           Carlisle Cater, PA-C 08/02/18 1254    Tegeler, Gwenyth Allegra, MD 08/02/18 [phone]

## 2018-08-30 ENCOUNTER — Encounter (HOSPITAL_COMMUNITY): Payer: Self-pay

## 2018-08-30 ENCOUNTER — Emergency Department (HOSPITAL_COMMUNITY)
Admission: EM | Admit: 2018-08-30 | Discharge: 2018-08-30 | Disposition: A | Discharge status: Home / Self Care | Payer: Medicaid Other | Attending: Emergency Medicine | Admitting: Emergency Medicine

## 2018-08-30 ENCOUNTER — Other Ambulatory Visit: Payer: Self-pay

## 2018-08-30 DIAGNOSIS — M546 Pain in thoracic spine: Secondary | ICD-10-CM | POA: Insufficient documentation

## 2018-08-30 DIAGNOSIS — Z7982 Long term (current) use of aspirin: Secondary | ICD-10-CM | POA: Diagnosis not present

## 2018-08-30 DIAGNOSIS — Z794 Long term (current) use of insulin: Secondary | ICD-10-CM | POA: Insufficient documentation

## 2018-08-30 DIAGNOSIS — I10 Essential (primary) hypertension: Secondary | ICD-10-CM | POA: Diagnosis not present

## 2018-08-30 DIAGNOSIS — F17210 Nicotine dependence, cigarettes, uncomplicated: Secondary | ICD-10-CM | POA: Diagnosis not present

## 2018-08-30 DIAGNOSIS — Z79899 Other long term (current) drug therapy: Secondary | ICD-10-CM | POA: Insufficient documentation

## 2018-08-30 DIAGNOSIS — G8929 Other chronic pain: Secondary | ICD-10-CM

## 2018-08-30 DIAGNOSIS — E119 Type 2 diabetes mellitus without complications: Secondary | ICD-10-CM | POA: Insufficient documentation

## 2018-08-30 MED ORDER — HYDROCODONE-ACETAMINOPHEN 5-325 MG PO TABS
2.0000 | ORAL_TABLET | Freq: Once | ORAL | Status: AC
  Administered 2018-08-30: 2 via ORAL
  Filled 2018-08-30: qty 2

## 2018-08-30 MED ORDER — FLUTICASONE PROPIONATE 50 MCG/ACT NA SUSP: 1.0000 | Freq: Every day | NASAL | 0 refills | Status: DC

## 2018-08-30 MED ORDER — CYCLOBENZAPRINE HCL 10 MG PO TABS: 10.0000 mg | ORAL_TABLET | Freq: Two times a day (BID) | ORAL | 0 refills | Status: DC | PRN

## 2018-08-30 MED ORDER — LIDOCAINE 5 % EX PTCH: 1.0000 | MEDICATED_PATCH | CUTANEOUS | 0 refills | Status: DC

## 2018-08-30 MED ORDER — SALINE SPRAY 0.65 % NA SOLN: 1.0000 | NASAL | 0 refills | Status: DC | PRN

## 2018-08-30 MED ORDER — NAPROXEN 500 MG PO TABS: 500.0000 mg | ORAL_TABLET | Freq: Two times a day (BID) | ORAL | 0 refills | Status: DC

## 2018-08-30 NOTE — ED Triage Notes (Signed)
Pt from home with complaints of nasal congestion and bilateral shoulder pain for the last 5 days.  Worse pain on the left. Hx of arthritis in the neck.  A&Ox4.

## 2018-08-30 NOTE — ED Provider Notes (Signed)
[facility] EMERGENCY DEPARTMENT Provider Note   CSN: [id number] Arrival date & time: 08/30/18  1620     History   Chief Complaint Chief Complaint  Patient presents with  . Nasal Congestion  . Shoulder Pain    HPI Ellen Avery is a 52 y.o. female with history of hypertension, diabetes, fibromyalgia, chronic thoracic back pain and spasm who presents with ongoing upper back pain.  She has been taking Robaxin and Naprosyn with some relief, however found more relief with her previous Flexeril.  She denies any numbness or tingling.  She also reports of nasal congestion for about 1 week.  She has had left ear pain as well.  She has been taking Zyrtec at home for symptoms.  She has an appointment with her PCP in 1 week for further management of her upper back pain.  She reports seeing an orthopedic doctor several years ago.  HPI  Past Medical History:  Diagnosis Date  . Allergy   . Anxiety and depression   . Arthritis   . Depression   . Diabetes mellitus without complication ([place])   . Fibromyalgia   . GERD (gastroesophageal reflux disease)   . Hypercholesteremia   . Hypertension   . IBS (irritable bowel syndrome)   . Obesity   . Rectal ulcer     Patient Active Problem List   Diagnosis Date Noted  . IDDM (insulin dependent diabetes mellitus) ([place]) 07/08/2018  . HTN (hypertension) 07/08/2018  . HLD (hyperlipidemia) 07/08/2018  . Altered mental status 03/02/2016  . MDD (major depressive disorder), recurrent episode, moderate ([place]) 03/02/2016  . Psychoses [facility])     Past Surgical History:  Procedure Laterality Date  . ABDOMINAL HYSTERECTOMY     total hysterectomy     OB History   None      Home Medications    Prior to Admission medications   Medication Sig Start Date End Date Taking? Authorizing Provider  ACCU-CHEK AVIVA PLUS test strip USE TO CHECK BLOOD SUGAR 4 TIMES DAILY 05/25/18   [provider]  aspirin EC 81 MG tablet Take 81 mg  by mouth daily.    [provider]  Cholecalciferol (VITAMIN D3) 3000 units TABS Take 3,000 Units by mouth daily.    [provider]  cyclobenzaprine (FLEXERIL) 10 MG tablet Take 1 tablet (10 mg total) by mouth 2 (two) times daily as needed for muscle spasms. 08/30/18   Genae Strine, [name], PA-C  esomeprazole (NEXIUM) 40 MG capsule Take one 30 minutes before breakfast Patient not taking: Reported on 08/02/2018 05/06/18   Willia Craze, NP  fluticasone [facility]) 50 MCG/ACT nasal spray Place 1 spray into both nostrils daily. Patient taking differently: Place 1 spray into both nostrils daily as needed for allergies.  06/07/18   Nuala Alpha A, PA-C  fluticasone (FLONASE) 50 MCG/ACT nasal spray Place 1 spray into both nostrils daily. 08/30/18   Yanelis Osika, [name], PA-C  insulin glargine (LANTUS) 100 UNIT/ML injection Inject 15 Units into the skin at bedtime.     [provider]  lidocaine (LIDODERM) 5 % Place 1 patch onto the skin daily. Remove & Discard patch within 12 hours or as directed by MD 08/30/18   Frederica Kuster, PA-C  linaclotide [facility]) 145 MCG CAPS capsule Take 1 capsule (145 mcg total) by mouth daily before breakfast. 06/24/18   Willia Craze, NP  losartan (COZAAR) 25 MG tablet Take 25 mg by mouth daily.    [provider]  metFORMIN (GLUCOPHAGE) 1000 MG tablet Take 1,000 mg by mouth daily with breakfast.    [provider]  naproxen (NAPROSYN) 500 MG tablet Take 1 tablet (500 mg total) by mouth 2 (two) times daily. 08/30/18   Takirah Binford, [name], PA-C  simvastatin (ZOCOR) 40 MG tablet Take 40 mg by mouth every evening. Reported on 03/01/2016    [provider]  sodium chloride (OCEAN) 0.65 % SOLN nasal spray Place 1 spray into both nostrils as needed for congestion. 08/30/18   Frederica Kuster, PA-C    Family History Family History  Problem Relation Age of Onset  . Diabetes Mother   . Heart disease Paternal Grandmother   . Diabetes  Paternal Uncle   . Colon cancer Neg Hx   . Colon polyps Neg Hx   . Esophageal cancer Neg Hx   . Rectal cancer Neg Hx   . Stomach cancer Neg Hx     Social History Social History   Tobacco Use  . Smoking status: Current Every Day Smoker    Packs/day: 1.00    Types: Cigarettes  . Smokeless tobacco: Never Used  Substance Use Topics  . Alcohol use: No  . Drug use: No     Allergies   Flagyl [metronidazole]   Review of Systems Review of Systems  Constitutional: Negative for fever.  HENT: Positive for congestion and ear pain.   Respiratory: Negative for cough and shortness of breath.   Cardiovascular: Negative for chest pain.  Musculoskeletal: Positive for back pain.  Neurological: Negative for numbness.     Physical Exam Updated Vital Signs BP 108/74 (BP Location: Right Arm)   Pulse 74   Temp 99 F (37.2 C) (Oral)   Resp 16   Ht 5\' 7"  (1.702 m)   Wt 99.8 kg   SpO2 100%   BMI 34.46 kg/m   Physical Exam  Constitutional: She appears well-developed and well-nourished. No distress.  HENT:  Head: Normocephalic and atraumatic.  Right Ear: Tympanic membrane normal.  Left Ear: No mastoid tenderness. Tympanic membrane is not erythematous and not bulging. A middle ear effusion is present.  Nose: Mucosal edema present.  Mouth/Throat: Oropharynx is clear and moist. No oropharyngeal exudate.  Eyes: Pupils are equal, round, and reactive to light. Conjunctivae are normal. Right eye exhibits no discharge. Left eye exhibits no discharge. No scleral icterus.  Neck: Normal range of motion. Neck supple. No thyromegaly present.  Cardiovascular: Normal rate, regular rhythm, normal heart sounds and intact distal pulses. Exam reveals no gallop and no friction rub.  No murmur heard. Pulmonary/Chest: Effort normal and breath sounds normal. No stridor. No respiratory distress. She has no wheezes. She has no rales.  Abdominal: Soft. Bowel sounds are normal. She exhibits no distension.  There is no tenderness. There is no rebound and no guarding.  Musculoskeletal: She exhibits no edema.  Tenderness to bilateral upper trapezius muscles, found to be in spasm; no midline cervical, thoracic, or lumbar tenderness  Lymphadenopathy:    She has no cervical adenopathy.  Neurological: She is alert. Coordination normal.  Normal sensation and 5/5 strength to bilateral upper extremities, equal bilateral grip strength  Skin: Skin is warm and dry. No rash noted. She is not diaphoretic. No pallor.  Psychiatric: She has a normal mood and affect.  Nursing note and vitals reviewed.    ED Treatments / Results  Labs (all labs ordered are listed, but only abnormal results are displayed) Labs Reviewed - No data to display  EKG None  Radiology No results found.  Procedures Procedures (including critical care time)  Medications Ordered in ED Medications  HYDROcodone-acetaminophen (NORCO/VICODIN) 5-325 MG per tablet 2 tablet (2 tablets Oral Given 08/30/18 1813)     Initial Impression / Assessment and Plan / ED Course  I have reviewed the triage vital signs and the nursing notes.  Pertinent labs & imaging results that were available during my care of the patient were reviewed by me and considered in my medical decision making (see chart for details).     Patient presenting with chronic upper back pain.  Upper trapezius were found to be in spasm.  Patient requesting stronger pain medication, however advised narcotics were not indicated for chronic pain.  Patient offered lidocaine patches, Flexeril instead of Robaxin, and Naprosyn.  She accepts.  Will have patient follow-up with PCP for further management of this.  Will discharge home with Flonase and nasal saline for nasal congestion.  No signs of otitis media at this time.  Patient is afebrile.  Return precautions discussed.  Patient understands and agrees with plan.  Patient vitals stable throughout ED course and discharged in  satisfactory condition.  Final Clinical Impressions(s) / ED Diagnoses   Final diagnoses:  Chronic bilateral thoracic back pain    ED Discharge Orders         Ordered    lidocaine (LIDODERM) 5 %  Every 24 hours     08/30/18 1824    cyclobenzaprine (FLEXERIL) 10 MG tablet  2 times daily PRN,   Status:  Discontinued     08/30/18 1824    naproxen (NAPROSYN) 500 MG tablet  2 times daily     08/30/18 1824    cyclobenzaprine (FLEXERIL) 10 MG tablet  2 times daily PRN     08/30/18 1826    fluticasone (FLONASE) 50 MCG/ACT nasal spray  Daily     08/30/18 1828    sodium chloride (OCEAN) 0.65 % SOLN nasal spray  As needed     08/30/18 1828           Frederica Kuster, PA-C 08/30/18 [place], Adam, DO 08/31/18 0116

## 2018-08-30 NOTE — Discharge Instructions (Addendum)
Apply lidocaine patch and change once daily.  Take Flexeril as prescribed, as needed for muscle pain and spasms.  Do not drive or operate machinery while taking Flexeril.  Do not combine with Robaxin.  Continue taking Naprosyn as prescribed.  Please follow-up with your doctor as planned.  Please return the emergency department if you develop any new or worsening symptoms.

## 2018-09-13 ENCOUNTER — Emergency Department (HOSPITAL_COMMUNITY)
Admission: EM | Admit: 2018-09-13 | Discharge: 2018-09-13 | Disposition: A | Discharge status: Home / Self Care | Payer: Medicaid Other | Attending: Emergency Medicine | Admitting: Emergency Medicine

## 2018-09-13 ENCOUNTER — Other Ambulatory Visit: Payer: Self-pay

## 2018-09-13 ENCOUNTER — Encounter (HOSPITAL_COMMUNITY): Payer: Self-pay | Admitting: Emergency Medicine

## 2018-09-13 DIAGNOSIS — Z7982 Long term (current) use of aspirin: Secondary | ICD-10-CM | POA: Diagnosis not present

## 2018-09-13 DIAGNOSIS — F319 Bipolar disorder, unspecified: Secondary | ICD-10-CM | POA: Insufficient documentation

## 2018-09-13 DIAGNOSIS — Z794 Long term (current) use of insulin: Secondary | ICD-10-CM | POA: Diagnosis not present

## 2018-09-13 DIAGNOSIS — I10 Essential (primary) hypertension: Secondary | ICD-10-CM | POA: Insufficient documentation

## 2018-09-13 DIAGNOSIS — Z79899 Other long term (current) drug therapy: Secondary | ICD-10-CM | POA: Insufficient documentation

## 2018-09-13 DIAGNOSIS — F17210 Nicotine dependence, cigarettes, uncomplicated: Secondary | ICD-10-CM | POA: Insufficient documentation

## 2018-09-13 DIAGNOSIS — Z9119 Patient's noncompliance with other medical treatment and regimen: Secondary | ICD-10-CM | POA: Diagnosis not present

## 2018-09-13 DIAGNOSIS — E119 Type 2 diabetes mellitus without complications: Secondary | ICD-10-CM | POA: Insufficient documentation

## 2018-09-13 DIAGNOSIS — F419 Anxiety disorder, unspecified: Secondary | ICD-10-CM | POA: Diagnosis present

## 2018-09-13 LAB — COMPREHENSIVE METABOLIC PANEL
ALK PHOS: 74 U/L (ref 38–126)
ALT: 14 U/L (ref 0–44)
AST: 17 U/L (ref 15–41)
Albumin: 3.6 g/dL (ref 3.5–5.0)
Anion gap: 11 (ref 5–15)
BUN: 8 mg/dL (ref 6–20)
CALCIUM: 9.5 mg/dL (ref 8.9–10.3)
CO2: 25 mmol/L (ref 22–32)
CREATININE: 0.77 mg/dL (ref 0.44–1.00)
Chloride: 103 mmol/L (ref 98–111)
Glucose, Bld: 229 mg/dL — ABNORMAL HIGH (ref 70–99)
Potassium: 3.3 mmol/L — ABNORMAL LOW (ref 3.5–5.1)
SODIUM: 139 mmol/L (ref 135–145)
TOTAL PROTEIN: 6.5 g/dL (ref 6.5–8.1)
Total Bilirubin: 0.4 mg/dL (ref 0.3–1.2)

## 2018-09-13 LAB — RAPID URINE DRUG SCREEN, HOSP PERFORMED
AMPHETAMINES: NOT DETECTED
BENZODIAZEPINES: NOT DETECTED
Barbiturates: NOT DETECTED
Cocaine: NOT DETECTED
OPIATES: NOT DETECTED
TETRAHYDROCANNABINOL: NOT DETECTED

## 2018-09-13 LAB — CBC
HCT: 40.2 % (ref 36.0–46.0)
HEMOGLOBIN: 12.8 g/dL (ref 12.0–15.0)
MCH: 27.7 pg (ref 26.0–34.0)
MCHC: 31.8 g/dL (ref 30.0–36.0)
MCV: 87 fL (ref 78.0–100.0)
Platelets: 347 10*3/uL (ref 150–400)
RBC: 4.62 MIL/uL (ref 3.87–5.11)
RDW: 13 % (ref 11.5–15.5)
WBC: 9.8 10*3/uL (ref 4.0–10.5)

## 2018-09-13 LAB — ETHANOL: Alcohol, Ethyl (B): <10 mg/dL (ref <10)

## 2018-09-13 LAB — I-STAT BETA HCG BLOOD, ED (MC, WL, AP ONLY): I-stat hCG, quantitative: <5 m[IU]/mL (ref <5)

## 2018-09-13 MED ORDER — HYDROXYZINE HCL 25 MG PO TABS
50.0000 mg | ORAL_TABLET | Freq: Once | ORAL | Status: AC
  Administered 2018-09-13: 50 mg via ORAL
  Filled 2018-09-13: qty 2

## 2018-09-13 MED ORDER — HYDROXYZINE HCL 25 MG PO TABS: 25.0000 mg | ORAL_TABLET | Freq: Four times a day (QID) | ORAL | 0 refills | Status: DC | PRN

## 2018-09-13 NOTE — ED Provider Notes (Signed)
[place] EMERGENCY DEPARTMENT Provider Note   CSN: [id number] Arrival date & time: 09/13/18  2154     History   Chief Complaint Chief Complaint  Patient presents with  . Depression    HPI Ellen Avery is a 52 y.o. female.  HPI  This is a 52 year old female with a history of reported bipolar disorder, anxiety, depression who presents with increasing feelings of anxiety.  Patient reports that she has not been on her medications for 1 month.  She thought she was doing well but over the last week has had increasing symptoms of anxiety.  She states she feels like someone is going to come after her.  She denies hearing or seeing anything that is not they are.  She states that she is very anxious and has not been able to sleep.  She also reports increasing feelings of depression.  She denies any HI or SI.  Patient reports that she is due to see a psychiatrist in 1 week.  She is not currently taking any psychiatric medications but previously has been on multiple medications include SSRIs.  Patient denies any physical complaints at this time.  She states that the thoughts became overwhelming today and that is why she came to the emergency room.  Patient reports decreased sleep and appetite.  He denies any alcohol or drug use.  Past Medical History:  Diagnosis Date  . Allergy   . Anxiety and depression   . Arthritis   . Depression   . Diabetes mellitus without complication ([place])   . Fibromyalgia   . GERD (gastroesophageal reflux disease)   . Hypercholesteremia   . Hypertension   . IBS (irritable bowel syndrome)   . Obesity   . Rectal ulcer     Patient Active Problem List   Diagnosis Date Noted  . IDDM (insulin dependent diabetes mellitus) ([place]) 07/08/2018  . HTN (hypertension) 07/08/2018  . HLD (hyperlipidemia) 07/08/2018  . Altered mental status 03/02/2016  . MDD (major depressive disorder), recurrent episode, moderate ([place]) 03/02/2016  . Psychoses [facility])       Past Surgical History:  Procedure Laterality Date  . ABDOMINAL HYSTERECTOMY     total hysterectomy     OB History   None      Home Medications    Prior to Admission medications   Medication Sig Start Date End Date Taking? Authorizing Provider  ACCU-CHEK AVIVA PLUS test strip USE TO CHECK BLOOD SUGAR 4 TIMES DAILY 05/25/18   [provider]  aspirin EC 81 MG tablet Take 81 mg by mouth daily.    [provider]  Cholecalciferol (VITAMIN D3) 3000 units TABS Take 3,000 Units by mouth daily.    [provider]  cyclobenzaprine (FLEXERIL) 10 MG tablet Take 1 tablet (10 mg total) by mouth 2 (two) times daily as needed for muscle spasms. 08/30/18   Law, [name], PA-C  esomeprazole (NEXIUM) 40 MG capsule Take one 30 minutes before breakfast Patient not taking: Reported on 08/02/2018 05/06/18   Willia Craze, NP  fluticasone [facility]) 50 MCG/ACT nasal spray Place 1 spray into both nostrils daily. Patient taking differently: Place 1 spray into both nostrils daily as needed for allergies.  06/07/18   Nuala Alpha A, PA-C  fluticasone (FLONASE) 50 MCG/ACT nasal spray Place 1 spray into both nostrils daily. 08/30/18   Law, [name], PA-C  insulin glargine (LANTUS) 100 UNIT/ML injection Inject 15 Units into the skin at bedtime.  [provider]  lidocaine (LIDODERM) 5 % Place 1 patch onto the skin daily. Remove & Discard patch within 12 hours or as directed by MD 08/30/18   Frederica Kuster, PA-C  linaclotide [facility]) 145 MCG CAPS capsule Take 1 capsule (145 mcg total) by mouth daily before breakfast. 06/24/18   Willia Craze, NP  losartan (COZAAR) 25 MG tablet Take 25 mg by mouth daily.    [provider]  metFORMIN (GLUCOPHAGE) 1000 MG tablet Take 1,000 mg by mouth daily with breakfast.    [provider]  naproxen (NAPROSYN) 500 MG tablet Take 1 tablet (500 mg total) by mouth 2 (two) times daily. 08/30/18   Law, [name],  PA-C  simvastatin (ZOCOR) 40 MG tablet Take 40 mg by mouth every evening. Reported on 03/01/2016    [provider]  sodium chloride (OCEAN) 0.65 % SOLN nasal spray Place 1 spray into both nostrils as needed for congestion. 08/30/18   Frederica Kuster, PA-C    Family History Family History  Problem Relation Age of Onset  . Diabetes Mother   . Heart disease Paternal Grandmother   . Diabetes Paternal Uncle   . Colon cancer Neg Hx   . Colon polyps Neg Hx   . Esophageal cancer Neg Hx   . Rectal cancer Neg Hx   . Stomach cancer Neg Hx     Social History Social History   Tobacco Use  . Smoking status: Current Every Day Smoker    Packs/day: 1.00    Types: Cigarettes  . Smokeless tobacco: Never Used  Substance Use Topics  . Alcohol use: No  . Drug use: No     Allergies   Flagyl [metronidazole]   Review of Systems Review of Systems  Constitutional: Negative for fever.  Respiratory: Negative for shortness of breath.   Cardiovascular: Negative for chest pain.  Psychiatric/Behavioral: Positive for dysphoric mood and sleep disturbance. Negative for hallucinations and self-injury. The patient is nervous/anxious.   All other systems reviewed and are negative.    Physical Exam Updated Vital Signs BP 135/89 (BP Location: Right Arm)   Pulse 83   Temp 98.8 F (37.1 C) (Oral)   Resp 16   SpO2 98%   Physical Exam  Constitutional: She is oriented to person, place, and time. She appears well-developed and well-nourished.  Obese but nontoxic-appearing  HENT:  Head: Normocephalic and atraumatic.  Eyes: Pupils are equal, round, and reactive to light.  Cardiovascular: Normal rate, regular rhythm and normal heart sounds.  Pulmonary/Chest: Effort normal and breath sounds normal. No respiratory distress. She has no wheezes.  Abdominal: Soft. There is no tenderness.  Neurological: She is alert and oriented to person, place, and time.  Skin: Skin is warm and dry.    Psychiatric:  Depressed affect  Nursing note and vitals reviewed.    ED Treatments / Results  Labs (all labs ordered are listed, but only abnormal results are displayed) Labs Reviewed  COMPREHENSIVE METABOLIC PANEL - Abnormal; Notable for the following components:      Result Value   Potassium 3.3 (*)    Glucose, Bld 229 (*)    All other components within normal limits  ETHANOL  CBC  RAPID URINE DRUG SCREEN, HOSP PERFORMED  I-STAT BETA HCG BLOOD, ED ([facility], [facility], [facility] ONLY)    EKG None  Radiology No results found.  Procedures Procedures (including critical care time)  Medications Ordered in ED Medications  hydrOXYzine (ATARAX/VISTARIL) tablet 50 mg (has no  administration in time range)     Initial Impression / Assessment and Plan / ED Course  I have reviewed the triage vital signs and the nursing notes.  Pertinent labs & imaging results that were available during my care of the patient were reviewed by me and considered in my medical decision making (see chart for details).     Patient presents with increasing depressive symptoms and anxiety.  She is currently off medications.  She appears to have insight.  She denies any SI, HI, hallucinations.  Does not appear acutely psychotic.  I discussed at length with the patient that she needs to follow-up closely with psychiatry.  I will give her some Vistaril this week to see if that helps with her symptoms.  She was instructed that if she has worsening symptoms, develops HI, SI, or any new or worsening symptoms she should be reevaluated.  Patient is able to contract for safety.  She will also be provided with community resources.  After history, exam, and medical workup I feel the patient has been appropriately medically screened and is safe for discharge home. Pertinent diagnoses were discussed with the patient. Patient was given return precautions.   Final Clinical Impressions(s) / ED Diagnoses   Final diagnoses:  Bipolar  depression [facility])    ED Discharge Orders    None       Merryl Hacker, MD 09/13/18 2328

## 2018-09-13 NOTE — Discharge Instructions (Addendum)
You were seen today for feelings of increasing anxiety and depression.  Follow-up closely with your psychiatrist as scheduled next Monday.  You will be started on Vistaril.  If you have any thoughts of wanting to [place] yourself or anyone else she should be reevaluated.

## 2018-09-13 NOTE — ED Triage Notes (Signed)
Patient here with anxiety and depression.  She denies SI/HI at this time.  She is not on any meds at this time per patient.

## 2018-10-05 ENCOUNTER — Other Ambulatory Visit: Payer: Self-pay

## 2018-10-05 ENCOUNTER — Emergency Department (HOSPITAL_COMMUNITY)
Admission: EM | Admit: 2018-10-05 | Discharge: 2018-10-05 | Disposition: A | Discharge status: Home / Self Care | Payer: Medicaid Other | Attending: Emergency Medicine | Admitting: Emergency Medicine

## 2018-10-05 ENCOUNTER — Encounter (HOSPITAL_COMMUNITY): Payer: Self-pay | Admitting: Emergency Medicine

## 2018-10-05 DIAGNOSIS — R0981 Nasal congestion: Secondary | ICD-10-CM | POA: Insufficient documentation

## 2018-10-05 DIAGNOSIS — E119 Type 2 diabetes mellitus without complications: Secondary | ICD-10-CM | POA: Insufficient documentation

## 2018-10-05 DIAGNOSIS — I10 Essential (primary) hypertension: Secondary | ICD-10-CM | POA: Diagnosis not present

## 2018-10-05 DIAGNOSIS — F17210 Nicotine dependence, cigarettes, uncomplicated: Secondary | ICD-10-CM | POA: Diagnosis not present

## 2018-10-05 DIAGNOSIS — Z79899 Other long term (current) drug therapy: Secondary | ICD-10-CM | POA: Diagnosis not present

## 2018-10-05 DIAGNOSIS — Z794 Long term (current) use of insulin: Secondary | ICD-10-CM | POA: Diagnosis not present

## 2018-10-05 DIAGNOSIS — Z7982 Long term (current) use of aspirin: Secondary | ICD-10-CM | POA: Diagnosis not present

## 2018-10-05 DIAGNOSIS — R51 Headache: Secondary | ICD-10-CM | POA: Diagnosis present

## 2018-10-05 MED ORDER — AMOXICILLIN 500 MG PO CAPS: 500.0000 mg | ORAL_CAPSULE | Freq: Three times a day (TID) | ORAL | 0 refills | Status: DC

## 2018-10-05 MED ORDER — NAPROXEN 375 MG PO TABS: 375.0000 mg | ORAL_TABLET | Freq: Two times a day (BID) | ORAL | 0 refills | Status: DC

## 2018-10-05 MED ORDER — IBUPROFEN 400 MG PO TABS
600.0000 mg | ORAL_TABLET | Freq: Once | ORAL | Status: AC
  Administered 2018-10-05: 600 mg via ORAL
  Filled 2018-10-05: qty 1

## 2018-10-05 NOTE — ED Triage Notes (Signed)
Pt presents to ED for assessment nasal congestion, headache, facial sensitivity x 1-2 weeks.  Has tried sudafed and tylenol arthritis without relief.

## 2018-10-05 NOTE — ED Provider Notes (Signed)
[place] EMERGENCY DEPARTMENT Provider Note   CSN: [id number] Arrival date & time: 10/05/18  1719     History   Chief Complaint Chief Complaint  Patient presents with  . Nasal Congestion  . Headache    HPI Ellen Avery is a 52 y.o. female.  Patient presents to ED with week long history of maxillary sinus pressure and headache. Patient has been attempting saline nasal spray and flonase. Patient reports nasal congestion, scant drainage. No fevers/chills. Occasional cough and sore throat.  The history is provided by the patient. No language interpreter was used.  Headache   The current episode started more than 1 week ago. The problem has been gradually worsening.    Past Medical History:  Diagnosis Date  . Allergy   . Anxiety and depression   . Arthritis   . Depression   . Diabetes mellitus without complication ([place])   . Fibromyalgia   . GERD (gastroesophageal reflux disease)   . Hypercholesteremia   . Hypertension   . IBS (irritable bowel syndrome)   . Obesity   . Rectal ulcer     Patient Active Problem List   Diagnosis Date Noted  . IDDM (insulin dependent diabetes mellitus) ([place]) 07/08/2018  . HTN (hypertension) 07/08/2018  . HLD (hyperlipidemia) 07/08/2018  . Altered mental status 03/02/2016  . MDD (major depressive disorder), recurrent episode, moderate ([place]) 03/02/2016  . Psychoses [name])     Past Surgical History:  Procedure Laterality Date  . ABDOMINAL HYSTERECTOMY     total hysterectomy     OB History   None      Home Medications    Prior to Admission medications   Medication Sig Start Date End Date Taking? Authorizing Provider  ACCU-CHEK AVIVA PLUS test strip USE TO CHECK BLOOD SUGAR 4 TIMES DAILY 05/25/18   [provider]  aspirin EC 81 MG tablet Take 81 mg by mouth daily.    [provider]  Cholecalciferol (VITAMIN D3) 3000 units TABS Take 3,000 Units by mouth daily.    [provider]    cyclobenzaprine (FLEXERIL) 10 MG tablet Take 1 tablet (10 mg total) by mouth 2 (two) times daily as needed for muscle spasms. 08/30/18   Law, [name], PA-C  esomeprazole (NEXIUM) 40 MG capsule Take one 30 minutes before breakfast Patient not taking: Reported on 08/02/2018 05/06/18   Willia Craze, NP  fluticasone [facility]) 50 MCG/ACT nasal spray Place 1 spray into both nostrils daily. Patient taking differently: Place 1 spray into both nostrils daily as needed for allergies.  06/07/18   Nuala Alpha A, PA-C  fluticasone (FLONASE) 50 MCG/ACT nasal spray Place 1 spray into both nostrils daily. 08/30/18   Law, [name], PA-C  hydrOXYzine (ATARAX/VISTARIL) 25 MG tablet Take 1 tablet (25 mg total) by mouth every 6 (six) hours as needed for anxiety. 09/13/18   [name], [name], MD  insulin glargine (LANTUS) 100 UNIT/ML injection Inject 15 Units into the skin at bedtime.     [provider]  lidocaine (LIDODERM) 5 % Place 1 patch onto the skin daily. Remove & Discard patch within 12 hours or as directed by MD 08/30/18   Frederica Kuster, PA-C  linaclotide [facility]) 145 MCG CAPS capsule Take 1 capsule (145 mcg total) by mouth daily before breakfast. 06/24/18   Willia Craze, NP  losartan (COZAAR) 25 MG tablet Take 25 mg by mouth daily.    [provider]  metFORMIN (GLUCOPHAGE) 1000 MG  tablet Take 1,000 mg by mouth daily with breakfast.    [provider]  naproxen (NAPROSYN) 500 MG tablet Take 1 tablet (500 mg total) by mouth 2 (two) times daily. 08/30/18   Law, [name], PA-C  simvastatin (ZOCOR) 40 MG tablet Take 40 mg by mouth every evening. Reported on 03/01/2016    [provider]  sodium chloride (OCEAN) 0.65 % SOLN nasal spray Place 1 spray into both nostrils as needed for congestion. 08/30/18   Frederica Kuster, PA-C    Family History Family History  Problem Relation Age of Onset  . Diabetes Mother   . Heart disease Paternal Grandmother   .  Diabetes Paternal Uncle   . Colon cancer Neg Hx   . Colon polyps Neg Hx   . Esophageal cancer Neg Hx   . Rectal cancer Neg Hx   . Stomach cancer Neg Hx     Social History Social History   Tobacco Use  . Smoking status: Current Every Day Smoker    Packs/day: 1.00    Types: Cigarettes  . Smokeless tobacco: Never Used  Substance Use Topics  . Alcohol use: No  . Drug use: No     Allergies   Flagyl [metronidazole]   Review of Systems Review of Systems  HENT: Positive for congestion, sinus pressure and sore throat.   Respiratory: Positive for cough.   Neurological: Positive for headaches.  All other systems reviewed and are negative.    Physical Exam Updated Vital Signs BP 134/90 (BP Location: Right Arm)   Pulse 83   Temp 98.7 F (37.1 C) (Oral)   Resp 18   Ht 5\' 7"  (1.702 m)   Wt 99.8 kg   SpO2 99%   BMI 34.46 kg/m   Physical Exam  Constitutional: She is oriented to person, place, and time. She appears well-developed and well-nourished.  HENT:  Head: Normocephalic.  Nose: Mucosal edema present. Right sinus exhibits maxillary sinus tenderness. Left sinus exhibits maxillary sinus tenderness.  Eyes: EOM are normal.  Neck: Neck supple.  Cardiovascular: Normal rate and regular rhythm.  Pulmonary/Chest: Effort normal and breath sounds normal.  Abdominal: Soft. Bowel sounds are normal.  Musculoskeletal: Normal range of motion.  Neurological: She is alert and oriented to person, place, and time.  Skin: Skin is warm and dry.  Psychiatric: She has a normal mood and affect.  Nursing note and vitals reviewed.    ED Treatments / Results  Labs (all labs ordered are listed, but only abnormal results are displayed) Labs Reviewed - No data to display  EKG None  Radiology No results found.  Procedures Procedures (including critical care time)  Medications Ordered in ED Medications - No data to display   Initial Impression / Assessment and Plan / ED Course    I have reviewed the triage vital signs and the nursing notes.  Pertinent labs & imaging results that were available during my care of the patient were reviewed by me and considered in my medical decision making (see chart for details).     Pt symptoms consistent with URI and sinusitis.  Pt will be discharged with symptomatic treatment.  Discussed return precautions.  Pt is hemodynamically stable & in NAD prior to discharge.  Final Clinical Impressions(s) / ED Diagnoses   Final diagnoses:  Nasal congestion    ED Discharge Orders         Ordered    naproxen (NAPROSYN) 375 MG tablet  2 times daily  10/05/18 2013    amoxicillin (AMOXIL) 500 MG capsule  3 times daily     10/05/18 2014           Etta Quill, NP 10/05/18 [place], Adam, DO 10/06/18 0030

## 2018-10-21 ENCOUNTER — Ambulatory Visit: Payer: Self-pay | Admitting: Dietician

## 2018-10-26 ENCOUNTER — Emergency Department (HOSPITAL_COMMUNITY)
Admission: EM | Admit: 2018-10-26 | Discharge: 2018-10-26 | Disposition: A | Discharge status: Home / Self Care | Payer: Medicaid Other | Attending: Emergency Medicine | Admitting: Emergency Medicine

## 2018-10-26 ENCOUNTER — Encounter (HOSPITAL_COMMUNITY): Payer: Self-pay

## 2018-10-26 DIAGNOSIS — R112 Nausea with vomiting, unspecified: Secondary | ICD-10-CM | POA: Diagnosis not present

## 2018-10-26 DIAGNOSIS — E119 Type 2 diabetes mellitus without complications: Secondary | ICD-10-CM | POA: Insufficient documentation

## 2018-10-26 DIAGNOSIS — F17210 Nicotine dependence, cigarettes, uncomplicated: Secondary | ICD-10-CM | POA: Diagnosis not present

## 2018-10-26 DIAGNOSIS — Z794 Long term (current) use of insulin: Secondary | ICD-10-CM | POA: Diagnosis not present

## 2018-10-26 DIAGNOSIS — Z79899 Other long term (current) drug therapy: Secondary | ICD-10-CM | POA: Diagnosis not present

## 2018-10-26 DIAGNOSIS — I10 Essential (primary) hypertension: Secondary | ICD-10-CM | POA: Insufficient documentation

## 2018-10-26 DIAGNOSIS — Z7982 Long term (current) use of aspirin: Secondary | ICD-10-CM | POA: Diagnosis not present

## 2018-10-26 LAB — CBC WITH DIFFERENTIAL/PLATELET
ABS IMMATURE GRANULOCYTES: 0.02 10*3/uL (ref 0.00–0.07)
BASOS ABS: 0.1 10*3/uL (ref 0.0–0.1)
BASOS PCT: 1 %
Eosinophils Absolute: 0.1 10*3/uL (ref 0.0–0.5)
Eosinophils Relative: 1 %
HCT: 38.6 % (ref 36.0–46.0)
HEMOGLOBIN: 11.8 g/dL — AB (ref 12.0–15.0)
IMMATURE GRANULOCYTES: 0 %
LYMPHS PCT: 46 %
Lymphs Abs: 4.2 10*3/uL — ABNORMAL HIGH (ref 0.7–4.0)
MCH: 26.8 pg (ref 26.0–34.0)
MCHC: 30.6 g/dL (ref 30.0–36.0)
MCV: 87.5 fL (ref 80.0–100.0)
Monocytes Absolute: 0.6 10*3/uL (ref 0.1–1.0)
Monocytes Relative: 6 %
NEUTROS PCT: 46 %
NRBC: 0 % (ref 0.0–0.2)
Neutro Abs: 4.3 10*3/uL (ref 1.7–7.7)
PLATELETS: 321 10*3/uL (ref 150–400)
RBC: 4.41 MIL/uL (ref 3.87–5.11)
RDW: 12.3 % (ref 11.5–15.5)
WBC: 9.3 10*3/uL (ref 4.0–10.5)

## 2018-10-26 LAB — COMPREHENSIVE METABOLIC PANEL
ALT: 13 U/L (ref 0–44)
AST: 13 U/L — ABNORMAL LOW (ref 15–41)
Albumin: 3.2 g/dL — ABNORMAL LOW (ref 3.5–5.0)
Alkaline Phosphatase: 70 U/L (ref 38–126)
Anion gap: 7 (ref 5–15)
BUN: 6 mg/dL (ref 6–20)
CO2: 29 mmol/L (ref 22–32)
Calcium: 9.3 mg/dL (ref 8.9–10.3)
Chloride: 105 mmol/L (ref 98–111)
Creatinine, Ser: 0.72 mg/dL (ref 0.44–1.00)
Glucose, Bld: 145 mg/dL — ABNORMAL HIGH (ref 70–99)
POTASSIUM: 3.4 mmol/L — AB (ref 3.5–5.1)
SODIUM: 141 mmol/L (ref 135–145)
Total Bilirubin: 0.3 mg/dL (ref 0.3–1.2)
Total Protein: 6.2 g/dL — ABNORMAL LOW (ref 6.5–8.1)

## 2018-10-26 LAB — LIPASE, BLOOD: LIPASE: 32 U/L (ref 11–51)

## 2018-10-26 MED ORDER — LIDOCAINE VISCOUS HCL 2 % MT SOLN
15.0000 mL | Freq: Once | OROMUCOSAL | Status: AC
  Administered 2018-10-26: 15 mL via ORAL
  Filled 2018-10-26: qty 15

## 2018-10-26 MED ORDER — ONDANSETRON HCL 4 MG PO TABS: 4.0000 mg | ORAL_TABLET | Freq: Four times a day (QID) | ORAL | 0 refills | Status: AC

## 2018-10-26 MED ORDER — SODIUM CHLORIDE 0.9 % IV BOLUS
1000.0000 mL | Freq: Once | INTRAVENOUS | Status: AC
  Administered 2018-10-26: 1000 mL via INTRAVENOUS

## 2018-10-26 MED ORDER — ALUM & MAG HYDROXIDE-SIMETH 200-200-20 MG/5ML PO SUSP
30.0000 mL | Freq: Once | ORAL | Status: AC
  Administered 2018-10-26: 30 mL via ORAL
  Filled 2018-10-26: qty 30

## 2018-10-26 MED ORDER — ONDANSETRON HCL 4 MG/2ML IJ SOLN
4.0000 mg | Freq: Once | INTRAMUSCULAR | Status: AC
  Administered 2018-10-26: 4 mg via INTRAVENOUS
  Filled 2018-10-26: qty 2

## 2018-10-26 NOTE — ED Notes (Signed)
Pt stable, ambulatory, states understanding of discharge instructions 

## 2018-10-26 NOTE — ED Triage Notes (Signed)
When asked to provide pain scale number, pt states her stomach isn't actually hurting but she does have congestion and maybe the beginning of a stomach virus

## 2018-10-26 NOTE — ED Triage Notes (Signed)
Pt c/o central abdominal pain and nausea since yesterday.

## 2018-10-26 NOTE — ED Notes (Signed)
Labs re drawn and sent

## 2018-10-26 NOTE — Discharge Instructions (Addendum)
I have prescribed Zofran to help with your nausea, please take as need.Continue to hydrate with fluids and gatorade. If you experience any shortness of breath, fever or worsening symptoms return to the ED for reevaluation.

## 2018-10-26 NOTE — ED Provider Notes (Signed)
  Physical Exam  BP 112/76 (BP Location: Right Arm)   Pulse 74   Temp 98.4 F (36.9 C) (Oral)   Resp 16   SpO2 100%   Physical Exam  ED Course/Procedures     Procedures  MDM  Patient seen from Dr. Verner Chol at shift change, please see his note for a full HPI.  In summary patient presents with nausea and vomiting, reports she is been around some sick contacts recently.  Labs pending at discharge.  Labs are unremarkable no leukocytosis, elevation of LFTs or other abnormality patient states she feels much better after symptomatic control but is up for going home.  I have provided patient some Zofran to take at home with her nausea.  Patient's vitals able, patient stable for discharge.   Janeece Fitting, PA-C 10/26/18 2034    Sherwood Gambler, MD 11/03/18 [phone]

## 2018-10-26 NOTE — ED Notes (Signed)
Spoke with Berton Lan in main lab inquiring about lab results pending for patient.  Lab states they don't have the blood.  This RN asked for them to look again.  Will call back to check.

## 2018-10-26 NOTE — ED Provider Notes (Signed)
[place] EMERGENCY DEPARTMENT Provider Note   CSN: [id number] Arrival date & time: 10/26/18  1724     History   Chief Complaint Chief Complaint  Patient presents with  . Abdominal Pain  . Nausea    HPI Ellen Avery is a 52 y.o. female.  HPI  52 year old female presents with a chief complaint of nausea.  Started yesterday along with a couple diarrheal bowel movements.  No blood.  She has some discomfort in her abdomen but not really abdominal pain.  This is in the left upper quadrant.  There is no chest pain or shortness of breath.  There is no vomiting but nausea does worsen with eating.  The patient's states multiple people in choir over the past weekend had similar GI illnesses and she is worried she has one.  No fevers noted.  Past Medical History:  Diagnosis Date  . Allergy   . Anxiety and depression   . Arthritis   . Depression   . Diabetes mellitus without complication ([place])   . Fibromyalgia   . GERD (gastroesophageal reflux disease)   . Hypercholesteremia   . Hypertension   . IBS (irritable bowel syndrome)   . Obesity   . Rectal ulcer     Patient Active Problem List   Diagnosis Date Noted  . IDDM (insulin dependent diabetes mellitus) ([place]) 07/08/2018  . HTN (hypertension) 07/08/2018  . HLD (hyperlipidemia) 07/08/2018  . Altered mental status 03/02/2016  . MDD (major depressive disorder), recurrent episode, moderate ([place]) 03/02/2016  . Psychoses [facility])     Past Surgical History:  Procedure Laterality Date  . ABDOMINAL HYSTERECTOMY     total hysterectomy     OB History   None      Home Medications    Prior to Admission medications   Medication Sig Start Date End Date Taking? Authorizing Provider  ACCU-CHEK AVIVA PLUS test strip USE TO CHECK BLOOD SUGAR 4 TIMES DAILY 05/25/18   [provider]  amoxicillin (AMOXIL) 500 MG capsule Take 1 capsule (500 mg total) by mouth 3 (three) times daily. 10/05/18   Etta Quill,  NP  aspirin EC 81 MG tablet Take 81 mg by mouth daily.    [provider]  Cholecalciferol (VITAMIN D3) 3000 units TABS Take 3,000 Units by mouth daily.    [provider]  cyclobenzaprine (FLEXERIL) 10 MG tablet Take 1 tablet (10 mg total) by mouth 2 (two) times daily as needed for muscle spasms. 08/30/18   Law, [name], PA-C  esomeprazole (NEXIUM) 40 MG capsule Take one 30 minutes before breakfast Patient not taking: Reported on 08/02/2018 05/06/18   Willia Craze, NP  fluticasone [facility]) 50 MCG/ACT nasal spray Place 1 spray into both nostrils daily. Patient taking differently: Place 1 spray into both nostrils daily as needed for allergies.  06/07/18   Nuala Alpha A, PA-C  fluticasone (FLONASE) 50 MCG/ACT nasal spray Place 1 spray into both nostrils daily. 08/30/18   Law, [name], PA-C  hydrOXYzine (ATARAX/VISTARIL) 25 MG tablet Take 1 tablet (25 mg total) by mouth every 6 (six) hours as needed for anxiety. 09/13/18   [name], [name], MD  insulin glargine (LANTUS) 100 UNIT/ML injection Inject 15 Units into the skin at bedtime.     [provider]  lidocaine (LIDODERM) 5 % Place 1 patch onto the skin daily. Remove & Discard patch within 12 hours or as directed by MD 08/30/18   Frederica Kuster, PA-C  linaclotide (  LINZESS) 145 MCG CAPS capsule Take 1 capsule (145 mcg total) by mouth daily before breakfast. 06/24/18   Willia Craze, NP  losartan (COZAAR) 25 MG tablet Take 25 mg by mouth daily.    [provider]  metFORMIN (GLUCOPHAGE) 1000 MG tablet Take 1,000 mg by mouth daily with breakfast.    [provider]  naproxen (NAPROSYN) 375 MG tablet Take 1 tablet (375 mg total) by mouth 2 (two) times daily. 10/05/18   Etta Quill, NP  naproxen (NAPROSYN) 500 MG tablet Take 1 tablet (500 mg total) by mouth 2 (two) times daily. 08/30/18   Law, [name], PA-C  simvastatin (ZOCOR) 40 MG tablet Take 40 mg by mouth every evening. Reported on  03/01/2016    [provider]  sodium chloride (OCEAN) 0.65 % SOLN nasal spray Place 1 spray into both nostrils as needed for congestion. 08/30/18   Frederica Kuster, PA-C    Family History Family History  Problem Relation Age of Onset  . Diabetes Mother   . Heart disease Paternal Grandmother   . Diabetes Paternal Uncle   . Colon cancer Neg Hx   . Colon polyps Neg Hx   . Esophageal cancer Neg Hx   . Rectal cancer Neg Hx   . Stomach cancer Neg Hx     Social History Social History   Tobacco Use  . Smoking status: Current Every Day Smoker    Packs/day: 1.00    Types: Cigarettes  . Smokeless tobacco: Never Used  Substance Use Topics  . Alcohol use: No  . Drug use: No     Allergies   Flagyl [metronidazole]   Review of Systems Review of Systems  Constitutional: Negative for fever.  Respiratory: Negative for shortness of breath.   Cardiovascular: Negative for chest pain.  Gastrointestinal: Positive for diarrhea and nausea. Negative for abdominal pain, blood in stool and vomiting.  Genitourinary: Negative for dysuria.  Musculoskeletal: Negative for back pain.  All other systems reviewed and are negative.    Physical Exam Updated Vital Signs BP 119/83 (BP Location: Right Arm)   Pulse 84   Temp 98.9 F (37.2 C) (Oral)   Resp 16   SpO2 98%   Physical Exam  Constitutional: She appears well-developed and well-nourished.  Non-toxic appearance. She does not appear ill. No distress.  HENT:  Head: Normocephalic and atraumatic.  Right Ear: External ear normal.  Left Ear: External ear normal.  Nose: Nose normal.  Eyes: Right eye exhibits no discharge. Left eye exhibits no discharge.  Cardiovascular: Normal rate, regular rhythm and normal heart sounds.  Pulmonary/Chest: Effort normal and breath sounds normal.  Abdominal: Soft. There is tenderness (mild) in the left upper quadrant. There is no CVA tenderness.  Neurological: She is alert.  Skin: Skin is warm and  dry.  Psychiatric: Her mood appears not anxious.  Nursing note and vitals reviewed.    ED Treatments / Results  Labs (all labs ordered are listed, but only abnormal results are displayed) Labs Reviewed  COMPREHENSIVE METABOLIC PANEL  LIPASE, BLOOD  CBC WITH DIFFERENTIAL/PLATELET    EKG None  Radiology No results found.  Procedures Procedures (including critical care time)  Medications Ordered in ED Medications  alum & mag hydroxide-simeth (MAALOX/MYLANTA) 200-200-20 MG/5ML suspension 30 mL (has no administration in time range)    And  lidocaine (XYLOCAINE) 2 % viscous mouth solution 15 mL (has no administration in time range)  sodium chloride 0.9 % bolus 1,000 mL (1,000 mLs Intravenous  New Bag/Given 10/26/18 1754)  ondansetron (ZOFRAN) injection 4 mg (4 mg Intravenous Given 10/26/18 1754)     Initial Impression / Assessment and Plan / ED Course  I have reviewed the triage vital signs and the nursing notes.  Pertinent labs & imaging results that were available during my care of the patient were reviewed by me and considered in my medical decision making (see chart for details).     Patient will be given fluids and Zofran and GI cocktail.  I think this is likely a GI illness such as viral gastroenteritis.  However given her age and diabetes I will also add on an ECG, but her abdominal exam is benign and I have low suspicion of ACS or acute intra-abdominal emergency.  Her labs are currently pending.  These labs showed significant abnormalities, she may need imaging. Care to [facility].   Final Clinical Impressions(s) / ED Diagnoses   Final diagnoses:  None    ED Discharge Orders    None       Sherwood Gambler, MD 10/26/18 1910

## 2018-12-07 ENCOUNTER — Encounter (HOSPITAL_COMMUNITY): Payer: Self-pay | Admitting: Emergency Medicine

## 2018-12-07 ENCOUNTER — Emergency Department (HOSPITAL_COMMUNITY): Payer: Medicaid Other

## 2018-12-07 ENCOUNTER — Other Ambulatory Visit: Payer: Self-pay

## 2018-12-07 ENCOUNTER — Emergency Department (HOSPITAL_COMMUNITY)
Admission: EM | Admit: 2018-12-07 | Discharge: 2018-12-07 | Disposition: A | Discharge status: Home / Self Care | Payer: Medicaid Other | Attending: Emergency Medicine | Admitting: Emergency Medicine

## 2018-12-07 DIAGNOSIS — E785 Hyperlipidemia, unspecified: Secondary | ICD-10-CM | POA: Diagnosis not present

## 2018-12-07 DIAGNOSIS — I10 Essential (primary) hypertension: Secondary | ICD-10-CM | POA: Insufficient documentation

## 2018-12-07 DIAGNOSIS — Z7982 Long term (current) use of aspirin: Secondary | ICD-10-CM | POA: Diagnosis not present

## 2018-12-07 DIAGNOSIS — F17210 Nicotine dependence, cigarettes, uncomplicated: Secondary | ICD-10-CM | POA: Diagnosis not present

## 2018-12-07 DIAGNOSIS — R197 Diarrhea, unspecified: Secondary | ICD-10-CM

## 2018-12-07 DIAGNOSIS — E119 Type 2 diabetes mellitus without complications: Secondary | ICD-10-CM | POA: Insufficient documentation

## 2018-12-07 DIAGNOSIS — E7800 Pure hypercholesterolemia, unspecified: Secondary | ICD-10-CM | POA: Insufficient documentation

## 2018-12-07 DIAGNOSIS — J069 Acute upper respiratory infection, unspecified: Secondary | ICD-10-CM | POA: Diagnosis not present

## 2018-12-07 DIAGNOSIS — Z79899 Other long term (current) drug therapy: Secondary | ICD-10-CM | POA: Insufficient documentation

## 2018-12-07 DIAGNOSIS — Z794 Long term (current) use of insulin: Secondary | ICD-10-CM | POA: Insufficient documentation

## 2018-12-07 DIAGNOSIS — R0981 Nasal congestion: Secondary | ICD-10-CM | POA: Diagnosis present

## 2018-12-07 LAB — COMPREHENSIVE METABOLIC PANEL
ALBUMIN: 3.8 g/dL (ref 3.5–5.0)
ALT: 20 U/L (ref 0–44)
AST: 15 U/L (ref 15–41)
Alkaline Phosphatase: 79 U/L (ref 38–126)
Anion gap: 11 (ref 5–15)
BILIRUBIN TOTAL: 0.3 mg/dL (ref 0.3–1.2)
BUN: 8 mg/dL (ref 6–20)
CHLORIDE: 101 mmol/L (ref 98–111)
CO2: 27 mmol/L (ref 22–32)
CREATININE: 0.83 mg/dL (ref 0.44–1.00)
Calcium: 10 mg/dL (ref 8.9–10.3)
GFR calc Af Amer: >60 mL/min (ref >60)
GFR calc non Af Amer: >60 mL/min (ref >60)
Glucose, Bld: 228 mg/dL — ABNORMAL HIGH (ref 70–99)
POTASSIUM: 4.5 mmol/L (ref 3.5–5.1)
Sodium: 139 mmol/L (ref 135–145)
Total Protein: 7.3 g/dL (ref 6.5–8.1)

## 2018-12-07 LAB — CBC WITH DIFFERENTIAL/PLATELET
ABS IMMATURE GRANULOCYTES: 0.02 10*3/uL (ref 0.00–0.07)
BASOS ABS: 0.1 10*3/uL (ref 0.0–0.1)
Basophils Relative: 1 %
EOS ABS: 0.1 10*3/uL (ref 0.0–0.5)
Eosinophils Relative: 1 %
HEMATOCRIT: 44 % (ref 36.0–46.0)
Hemoglobin: 13.8 g/dL (ref 12.0–15.0)
Immature Granulocytes: 0 %
LYMPHS ABS: 4 10*3/uL (ref 0.7–4.0)
Lymphocytes Relative: 40 %
MCH: 27.2 pg (ref 26.0–34.0)
MCHC: 31.4 g/dL (ref 30.0–36.0)
MCV: 86.6 fL (ref 80.0–100.0)
MONOS PCT: 5 %
Monocytes Absolute: 0.5 10*3/uL (ref 0.1–1.0)
NEUTROS PCT: 53 %
NRBC: 0 % (ref 0.0–0.2)
Neutro Abs: 5.4 10*3/uL (ref 1.7–7.7)
PLATELETS: 341 10*3/uL (ref 150–400)
RBC: 5.08 MIL/uL (ref 3.87–5.11)
RDW: 12.2 % (ref 11.5–15.5)
WBC: 10.1 10*3/uL (ref 4.0–10.5)

## 2018-12-07 LAB — I-STAT TROPONIN, ED: Troponin i, poc: 0 ng/mL (ref 0.00–0.08)

## 2018-12-07 MED ORDER — GUAIFENESIN ER 600 MG PO TB12: 600.0000 mg | ORAL_TABLET | Freq: Two times a day (BID) | ORAL | 0 refills | Status: DC

## 2018-12-07 MED ORDER — BENZONATATE 100 MG PO CAPS: 100.0000 mg | ORAL_CAPSULE | Freq: Three times a day (TID) | ORAL | 0 refills | Status: DC

## 2018-12-07 MED ORDER — FLUTICASONE PROPIONATE 50 MCG/ACT NA SUSP: 2.0000 | Freq: Every day | NASAL | 0 refills | Status: DC

## 2018-12-07 NOTE — ED Triage Notes (Signed)
Pt. Stated, Donnald Garre had a lot of congestion in my chest that makes me SOB and I started having some diarrhea.

## 2018-12-07 NOTE — ED Provider Notes (Signed)
[facility] EMERGENCY DEPARTMENT Provider Note   CSN: [id number] Arrival date & time: 12/07/18  1044     History   Chief Complaint Chief Complaint  Patient presents with  . Nasal Congestion  . Shortness of Breath  . Diarrhea    HPI Ellen Avery is a 52 y.o. female.  HPI   Patient is a 52 year old female with a history of anxiety/depression, diabetes, fibromyalgia, GERD, hyperlipidemia, hyper tension, IBS, who presents to the emergency department today for evaluation of nasal congestion, rhinorrhea, cough that began about a week ago.  Cough is mostly nonproductive though she does have some sputum production and intermittently.  She feels somewhat fatigued and short of breath when she is walking.  States she has burning sensation in her chest that she feels like is consistent with her GERD.  She is also complaining of diarrhea.  No bloody stools.  No abdominal pain, nausea, vomiting or urinary symptoms.  No fevers or chills.  Past Medical History:  Diagnosis Date  . Allergy   . Anxiety and depression   . Arthritis   . Depression   . Diabetes mellitus without complication ([place])   . Fibromyalgia   . GERD (gastroesophageal reflux disease)   . Hypercholesteremia   . Hypertension   . IBS (irritable bowel syndrome)   . Obesity   . Rectal ulcer     Patient Active Problem List   Diagnosis Date Noted  . IDDM (insulin dependent diabetes mellitus) ([place]) 07/08/2018  . HTN (hypertension) 07/08/2018  . HLD (hyperlipidemia) 07/08/2018  . Altered mental status 03/02/2016  . MDD (major depressive disorder), recurrent episode, moderate ([place]) 03/02/2016  . Psychoses [facility])     Past Surgical History:  Procedure Laterality Date  . ABDOMINAL HYSTERECTOMY     total hysterectomy     OB History   No obstetric history on file.      Home Medications    Prior to Admission medications   Medication Sig Start Date End Date Taking? Authorizing Provider  ACCU-CHEK  AVIVA PLUS test strip USE TO CHECK BLOOD SUGAR 4 TIMES DAILY 05/25/18   [provider]  amoxicillin (AMOXIL) 500 MG capsule Take 1 capsule (500 mg total) by mouth 3 (three) times daily. 10/05/18   Etta Quill, NP  aspirin EC 81 MG tablet Take 81 mg by mouth daily.    [provider]  benzonatate (TESSALON) 100 MG capsule Take 1 capsule (100 mg total) by mouth every 8 (eight) hours. 12/07/18   Jayanth Szczesniak S, PA-C  Cholecalciferol (VITAMIN D3) 3000 units TABS Take 3,000 Units by mouth daily.    [provider]  cyclobenzaprine (FLEXERIL) 10 MG tablet Take 1 tablet (10 mg total) by mouth 2 (two) times daily as needed for muscle spasms. 08/30/18   Law, [name], PA-C  esomeprazole (NEXIUM) 40 MG capsule Take one 30 minutes before breakfast Patient not taking: Reported on 08/02/2018 05/06/18   Willia Craze, NP  fluticasone [facility]) 50 MCG/ACT nasal spray Place 2 sprays into both nostrils daily. 12/07/18   Aymen Widrig S, PA-C  guaiFENesin (MUCINEX) 600 MG 12 hr tablet Take 1 tablet (600 mg total) by mouth 2 (two) times daily. 12/07/18   Tydarius Yawn S, PA-C  hydrOXYzine (ATARAX/VISTARIL) 25 MG tablet Take 1 tablet (25 mg total) by mouth every 6 (six) hours as needed for anxiety. 09/13/18   [name], [name], MD  insulin glargine (LANTUS) 100 UNIT/ML injection Inject 15 Units into the skin  at bedtime.     [provider]  lidocaine (LIDODERM) 5 % Place 1 patch onto the skin daily. Remove & Discard patch within 12 hours or as directed by MD 08/30/18   Frederica Kuster, PA-C  linaclotide [facility]) 145 MCG CAPS capsule Take 1 capsule (145 mcg total) by mouth daily before breakfast. 06/24/18   Willia Craze, NP  losartan (COZAAR) 25 MG tablet Take 25 mg by mouth daily.    [provider]  metFORMIN (GLUCOPHAGE) 1000 MG tablet Take 1,000 mg by mouth daily with breakfast.    [provider]  naproxen (NAPROSYN) 375 MG tablet Take 1 tablet  (375 mg total) by mouth 2 (two) times daily. 10/05/18   Etta Quill, NP  naproxen (NAPROSYN) 500 MG tablet Take 1 tablet (500 mg total) by mouth 2 (two) times daily. 08/30/18   Law, [name], PA-C  simvastatin (ZOCOR) 40 MG tablet Take 40 mg by mouth every evening. Reported on 03/01/2016    [provider]  sodium chloride (OCEAN) 0.65 % SOLN nasal spray Place 1 spray into both nostrils as needed for congestion. 08/30/18   Frederica Kuster, PA-C    Family History Family History  Problem Relation Age of Onset  . Diabetes Mother   . Heart disease Paternal Grandmother   . Diabetes Paternal Uncle   . Colon cancer Neg Hx   . Colon polyps Neg Hx   . Esophageal cancer Neg Hx   . Rectal cancer Neg Hx   . Stomach cancer Neg Hx     Social History Social History   Tobacco Use  . Smoking status: Current Every Day Smoker    Packs/day: 1.00    Types: Cigarettes  . Smokeless tobacco: Never Used  Substance Use Topics  . Alcohol use: No  . Drug use: No     Allergies   Flagyl [metronidazole]   Review of Systems Review of Systems  Constitutional: Negative for chills and fever.  HENT: Positive for congestion, rhinorrhea and sore throat.   Eyes: Negative for visual disturbance.  Respiratory: Positive for cough and shortness of breath.   Cardiovascular: Negative for chest pain and leg swelling.  Gastrointestinal: Positive for diarrhea. Negative for abdominal pain, constipation, nausea and vomiting.  Genitourinary: Negative for dysuria.  Musculoskeletal: Negative for back pain.  Skin: Negative for rash.  Neurological: Negative for headaches.     Physical Exam Updated Vital Signs BP 115/87 (BP Location: Right Arm)   Pulse 93   Temp 98.7 F (37.1 C) (Oral)   Resp 16   Ht 5\' 7"  (1.702 m)   Wt 90.7 kg   SpO2 99%   BMI 31.32 kg/m   Physical Exam Vitals signs and nursing note reviewed.  Constitutional:      General: She is not in acute distress.    Appearance: She is  well-developed. She is not ill-appearing or toxic-appearing.  HENT:     Head: Normocephalic and atraumatic.     Mouth/Throat:     Mouth: Mucous membranes are moist.     Pharynx: No pharyngeal swelling or oropharyngeal exudate.  Eyes:     Extraocular Movements: Extraocular movements intact.     Conjunctiva/sclera: Conjunctivae normal.     Pupils: Pupils are equal, round, and reactive to light.  Neck:     Musculoskeletal: Neck supple.  Cardiovascular:     Rate and Rhythm: Normal rate and regular rhythm.     Heart sounds: No murmur.  Pulmonary:  Effort: Pulmonary effort is normal. No respiratory distress.     Breath sounds: Normal breath sounds. No decreased breath sounds, wheezing, rhonchi or rales.  Abdominal:     General: Bowel sounds are normal.     Palpations: Abdomen is soft.     Tenderness: There is no abdominal tenderness. There is no guarding or rebound.  Musculoskeletal:     Right lower leg: She exhibits no tenderness.  Lymphadenopathy:     Cervical: No cervical adenopathy.  Skin:    General: Skin is warm and dry.  Neurological:     Mental Status: She is alert.  Psychiatric:        Mood and Affect: Mood normal.      ED Treatments / Results  Labs (all labs ordered are listed, but only abnormal results are displayed) Labs Reviewed  COMPREHENSIVE METABOLIC PANEL - Abnormal; Notable for the following components:      Result Value   Glucose, Bld 228 (*)    All other components within normal limits  CBC WITH DIFFERENTIAL/PLATELET  I-STAT TROPONIN, ED    EKG EKG Interpretation  Date/Time:  Tuesday December 07 2018 12:27:11 EST Ventricular Rate:  96 PR Interval:  132 QRS Duration: 78 QT Interval:  334 QTC Calculation: 421 R Axis:   75 Text Interpretation:  Normal sinus rhythm Nonspecific T wave abnormality Abnormal ECG Confirmed by Malvin Johns [phone]) on 12/07/2018 12:58:33 PM   Radiology Dg Chest 2 View  Result Date: 12/07/2018 CLINICAL DATA:   52 year old female with a history of cough EXAM: CHEST - 2 VIEW COMPARISON:  08/02/2018, 03/01/2016 FINDINGS: Cardiomediastinal silhouette unchanged in size and contour. No pneumothorax or pleural effusion. Similar appearance of coarsened interstitial markings. No confluent airspace disease. Minimal degenerative changes of the spine with no acute displaced fracture. IMPRESSION: Negative for acute cardiopulmonary disease Electronically Signed   By: Corrie Mckusick D.O.   On: 12/07/2018 12:13    Procedures Procedures (including critical care time)  Medications Ordered in ED Medications - No data to display   Initial Impression / Assessment and Plan / ED Course  I have reviewed the triage vital signs and the nursing notes.  Pertinent labs & imaging results that were available during my care of the patient were reviewed by me and considered in my medical decision making (see chart for details).   Final Clinical Impressions(s) / ED Diagnoses   Final diagnoses:  Upper respiratory tract infection, unspecified type  Diarrhea, unspecified type    Patient planing of URI symptoms.  She has clear lungs on exam.  She has no fevers and her vital signs are normal.  Her chest x-ray does not show any evidence of pneumonia.  EKG is normal.  Troponin is negative.  She is complaining of some diarrhea as well for the last week and feels like she is getting somewhat dehydrated.  Electrolytes are within normal limits.  Kidney and liver function are normal.  CBC is without leukocytosis.  Suspect that patient may have a viral illness causing her symptoms.  Have advised her to keep well-hydrated, will give Rx for symptomatic treatment at home.  Advised her to monitor sugars at home because they are somewhat elevated today.  She states she always runs high.  She has no evidence of DKA today.  Vies follow-up with PCP for possible medication adjustment.  Advised her to return the ER for new or worsening symptoms in the  meantime.  She voiced understanding the plan and reasons to return to  the ED.  All questions answered.  ED Discharge Orders         Ordered    fluticasone (FLONASE) 50 MCG/ACT nasal spray  Daily     12/07/18 1404    benzonatate (TESSALON) 100 MG capsule  Every 8 hours     12/07/18 1404    guaiFENesin (MUCINEX) 600 MG 12 hr tablet  2 times daily     12/07/18 [address] [place], PA-C 12/07/18 1405    Margette Fast, MD 12/07/18 [phone]

## 2018-12-07 NOTE — ED Triage Notes (Signed)
EMS stated, she is here because of chest pain, SOB and some diarrhea for the last 4 days. It comes and goes. The pain and SOB is with exertion .

## 2018-12-07 NOTE — ED Notes (Signed)
ED Provider at bedside. 

## 2018-12-07 NOTE — Discharge Instructions (Signed)
Take medications as directed.   Keep well hydrated at home.  Please follow up with your primary care provider within 5-7 days for re-evaluation of your symptoms. If you do not have a primary care provider, information for a healthcare clinic has been provided for you to make arrangements for follow up care. Please return to the emergency department for any new or worsening symptoms.

## 2018-12-07 NOTE — ED Notes (Signed)
ED Provider at bedside.( PA) 

## 2018-12-11 ENCOUNTER — Emergency Department (HOSPITAL_COMMUNITY): Payer: Medicaid Other

## 2018-12-11 ENCOUNTER — Other Ambulatory Visit: Payer: Self-pay

## 2018-12-11 ENCOUNTER — Encounter (HOSPITAL_COMMUNITY): Payer: Self-pay | Admitting: Emergency Medicine

## 2018-12-11 ENCOUNTER — Emergency Department (HOSPITAL_COMMUNITY)
Admission: EM | Admit: 2018-12-11 | Discharge: 2018-12-11 | Discharge status: Against Medical Advice | Payer: Medicaid Other | Attending: Emergency Medicine | Admitting: Emergency Medicine

## 2018-12-11 DIAGNOSIS — Z5321 Procedure and treatment not carried out due to patient leaving prior to being seen by health care provider: Secondary | ICD-10-CM | POA: Diagnosis not present

## 2018-12-11 DIAGNOSIS — R079 Chest pain, unspecified: Secondary | ICD-10-CM | POA: Diagnosis not present

## 2018-12-11 DIAGNOSIS — R110 Nausea: Secondary | ICD-10-CM | POA: Diagnosis not present

## 2018-12-11 DIAGNOSIS — R197 Diarrhea, unspecified: Secondary | ICD-10-CM | POA: Diagnosis not present

## 2018-12-11 DIAGNOSIS — R109 Unspecified abdominal pain: Secondary | ICD-10-CM | POA: Diagnosis not present

## 2018-12-11 LAB — CBC
HCT: 39.8 % (ref 36.0–46.0)
Hemoglobin: 12.7 g/dL (ref 12.0–15.0)
MCH: 27.3 pg (ref 26.0–34.0)
MCHC: 31.9 g/dL (ref 30.0–36.0)
MCV: 85.6 fL (ref 80.0–100.0)
PLATELETS: 324 10*3/uL (ref 150–400)
RBC: 4.65 MIL/uL (ref 3.87–5.11)
RDW: 12.1 % (ref 11.5–15.5)
WBC: 9.2 10*3/uL (ref 4.0–10.5)
nRBC: 0 % (ref 0.0–0.2)

## 2018-12-11 LAB — URINALYSIS, ROUTINE W REFLEX MICROSCOPIC
Bilirubin Urine: NEGATIVE
GLUCOSE, UA: 50 mg/dL — AB
HGB URINE DIPSTICK: NEGATIVE
Ketones, ur: NEGATIVE mg/dL
Leukocytes, UA: NEGATIVE
Nitrite: NEGATIVE
PH: 5 (ref 5.0–8.0)
Protein, ur: NEGATIVE mg/dL
Specific Gravity, Urine: 1.028 (ref 1.005–1.030)

## 2018-12-11 LAB — LIPASE, BLOOD: LIPASE: 34 U/L (ref 11–51)

## 2018-12-11 LAB — COMPREHENSIVE METABOLIC PANEL
ALT: 17 U/L (ref 0–44)
AST: 13 U/L — ABNORMAL LOW (ref 15–41)
Albumin: 3.5 g/dL (ref 3.5–5.0)
Alkaline Phosphatase: 76 U/L (ref 38–126)
Anion gap: 8 (ref 5–15)
BUN: 10 mg/dL (ref 6–20)
CO2: 27 mmol/L (ref 22–32)
Calcium: 9.3 mg/dL (ref 8.9–10.3)
Chloride: 102 mmol/L (ref 98–111)
Creatinine, Ser: 0.79 mg/dL (ref 0.44–1.00)
GFR calc Af Amer: >60 mL/min (ref >60)
Glucose, Bld: 218 mg/dL — ABNORMAL HIGH (ref 70–99)
POTASSIUM: 3.9 mmol/L (ref 3.5–5.1)
SODIUM: 137 mmol/L (ref 135–145)
Total Bilirubin: 0.2 mg/dL — ABNORMAL LOW (ref 0.3–1.2)
Total Protein: 7 g/dL (ref 6.5–8.1)

## 2018-12-11 LAB — I-STAT TROPONIN, ED: TROPONIN I, POC: 0 ng/mL (ref 0.00–0.08)

## 2018-12-11 NOTE — ED Triage Notes (Signed)
C/o lower abd pain, substernal chest pain, acid reflux, nausea, and diarrhea since 12/25.

## 2018-12-11 NOTE — ED Notes (Signed)
Pt. told registration she was leaving due to wait time.

## 2018-12-11 NOTE — ED Notes (Signed)
Pt seen by registration to be leaving the department.

## 2018-12-18 ENCOUNTER — Other Ambulatory Visit: Payer: Self-pay

## 2018-12-18 ENCOUNTER — Encounter (HOSPITAL_COMMUNITY): Payer: Self-pay | Admitting: Emergency Medicine

## 2018-12-18 ENCOUNTER — Ambulatory Visit (HOSPITAL_COMMUNITY)
Admission: EM | Admit: 2018-12-18 | Discharge: 2018-12-18 | Disposition: A | Discharge status: Home / Self Care | Payer: Medicaid Other | Attending: Family Medicine | Admitting: Family Medicine

## 2018-12-18 DIAGNOSIS — G441 Vascular headache, not elsewhere classified: Secondary | ICD-10-CM | POA: Insufficient documentation

## 2018-12-18 DIAGNOSIS — E119 Type 2 diabetes mellitus without complications: Secondary | ICD-10-CM

## 2018-12-18 DIAGNOSIS — M316 Other giant cell arteritis: Secondary | ICD-10-CM | POA: Insufficient documentation

## 2018-12-18 LAB — CBC WITH DIFFERENTIAL/PLATELET
Abs Immature Granulocytes: 0.02 10*3/uL (ref 0.00–0.07)
Basophils Absolute: 0 10*3/uL (ref 0.0–0.1)
Basophils Relative: 1 %
Eosinophils Absolute: 0.1 10*3/uL (ref 0.0–0.5)
Eosinophils Relative: 1 %
HCT: 37.6 % (ref 36.0–46.0)
Hemoglobin: 12 g/dL (ref 12.0–15.0)
Immature Granulocytes: 0 %
Lymphocytes Relative: 41 %
Lymphs Abs: 2.9 10*3/uL (ref 0.7–4.0)
MCH: 27.4 pg (ref 26.0–34.0)
MCHC: 31.9 g/dL (ref 30.0–36.0)
MCV: 85.8 fL (ref 80.0–100.0)
MONO ABS: 0.5 10*3/uL (ref 0.1–1.0)
Monocytes Relative: 6 %
Neutro Abs: 3.6 10*3/uL (ref 1.7–7.7)
Neutrophils Relative %: 51 %
Platelets: 356 10*3/uL (ref 150–400)
RBC: 4.38 MIL/uL (ref 3.87–5.11)
RDW: 12 % (ref 11.5–15.5)
WBC: 7.1 10*3/uL (ref 4.0–10.5)
nRBC: 0 % (ref 0.0–0.2)

## 2018-12-18 LAB — SEDIMENTATION RATE: Sed Rate: 32 mm/hr — ABNORMAL HIGH (ref 0–22)

## 2018-12-18 LAB — C-REACTIVE PROTEIN: CRP: 1.5 mg/dL — ABNORMAL HIGH (ref <1.0)

## 2018-12-18 MED ORDER — PREDNISONE 20 MG PO TABS: 20.0000 mg | ORAL_TABLET | Freq: Two times a day (BID) | ORAL | 0 refills | Status: DC

## 2018-12-18 NOTE — ED Triage Notes (Signed)
The patient presented to the [facility] with a complaint of sinus pain and pressure to the right side of her face x 2 days.

## 2018-12-18 NOTE — Discharge Instructions (Signed)
Your lab test results will be available in 1 to 2 days You need to start prednisone 40 mg a day Stay on this dose until you see your primary care doctor in follow-up Call your primary care doctor on Monday to be seen Monday next week Do not run out of prednisone. May need additional testing Watch diet and sugar carefully while on prednisone

## 2018-12-18 NOTE — ED Provider Notes (Signed)
[place]    CSN: [id number] Arrival date & time: 12/18/18  1205     History   Chief Complaint Chief Complaint  Patient presents with  . Facial Pain    HPI Ellen Avery is a 53 y.o. female.   HPI  Patient has had a longstanding history of environmental allergies, recurring sinus infections, recurring nasal congestions, trips to the ER, urgent care center, primary care doctor, and ENT.  She states she saw ENT 3 months ago.  She is here today complaining of pain in her right temple.  She states that it feels like a pressure and pain, is causing her to have a headache.  She thinks it is in her "sinus".  She still has rhinorrhea.  She still using Flonase and nasal saline.  She states she can breathe through her nose.  No fever chills.  No nausea vomiting.  No cough.  No defect in vision.  No body aches.  No history of migraines or headache sort of  Past Medical History:  Diagnosis Date  . Allergy   . Anxiety and depression   . Arthritis   . Depression   . Diabetes mellitus without complication ([place])   . Fibromyalgia   . GERD (gastroesophageal reflux disease)   . Hypercholesteremia   . Hypertension   . IBS (irritable bowel syndrome)   . Obesity   . Rectal ulcer     Patient Active Problem List   Diagnosis Date Noted  . IDDM (insulin dependent diabetes mellitus) ([place]) 07/08/2018  . HTN (hypertension) 07/08/2018  . HLD (hyperlipidemia) 07/08/2018  . Altered mental status 03/02/2016  . MDD (major depressive disorder), recurrent episode, moderate ([place]) 03/02/2016  . Psychoses [facility])     Past Surgical History:  Procedure Laterality Date  . ABDOMINAL HYSTERECTOMY     total hysterectomy    OB History   No obstetric history on file.      Home Medications    Prior to Admission medications   Medication Sig Start Date End Date Taking? Authorizing Provider  ACCU-CHEK AVIVA PLUS test strip USE TO CHECK BLOOD SUGAR 4 TIMES DAILY 05/25/18   [provider]  aspirin EC 81 MG tablet Take 81 mg by mouth daily.    [provider]  Cholecalciferol (VITAMIN D3) 3000 units TABS Take 3,000 Units by mouth daily.    [provider]  cyclobenzaprine (FLEXERIL) 10 MG tablet Take 1 tablet (10 mg total) by mouth 2 (two) times daily as needed for muscle spasms. 08/30/18   Law, [name], PA-C  fluticasone (FLONASE) 50 MCG/ACT nasal spray Place 2 sprays into both nostrils daily. 12/07/18   Couture, Cortni S, PA-C  guaiFENesin (MUCINEX) 600 MG 12 hr tablet Take 1 tablet (600 mg total) by mouth 2 (two) times daily. 12/07/18   Couture, Cortni S, PA-C  hydrOXYzine (ATARAX/VISTARIL) 25 MG tablet Take 1 tablet (25 mg total) by mouth every 6 (six) hours as needed for anxiety. 09/13/18   [name], [name], MD  insulin glargine (LANTUS) 100 UNIT/ML injection Inject 15 Units into the skin at bedtime.     [provider]  linaclotide [facility]) 145 MCG CAPS capsule Take 1 capsule (145 mcg total) by mouth daily before breakfast. 06/24/18   Willia Craze, NP  losartan (COZAAR) 25 MG tablet Take 25 mg by mouth daily.    [provider]  metFORMIN (GLUCOPHAGE) 1000 MG tablet Take 1,000 mg by mouth daily with breakfast.  [provider]  naproxen (NAPROSYN) 500 MG tablet Take 1 tablet (500 mg total) by mouth 2 (two) times daily. 08/30/18   Law, [name], PA-C  predniSONE (DELTASONE) 20 MG tablet Take 1 tablet (20 mg total) by mouth 2 (two) times daily with a meal. 12/18/18   Raylene Everts, MD  simvastatin (ZOCOR) 40 MG tablet Take 40 mg by mouth every evening. Reported on 03/01/2016    [provider]  sodium chloride (OCEAN) 0.65 % SOLN nasal spray Place 1 spray into both nostrils as needed for congestion. 08/30/18   Frederica Kuster, PA-C    Family History Family History  Problem Relation Age of Onset  . Diabetes Mother   . Heart disease Paternal Grandmother   . Diabetes Paternal Uncle   . Colon  cancer Neg Hx   . Colon polyps Neg Hx   . Esophageal cancer Neg Hx   . Rectal cancer Neg Hx   . Stomach cancer Neg Hx     Social History Social History   Tobacco Use  . Smoking status: Current Every Day Smoker    Packs/day: 1.00    Types: Cigarettes  . Smokeless tobacco: Never Used  Substance Use Topics  . Alcohol use: No  . Drug use: No     Allergies   Flagyl [metronidazole]   Review of Systems Review of Systems  Constitutional: Negative for chills and fever.  HENT: Positive for congestion, postnasal drip, rhinorrhea and sinus pressure. Negative for ear pain, sore throat and voice change.   Eyes: Negative for pain and visual disturbance.  Respiratory: Negative for cough and shortness of breath.   Cardiovascular: Negative for chest pain and palpitations.  Gastrointestinal: Negative for abdominal pain and vomiting.  Genitourinary: Negative for dysuria and hematuria.  Musculoskeletal: Negative for arthralgias and back pain.  Skin: Negative for color change and rash.  Neurological: Positive for headaches. Negative for seizures, syncope and light-headedness.  All other systems reviewed and are negative.    Physical Exam Triage Vital Signs ED Triage Vitals  Enc Vitals Group     BP 12/18/18 1242 128/83     Pulse Rate 12/18/18 1242 74     Resp 12/18/18 1242 18     Temp 12/18/18 1242 98 F (36.7 C)     Temp Source 12/18/18 1242 Oral     SpO2 12/18/18 1242 99 %     Weight --      Height --      Head Circumference --      Peak Flow --      Pain Score 12/18/18 1240 7     Pain Loc --      Pain Edu? --      Excl. in [place]? --    No data found.  Updated Vital Signs BP 128/83 (BP Location: Right Arm)   Pulse 74   Temp 98 F (36.7 C) (Oral)   Resp 18   SpO2 99%  :     Physical Exam Constitutional:      General: She is not in acute distress.    Appearance: She is well-developed. She is obese.     Comments: Appears uncomfortable  HENT:     Head: Normocephalic  and atraumatic.      Right Ear: Tympanic membrane and ear canal normal.     Left Ear: Tympanic membrane and ear canal normal.     Nose: Congestion and rhinorrhea present.     Comments: Nasal turbinates congested  and red.  Clear rhinorrhea.  Nasal polyp is seen.    Mouth/Throat:     Mouth: Mucous membranes are moist.     Pharynx: Oropharynx is clear.  Eyes:     Extraocular Movements: Extraocular movements intact.     Conjunctiva/sclera: Conjunctivae normal.     Pupils: Pupils are equal, round, and reactive to light.  Neck:     Musculoskeletal: Normal range of motion and neck supple. No muscular tenderness.  Cardiovascular:     Rate and Rhythm: Normal rate and regular rhythm.     Heart sounds: Normal heart sounds.  Pulmonary:     Effort: Pulmonary effort is normal. No respiratory distress.     Breath sounds: Normal breath sounds.  Abdominal:     General: There is no distension.     Palpations: Abdomen is soft.  Musculoskeletal: Normal range of motion.  Lymphadenopathy:     Cervical: No cervical adenopathy.  Skin:    General: Skin is warm and dry.     Findings: No rash.  Neurological:     General: No focal deficit present.     Mental Status: She is alert. Mental status is at baseline.  Psychiatric:        Mood and Affect: Mood normal.        Thought Content: Thought content normal.      UC Treatments / Results  Labs (all labs ordered are listed, but only abnormal results are displayed) Labs Reviewed  C-REACTIVE PROTEIN - Abnormal; Notable for the following components:      Result Value   CRP 1.5 (*)    All other components within normal limits  SEDIMENTATION RATE - Abnormal; Notable for the following components:   Sed Rate 32 (*)    All other components within normal limits  CBC WITH DIFFERENTIAL/PLATELET  CBC with differential is normal  EKG None  Radiology No results found.  Procedures Procedures (including critical care time)  Medications Ordered in  UC Medications - No data to display  Initial Impression / Assessment and Plan / UC Course  I have reviewed the triage vital signs and the nursing notes.  Pertinent labs & imaging results that were available during my care of the patient were reviewed by me and considered in my medical decision making (see chart for details).    Explained that I do not believe this is from her sinuses.  This may be temporal arteritis.  I am going to do blood work to confirm.  She needs to call her primary care doctor on Monday to set up an appointment for next week.  Have given her 2 weeks of prednisone.  I cautioned her that this will bump her blood sugar up and that she must be very careful with her diet.  Call her family practice doctor if the sugar needs management. Final Clinical Impressions(s) / UC Diagnoses   Final diagnoses:  Giant cell arteritis ([place])  Other vascular headache     Discharge Instructions     Your lab test results will be available in 1 to 2 days You need to start prednisone 40 mg a day Stay on this dose until you see your primary care doctor in follow-up Call your primary care doctor on Monday to be seen Monday next week Do not run out of prednisone. May need additional testing Watch diet and sugar carefully while on prednisone   ED Prescriptions    Medication Sig Dispense Auth. Provider   predniSONE (DELTASONE) 20 MG  tablet Take 1 tablet (20 mg total) by mouth 2 (two) times daily with a meal. 20 tablet Raylene Everts, MD     Controlled Substance Prescriptions [place] Controlled Substance Registry consulted? Not Applicable   Raylene Everts, MD 12/18/18 [phone]

## 2018-12-19 ENCOUNTER — Telehealth (HOSPITAL_COMMUNITY): Payer: Self-pay | Admitting: Emergency Medicine

## 2018-12-19 NOTE — Telephone Encounter (Signed)
Patient contacted and made aware, all questions answered. Patient will follow up with PCP

## 2018-12-22 ENCOUNTER — Encounter (HOSPITAL_COMMUNITY): Payer: Self-pay

## 2018-12-22 ENCOUNTER — Emergency Department (HOSPITAL_COMMUNITY)
Admission: EM | Admit: 2018-12-22 | Discharge: 2018-12-22 | Disposition: A | Discharge status: Home / Self Care | Payer: Medicaid Other | Attending: Emergency Medicine | Admitting: Emergency Medicine

## 2018-12-22 DIAGNOSIS — F17210 Nicotine dependence, cigarettes, uncomplicated: Secondary | ICD-10-CM | POA: Diagnosis not present

## 2018-12-22 DIAGNOSIS — R51 Headache: Secondary | ICD-10-CM | POA: Diagnosis present

## 2018-12-22 DIAGNOSIS — Z7982 Long term (current) use of aspirin: Secondary | ICD-10-CM | POA: Diagnosis not present

## 2018-12-22 DIAGNOSIS — Z79899 Other long term (current) drug therapy: Secondary | ICD-10-CM | POA: Insufficient documentation

## 2018-12-22 DIAGNOSIS — R519 Headache, unspecified: Secondary | ICD-10-CM

## 2018-12-22 DIAGNOSIS — E119 Type 2 diabetes mellitus without complications: Secondary | ICD-10-CM | POA: Insufficient documentation

## 2018-12-22 DIAGNOSIS — I10 Essential (primary) hypertension: Secondary | ICD-10-CM | POA: Insufficient documentation

## 2018-12-22 DIAGNOSIS — Z794 Long term (current) use of insulin: Secondary | ICD-10-CM | POA: Insufficient documentation

## 2018-12-22 MED ORDER — BUTALBITAL-APAP-CAFFEINE 50-325-40 MG PO TABS: 1.0000 | ORAL_TABLET | Freq: Four times a day (QID) | ORAL | 0 refills | Status: DC | PRN

## 2018-12-22 NOTE — ED Notes (Signed)
Pt given graham crackers and Coke  

## 2018-12-22 NOTE — ED Provider Notes (Signed)
[facility] EMERGENCY DEPARTMENT Provider Note   CSN: [id number] Arrival date & time: 12/22/18  0126     History   Chief Complaint Chief Complaint  Patient presents with  . Facial Pain    HPI Ellen Avery is a 53 y.o. female.  The history is provided by the patient and medical records. No language interpreter was used.     53 year old female with history of diabetes, fibromyalgia, seasonal allergies presenting complaining of facial pain.  Patient report for the past 5 days she has had episodic pain to her right temporal.  She described as a toothache sensation, radiates towards her head and down to her jaw.  Pain is currently 6 out of 10 and it has been waxing and waning.  There is no associated fever, chills, vision changes, loss of vision, dental pain, neck pain, or rash.  She denies any recent injury.  She denies any focal numbness or weakness or confusion.  She was seen at urgent care 4 days ago for her complaint.  The provider obtain blood work and prescribed patient prednisone's 20 mg twice daily for suspicion of temporal arteritis.  She was notified yesterday that her blood work does have findings concerning for temporal arteritis.  She is here today due to her pain not being well controlled.  She states she has not been compliant with her prednisone as it has caused her blood sugar to rise.  She does have a primary care provider but have not follow-up with yet.  Past Medical History:  Diagnosis Date  . Allergy   . Anxiety and depression   . Arthritis   . Depression   . Diabetes mellitus without complication ([place])   . Fibromyalgia   . GERD (gastroesophageal reflux disease)   . Hypercholesteremia   . Hypertension   . IBS (irritable bowel syndrome)   . Obesity   . Rectal ulcer     Patient Active Problem List   Diagnosis Date Noted  . IDDM (insulin dependent diabetes mellitus) ([place]) 07/08/2018  . HTN (hypertension) 07/08/2018  . HLD (hyperlipidemia)  07/08/2018  . Altered mental status 03/02/2016  . MDD (major depressive disorder), recurrent episode, moderate ([place]) 03/02/2016  . Psychoses [facility])     Past Surgical History:  Procedure Laterality Date  . ABDOMINAL HYSTERECTOMY     total hysterectomy     OB History   No obstetric history on file.      Home Medications    Prior to Admission medications   Medication Sig Start Date End Date Taking? Authorizing Provider  aspirin EC 81 MG tablet Take 81 mg by mouth daily.   Yes [provider]  Cholecalciferol (VITAMIN D3) 3000 units TABS Take 3,000 Units by mouth daily.   Yes [provider]  cyclobenzaprine (FLEXERIL) 10 MG tablet Take 1 tablet (10 mg total) by mouth 2 (two) times daily as needed for muscle spasms. 08/30/18  Yes Law, [name], PA-C  DULoxetine (CYMBALTA) 30 MG capsule Take 30 mg by mouth daily. 12/07/18  Yes [provider]  fluticasone (FLONASE) 50 MCG/ACT nasal spray Place 2 sprays into both nostrils daily. 12/07/18  Yes Couture, Cortni S, PA-C  gabapentin (NEURONTIN) 100 MG capsule Take 100-200 mg by mouth See admin instructions. Take 100 mg I the morning and 200 mg in the evening 11/01/18  Yes [provider]  hydrOXYzine (ATARAX/VISTARIL) 25 MG tablet Take 1 tablet (25 mg total) by mouth every 6 (six) hours as needed for  anxiety. 09/13/18  Yes [name], [name], MD  insulin glargine (LANTUS) 100 UNIT/ML injection Inject 15 Units into the skin at bedtime.    Yes [provider]  linaclotide Rolan Lipa) 145 MCG CAPS capsule Take 1 capsule (145 mcg total) by mouth daily before breakfast. 06/24/18  Yes Willia Craze, NP  losartan (COZAAR) 25 MG tablet Take 25 mg by mouth daily.   Yes [provider]  losartan-hydrochlorothiazide (HYZAAR) 100-12.5 MG tablet Take 1 tablet by mouth daily. 11/01/18  Yes [provider]  metFORMIN (GLUCOPHAGE) 1000 MG tablet Take 1,000 mg by mouth daily with breakfast.   Yes  [provider]  predniSONE (DELTASONE) 20 MG tablet Take 1 tablet (20 mg total) by mouth 2 (two) times daily with a meal. 12/18/18  Yes Raylene Everts, MD  simvastatin (ZOCOR) 40 MG tablet Take 40 mg by mouth every evening. Reported on 03/01/2016   Yes [provider]  sodium chloride (OCEAN) 0.65 % SOLN nasal spray Place 1 spray into both nostrils as needed for congestion. 08/30/18  Yes Law, [name], PA-C  ACCU-CHEK AVIVA PLUS test strip USE TO CHECK BLOOD SUGAR 4 TIMES DAILY 05/25/18   [provider]  butalbital-acetaminophen-caffeine (FIORICET, ESGIC) [phone] MG tablet Take 1-2 tablets by mouth every 6 (six) hours as needed for headache. 12/22/18 12/22/19  Domenic Moras, PA-C  guaiFENesin (MUCINEX) 600 MG 12 hr tablet Take 1 tablet (600 mg total) by mouth 2 (two) times daily. Patient not taking: Reported on 12/22/2018 12/07/18   Couture, Cortni S, PA-C  naproxen (NAPROSYN) 500 MG tablet Take 1 tablet (500 mg total) by mouth 2 (two) times daily. Patient not taking: Reported on 12/22/2018 08/30/18   Frederica Kuster, PA-C    Family History Family History  Problem Relation Age of Onset  . Diabetes Mother   . Heart disease Paternal Grandmother   . Diabetes Paternal Uncle   . Colon cancer Neg Hx   . Colon polyps Neg Hx   . Esophageal cancer Neg Hx   . Rectal cancer Neg Hx   . Stomach cancer Neg Hx     Social History Social History   Tobacco Use  . Smoking status: Current Every Day Smoker    Packs/day: 1.00    Types: Cigarettes  . Smokeless tobacco: Never Used  Substance Use Topics  . Alcohol use: No  . Drug use: No     Allergies   Flagyl [metronidazole]   Review of Systems Review of Systems  All other systems reviewed and are negative.    Physical Exam Updated Vital Signs BP 112/86   Pulse 70   Temp 98.7 F (37.1 C) (Oral)   Resp 20   SpO2 96%   Physical Exam Vitals signs and nursing note reviewed.  Constitutional:      General:  She is not in acute distress.    Appearance: She is well-developed.  HENT:     Head: Atraumatic.     Comments: Tenderness to palpation about the right temporal region without any overlying skin changes, no swelling, no pulsatile mass or any appreciable bruit noted    Right Ear: Tympanic membrane normal.     Left Ear: Tympanic membrane normal.     Nose: Nose normal.  Eyes:     Extraocular Movements: Extraocular movements intact.     Conjunctiva/sclera: Conjunctivae normal.     Pupils: Pupils are equal, round, and reactive to light.  Neck:     Musculoskeletal: Normal range  of motion and neck supple. No neck rigidity.  Cardiovascular:     Rate and Rhythm: Normal rate and regular rhythm.  Lymphadenopathy:     Cervical: No cervical adenopathy.  Skin:    Findings: No rash.  Neurological:     Mental Status: She is alert and oriented to person, place, and time.  Psychiatric:        Mood and Affect: Mood normal.      ED Treatments / Results  Labs (all labs ordered are listed, but only abnormal results are displayed) Labs Reviewed - No data to display  EKG None  Radiology No results found.  Procedures Procedures (including critical care time)  Medications Ordered in ED Medications - No data to display   Initial Impression / Assessment and Plan / ED Course  I have reviewed the triage vital signs and the nursing notes.  Pertinent labs & imaging results that were available during my care of the patient were reviewed by me and considered in my medical decision making (see chart for details).     BP 112/86   Pulse 70   Temp 98.7 F (37.1 C) (Oral)   Resp 20   SpO2 96%    Final Clinical Impressions(s) / ED Diagnoses   Final diagnoses:  Facial pain    ED Discharge Orders         Ordered    butalbital-acetaminophen-caffeine (FIORICET, ESGIC) 50-325-40 MG tablet  Every 6 hours PRN     12/22/18 0655         6:59 AM Patient here with recurrent right temporal  pain.  She was seen at urgent care 4 days ago for her complaint, labs was obtained, labs shows elevated sed rate of 32, and elevated CRP of 1.5 with suggest for possible temporal arteritis.  She will need a confirmatory biopsy for further treatment.  Patient would benefit from steroid up to 60 mg daily for the next 2 to 4 weeks.  She mentioned it has affected her blood sugar at home and her pain is not well controlled.  She has not been fully compliant with that.  I strongly urged patient to be compliant with her steroid and would also prescribe additional pain medication to help her with her headache.  Patient does not have any vision changes at this time.   Domenic Moras, PA-C 12/22/18 9450    Orpah Greek, MD 12/29/18 2350

## 2018-12-22 NOTE — ED Notes (Signed)
Patient called in lobby no answer

## 2018-12-22 NOTE — ED Notes (Signed)
Patient was outside came back in.

## 2018-12-22 NOTE — ED Triage Notes (Signed)
Pt having R sided facial pain, seen at [facility] for the same with pain unrelieved by prednisone.

## 2018-12-22 NOTE — Discharge Instructions (Signed)
Your headache may be due temporal arteritis, a condition of inflammation of your temporal artery.  You will need to call and follow up closely with your primary care provider today for further testing which will include a biopsy of your artery.  Take prednisone up to 60mg  daily for the next several weeks.  Monitor your blood sugar closely.  Take fioricet as needed for headache.

## 2018-12-24 ENCOUNTER — Other Ambulatory Visit: Payer: Self-pay

## 2018-12-24 ENCOUNTER — Encounter (HOSPITAL_BASED_OUTPATIENT_CLINIC_OR_DEPARTMENT_OTHER): Payer: Self-pay | Admitting: *Deleted

## 2018-12-24 NOTE — Progress Notes (Addendum)
Spoke w/ pt via phone for pre-op interview.  Npo after mn w/ exception clear liquids until 0815 (no cream/milk products).  After 0815 nothing by mouth including candy,gum,mints, only sips of water with gabapentin, cymbalta, prednisone, flonase nasal spray.  If needed may take fioricet/ hydroxyzine.  Needs istat 8.  Current ekg in chart and epic.  Pt commented she is worried her blood sugar will be high day of surgery, since started on prednisone, her pcp increased her lantus insulin from 15 units to 50 units qhs since her blood sugar had been as high as 400.  Pt verbalized understanding to take usual dose 50 units lantus night before surgery and do not take metformin morning of surgar. Reviewed with Konrad Felix PA, anesthesia, she agreed with this.

## 2018-12-28 ENCOUNTER — Ambulatory Visit (HOSPITAL_BASED_OUTPATIENT_CLINIC_OR_DEPARTMENT_OTHER): Payer: Medicaid Other | Admitting: Anesthesiology

## 2018-12-28 ENCOUNTER — Encounter (HOSPITAL_BASED_OUTPATIENT_CLINIC_OR_DEPARTMENT_OTHER): Payer: Self-pay | Admitting: *Deleted

## 2018-12-28 ENCOUNTER — Other Ambulatory Visit: Payer: Self-pay

## 2018-12-28 ENCOUNTER — Ambulatory Visit (HOSPITAL_BASED_OUTPATIENT_CLINIC_OR_DEPARTMENT_OTHER)
Admission: RE | Admit: 2018-12-28 | Discharge: 2018-12-28 | Disposition: A | Discharge status: Home / Self Care | Payer: Medicaid Other | Attending: General Surgery | Admitting: General Surgery

## 2018-12-28 ENCOUNTER — Ambulatory Visit: Payer: Self-pay | Admitting: General Surgery

## 2018-12-28 ENCOUNTER — Encounter (HOSPITAL_BASED_OUTPATIENT_CLINIC_OR_DEPARTMENT_OTHER)
Admission: RE | Disposition: A | Discharge status: Home / Self Care | Payer: Self-pay | Source: Home / Self Care | Attending: General Surgery

## 2018-12-28 DIAGNOSIS — I10 Essential (primary) hypertension: Secondary | ICD-10-CM | POA: Insufficient documentation

## 2018-12-28 DIAGNOSIS — Z7982 Long term (current) use of aspirin: Secondary | ICD-10-CM | POA: Insufficient documentation

## 2018-12-28 DIAGNOSIS — M797 Fibromyalgia: Secondary | ICD-10-CM | POA: Insufficient documentation

## 2018-12-28 DIAGNOSIS — Z6834 Body mass index (BMI) 34.0-34.9, adult: Secondary | ICD-10-CM | POA: Diagnosis not present

## 2018-12-28 DIAGNOSIS — E669 Obesity, unspecified: Secondary | ICD-10-CM | POA: Diagnosis not present

## 2018-12-28 DIAGNOSIS — R51 Headache: Secondary | ICD-10-CM | POA: Insufficient documentation

## 2018-12-28 DIAGNOSIS — Z792 Long term (current) use of antibiotics: Secondary | ICD-10-CM | POA: Diagnosis not present

## 2018-12-28 DIAGNOSIS — E7800 Pure hypercholesterolemia, unspecified: Secondary | ICD-10-CM | POA: Insufficient documentation

## 2018-12-28 DIAGNOSIS — K219 Gastro-esophageal reflux disease without esophagitis: Secondary | ICD-10-CM | POA: Insufficient documentation

## 2018-12-28 DIAGNOSIS — F17210 Nicotine dependence, cigarettes, uncomplicated: Secondary | ICD-10-CM | POA: Diagnosis not present

## 2018-12-28 DIAGNOSIS — Z794 Long term (current) use of insulin: Secondary | ICD-10-CM | POA: Insufficient documentation

## 2018-12-28 DIAGNOSIS — E119 Type 2 diabetes mellitus without complications: Secondary | ICD-10-CM | POA: Insufficient documentation

## 2018-12-28 DIAGNOSIS — E559 Vitamin D deficiency, unspecified: Secondary | ICD-10-CM | POA: Insufficient documentation

## 2018-12-28 DIAGNOSIS — F419 Anxiety disorder, unspecified: Secondary | ICD-10-CM | POA: Insufficient documentation

## 2018-12-28 DIAGNOSIS — Z881 Allergy status to other antibiotic agents status: Secondary | ICD-10-CM | POA: Insufficient documentation

## 2018-12-28 DIAGNOSIS — Z79899 Other long term (current) drug therapy: Secondary | ICD-10-CM | POA: Diagnosis not present

## 2018-12-28 HISTORY — DX: Major depressive disorder, single episode, unspecified: F32.9

## 2018-12-28 HISTORY — DX: Nasal congestion: R09.81

## 2018-12-28 HISTORY — DX: Other cervical disc degeneration, unspecified cervical region: M50.30

## 2018-12-28 HISTORY — DX: Type 2 diabetes mellitus without complications: E11.9

## 2018-12-28 HISTORY — DX: Vitamin D deficiency, unspecified: E55.9

## 2018-12-28 HISTORY — DX: Headache, unspecified: R51.9

## 2018-12-28 HISTORY — DX: Other chronic pain: G89.29

## 2018-12-28 HISTORY — DX: Type 2 diabetes mellitus without complications: Z79.4

## 2018-12-28 HISTORY — DX: Other seasonal allergic rhinitis: J30.2

## 2018-12-28 HISTORY — DX: Anxiety disorder, unspecified: F41.9

## 2018-12-28 HISTORY — DX: Presence of dental prosthetic device (complete) (partial): Z97.2

## 2018-12-28 HISTORY — DX: Personal history of colonic polyps: Z86.010

## 2018-12-28 HISTORY — DX: Personal history of colon polyps, unspecified: Z86.0100

## 2018-12-28 HISTORY — DX: Presence of spectacles and contact lenses: Z97.3

## 2018-12-28 HISTORY — PX: ARTERY BIOPSY: SHX891

## 2018-12-28 HISTORY — DX: Personal history of other mental and behavioral disorders: Z86.59

## 2018-12-28 HISTORY — DX: Headache: R51

## 2018-12-28 LAB — POCT I-STAT 4, (NA,K, GLUC, HGB,HCT)
Glucose, Bld: 152 mg/dL — ABNORMAL HIGH (ref 70–99)
HCT: 38 % (ref 36.0–46.0)
Hemoglobin: 12.9 g/dL (ref 12.0–15.0)
Potassium: 3.9 mmol/L (ref 3.5–5.1)
SODIUM: 139 mmol/L (ref 135–145)

## 2018-12-28 LAB — GLUCOSE, CAPILLARY: Glucose-Capillary: 113 mg/dL — ABNORMAL HIGH (ref 70–99)

## 2018-12-28 SURGERY — BIOPSY TEMPORAL ARTERY
Anesthesia: General | Site: Head | Laterality: Right

## 2018-12-28 MED ORDER — FENTANYL CITRATE (PF) 100 MCG/2ML IJ SOLN
25.0000 ug | INTRAMUSCULAR | Status: DC | PRN
  Filled 2018-12-28: qty 1

## 2018-12-28 MED ORDER — IBUPROFEN 800 MG PO TABS: 800.0000 mg | ORAL_TABLET | Freq: Three times a day (TID) | ORAL | 0 refills | Status: DC | PRN

## 2018-12-28 MED ORDER — DEXAMETHASONE SODIUM PHOSPHATE 10 MG/ML IJ SOLN
INTRAMUSCULAR | Status: DC | PRN
  Administered 2018-12-28: 4 mg via INTRAVENOUS

## 2018-12-28 MED ORDER — CEFAZOLIN SODIUM-DEXTROSE 2-4 GM/100ML-% IV SOLN
2.0000 g | INTRAVENOUS | Status: AC
  Administered 2018-12-28: 2 g via INTRAVENOUS
  Filled 2018-12-28: qty 100

## 2018-12-28 MED ORDER — FENTANYL CITRATE (PF) 100 MCG/2ML IJ SOLN
INTRAMUSCULAR | Status: AC
  Filled 2018-12-28: qty 2

## 2018-12-28 MED ORDER — CHLORHEXIDINE GLUCONATE CLOTH 2 % EX PADS
6.0000 | MEDICATED_PAD | Freq: Once | CUTANEOUS | Status: DC
  Filled 2018-12-28: qty 6

## 2018-12-28 MED ORDER — CEFAZOLIN SODIUM-DEXTROSE 2-4 GM/100ML-% IV SOLN
INTRAVENOUS | Status: AC
  Filled 2018-12-28: qty 100

## 2018-12-28 MED ORDER — DEXAMETHASONE SODIUM PHOSPHATE 10 MG/ML IJ SOLN
INTRAMUSCULAR | Status: AC
  Filled 2018-12-28: qty 1

## 2018-12-28 MED ORDER — GABAPENTIN 300 MG PO CAPS
ORAL_CAPSULE | ORAL | Status: AC
  Filled 2018-12-28: qty 1

## 2018-12-28 MED ORDER — LACTATED RINGERS IV SOLN
INTRAVENOUS | Status: DC
  Administered 2018-12-28: 13:00:00 via INTRAVENOUS
  Filled 2018-12-28: qty 1000

## 2018-12-28 MED ORDER — PROPOFOL 10 MG/ML IV BOLUS
INTRAVENOUS | Status: AC
  Filled 2018-12-28: qty 20

## 2018-12-28 MED ORDER — MEPERIDINE HCL 25 MG/ML IJ SOLN
6.2500 mg | INTRAMUSCULAR | Status: DC | PRN
  Filled 2018-12-28: qty 1

## 2018-12-28 MED ORDER — OXYCODONE HCL 5 MG PO TABS
5.0000 mg | ORAL_TABLET | Freq: Once | ORAL | Status: DC | PRN
  Filled 2018-12-28: qty 1

## 2018-12-28 MED ORDER — ONDANSETRON HCL 4 MG/2ML IJ SOLN
4.0000 mg | Freq: Once | INTRAMUSCULAR | Status: DC | PRN
  Filled 2018-12-28: qty 2

## 2018-12-28 MED ORDER — OXYCODONE HCL 5 MG/5ML PO SOLN
5.0000 mg | Freq: Once | ORAL | Status: DC | PRN
  Filled 2018-12-28: qty 5

## 2018-12-28 MED ORDER — MIDAZOLAM HCL 2 MG/2ML IJ SOLN
INTRAMUSCULAR | Status: AC
  Filled 2018-12-28: qty 2

## 2018-12-28 MED ORDER — ONDANSETRON HCL 4 MG/2ML IJ SOLN
INTRAMUSCULAR | Status: DC | PRN
  Administered 2018-12-28: 4 mg via INTRAVENOUS

## 2018-12-28 MED ORDER — ACETAMINOPHEN 325 MG PO TABS
325.0000 mg | ORAL_TABLET | ORAL | Status: DC | PRN
  Filled 2018-12-28: qty 2

## 2018-12-28 MED ORDER — ACETAMINOPHEN 160 MG/5ML PO SOLN
325.0000 mg | ORAL | Status: DC | PRN
  Filled 2018-12-28: qty 20.3

## 2018-12-28 MED ORDER — GABAPENTIN 300 MG PO CAPS
300.0000 mg | ORAL_CAPSULE | ORAL | Status: AC
  Administered 2018-12-28: 300 mg via ORAL
  Filled 2018-12-28: qty 1

## 2018-12-28 MED ORDER — CELECOXIB 200 MG PO CAPS
200.0000 mg | ORAL_CAPSULE | ORAL | Status: AC
  Administered 2018-12-28: 200 mg via ORAL
  Filled 2018-12-28: qty 1

## 2018-12-28 MED ORDER — MIDAZOLAM HCL 5 MG/5ML IJ SOLN
INTRAMUSCULAR | Status: DC | PRN
  Administered 2018-12-28: 2 mg via INTRAVENOUS

## 2018-12-28 MED ORDER — FENTANYL CITRATE (PF) 100 MCG/2ML IJ SOLN
INTRAMUSCULAR | Status: DC | PRN
  Administered 2018-12-28: 100 ug via INTRAVENOUS

## 2018-12-28 MED ORDER — CELECOXIB 200 MG PO CAPS
ORAL_CAPSULE | ORAL | Status: AC
  Filled 2018-12-28: qty 1

## 2018-12-28 MED ORDER — LIDOCAINE HCL (CARDIAC) PF 100 MG/5ML IV SOSY
PREFILLED_SYRINGE | INTRAVENOUS | Status: DC | PRN
  Administered 2018-12-28: 80 mg via INTRAVENOUS

## 2018-12-28 MED ORDER — PROPOFOL 10 MG/ML IV BOLUS
INTRAVENOUS | Status: DC | PRN
  Administered 2018-12-28: 140 mg via INTRAVENOUS

## 2018-12-28 MED ORDER — ONDANSETRON HCL 4 MG/2ML IJ SOLN
INTRAMUSCULAR | Status: AC
  Filled 2018-12-28: qty 2

## 2018-12-28 MED ORDER — BUPIVACAINE HCL 0.5 % IJ SOLN
INTRAMUSCULAR | Status: DC | PRN
  Administered 2018-12-28: 8 mL

## 2018-12-28 MED ORDER — LIDOCAINE 2% (20 MG/ML) 5 ML SYRINGE
INTRAMUSCULAR | Status: AC
  Filled 2018-12-28: qty 5

## 2018-12-28 SURGICAL SUPPLY — 38 items
BLADE CLIPPER SENSICLIP SURGIC (BLADE) ×3 IMPLANT
BLADE SURG 15 STRL LF DISP TIS (BLADE) ×1 IMPLANT
BLADE SURG 15 STRL SS (BLADE) ×2
CLIP TI WIDE RED SMALL 6 (CLIP) ×3 IMPLANT
COVER BACK TABLE 60X90IN (DRAPES) ×3 IMPLANT
COVER MAYO STAND STRL (DRAPES) ×3 IMPLANT
COVER WAND RF STERILE (DRAPES) ×6 IMPLANT
DERMABOND ADVANCED (GAUZE/BANDAGES/DRESSINGS) ×2
DERMABOND ADVANCED .7 DNX12 (GAUZE/BANDAGES/DRESSINGS) ×1 IMPLANT
DRAPE LAPAROTOMY 100X72 PEDS (DRAPES) ×3 IMPLANT
ELECT NEEDLE TIP 2.8 STRL (NEEDLE) IMPLANT
ELECT REM PT RETURN 9FT ADLT (ELECTROSURGICAL) ×3
ELECTRODE REM PT RTRN 9FT ADLT (ELECTROSURGICAL) ×1 IMPLANT
GLOVE BIO SURGEON STRL SZ7 (GLOVE) ×3 IMPLANT
GLOVE BIOGEL PI IND STRL 7.0 (GLOVE) ×1 IMPLANT
GLOVE BIOGEL PI IND STRL 7.5 (GLOVE) ×2 IMPLANT
GLOVE BIOGEL PI INDICATOR 7.0 (GLOVE) ×2
GLOVE BIOGEL PI INDICATOR 7.5 (GLOVE) ×4
GLOVE SURG SS PI 7.0 STRL IVOR (GLOVE) ×3 IMPLANT
GOWN STRL REUS W/TWL LRG LVL3 (GOWN DISPOSABLE) ×3 IMPLANT
MARKER SKIN DUAL TIP RULER LAB (MISCELLANEOUS) ×3 IMPLANT
NEEDLE HYPO 25X1 1.5 SAFETY (NEEDLE) ×3 IMPLANT
NS IRRIG 500ML POUR BTL (IV SOLUTION) ×3 IMPLANT
PACK BASIN DAY SURGERY FS (CUSTOM PROCEDURE TRAY) ×3 IMPLANT
PENCIL BUTTON HOLSTER BLD 10FT (ELECTRODE) ×3 IMPLANT
SUCTION FRAZIER TIP 10 FR DISP (SUCTIONS) ×3 IMPLANT
SURGILUBE 2OZ TUBE FLIPTOP (MISCELLANEOUS) ×3 IMPLANT
SUT MNCRL AB 4-0 PS2 18 (SUTURE) ×3 IMPLANT
SUT SILK 4 0 TIES 17X18 (SUTURE) ×3 IMPLANT
SUT VIC AB 3-0 SH 27 (SUTURE) ×2
SUT VIC AB 3-0 SH 27X BRD (SUTURE) ×1 IMPLANT
SYR BULB IRRIGATION 50ML (SYRINGE) ×3 IMPLANT
SYR CONTROL 10ML LL (SYRINGE) ×3 IMPLANT
TOWEL OR 17X24 6PK STRL BLUE (TOWEL DISPOSABLE) ×6 IMPLANT
TRAY DSU PREP LF (CUSTOM PROCEDURE TRAY) ×3 IMPLANT
TUBE CONNECTING 12'X1/4 (SUCTIONS) ×1
TUBE CONNECTING 12X1/4 (SUCTIONS) ×2 IMPLANT
YANKAUER SUCT BULB TIP 10FT TU (MISCELLANEOUS) IMPLANT

## 2018-12-28 NOTE — Progress Notes (Signed)
Pt upset about location of incision and lack of dressing other than liquid skin glue. Reassured of wound cleanliness. Wants to speak to Dr. Kieth Brightly. Called and she spoke with him. He gave ok to me to apply a bandaid. Dried blood cleaned from hair and provided instructions for shower and ok to shampoo her hair tomorrow. Mood improved after speaking to Dr. Kieth Brightly and efforts made for clean up.

## 2018-12-28 NOTE — H&P (Signed)
Ellen Avery is an 53 y.o. female.   Chief Complaint: temporal pain HPI: 53 yo female with increasing pain on the right side of her head. ESR and CRP were elevated so decision was made to work towards temporal artery biopsy  Past Medical History:  Diagnosis Date  . Anxiety   . Arthritis   . Chronic headaches   . DDD (degenerative disc disease), cervical   . Fibromyalgia   . GERD (gastroesophageal reflux disease)   . History of colon polyps   . History of psychosis   . Hypercholesteremia   . Hypertension   . IBS (irritable bowel syndrome)   . MDD (major depressive disorder)   . Right sided facial pain   . Seasonal allergic rhinitis   . Sinus congestion    12-24-2018 per pt no fever  . Type 2 diabetes mellitus treated with insulin ([place])   . Vitamin D deficiency   . Wears dentures    full upper,  partial lower  . Wears glasses     Past Surgical History:  Procedure Laterality Date  . MULTIPLE TOOTH EXTRACTIONS  04/ 2019    sedation  . TOTAL ABDOMINAL HYSTERECTOMY W/ BILATERAL SALPINGOOPHORECTOMY  2009    Family History  Problem Relation Age of Onset  . Diabetes Mother   . Heart disease Paternal Grandmother   . Diabetes Paternal Uncle   . Colon cancer Neg Hx   . Colon polyps Neg Hx   . Esophageal cancer Neg Hx   . Rectal cancer Neg Hx   . Stomach cancer Neg Hx    Social History:  reports that she has been smoking cigarettes. She has a 35.00 pack-year smoking history. She has never used smokeless tobacco. She reports that she does not drink alcohol or use drugs.  Allergies:  Allergies  Allergen Reactions  . Flagyl [Metronidazole] Hives and Itching    Benadryl     Medications Prior to Admission  Medication Sig Dispense Refill  . ACCU-CHEK AVIVA PLUS test strip USE TO CHECK BLOOD SUGAR 4 TIMES DAILY  2  . aspirin EC 81 MG tablet Take 81 mg by mouth daily.    . butalbital-acetaminophen-caffeine (FIORICET, ESGIC) 50-325-40 MG tablet Take 1-2 tablets by mouth every 6  (six) hours as needed for headache. 20 tablet 0  . cefdinir (OMNICEF) 300 MG capsule Take 300 mg by mouth 2 (two) times daily.    . Cholecalciferol (VITAMIN D3) 3000 units TABS Take 3,000 Units by mouth daily.    . cyclobenzaprine (FLEXERIL) 10 MG tablet Take 1 tablet (10 mg total) by mouth 2 (two) times daily as needed for muscle spasms. 20 tablet 0  . DULoxetine (CYMBALTA) 30 MG capsule Take 30 mg by mouth daily.    . Famotidine (PEPCID AC PO) Take by mouth as needed.    . fluticasone (FLONASE) 50 MCG/ACT nasal spray Place 2 sprays into both nostrils daily. 16 g 0  . gabapentin (NEURONTIN) 100 MG capsule Take 100-200 mg by mouth See admin instructions. Take 100 mg I the morning and 200 mg in the evening    . guaiFENesin (MUCINEX) 600 MG 12 hr tablet Take 1 tablet (600 mg total) by mouth 2 (two) times daily. (Patient taking differently: Take 600 mg by mouth 2 (two) times daily as needed. ) 6 tablet 0  . hydrOXYzine (ATARAX/VISTARIL) 25 MG tablet Take 1 tablet (25 mg total) by mouth every 6 (six) hours as needed for anxiety. 20 tablet 0  . insulin  glargine (LANTUS) 100 UNIT/ML injection Inject 50 Units into the skin at bedtime.     Marland Kitchen losartan-hydrochlorothiazide (HYZAAR) 100-12.5 MG tablet Take 1 tablet by mouth daily.     . magnesium hydroxide (MILK OF MAGNESIA) 400 MG/5ML suspension Take by mouth daily as needed for mild constipation.    . metFORMIN (GLUCOPHAGE) 1000 MG tablet Take 1,000 mg by mouth daily with breakfast.     . predniSONE (DELTASONE) 20 MG tablet Take 1 tablet (20 mg total) by mouth 2 (two) times daily with a meal. (Patient taking differently: Take 20 mg by mouth 3 (three) times daily. ) 20 tablet 0  . simvastatin (ZOCOR) 40 MG tablet Take 40 mg by mouth every evening. Reported on 03/01/2016    . sodium chloride (OCEAN) 0.65 % SOLN nasal spray Place 1 spray into both nostrils as needed for congestion. 15 mL 0    No results found for this or any previous visit (from the past 48  hour(s)). No results found.  Review of Systems  Constitutional: Negative for chills and fever.  HENT: Negative for hearing loss.   Eyes: Negative for blurred vision and double vision.  Respiratory: Negative for cough and hemoptysis.   Cardiovascular: Negative for chest pain and palpitations.  Gastrointestinal: Negative for abdominal pain, nausea and vomiting.  Genitourinary: Negative for dysuria and urgency.  Musculoskeletal: Negative for myalgias and neck pain.  Skin: Negative for itching and rash.  Neurological: Positive for headaches. Negative for dizziness and tingling.  Endo/Heme/Allergies: Does not bruise/bleed easily.  Psychiatric/Behavioral: Negative for depression and suicidal ideas.    Blood pressure 138/89, pulse 75, temperature 98.9 F (37.2 C), temperature source Oral, resp. rate 18, height '5\' 7"'$  (1.702 m), weight 101.1 kg, SpO2 100 %. Physical Exam  Vitals reviewed. Constitutional: She is oriented to person, place, and time. She appears well-developed and well-nourished.  HENT:  Head: Normocephalic and atraumatic.  Eyes: Pupils are equal, round, and reactive to light. Conjunctivae and EOM are normal.  Neck: Normal range of motion. Neck supple.  Cardiovascular: Normal rate and regular rhythm.  Respiratory: Effort normal and breath sounds normal.  GI: Soft. Bowel sounds are normal. She exhibits no distension. There is no abdominal tenderness.  Musculoskeletal: Normal range of motion.  Neurological: She is alert and oriented to person, place, and time.  Skin: Skin is warm and dry.  Psychiatric: She has a normal mood and affect. Her behavior is normal.     Assessment/Plan 53 yo female with right sided head pain -temporal artery biopsy for r/o arteritis  Mickeal Skinner, MD 12/28/2018, 12:28 PM

## 2018-12-28 NOTE — Discharge Instructions (Signed)
Call your surgeon if you experience:  ° °1.  Fever over 101.0. °2.  Inability to urinate. °3.  Nausea and/or vomiting. °4.  Extreme swelling or bruising at the surgical site. °5.  Continued bleeding from the incision. °6.  Increased pain, redness or drainage from the incision. °7.  Problems related to your pain medication. °8.  Any problems and/or concerns °Post Anesthesia Home Care Instructions ° °Activity: °Get plenty of rest for the remainder of the day. A responsible individual must stay with you for 24 hours following the procedure.  °For the next 24 hours, DO NOT: °-Drive a car °-Operate machinery °-Drink alcoholic beverages °-Take any medication unless instructed by your physician °-Make any legal decisions or sign important papers. ° °Meals: °Start with liquid foods such as gelatin or soup. Progress to regular foods as tolerated. Avoid greasy, spicy, heavy foods. If nausea and/or vomiting occur, drink only clear liquids until the nausea and/or vomiting subsides. Call your physician if vomiting continues. ° °Special Instructions/Symptoms: °Your throat may feel dry or sore from the anesthesia or the breathing tube placed in your throat during surgery. If this causes discomfort, gargle with warm salt water. The discomfort should disappear within 24 hours. ° ° °  ° °

## 2018-12-28 NOTE — Transfer of Care (Signed)
Immediate Anesthesia Transfer of Care Note  Patient: Hermena Swint Fann  Procedure(s) Performed: RIGHT BIOPSY TEMPORAL ARTERY (Right Head)  Patient Location: PACU  Anesthesia Type:General  Level of Consciousness: awake, alert  and oriented  Airway & Oxygen Therapy: Patient Spontanous Breathing  Post-op Assessment: Report given to RN and Post -op Vital signs reviewed and stable  Post vital signs: Reviewed and stable  Last Vitals:  Vitals Value Taken Time  BP    Temp    Pulse 77 12/28/2018  2:17 PM  Resp    SpO2 97 % 12/28/2018  2:17 PM  Vitals shown include unvalidated device data.  Last Pain:  Vitals:   12/28/18 1251  TempSrc:   PainSc: 0-No pain      Patients Stated Pain Goal: 7 (28/00/34 9179)  Complications: No apparent anesthesia complications

## 2018-12-28 NOTE — Anesthesia Preprocedure Evaluation (Addendum)
Anesthesia Evaluation  Patient identified by MRN, date of birth, ID band Patient awake    Reviewed: Allergy & Precautions, H&P , NPO status , Patient's Chart, lab work & pertinent test results, reviewed documented beta blocker date and time   Airway Mallampati: II  TM Distance: >3 FB Neck ROM: full    Dental no notable dental hx. (+) Upper Dentures, Partial Lower   Pulmonary Current Smoker,    Pulmonary exam normal breath sounds clear to auscultation       Cardiovascular Exercise Tolerance: Good hypertension,  Rhythm:regular Rate:Normal     Neuro/Psych  Headaches, PSYCHIATRIC DISORDERS Anxiety Depression Schizophrenia History of psychosis   GI/Hepatic Neg liver ROS, GERD  ,  Endo/Other  diabetes, Type 2, Insulin Dependent, Oral Hypoglycemic AgentsObesity   Renal/GU negative Renal ROS  negative genitourinary   Musculoskeletal  (+) Arthritis , Fibromyalgia -  Abdominal   Peds  Hematology negative hematology ROS (+)   Anesthesia Other Findings   Reproductive/Obstetrics negative OB ROS                            Anesthesia Physical Anesthesia Plan  ASA: III  Anesthesia Plan: General   Post-op Pain Management:    Induction:   PONV Risk Score and Plan: 3 and Ondansetron, Treatment may vary due to age or medical condition, Midazolam and Diphenhydramine  Airway Management Planned: LMA  Additional Equipment:   Intra-op Plan:   Post-operative Plan:   Informed Consent: I have reviewed the patients History and Physical, chart, labs and discussed the procedure including the risks, benefits and alternatives for the proposed anesthesia with the patient or authorized representative who has indicated his/her understanding and acceptance.     Dental advisory given  Plan Discussed with: CRNA, Anesthesiologist and Surgeon  Anesthesia Plan Comments:       Anesthesia Quick Evaluation

## 2018-12-28 NOTE — Op Note (Signed)
Preoperative diagnosis: headaches  Postoperative diagnosis: same   Procedure: excisional biopsy of right temporal artery  Surgeon: Gurney Maxin, M.D.  Asst: none  Anesthesia: gen  Indications for procedure: Ellen Avery is a 52 y.o. year old female with symptoms of pain over right temple and elevated ESR. Due to concern for arteritis, she presents for biopsy.  Description of procedure: The patient was brought into the operative suite. Anesthesia was administered with General LMA anesthesia. WHO checklist was applied. The patient was then placed in supine right head tilt position. The area was prepped and draped in the usual sterile fashion.  Next, the Doppler was used to localize the artery.  The area was then anesthetized with Marcaine.  A 1 to centimeter incision was made cautery was used to dissect down through subcutaneous tissues and blunt dissection was used to isolate the artery.  Artery was tested with Doppler.  Then it was clamped and tied off using 3-0 silk.  Additional small clips were used.  The artery was divided and removed approximately 2 cm portion was removed.  Small area of bleeding hemostasis was applied with a clip.  Doppler was used and no artery was identified.  There was one additional vessel appearing structure that was divided and sent with specimen as well.  Hemostasis was applied with clips and cautery.  The wound was irrigated.  3-0 Vicryl was used to close deep dermal space.  4-0 Monocryl was used in running fashion to close subcuticular space.  Dermabond was put in as dressing.  Patient will from anesthesia was brought to PACU in stable condition.  Findings: Patent right temporal artery removed without difficulty  Specimen: Right temporal artery  Implant: None  Blood loss: 84m  Local anesthesia: [id number]  Complications: none  LGurney Maxin M.D. General, Bariatric, & Minimally Invasive Surgery [facility], [facility]

## 2018-12-28 NOTE — Anesthesia Procedure Notes (Signed)
Procedure Name: LMA Insertion Date/Time: 12/28/2018 1:23 PM Performed by: Jonna Munro, CRNA Pre-anesthesia Checklist: Patient identified, Emergency Drugs available, Suction available, Patient being monitored and Timeout performed Patient Re-evaluated:Patient Re-evaluated prior to induction Oxygen Delivery Method: Circle system utilized Preoxygenation: Pre-oxygenation with 100% oxygen Induction Type: IV induction LMA: LMA inserted LMA Size: 4.0 Number of attempts: 1 Placement Confirmation: positive ETCO2 and breath sounds checked- equal and bilateral Tube secured with: Tape Dental Injury: Teeth and Oropharynx as per pre-operative assessment

## 2018-12-29 ENCOUNTER — Encounter (HOSPITAL_BASED_OUTPATIENT_CLINIC_OR_DEPARTMENT_OTHER): Payer: Self-pay | Admitting: General Surgery

## 2018-12-29 NOTE — Anesthesia Postprocedure Evaluation (Signed)
Anesthesia Post Note  Patient: Ellen Avery  Procedure(s) Performed: RIGHT BIOPSY TEMPORAL ARTERY (Right Head)     Patient location during evaluation: PACU Anesthesia Type: General Level of consciousness: awake and alert Pain management: pain level controlled Vital Signs Assessment: post-procedure vital signs reviewed and stable Respiratory status: spontaneous breathing, nonlabored ventilation, respiratory function stable and patient connected to nasal cannula oxygen Cardiovascular status: blood pressure returned to baseline and stable Postop Assessment: no apparent nausea or vomiting Anesthetic complications: no    Last Vitals:  Vitals:   12/28/18 1445 12/28/18 1512  BP:  (!) 142/96  Pulse: 72 73  Resp: 15 16  Temp:  36.7 C  SpO2: 93% 100%    Last Pain:  Vitals:   12/28/18 1512  TempSrc:   PainSc: 0-No pain                 Catalina Gravel

## 2019-01-17 ENCOUNTER — Ambulatory Visit (HOSPITAL_COMMUNITY)
Admission: EM | Admit: 2019-01-17 | Discharge: 2019-01-17 | Disposition: A | Discharge status: Home / Self Care | Payer: Medicaid Other | Attending: Urgent Care | Admitting: Urgent Care

## 2019-01-17 ENCOUNTER — Other Ambulatory Visit: Payer: Self-pay

## 2019-01-17 ENCOUNTER — Encounter (HOSPITAL_COMMUNITY): Payer: Self-pay | Admitting: Emergency Medicine

## 2019-01-17 DIAGNOSIS — Z9109 Other allergy status, other than to drugs and biological substances: Secondary | ICD-10-CM

## 2019-01-17 DIAGNOSIS — R51 Headache: Secondary | ICD-10-CM

## 2019-01-17 DIAGNOSIS — R519 Headache, unspecified: Secondary | ICD-10-CM

## 2019-01-17 DIAGNOSIS — J0100 Acute maxillary sinusitis, unspecified: Secondary | ICD-10-CM

## 2019-01-17 MED ORDER — AMOXICILLIN 500 MG PO CAPS: 500.0000 mg | ORAL_CAPSULE | Freq: Three times a day (TID) | ORAL | 0 refills | Status: DC

## 2019-01-17 NOTE — ED Provider Notes (Signed)
MRN: [id number] DOB: Jul 10, 1966  Subjective:   Ellen Avery is a 53 y.o. female presenting for 1 week history of right-sided headache over area where she had her temporal artery biopsy.  States that the biopsy was negative confirming that she does not have temporal arteritis.  She states that she has had a cough and has had a hard time with the weather.  Has chronic allergic rhinitis and takes Flonase and Zyrtec for this.  She is requesting antibiotic for sinus infection.  No current facility-administered medications for this encounter.   Current Outpatient Medications:  .  ACCU-CHEK AVIVA PLUS test strip, USE TO CHECK BLOOD SUGAR 4 TIMES DAILY, Disp: , Rfl: 2 .  aspirin EC 81 MG tablet, Take 81 mg by mouth daily., Disp: , Rfl:  .  butalbital-acetaminophen-caffeine (FIORICET, ESGIC) 50-325-40 MG tablet, Take 1-2 tablets by mouth every 6 (six) hours as needed for headache., Disp: 20 tablet, Rfl: 0 .  Cholecalciferol (VITAMIN D3) 3000 units TABS, Take 3,000 Units by mouth daily., Disp: , Rfl:  .  cyclobenzaprine (FLEXERIL) 10 MG tablet, Take 1 tablet (10 mg total) by mouth 2 (two) times daily as needed for muscle spasms., Disp: 20 tablet, Rfl: 0 .  DULoxetine (CYMBALTA) 30 MG capsule, Take 30 mg by mouth daily., Disp: , Rfl:  .  Famotidine (PEPCID AC PO), Take by mouth as needed., Disp: , Rfl:  .  fluticasone (FLONASE) 50 MCG/ACT nasal spray, Place 2 sprays into both nostrils daily., Disp: 16 g, Rfl: 0 .  gabapentin (NEURONTIN) 100 MG capsule, Take 100-200 mg by mouth See admin instructions. Take 100 mg I the morning and 200 mg in the evening, Disp: , Rfl:  .  hydrOXYzine (ATARAX/VISTARIL) 25 MG tablet, Take 1 tablet (25 mg total) by mouth every 6 (six) hours as needed for anxiety., Disp: 20 tablet, Rfl: 0 .  insulin glargine (LANTUS) 100 UNIT/ML injection, Inject 50 Units into the skin at bedtime. , Disp: , Rfl:  .  losartan-hydrochlorothiazide (HYZAAR) 100-12.5 MG tablet, Take 1 tablet by mouth  daily. , Disp: , Rfl:  .  magnesium hydroxide (MILK OF MAGNESIA) 400 MG/5ML suspension, Take by mouth daily as needed for mild constipation., Disp: , Rfl:  .  metFORMIN (GLUCOPHAGE) 1000 MG tablet, Take 1,000 mg by mouth daily with breakfast. , Disp: , Rfl:  .  predniSONE (DELTASONE) 20 MG tablet, Take 1 tablet (20 mg total) by mouth 2 (two) times daily with a meal. (Patient taking differently: Take 20 mg by mouth 3 (three) times daily. ), Disp: 20 tablet, Rfl: 0 .  simvastatin (ZOCOR) 40 MG tablet, Take 40 mg by mouth every evening. Reported on 03/01/2016, Disp: , Rfl:  .  ibuprofen (ADVIL,MOTRIN) 800 MG tablet, Take 1 tablet (800 mg total) by mouth every 8 (eight) hours as needed., Disp: 20 tablet, Rfl: 0 .  sodium chloride (OCEAN) 0.65 % SOLN nasal spray, Place 1 spray into both nostrils as needed for congestion., Disp: 15 mL, Rfl: 0    Allergies  Allergen Reactions  . Flagyl [Metronidazole] Hives and Itching    Benadryl     Past Medical History:  Diagnosis Date  . Anxiety   . Arthritis   . Chronic headaches   . DDD (degenerative disc disease), cervical   . Fibromyalgia   . GERD (gastroesophageal reflux disease)   . History of colon polyps   . History of psychosis   . Hypercholesteremia   . Hypertension   . IBS (irritable  bowel syndrome)   . MDD (major depressive disorder)   . Right sided facial pain   . Seasonal allergic rhinitis   . Sinus congestion    12-24-2018 per pt no fever  . Type 2 diabetes mellitus treated with insulin ([place])   . Vitamin D deficiency   . Wears dentures    full upper,  partial lower  . Wears glasses      Past Surgical History:  Procedure Laterality Date  . ARTERY BIOPSY Right 12/28/2018   Procedure: RIGHT BIOPSY TEMPORAL ARTERY;  Surgeon: Kieth Brightly, Arta Bruce, MD;  Location: [facility];  Service: General;  Laterality: Right;  . MULTIPLE TOOTH EXTRACTIONS  04/ 2019    sedation  . TOTAL ABDOMINAL HYSTERECTOMY W/ BILATERAL  SALPINGOOPHORECTOMY  2009    ROS  Objective:   Vitals: BP 137/88 (BP Location: Right Arm)   Pulse 73   Temp 98.8 F (37.1 C) (Temporal)   SpO2 98%   Physical Exam Constitutional:      General: She is not in acute distress.    Appearance: Normal appearance. She is well-developed. She is not ill-appearing, toxic-appearing or diaphoretic.  HENT:     Head: Normocephalic and atraumatic.     Right Ear: Tympanic membrane and ear canal normal. No drainage or tenderness. No middle ear effusion. Tympanic membrane is not erythematous.     Left Ear: Tympanic membrane and ear canal normal. No drainage or tenderness.  No middle ear effusion. Tympanic membrane is not erythematous.     Nose: Rhinorrhea present. No nasal deformity, septal deviation, nasal tenderness, mucosal edema or congestion.     Mouth/Throat:     Mouth: Mucous membranes are moist. No oral lesions.     Pharynx: Oropharynx is clear. No pharyngeal swelling, oropharyngeal exudate, posterior oropharyngeal erythema or uvula swelling.     Tonsils: No tonsillar exudate or tonsillar abscesses.  Eyes:     Extraocular Movements: Extraocular movements intact.     Right eye: Normal extraocular motion.     Left eye: Normal extraocular motion.     Conjunctiva/sclera: Conjunctivae normal.     Pupils: Pupils are equal, round, and reactive to light.  Neck:     Musculoskeletal: Normal range of motion and neck supple.  Cardiovascular:     Rate and Rhythm: Normal rate and regular rhythm.     Pulses: Normal pulses.     Heart sounds: Normal heart sounds. No murmur. No friction rub. No gallop.   Pulmonary:     Effort: Pulmonary effort is normal. No respiratory distress.     Breath sounds: Normal breath sounds. No stridor. No wheezing, rhonchi or rales.  Lymphadenopathy:     Cervical: No cervical adenopathy.  Skin:    General: Skin is warm and dry.     Findings: No rash.  Neurological:     General: No focal deficit present.     Mental  Status: She is alert and oriented to person, place, and time.  Psychiatric:        Mood and Affect: Mood normal.        Behavior: Behavior normal.        Thought Content: Thought content normal.     Assessment and Plan :   Acute nonintractable headache, unspecified headache type  Scalp pain  Environmental allergies  Acute maxillary sinusitis, recurrence not specified  Patient was upset that I did not intend on prescribing her antibiotic for sinus infection.  She insisted on using this as opposed  to managing headaches which she has struggled with.  Eventually I agreed to prescribe her amoxicillin to address her concern about a sinus infection despite the fact that she has chronic headaches, seasonal allergic retinitis, fibromyalgia and is taking many medications to manage all of this.  She was also upset that I recommended she follow-up with her PCP stating that she sees them every 3 months.  Patient left the clinic without an altercation. Counseled patient on potential for adverse effects with medications prescribed today, patient verbalized understanding. ER and return-to-clinic precautions discussed, patient verbalized understanding.    [name], [place] 01/17/19 1546

## 2019-01-17 NOTE — ED Triage Notes (Signed)
Pt reports right sided head pain status post biopsy of the Temporal artery on January 21 of this year.  Pt states she is still having pain.

## 2019-01-17 NOTE — Discharge Instructions (Addendum)
You may take 500mg  Tylenol with every 6 hours for pain and inflammation. Make sure you hydrate well with water every day, drink at least 64 ounces daily. Sleep ~8 hours per night. Start an antihistamine like Zyrtec, Allegra or Claritin to address allergies as the source of your symptoms. Otherwise, f/u with your PCP for continued management of your headaches.

## 2019-05-19 ENCOUNTER — Ambulatory Visit (HOSPITAL_COMMUNITY)
Admission: EM | Admit: 2019-05-19 | Discharge: 2019-05-19 | Disposition: A | Discharge status: Home / Self Care | Payer: Medicaid Other | Attending: Family Medicine | Admitting: Family Medicine

## 2019-05-19 ENCOUNTER — Other Ambulatory Visit: Payer: Self-pay

## 2019-05-19 ENCOUNTER — Encounter (HOSPITAL_COMMUNITY): Payer: Self-pay

## 2019-05-19 DIAGNOSIS — J0110 Acute frontal sinusitis, unspecified: Secondary | ICD-10-CM

## 2019-05-19 MED ORDER — AMOXICILLIN-POT CLAVULANATE 875-125 MG PO TABS: 1.0000 | ORAL_TABLET | Freq: Two times a day (BID) | ORAL | 0 refills | Status: DC

## 2019-05-19 NOTE — ED Provider Notes (Signed)
Ellen Avery   [id number] 05/19/19 Arrival Time: 5400  ASSESSMENT & PLAN:  1. Acute frontal sinusitis, recurrence not specified     Meds ordered this encounter  Medications  . amoxicillin-clavulanate (AUGMENTIN) 875-125 MG tablet    Sig: Take 1 tablet by mouth every 12 (twelve) hours.    Dispense:  20 tablet    Refill:  0    Discussed typical duration of symptoms. OTC symptom care as needed. Ensure adequate fluid intake and rest.  Follow-up Information    Pavelock, Ralene Bathe, MD.   Specialty: Internal Medicine Why: Follow up if not feeling better over the next week. Contact information: [address] [place] [address] [phone]        [facility].   Specialty: Urgent Care Why: As needed. Contact information: [place] [phone]          Reviewed expectations re: course of current medical issues. Questions answered. Outlined signs and symptoms indicating need for more acute intervention. Patient verbalized understanding. After Visit Summary given.   SUBJECTIVE: History from: patient.  Ellen Avery is a 53 y.o. female who presents with complaint of nasal congestion, post-nasal drainage, and frontal sinus pain. Onset gradual, "at least a couple of weeks ago". Respiratory symptoms: occasional dry cough. Mild fatigue over the past couple of weeks. No body aches. Fever: absent. Overall normal PO intake without n/v. Known sick contacts: none known. No known COVID-19 exposure. "Really haven't gone anywhere". No specific or significant aggravating or alleviating factors reported. History of frequent sinus infections: "usually get them two or three times a year". OTC medications without relief.  Social History   Tobacco Use  Smoking Status Current Every Day Smoker  . Packs/day: 1.00  . Years: 35.00  . Pack years: 35.00  . Types: Cigarettes  Smokeless  Tobacco Never Used   ROS: As per HPI. All other systems negative.   OBJECTIVE:  Vitals:   05/19/19 1022  BP: (!) 118/92  Pulse: 78  Resp: 19  Temp: 98.7 F (37.1 C)  TempSrc: Oral  SpO2: 99%    General appearance: alert; no distress HEENT: nasal congestion; clear runny nose; throat irritation secondary to post-nasal drainage; frontal tenderness to palpation; turbinates boggy Neck: supple without LAD; trachea midline CV: RRR Lungs: unlabored respirations, symmetrical air entry; cough: absent while here; no respiratory distress Skin: warm and dry Ext: no LE edema Psychological: alert and cooperative; normal mood and affect  Allergies  Allergen Reactions  . Flagyl [Metronidazole] Hives and Itching    Benadryl     Past Medical History:  Diagnosis Date  . Anxiety   . Arthritis   . Chronic headaches   . DDD (degenerative disc disease), cervical   . Fibromyalgia   . GERD (gastroesophageal reflux disease)   . History of colon polyps   . History of psychosis   . Hypercholesteremia   . Hypertension   . IBS (irritable bowel syndrome)   . MDD (major depressive disorder)   . Right sided facial pain   . Seasonal allergic rhinitis   . Sinus congestion    12-24-2018 per pt no fever  . Type 2 diabetes mellitus treated with insulin ([place])   . Vitamin D deficiency   . Wears dentures    full upper,  partial lower  . Wears glasses    Family History  Problem Relation Age of Onset  . Diabetes Mother   .  Heart disease Paternal Grandmother   . Diabetes Paternal Uncle   . Colon cancer Neg Hx   . Colon polyps Neg Hx   . Esophageal cancer Neg Hx   . Rectal cancer Neg Hx   . Stomach cancer Neg Hx               Vanessa Kick, MD 05/19/19 1106

## 2019-05-19 NOTE — ED Triage Notes (Signed)
Patient presents to Urgent Care with complaints of nasal congestion, productive cough, and generalized fatigue since a month ago. Patient reports she has not had a fever and has not been around anyone else sick, pt has been trying different OTC meds for her symptoms with no lasting relief.

## 2019-07-07 ENCOUNTER — Other Ambulatory Visit: Payer: Self-pay | Admitting: Specialist

## 2019-07-07 DIAGNOSIS — Z1231 Encounter for screening mammogram for malignant neoplasm of breast: Secondary | ICD-10-CM

## 2019-07-10 ENCOUNTER — Encounter (HOSPITAL_COMMUNITY): Payer: Self-pay | Admitting: Emergency Medicine

## 2019-07-10 ENCOUNTER — Emergency Department (HOSPITAL_COMMUNITY)
Admission: EM | Admit: 2019-07-10 | Discharge: 2019-07-10 | Discharge status: Against Medical Advice | Payer: Medicaid Other | Attending: Emergency Medicine | Admitting: Emergency Medicine

## 2019-07-10 ENCOUNTER — Emergency Department (HOSPITAL_COMMUNITY): Payer: Medicaid Other

## 2019-07-10 DIAGNOSIS — Z79899 Other long term (current) drug therapy: Secondary | ICD-10-CM | POA: Diagnosis not present

## 2019-07-10 DIAGNOSIS — E119 Type 2 diabetes mellitus without complications: Secondary | ICD-10-CM | POA: Insufficient documentation

## 2019-07-10 DIAGNOSIS — E669 Obesity, unspecified: Secondary | ICD-10-CM | POA: Insufficient documentation

## 2019-07-10 DIAGNOSIS — R51 Headache: Secondary | ICD-10-CM | POA: Insufficient documentation

## 2019-07-10 DIAGNOSIS — R0602 Shortness of breath: Secondary | ICD-10-CM | POA: Insufficient documentation

## 2019-07-10 DIAGNOSIS — Z794 Long term (current) use of insulin: Secondary | ICD-10-CM | POA: Diagnosis not present

## 2019-07-10 DIAGNOSIS — Z7982 Long term (current) use of aspirin: Secondary | ICD-10-CM | POA: Insufficient documentation

## 2019-07-10 DIAGNOSIS — I10 Essential (primary) hypertension: Secondary | ICD-10-CM | POA: Insufficient documentation

## 2019-07-10 DIAGNOSIS — R0789 Other chest pain: Secondary | ICD-10-CM | POA: Insufficient documentation

## 2019-07-10 DIAGNOSIS — F17210 Nicotine dependence, cigarettes, uncomplicated: Secondary | ICD-10-CM | POA: Diagnosis not present

## 2019-07-10 DIAGNOSIS — R110 Nausea: Secondary | ICD-10-CM | POA: Insufficient documentation

## 2019-07-10 DIAGNOSIS — R079 Chest pain, unspecified: Secondary | ICD-10-CM

## 2019-07-10 LAB — CBC WITH DIFFERENTIAL/PLATELET
Abs Immature Granulocytes: 0.03 10*3/uL (ref 0.00–0.07)
Basophils Absolute: 0.1 10*3/uL (ref 0.0–0.1)
Basophils Relative: 1 %
Eosinophils Absolute: 0.1 10*3/uL (ref 0.0–0.5)
Eosinophils Relative: 1 %
HCT: 41 % (ref 36.0–46.0)
Hemoglobin: 13.3 g/dL (ref 12.0–15.0)
Immature Granulocytes: 0 %
Lymphocytes Relative: 32 %
Lymphs Abs: 3 10*3/uL (ref 0.7–4.0)
MCH: 27.4 pg (ref 26.0–34.0)
MCHC: 32.4 g/dL (ref 30.0–36.0)
MCV: 84.5 fL (ref 80.0–100.0)
Monocytes Absolute: 0.6 10*3/uL (ref 0.1–1.0)
Monocytes Relative: 6 %
Neutro Abs: 5.8 10*3/uL (ref 1.7–7.7)
Neutrophils Relative %: 60 %
Platelets: 344 10*3/uL (ref 150–400)
RBC: 4.85 MIL/uL (ref 3.87–5.11)
RDW: 12.5 % (ref 11.5–15.5)
WBC: 9.6 10*3/uL (ref 4.0–10.5)
nRBC: 0 % (ref 0.0–0.2)

## 2019-07-10 LAB — COMPREHENSIVE METABOLIC PANEL
ALT: 15 U/L (ref 0–44)
AST: 13 U/L — ABNORMAL LOW (ref 15–41)
Albumin: 3.4 g/dL — ABNORMAL LOW (ref 3.5–5.0)
Alkaline Phosphatase: 92 U/L (ref 38–126)
Anion gap: 8 (ref 5–15)
BUN: 11 mg/dL (ref 6–20)
CO2: 26 mmol/L (ref 22–32)
Calcium: 9.3 mg/dL (ref 8.9–10.3)
Chloride: 100 mmol/L (ref 98–111)
Creatinine, Ser: 0.78 mg/dL (ref 0.44–1.00)
GFR calc Af Amer: >60 mL/min (ref >60)
GFR calc non Af Amer: >60 mL/min (ref >60)
Glucose, Bld: 406 mg/dL — ABNORMAL HIGH (ref 70–99)
Potassium: 3.7 mmol/L (ref 3.5–5.1)
Sodium: 134 mmol/L — ABNORMAL LOW (ref 135–145)
Total Bilirubin: 0.4 mg/dL (ref 0.3–1.2)
Total Protein: 6.6 g/dL (ref 6.5–8.1)

## 2019-07-10 LAB — LIPASE, BLOOD: Lipase: 85 U/L — ABNORMAL HIGH (ref 11–51)

## 2019-07-10 LAB — CBG MONITORING, ED: Glucose-Capillary: 385 mg/dL — ABNORMAL HIGH (ref 70–99)

## 2019-07-10 LAB — TROPONIN I (HIGH SENSITIVITY)
Troponin I (High Sensitivity): 15 ng/L (ref <18)
Troponin I (High Sensitivity): 5 ng/L (ref <18)

## 2019-07-10 LAB — D-DIMER, QUANTITATIVE: D-Dimer, Quant: 0.56 ug/mL-FEU — ABNORMAL HIGH (ref 0.00–0.50)

## 2019-07-10 MED ORDER — NITROGLYCERIN 0.4 MG SL SUBL: 0.4000 mg | SUBLINGUAL_TABLET | Freq: Once | SUBLINGUAL | Status: DC

## 2019-07-10 NOTE — ED Notes (Signed)
Patient verbalizes understanding of discharge instructions. Opportunity for questioning and answers were provided. Armband removed by staff, pt discharged from ED.  

## 2019-07-10 NOTE — ED Notes (Signed)
Pt has steady gait  Walked to the br

## 2019-07-10 NOTE — ED Notes (Signed)
Given 324 ASA en route

## 2019-07-10 NOTE — Discharge Instructions (Addendum)
It was recommended you stay for further evaluation of your chest pain but you declined.  Follow-up with your doctor as well as the cardiologist.  Return to the ED if you wish to be reevaluated.

## 2019-07-10 NOTE — ED Triage Notes (Signed)
BIB EMS from home. Reports HA/neck pain - chronic in nature. Also reports sudden onset CP PTA. Central/non radiating, tight/pressure. CBG 470's with EMS, states trouble with controlling sugars lately. VSS.

## 2019-07-10 NOTE — ED Provider Notes (Signed)
[facility] EMERGENCY DEPARTMENT Provider Note   CSN: [id number] Arrival date & time: 07/10/19  0215     History   Chief Complaint Chief Complaint  Patient presents with  . Multiple Complaints    HPI Ellen Avery is a 53 y.o. female.     Patient presents by EMS with episode of central chest pain onset 45 minutes prior to arrival.  She was sitting at rest when this occurred.  She describes pain in the center of her chest that radiates to both shoulders described as a pressure and "something sitting on my chest".  Endorses some shortness of breath and nausea but no vomiting, cough or fever.  She has not had this kind of pain in the past.  Denies any cardiac history.  Does have a history of diabetes and hypertension.  States she had a stress test maybe 5 years ago.  She was given aspirin by EMS but no nitroglycerin.  She reports the pain is starting to subside.  There is no radiation to her back or abdomen.  She has neck pain which is chronic for her due to history of spondylosis.  She is having intermittent gradual onset headache for the past several days as well no focal neurological deficits.  No photophobia, fever, chills, vomiting.  No new focal weakness, numbness or tingling.  No bowel or bladder incontinence.  The history is provided by the patient and the EMS personnel.    Past Medical History:  Diagnosis Date  . Anxiety   . Arthritis   . Chronic headaches   . DDD (degenerative disc disease), cervical   . Fibromyalgia   . GERD (gastroesophageal reflux disease)   . History of colon polyps   . History of psychosis   . Hypercholesteremia   . Hypertension   . IBS (irritable bowel syndrome)   . MDD (major depressive disorder)   . Right sided facial pain   . Seasonal allergic rhinitis   . Sinus congestion    12-24-2018 per pt no fever  . Type 2 diabetes mellitus treated with insulin ([place])   . Vitamin D deficiency   . Wears dentures    full upper,   partial lower  . Wears glasses     Patient Active Problem List   Diagnosis Date Noted  . IDDM (insulin dependent diabetes mellitus) ([place]) 07/08/2018  . HTN (hypertension) 07/08/2018  . HLD (hyperlipidemia) 07/08/2018  . Altered mental status 03/02/2016  . MDD (major depressive disorder), recurrent episode, moderate ([place]) 03/02/2016  . Psychoses [facility])     Past Surgical History:  Procedure Laterality Date  . ARTERY BIOPSY Right 12/28/2018   Procedure: RIGHT BIOPSY TEMPORAL ARTERY;  Surgeon: Kieth Brightly, Arta Bruce, MD;  Location: [facility];  Service: General;  Laterality: Right;  . MULTIPLE TOOTH EXTRACTIONS  04/ 2019    sedation  . TOTAL ABDOMINAL HYSTERECTOMY W/ BILATERAL SALPINGOOPHORECTOMY  2009     OB History   No obstetric history on file.      Home Medications    Prior to Admission medications   Medication Sig Start Date End Date Taking? Authorizing Provider  ACCU-CHEK AVIVA PLUS test strip USE TO CHECK BLOOD SUGAR 4 TIMES DAILY 05/25/18   [provider]  amoxicillin-clavulanate (AUGMENTIN) 875-125 MG tablet Take 1 tablet by mouth every 12 (twelve) hours. 05/19/19   Vanessa Kick, MD  aspirin EC 81 MG tablet Take 81 mg by mouth daily.    [provider]  butalbital-acetaminophen-caffeine (FIORICET, ESGIC) 50-325-40 MG tablet Take 1-2 tablets by mouth every 6 (six) hours as needed for headache. 12/22/18 12/22/19  Domenic Moras, PA-C  Cholecalciferol (VITAMIN D3) 3000 units TABS Take 3,000 Units by mouth daily.    [provider]  cyclobenzaprine (FLEXERIL) 10 MG tablet Take 1 tablet (10 mg total) by mouth 2 (two) times daily as needed for muscle spasms. 08/30/18   Law, [name], PA-C  DULoxetine (CYMBALTA) 30 MG capsule Take 30 mg by mouth daily. 12/07/18   [provider]  Famotidine (PEPCID AC PO) Take by mouth as needed.    [provider]  fluticasone (FLONASE) 50 MCG/ACT nasal spray Place 2 sprays into both  nostrils daily. 12/07/18   Couture, Cortni S, PA-C  gabapentin (NEURONTIN) 100 MG capsule Take 100-200 mg by mouth See admin instructions. Take 100 mg I the morning and 200 mg in the evening 11/01/18   [provider]  hydrOXYzine (ATARAX/VISTARIL) 25 MG tablet Take 1 tablet (25 mg total) by mouth every 6 (six) hours as needed for anxiety. 09/13/18   [name], [name], MD  ibuprofen (ADVIL,MOTRIN) 800 MG tablet Take 1 tablet (800 mg total) by mouth every 8 (eight) hours as needed. 12/28/18   Kinsinger, Arta Bruce, MD  insulin glargine (LANTUS) 100 UNIT/ML injection Inject 50 Units into the skin at bedtime.     [provider]  losartan-hydrochlorothiazide (HYZAAR) 100-12.5 MG tablet Take 1 tablet by mouth daily.  11/01/18   [provider]  magnesium hydroxide (MILK OF MAGNESIA) 400 MG/5ML suspension Take by mouth daily as needed for mild constipation.    [provider]  metFORMIN (GLUCOPHAGE) 1000 MG tablet Take 1,000 mg by mouth daily with breakfast.     [provider]  simvastatin (ZOCOR) 40 MG tablet Take 40 mg by mouth every evening. Reported on 03/01/2016    [provider]  sodium chloride (OCEAN) 0.65 % SOLN nasal spray Place 1 spray into both nostrils as needed for congestion. 08/30/18   Frederica Kuster, PA-C    Family History Family History  Problem Relation Age of Onset  . Diabetes Mother   . Heart disease Paternal Grandmother   . Diabetes Paternal Uncle   . Colon cancer Neg Hx   . Colon polyps Neg Hx   . Esophageal cancer Neg Hx   . Rectal cancer Neg Hx   . Stomach cancer Neg Hx     Social History Social History   Tobacco Use  . Smoking status: Current Every Day Smoker    Packs/day: 1.00    Years: 35.00    Pack years: 35.00    Types: Cigarettes  . Smokeless tobacco: Never Used  Substance Use Topics  . Alcohol use: No  . Drug use: No     Allergies   Flagyl [metronidazole]   Review of Systems Review of  Systems  Constitutional: Negative for activity change, appetite change and fever.  HENT: Negative for congestion and rhinorrhea.   Respiratory: Positive for chest tightness and shortness of breath.   Cardiovascular: Positive for chest pain.  Gastrointestinal: Positive for nausea. Negative for abdominal pain and vomiting.  Genitourinary: Negative for dysuria, hematuria and vaginal bleeding.  Musculoskeletal: Positive for arthralgias, myalgias and neck pain.  Skin: Negative for rash.  Neurological: Negative for dizziness, weakness and headaches.    all other systems are negative except as noted in the HPI and PMH.    Physical Exam Updated Vital Signs BP 116/79  Pulse 89   Temp 98.5 F (36.9 C) (Oral)   Resp 17   SpO2 96%   Physical Exam Vitals signs and nursing note reviewed.  Constitutional:      General: She is not in acute distress.    Appearance: She is well-developed. She is obese.  HENT:     Head: Normocephalic and atraumatic.     Mouth/Throat:     Pharynx: No oropharyngeal exudate.  Eyes:     Conjunctiva/sclera: Conjunctivae normal.     Pupils: Pupils are equal, round, and reactive to light.  Neck:     Musculoskeletal: Normal range of motion and neck supple.     Comments: No meningismus. Cardiovascular:     Rate and Rhythm: Normal rate and regular rhythm.     Heart sounds: Normal heart sounds. No murmur.  Pulmonary:     Effort: Pulmonary effort is normal. No respiratory distress.     Breath sounds: Normal breath sounds.  Chest:     Chest wall: No tenderness.  Abdominal:     Palpations: Abdomen is soft.     Tenderness: There is no abdominal tenderness. There is no guarding or rebound.  Musculoskeletal: Normal range of motion.        General: No tenderness.  Skin:    General: Skin is warm.     Capillary Refill: Capillary refill takes less than 2 seconds.  Neurological:     General: No focal deficit present.     Mental Status: She is alert and oriented to  person, place, and time. Mental status is at baseline.     Cranial Nerves: No cranial nerve deficit.     Motor: No abnormal muscle tone.     Coordination: Coordination normal.     Comments: No ataxia on finger to nose bilaterally. No pronator drift. 5/5 strength throughout. CN 2-12 intact.Equal grip strength. Sensation intact.   Psychiatric:        Behavior: Behavior normal.      ED Treatments / Results  Labs (all labs ordered are listed, but only abnormal results are displayed) Labs Reviewed  COMPREHENSIVE METABOLIC PANEL - Abnormal; Notable for the following components:      Result Value   Sodium 134 (*)    Glucose, Bld 406 (*)    Albumin 3.4 (*)    AST 13 (*)    All other components within normal limits  LIPASE, BLOOD - Abnormal; Notable for the following components:   Lipase 85 (*)    All other components within normal limits  D-DIMER, QUANTITATIVE (NOT AT [facility]) - Abnormal; Notable for the following components:   D-Dimer, Quant 0.56 (*)    All other components within normal limits  CBG MONITORING, ED - Abnormal; Notable for the following components:   Glucose-Capillary 385 (*)    All other components within normal limits  CBC WITH DIFFERENTIAL/PLATELET  TROPONIN I (HIGH SENSITIVITY)  TROPONIN I (HIGH SENSITIVITY)    EKG EKG Interpretation  Date/Time:  Sunday July 10 2019 02:22:01 EDT Ventricular Rate:  95 PR Interval:    QRS Duration: 89 QT Interval:  384 QTC Calculation: 483 R Axis:   76 Text Interpretation:  Sinus rhythm Borderline repolarization abnormality Baseline wander in lead(s) V1 Nonspecific ST abnormality inferiorly  Confirmed by Ezequiel Essex [phone]) on 07/10/2019 2:24:46 AM   Radiology Dg Chest Portable 1 View  Result Date: 07/10/2019 CLINICAL DATA:  Initial evaluation for acute chest pain. EXAM: PORTABLE CHEST 1 VIEW COMPARISON:  Prior radiograph from 12/11/2018.  FINDINGS: The cardiac and mediastinal silhouettes are stable in size and contour,  and remain within normal limits. The lungs are normally inflated. No airspace consolidation, pleural effusion, or pulmonary edema is identified. There is no pneumothorax. No acute osseous abnormality identified. IMPRESSION: No radiographic evidence for active cardiopulmonary disease. Electronically Signed   By: Jeannine Boga M.D.   On: 07/10/2019 02:58    Procedures Procedures (including critical care time)  Medications Ordered in ED Medications  nitroGLYCERIN (NITROSTAT) SL tablet 0.4 mg (has no administration in time range)     Initial Impression / Assessment and Plan / ED Course  I have reviewed the triage vital signs and the nursing notes.  Pertinent labs & imaging results that were available during my care of the patient were reviewed by me and considered in my medical decision making (see chart for details).        Acute onset of central chest pain going to both shoulders.  EKG has nonspecific changes inferiorly.  Patient given aspirin as well as nitroglycerin  Troponin negative.  Patient is chest pain-free after nitroglycerin.  Her chest x-ray is negative. Labs show hyperglycemia without DKA.  Lipase 85 but no abdominal pain.  Heart score is 4.  She does have nonspecific changes inferiorly on her EKG Recommended observation admission with patient is hesitant to agree to.  She does agree to second troponin which was negative.  D-dimer slightly elevated at .56 but low suspicion for PE.  Patient does not want to be admitted. She wants to go home.  She understands that she could be at risk for having a heart attack which can be life-threatening. She is not willing to stay for admission or CTPE study.  She appears to have capacity to make medical decisions and will leave [facility].  She understands she is free to return at any time and is given cardiology referral. Final Clinical Impressions(s) / ED Diagnoses   Final diagnoses:  Chest pain, unspecified  type    ED Discharge Orders    None       Bayleigh Loflin, Annie Main, MD 07/10/19 [phone]

## 2019-07-17 ENCOUNTER — Encounter (HOSPITAL_COMMUNITY): Payer: Self-pay | Admitting: *Deleted

## 2019-07-17 ENCOUNTER — Emergency Department (HOSPITAL_COMMUNITY): Payer: Medicaid Other

## 2019-07-17 ENCOUNTER — Other Ambulatory Visit: Payer: Self-pay

## 2019-07-17 ENCOUNTER — Emergency Department (HOSPITAL_COMMUNITY)
Admission: EM | Admit: 2019-07-17 | Discharge: 2019-07-17 | Disposition: A | Discharge status: Home / Self Care | Payer: Medicaid Other | Attending: Emergency Medicine | Admitting: Emergency Medicine

## 2019-07-17 DIAGNOSIS — E119 Type 2 diabetes mellitus without complications: Secondary | ICD-10-CM | POA: Insufficient documentation

## 2019-07-17 DIAGNOSIS — E782 Mixed hyperlipidemia: Secondary | ICD-10-CM | POA: Insufficient documentation

## 2019-07-17 DIAGNOSIS — Z79899 Other long term (current) drug therapy: Secondary | ICD-10-CM | POA: Diagnosis not present

## 2019-07-17 DIAGNOSIS — R0789 Other chest pain: Secondary | ICD-10-CM | POA: Diagnosis present

## 2019-07-17 DIAGNOSIS — Z794 Long term (current) use of insulin: Secondary | ICD-10-CM | POA: Diagnosis not present

## 2019-07-17 DIAGNOSIS — F17210 Nicotine dependence, cigarettes, uncomplicated: Secondary | ICD-10-CM | POA: Diagnosis not present

## 2019-07-17 DIAGNOSIS — I10 Essential (primary) hypertension: Secondary | ICD-10-CM | POA: Diagnosis not present

## 2019-07-17 DIAGNOSIS — R079 Chest pain, unspecified: Secondary | ICD-10-CM

## 2019-07-17 DIAGNOSIS — Z881 Allergy status to other antibiotic agents status: Secondary | ICD-10-CM | POA: Insufficient documentation

## 2019-07-17 LAB — CBC
HCT: 39.4 % (ref 36.0–46.0)
Hemoglobin: 13 g/dL (ref 12.0–15.0)
MCH: 28 pg (ref 26.0–34.0)
MCHC: 33 g/dL (ref 30.0–36.0)
MCV: 84.7 fL (ref 80.0–100.0)
Platelets: 362 10*3/uL (ref 150–400)
RBC: 4.65 MIL/uL (ref 3.87–5.11)
RDW: 12.3 % (ref 11.5–15.5)
WBC: 8.5 10*3/uL (ref 4.0–10.5)
nRBC: 0 % (ref 0.0–0.2)

## 2019-07-17 LAB — TROPONIN I (HIGH SENSITIVITY)
Troponin I (High Sensitivity): 4 ng/L (ref <18)
Troponin I (High Sensitivity): 3 ng/L (ref <18)

## 2019-07-17 LAB — BASIC METABOLIC PANEL
Anion gap: 12 (ref 5–15)
BUN: 7 mg/dL (ref 6–20)
CO2: 25 mmol/L (ref 22–32)
Calcium: 9.2 mg/dL (ref 8.9–10.3)
Chloride: 98 mmol/L (ref 98–111)
Creatinine, Ser: 0.71 mg/dL (ref 0.44–1.00)
GFR calc Af Amer: >60 mL/min (ref >60)
GFR calc non Af Amer: >60 mL/min (ref >60)
Glucose, Bld: 318 mg/dL — ABNORMAL HIGH (ref 70–99)
Potassium: 3.9 mmol/L (ref 3.5–5.1)
Sodium: 135 mmol/L (ref 135–145)

## 2019-07-17 LAB — I-STAT BETA HCG BLOOD, ED (MC, WL, AP ONLY): I-stat hCG, quantitative: <5 m[IU]/mL (ref <5)

## 2019-07-17 MED ORDER — IOHEXOL 350 MG/ML SOLN
75.0000 mL | Freq: Once | INTRAVENOUS | Status: AC | PRN
  Administered 2019-07-17: 08:00:00 75 mL via INTRAVENOUS

## 2019-07-17 MED ORDER — NITROGLYCERIN 0.4 MG SL SUBL: 0.4000 mg | SUBLINGUAL_TABLET | SUBLINGUAL | Status: DC | PRN

## 2019-07-17 MED ORDER — HYDROCODONE-ACETAMINOPHEN 5-325 MG PO TABS: 1.0000 | ORAL_TABLET | Freq: Four times a day (QID) | ORAL | 0 refills | Status: DC | PRN

## 2019-07-17 MED ORDER — MORPHINE SULFATE (PF) 4 MG/ML IV SOLN
4.0000 mg | Freq: Once | INTRAVENOUS | Status: AC
  Administered 2019-07-17: 4 mg via INTRAVENOUS
  Filled 2019-07-17: qty 1

## 2019-07-17 MED ORDER — SODIUM CHLORIDE 0.9% FLUSH
3.0000 mL | Freq: Once | INTRAVENOUS | Status: AC
  Administered 2019-07-17: 3 mL via INTRAVENOUS

## 2019-07-17 NOTE — ED Provider Notes (Signed)
[facility] DEPART[facility] Provider Note   CSN: [id number] Arrival date & time: 07/17/19  0606    History   Chief Complaint Chief Complaint  Patient presents with   Chest Pain    HPI Ellen Avery is a 53 y.o. female with history of hypertension, hypercholesterolemia, diabetes, obesity who presents with a one-week history of intermittent central chest pain.  The pain has been present on exertion and at rest.  It radiates to both arms.  Patient denies any significant shortness of breath or diaphoresis with it.  She denies any nausea, vomiting, abdominal pain, fever, cough.  It is not pleuritic.  She describes it as a heaviness and sharp feeling.  Patient has a 37-pack-year smoking history.  Patient was seen on 07/10/2019 and was recommended observation admission, however patient left AMA with plans to follow-up with cardiology.  She continues to have symptoms.  Patient denies any recent long trips, surgeries, known cancer, new leg pain or swelling, history of blood clots. Given aspirin PTA by EMS, no nitroglycerin.     HPI  Past Medical History:  Diagnosis Date   Anxiety    Arthritis    Chronic headaches    DDD (degenerative disc disease), cervical    Fibromyalgia    GERD (gastroesophageal reflux disease)    History of colon polyps    History of psychosis    Hypercholesteremia    Hypertension    IBS (irritable bowel syndrome)    MDD (major depressive disorder)    Right sided facial pain    Seasonal allergic rhinitis    Sinus congestion    12-24-2018 per pt no fever   Type 2 diabetes mellitus treated with insulin ([place])    Vitamin D deficiency    Wears dentures    full upper,  partial lower   Wears glasses     Patient Active Problem List   Diagnosis Date Noted   IDDM (insulin dependent diabetes mellitus) ([place]) 07/08/2018   HTN (hypertension) 07/08/2018   HLD (hyperlipidemia) 07/08/2018   Altered mental status 03/02/2016     MDD (major depressive disorder), recurrent episode, moderate ([place]) 03/02/2016   Psychoses ([place])     Past Surgical History:  Procedure Laterality Date   ARTERY BIOPSY Right 12/28/2018   Procedure: RIGHT BIOPSY TEMPORAL ARTERY;  Surgeon: Kieth Brightly, Arta Bruce, MD;  Location: [facility];  Service: General;  Laterality: Right;   MULTIPLE TOOTH EXTRACTIONS  04/ 2019    sedation   TOTAL ABDOMINAL HYSTERECTOMY W/ BILATERAL SALPINGOOPHORECTOMY  2009     OB History   No obstetric history on file.      Home Medications    Prior to Admission medications   Medication Sig Start Date End Date Taking? Authorizing Provider  butalbital-acetaminophen-caffeine (FIORICET, ESGIC) [phone] MG tablet Take 1-2 tablets by mouth every 6 (six) hours as needed for headache. 12/22/18 12/22/19 Yes Domenic Moras, PA-C  cyclobenzaprine (FLEXERIL) 10 MG tablet Take 1 tablet (10 mg total) by mouth 2 (two) times daily as needed for muscle spasms. 08/30/18  Yes Md Smola, [name], PA-C  hydrOXYzine (ATARAX/VISTARIL) 25 MG tablet Take 1 tablet (25 mg total) by mouth every 6 (six) hours as needed for anxiety. 09/13/18  Yes [name], [name], MD  ibuprofen (ADVIL,MOTRIN) 800 MG tablet Take 1 tablet (800 mg total) by mouth every 8 (eight) hours as needed. 12/28/18  Yes Kinsinger, Arta Bruce, MD  JANUVIA 50 MG tablet Take 50 mg by mouth daily. 07/07/19  Yes [provider]  magnesium hydroxide (MILK OF MAGNESIA) 400 MG/5ML suspension Take by mouth daily as needed for mild constipation.   Yes [provider]  sodium chloride (OCEAN) 0.65 % SOLN nasal spray Place 1 spray into both nostrils as needed for congestion. 08/30/18  Yes Carvin Almas, [name], PA-C  ACCU-CHEK AVIVA PLUS test strip USE TO CHECK BLOOD SUGAR 4 TIMES DAILY 05/25/18   [provider]  amoxicillin-clavulanate (AUGMENTIN) 875-125 MG tablet Take 1 tablet by mouth every 12 (twelve) hours. 05/19/19   Vanessa Kick, MD  aspirin EC  81 MG tablet Take 81 mg by mouth daily.    [provider]  Cholecalciferol (VITAMIN D3) 3000 units TABS Take 3,000 Units by mouth daily.    [provider]  DULoxetine (CYMBALTA) 60 MG capsule Take 60 mg by mouth daily.  12/07/18   [provider]  Famotidine (PEPCID AC PO) Take by mouth as needed.    [provider]  fluticasone (FLONASE) 50 MCG/ACT nasal spray Place 2 sprays into both nostrils daily. 12/07/18   Couture, Cortni S, PA-C  gabapentin (NEURONTIN) 100 MG capsule Take 100-200 mg by mouth See admin instructions. Take 100 mg I the morning and 200 mg in the evening 11/01/18   [provider]  HYDROcodone-acetaminophen (NORCO/VICODIN) 5-325 MG tablet Take 1 tablet by mouth every 6 (six) hours as needed for severe pain. 07/17/19   Noela Brothers, [name], PA-C  insulin glargine (LANTUS) 100 UNIT/ML injection Inject 50 Units into the skin at bedtime.     [provider]  losartan-hydrochlorothiazide (HYZAAR) 100-12.5 MG tablet Take 1 tablet by mouth daily.  11/01/18   [provider]  metFORMIN (GLUCOPHAGE) 1000 MG tablet Take 1,000 mg by mouth daily with breakfast.     [provider]  simvastatin (ZOCOR) 40 MG tablet Take 40 mg by mouth every evening. Reported on 03/01/2016    [provider]    Family History Family History  Problem Relation Age of Onset   Diabetes Mother    Heart disease Paternal Grandmother    Diabetes Paternal Uncle    Colon cancer Neg Hx    Colon polyps Neg Hx    Esophageal cancer Neg Hx    Rectal cancer Neg Hx    Stomach cancer Neg Hx     Social History Social History   Tobacco Use   Smoking status: Current Every Day Smoker    Packs/day: 1.00    Years: 35.00    Pack years: 35.00    Types: Cigarettes   Smokeless tobacco: Never Used  Substance Use Topics   Alcohol use: No   Drug use: No     Allergies   Flagyl [metronidazole]   Review of Systems Review of  Systems  Constitutional: Negative for chills and fever.  HENT: Negative for facial swelling and sore throat.   Respiratory: Negative for shortness of breath.   Cardiovascular: Positive for chest pain.  Gastrointestinal: Negative for abdominal pain, nausea and vomiting.  Genitourinary: Negative for dysuria.  Musculoskeletal: Negative for back pain.  Skin: Negative for rash and wound.  Neurological: Negative for headaches.  Psychiatric/Behavioral: The patient is not nervous/anxious.      Physical Exam Updated Vital Signs BP 108/76 (BP Location: Right Arm)    Pulse 77    Temp 99 F (37.2 C) (Oral)    Resp 16    Ht 5\' 7"  (1.702 m)    Wt 108.9 kg    SpO2 100%  BMI 37.59 kg/m   Physical Exam Vitals signs and nursing note reviewed.  Constitutional:      General: She is not in acute distress.    Appearance: She is well-developed. She is not diaphoretic.  HENT:     Head: Normocephalic and atraumatic.     Mouth/Throat:     Pharynx: No oropharyngeal exudate.  Eyes:     General: No scleral icterus.       Right eye: No discharge.        Left eye: No discharge.     Conjunctiva/sclera: Conjunctivae normal.     Pupils: Pupils are equal, round, and reactive to light.  Neck:     Musculoskeletal: Normal range of motion and neck supple.     Thyroid: No thyromegaly.  Cardiovascular:     Rate and Rhythm: Normal rate and regular rhythm.     Heart sounds: Normal heart sounds. No murmur. No friction rub. No gallop.   Pulmonary:     Effort: Pulmonary effort is normal. No respiratory distress.     Breath sounds: Normal breath sounds. No stridor. No wheezing or rales.  Chest:     Chest wall: No tenderness.  Abdominal:     General: Bowel sounds are normal. There is no distension.     Palpations: Abdomen is soft.     Tenderness: There is no abdominal tenderness. There is no guarding or rebound.  Musculoskeletal:     Right lower leg: She exhibits no tenderness. No edema.     Left lower leg:  She exhibits no tenderness. No edema.  Lymphadenopathy:     Cervical: No cervical adenopathy.  Skin:    General: Skin is warm and dry.     Coloration: Skin is not pale.     Findings: No rash.  Neurological:     Mental Status: She is alert.     Coordination: Coordination normal.      ED Treatments / Results  Labs (all labs ordered are listed, but only abnormal results are displayed) Labs Reviewed  BASIC METABOLIC PANEL - Abnormal; Notable for the following components:      Result Value   Glucose, Bld 318 (*)    All other components within normal limits  CBC  I-STAT BETA HCG BLOOD, ED ([facility], [facility], [facility] ONLY)  TROPONIN I (HIGH SENSITIVITY)  TROPONIN I (HIGH SENSITIVITY)    EKG None  Radiology Dg Chest 2 View  Result Date: 07/17/2019 CLINICAL DATA:  Chest pain EXAM: CHEST - 2 VIEW COMPARISON:  07/10/2019 chest radiograph. FINDINGS: Stable cardiomediastinal silhouette with normal heart size. No pneumothorax. No pleural effusion. No pulmonary edema. No acute consolidative airspace disease. Nodular 1.5 cm opacity overlies the retrosternal clear space on the lateral view without definite correlate on the PA view. IMPRESSION: Nodular opacity in the retrosternal clear space on the lateral view without definite correlate on the PA view, cannot exclude pulmonary nodule. Recommend further evaluation with chest CT with IV contrast. Electronically Signed   By: Ilona Sorrel M.D.   On: 07/17/2019 06:46   Ct Angio Chest Pe W And/or Wo Contrast  Result Date: 07/17/2019 CLINICAL DATA:  Possible pulmonary nodule on chest radiograph. Chest pain. Elevated D-dimer. EXAM: CT ANGIOGRAPHY CHEST WITH CONTRAST TECHNIQUE: Multidetector CT imaging of the chest was performed using the standard protocol during bolus administration of intravenous contrast. Multiplanar CT image reconstructions and MIPs were obtained to evaluate the vascular anatomy. CONTRAST:  32mL OMNIPAQUE IOHEXOL 350 MG/ML SOLN COMPARISON:  Chest  radiograph  from earlier today. FINDINGS: Cardiovascular: The study is high quality for the evaluation of pulmonary embolism. There are no filling defects in the central, lobar, segmental or subsegmental pulmonary artery branches to suggest acute pulmonary embolism. Great vessels are normal in course and caliber. Normal heart size. No significant pericardial fluid/thickening. Mediastinum/Nodes: No discrete thyroid nodules. Unremarkable esophagus. No pathologically enlarged axillary, mediastinal or hilar lymph nodes. Lungs/Pleura: No pneumothorax. No pleural effusion. No acute consolidative airspace disease or lung masses. Solid 1.6 cm medial right upper lobe pulmonary nodule (series 7/image 41). No additional significant pulmonary nodules. Upper abdomen: No acute abnormality. Musculoskeletal: No aggressive appearing focal osseous lesions. Mild thoracic spondylosis. Review of the MIP images confirms the above findings. IMPRESSION: 1. No pulmonary embolism. 2. Medial right upper lobe solid 1.6 cm pulmonary nodule. Consider one of the following in 3 months for both low-risk and high-risk individuals: (a) repeat chest CT, (b) follow-up PET-CT, or (c) tissue sampling. This recommendation follows the consensus statement: Guidelines for Management of Incidental Pulmonary Nodules Detected on CT Images: From the Fleischner Society 2017; Radiology 2017; 284:228-243. 3. No thoracic adenopathy. Electronically Signed   By: Ilona Sorrel M.D.   On: 07/17/2019 08:58    Procedures Procedures (including critical care time)  Medications Ordered in ED Medications  sodium chloride flush (NS) 0.9 % injection 3 mL (3 mLs Intravenous Given 07/17/19 0630)  iohexol (OMNIPAQUE) 350 MG/ML injection 75 mL (75 mLs Intravenous Contrast Given 07/17/19 0806)  morphine 4 MG/ML injection 4 mg (4 mg Intravenous Given 07/17/19 1051)     Initial Impression / Assessment and Plan / ED Course  I have reviewed the triage vital signs and the nursing  notes.  Pertinent labs & imaging results that were available during my care of the patient were reviewed by me and considered in my medical decision making (see chart for details).        Patient presenting with 1 week history of chest pain radiating to both arms.  Patient describes it as heaviness.  Her story is somewhat concerning for ACS, however over the past week into different evaluations, patient has had 4 negative high-sensitivity troponins.  Labs are otherwise unremarkable.  CT angio chest shows no PE, but there is a medial right upper lobe solid 1.6 cm pulmonary nodule.  Patient advised the importance of following up in 3 months for either repeat chest CT, PET CT, or tissue sampling.  Advised her to call her PCP tomorrow for this follow-up.  Patient symptoms are improved after morphine.  She declined nitroglycerin.  Considering heart score 6, I discussed patient case with cardiologist on-call, Dr. Curt Bears, who advised considering patient's 4 negative troponins, she could follow-up outpatient at her scheduled appointment in 3 days.  Patient we discharged home with short course of Norco for pain control.  Return precautions discussed.  Patient understands and agrees with plan.  Patient vitals stable throughout ED course and discharged in satisfactory condition. I discussed patient case with Dr. Wilson Singer who guided the patient's management and agrees with plan.   Final Clinical Impressions(s) / ED Diagnoses   Final diagnoses:  Nonspecific chest pain    ED Discharge Orders         Ordered    HYDROcodone-acetaminophen (NORCO/VICODIN) 5-325 MG tablet  Every 6 hours PRN     07/17/19 [address], [place] 07/17/19 1142    Virgel Manifold, MD 07/20/19 1936

## 2019-07-17 NOTE — ED Triage Notes (Signed)
Per EMS, pt from home, c/o of chest pain X2 weeks.  EMS stated that history varied during their assessment.  Pt does have a caridiology appointment in three days.  CBG 394.

## 2019-07-17 NOTE — ED Triage Notes (Signed)
Pt reports central chest pain that radiates to the left chest "feels heavy" radiates to both arms. No sob/dizziness/nausea. Seen here recently for the same, states she has an appt with cardiology on this month.

## 2019-07-17 NOTE — Discharge Instructions (Addendum)
Please follow-up with your cardiology appointment scheduled on Wednesday.  Please follow-up with your primary care provider by calling to make them aware of the pulmonary nodule that will need a follow-up CT in 3 months or tissue sampling.  Take Norco every 6 hours as needed for your pain.  Do not drive or operate machinery while taking this medication.  Please return the emergency department if you develop any new or worsening symptoms.  Do not drink alcohol, drive, operate machinery or participate in any other potentially dangerous activities while taking opiate pain medication as it may make you sleepy. Do not take this medication with any other sedating medications, either prescription or over-the-counter. If you were prescribed Percocet or Vicodin, do not take these with acetaminophen (Tylenol) as it is already contained within these medications and overdose of Tylenol is dangerous.   This medication is an opiate (or narcotic) pain medication and can be habit forming.  Use it as little as possible to achieve adequate pain control.  Do not use or use it with extreme caution if you have a history of opiate abuse or dependence. This medication is intended for your use only - do not give any to anyone else and keep it in a secure place where nobody else, especially children, have access to it. It will also cause or worsen constipation, so you may want to consider taking an over-the-counter stool softener while you are taking this medication.

## 2019-07-17 NOTE — ED Notes (Signed)
Patient transported to X-ray 

## 2019-07-19 NOTE — Progress Notes (Signed)
Cardiology Office Note:    Date:  07/20/2019   ID:  Ellen Avery, DOB Sep 25, 1966, MRN [id number]  PCP:  Javier Docker, MD  Cardiologist:  Shirlee More, MD   Referring MD: Javier Docker, MD  ASSESSMENT:    1. Pre-procedure lab exam   2. Chest pain in adult   3. Essential hypertension   4. Type 2 diabetes mellitus without complication, without long-term current use of insulin ([name])   5. Mixed hyperlipidemia    PLAN:    In order of problems listed above:  1. Chest pain in a high risk individual atypical features and because of her high cardiovascular risk with diabetes and hypertension she undergo evaluation cardiac CTA.  Further recommendations based upon results if she has CAD even mild should benefit from statin therapy. 2. Hypertension stable BP at target continue current treatment including ARB 3. Type 2 diabetes stable well-controlled continue current treatment 4. Hyperlipidemia if CAD is present would benefit from statin  Next appointment 6 weeks   Medication Adjustments/Labs and Tests Ordered: Current medicines are reviewed at length with the patient today.  Concerns regarding medicines are outlined above.  Orders Placed This Encounter  Procedures  . CT CORONARY FRACTIONAL FLOW RESERVE DATA PREP  . CT CORONARY FRACTIONAL FLOW RESERVE FLUID ANALYSIS  . CT CORONARY MORPH W/CTA COR W/SCORE W/CA W/CM &/OR WO/CM  . Basic Metabolic Panel (BMET)  . EKG 12-Lead   Meds ordered this encounter  Medications  . metoprolol tartrate (LOPRESSOR) 50 MG tablet    Sig: Take 2 tablets (100 mg total) by mouth once for 1 dose. Take 2 hours prior to cardiac CTA.    Dispense:  2 tablet    Refill:  0     Chief Complaint  Patient presents with  . Follow-up    after 2 ED visits  . Chest Pain    History of Present Illness:    Ellen Avery is a 53 y.o. female with hypertension hyperlipidemia and type 2 diabetes who is being seen today for the evaluation of chest pain  with to [facility] ED visits for chest pains 07/10/2019 and 07/17/2019.  At the request of [place], Ralene Bathe, MD.   She is felt under a great deal of personal and family stress during COVID-19 and has had chest discomfort she describes as pressure heaviness substernal radiating to the right precordium nonexertional unrelieved with rest lasted for hours and has had 2 emergency room evaluations.  She has had none since the last episode.  She has no exertional chest pain shortness of breath edema palpitation or syncope.  She is high risk for CAD with hypertension type 2 diabetes and from her description mild hyperlipidemia not on lipid-lowering therapy at this time.  After discussion of ischemia evaluation she elects to undergo cardiac CTA to define if she has CAD as well as severity and if high risk markers, the need for angiography and revascularization.  Of note her coronary arteries not calcified on CT scan and she has no known history of congenital or rheumatic heart disease.  She has a history of reflux but no recent symptoms of heartburn and has a history of anxiety and she thinks these episodes were a manifestation of stress and anxiety Past Medical History:  Diagnosis Date  . Anxiety   . Arthritis   . Chronic headaches   . DDD (degenerative disc disease), cervical   . Fibromyalgia   . GERD (gastroesophageal reflux disease)   .  History of colon polyps   . History of psychosis   . Hypercholesteremia   . Hypertension   . IBS (irritable bowel syndrome)   . MDD (major depressive disorder)   . Right sided facial pain   . Seasonal allergic rhinitis   . Sinus congestion    12-24-2018 per pt no fever  . Type 2 diabetes mellitus treated with insulin ([place])   . Vitamin D deficiency   . Wears dentures    full upper,  partial lower  . Wears glasses     Past Surgical History:  Procedure Laterality Date  . ARTERY BIOPSY Right 12/28/2018   Procedure: RIGHT BIOPSY TEMPORAL ARTERY;  Surgeon:  Kieth Brightly, Arta Bruce, MD;  Location: [place];  Service: General;  Laterality: Right;  . CARDIAC CATHETERIZATION    . MULTIPLE TOOTH EXTRACTIONS  04/ 2019    sedation  . TOTAL ABDOMINAL HYSTERECTOMY W/ BILATERAL SALPINGOOPHORECTOMY  2009    Current Medications: Current Meds  Medication Sig  . ACCU-CHEK AVIVA PLUS test strip USE TO CHECK BLOOD SUGAR 4 TIMES DAILY  . aspirin EC 81 MG tablet Take 81 mg by mouth daily.  . Cholecalciferol (VITAMIN D3) 3000 units TABS Take 3,000 Units by mouth daily.  . cyclobenzaprine (FLEXERIL) 10 MG tablet Take 1 tablet (10 mg total) by mouth 2 (two) times daily as needed for muscle spasms.  . DULoxetine (CYMBALTA) 60 MG capsule Take 60 mg by mouth daily.   . Famotidine (PEPCID AC PO) Take by mouth as needed.  . fluticasone (FLONASE) 50 MCG/ACT nasal spray Place 2 sprays into both nostrils daily.  Marland Kitchen gabapentin (NEURONTIN) 100 MG capsule Take 100-200 mg by mouth See admin instructions. Take 100 mg in the morning, 100 mg at lunch, and 200 mg in the evening  . HYDROcodone-acetaminophen (NORCO/VICODIN) 5-325 MG tablet Take 1 tablet by mouth every 6 (six) hours as needed for severe pain.  . hydrOXYzine (ATARAX/VISTARIL) 25 MG tablet Take 1 tablet (25 mg total) by mouth every 6 (six) hours as needed for anxiety.  . insulin glargine (LANTUS) 100 UNIT/ML injection Inject 50 Units into the skin at bedtime.   Marland Kitchen JANUVIA 50 MG tablet Take 50 mg by mouth daily.  Marland Kitchen losartan-hydrochlorothiazide (HYZAAR) 100-12.5 MG tablet Take 1 tablet by mouth daily.   . magnesium hydroxide (MILK OF MAGNESIA) 400 MG/5ML suspension Take by mouth daily as needed for mild constipation.  . metFORMIN (GLUCOPHAGE) 1000 MG tablet Take 1,000 mg by mouth daily with breakfast.   . simvastatin (ZOCOR) 40 MG tablet Take 40 mg by mouth every evening. Reported on 03/01/2016  . sodium chloride (OCEAN) 0.65 % SOLN nasal spray Place 1 spray into both nostrils as needed for congestion.      Allergies:   Flagyl [metronidazole]   Social History   Socioeconomic History  . Marital status: Divorced    Spouse name: Not on file  . Number of children: Not on file  . Years of education: Not on file  . Highest education level: Not on file  Occupational History  . Not on file  Social Needs  . Financial resource strain: Not on file  . Food insecurity    Worry: Not on file    Inability: Not on file  . Transportation needs    Medical: Not on file    Non-medical: Not on file  Tobacco Use  . Smoking status: Current Every Day Smoker    Packs/day: 1.00    Years: 35.00  Pack years: 35.00    Types: Cigarettes  . Smokeless tobacco: Never Used  Substance and Sexual Activity  . Alcohol use: No  . Drug use: No  . Sexual activity: Not on file  Lifestyle  . Physical activity    Days per week: Not on file    Minutes per session: Not on file  . Stress: Not on file  Relationships  . Social Herbalist on phone: Not on file    Gets together: Not on file    Attends religious service: Not on file    Active member of club or organization: Not on file    Attends meetings of clubs or organizations: Not on file    Relationship status: Not on file  Other Topics Concern  . Not on file  Social History Narrative  . Not on file     Family History: The patient's family history includes Diabetes in her mother and paternal uncle; Heart Problems in her brother; Heart disease in her paternal grandmother. There is no history of Colon cancer, Colon polyps, Esophageal cancer, Rectal cancer, or Stomach cancer.  ROS:   Review of Systems  Constitution: Negative.  HENT: Negative.   Eyes: Negative.   Cardiovascular: Positive for chest pain.  Respiratory: Negative.   Endocrine: Negative.   Hematologic/Lymphatic: Negative.   Skin: Negative.   Musculoskeletal: Positive for joint pain.  Gastrointestinal: Positive for heartburn.  Genitourinary: Negative.   Neurological: Negative.    Psychiatric/Behavioral: Negative.   Allergic/Immunologic: Negative.    Please see the history of present illness.     All other systems reviewed and are negative.  EKGs/Labs/Other Studies Reviewed:    The following studies were reviewed today:   EKG:  EKG 07/14/2019 personally reviewed and demonstrates [place] non specific ST abnormality  Recent Labs: 07/10/2019: ALT 15 07/17/2019: BUN 7; Creatinine, Ser 0.71; Hemoglobin 13.0; Platelets 362; Potassium 3.9; Sodium 135  Recent Lipid Panel No results found for: CHOL, TRIG, HDL, CHOLHDL, VLDL, LDLCALC, LDLDIRECT  Physical Exam:    VS:  BP 110/84 (BP Location: Left Arm, Patient Position: Sitting, Cuff Size: Large)   Pulse 82   Ht 5\' 7"  (1.702 m)   Wt 232 lb 1.9 oz (105.3 kg)   SpO2 98%   BMI 36.36 kg/m     Wt Readings from Last 3 Encounters:  07/20/19 232 lb 1.9 oz (105.3 kg)  07/17/19 240 lb (108.9 kg)  12/28/18 222 lb 12.8 oz (101.1 kg)     GEN:  Well nourished, well developed in no acute distress HEENT: Normal NECK: No JVD; No carotid bruits LYMPHATICS: No lymphadenopathy CARDIAC: She has no chest wall tenderness RRR, no murmurs, rubs, gallops RESPIRATORY:  Clear to auscultation without rales, wheezing or rhonchi  ABDOMEN: Soft, non-tender, non-distended MUSCULOSKELETAL:  No edema; No deformity  SKIN: Warm and dry NEUROLOGIC:  Alert and oriented x 3 PSYCHIATRIC:  Normal affect     Signed, Shirlee More, MD  07/20/2019 9:55 AM    [place]

## 2019-07-20 ENCOUNTER — Other Ambulatory Visit: Payer: Self-pay

## 2019-07-20 ENCOUNTER — Ambulatory Visit (INDEPENDENT_AMBULATORY_CARE_PROVIDER_SITE_OTHER): Payer: Medicaid Other | Admitting: Cardiology

## 2019-07-20 ENCOUNTER — Encounter: Payer: Self-pay | Admitting: Cardiology

## 2019-07-20 VITALS — BP 110/84 | HR 82 | Ht 67.0 in | Wt 232.1 lb

## 2019-07-20 DIAGNOSIS — R079 Chest pain, unspecified: Secondary | ICD-10-CM | POA: Diagnosis not present

## 2019-07-20 DIAGNOSIS — Z01812 Encounter for preprocedural laboratory examination: Secondary | ICD-10-CM | POA: Diagnosis not present

## 2019-07-20 DIAGNOSIS — I10 Essential (primary) hypertension: Secondary | ICD-10-CM | POA: Diagnosis not present

## 2019-07-20 DIAGNOSIS — E119 Type 2 diabetes mellitus without complications: Secondary | ICD-10-CM

## 2019-07-20 DIAGNOSIS — E782 Mixed hyperlipidemia: Secondary | ICD-10-CM

## 2019-07-20 MED ORDER — METOPROLOL TARTRATE 50 MG PO TABS: 100.0000 mg | ORAL_TABLET | Freq: Once | ORAL | 0 refills | Status: DC

## 2019-07-20 NOTE — Patient Instructions (Addendum)
Medication Instructions:  Your physician recommends that you continue on your current medications as directed. Please refer to the Current Medication list given to you today.   If you need a refill on your cardiac medications before your next appointment, please call your pharmacy.   Lab work: Your physician recommends that you return for lab work within 3-7 days before your cardiac CTA: BMP. Please return to our office for labs, no appointment needed. No need to fast beforehand.   If you have labs (blood work) drawn today and your tests are completely normal, you will receive your results only by: Marland Kitchen MyChart Message (if you have MyChart) OR . A paper copy in the mail If you have any lab test that is abnormal or we need to change your treatment, we will call you to review the results.  Testing/Procedures: Your physician has requested that you have cardiac CT. Cardiac computed tomography (CT) is a painless test that uses an x-ray machine to take clear, detailed pictures of your heart. For further information please visit [email or web address]. Please follow instruction sheet as given.  Your cardiac CT will be scheduled at one of the below locations:   [facility] [address] [place], [place] [address] (336) [place] [address] [place], [place] [address] [phone]  Please arrive at the [facility] main entrance of [facility] 30-45 minutes prior to test start time. Proceed to the [facility] Radiology Department (first floor) to check-in and test prep.  Please follow these instructions carefully (unless otherwise directed):   On the Night Before the Test: . Be sure to Drink plenty of water. . Do not consume any caffeinated/decaffeinated beverages or chocolate 12 hours prior to your test. . Do not take any antihistamines 12 hours prior to your test. . If you take Metformin do not take 24  hours prior to test. .  On the Day of the Test: . Drink plenty of water. Do not drink any water within one hour of the test. . Do not eat any food 4 hours prior to the test. . You may take your regular medications prior to the test.  . Take metoprolol (Lopressor) two hours prior to test. . HOLD losartan-hydrochlorothiazide morning of the test. DO NOT TAKE januvia the day of testing. Only take half of your dose of lantus the night before your test (25 units).  . FEMALES- please wear underwire-free bra if available                 -If HR is less than 55 BPM- No Beta Blocker                -IF HR is greater than 55 BPM and patient is less than or equal to 64 yrs old Lopressor 100mg  x1.       After the Test: . Drink plenty of water. . After receiving IV contrast, you may experience a mild flushed feeling. This is normal. . On occasion, you may experience a mild rash up to 24 hours after the test. This is not dangerous. If this occurs, you can take Benadryl 25 mg and increase your fluid intake. . If you experience trouble breathing, this can be serious. If it is severe call 911 IMMEDIATELY. If it is mild, please call our office. . If you take any of these medications: Glipizide/Metformin, Avandament, Glucavance, please do not take 48 hours after completing test.  Please contact the cardiac imaging nurse navigator should you have any questions/concerns Marchia Bond, RN Navigator Cardiac Imaging [facility] [phone] Office  [phone] Cell    Follow-Up: At [facility], you and your health needs are our priority.  As part of our continuing mission to provide you with exceptional heart care, we have created designated Provider Care Teams.  These Care Teams include your primary Cardiologist (physician) and Advanced Practice Providers (APPs -  Physician Assistants and Nurse Practitioners) who all work together to provide you with the care you need, when you  need it. You will need a follow up appointment in 6 weeks.

## 2019-07-29 ENCOUNTER — Other Ambulatory Visit: Payer: Self-pay

## 2019-07-29 ENCOUNTER — Telehealth: Payer: Self-pay | Admitting: Critical Care Medicine

## 2019-07-29 ENCOUNTER — Institutional Professional Consult (permissible substitution): Payer: Medicaid Other | Admitting: Critical Care Medicine

## 2019-07-29 ENCOUNTER — Ambulatory Visit (INDEPENDENT_AMBULATORY_CARE_PROVIDER_SITE_OTHER): Payer: Medicaid Other | Admitting: Critical Care Medicine

## 2019-07-29 ENCOUNTER — Encounter: Payer: Self-pay | Admitting: Critical Care Medicine

## 2019-07-29 VITALS — BP 114/78 | HR 84 | Temp 97.7°F | Ht 67.0 in | Wt 232.0 lb

## 2019-07-29 DIAGNOSIS — Z720 Tobacco use: Secondary | ICD-10-CM | POA: Diagnosis not present

## 2019-07-29 DIAGNOSIS — E119 Type 2 diabetes mellitus without complications: Secondary | ICD-10-CM | POA: Diagnosis not present

## 2019-07-29 DIAGNOSIS — R911 Solitary pulmonary nodule: Secondary | ICD-10-CM | POA: Diagnosis not present

## 2019-07-29 DIAGNOSIS — Z794 Long term (current) use of insulin: Secondary | ICD-10-CM

## 2019-07-29 DIAGNOSIS — IMO0001 Reserved for inherently not codable concepts without codable children: Secondary | ICD-10-CM

## 2019-07-29 MED ORDER — NICOTINE POLACRILEX 2 MG MT GUM: 2.0000 mg | CHEWING_GUM | OROMUCOSAL | Status: DC | PRN

## 2019-07-29 MED ORDER — NICOTINE 7 MG/24HR TD PT24: 14.0000 mg | MEDICATED_PATCH | Freq: Every day | TRANSDERMAL | Status: DC

## 2019-07-29 NOTE — Progress Notes (Signed)
Synopsis: Referred in August 2020 for evaluation of a lung nodule by Ellen Avery, Ralene Bathe, MD  Subjective:   PATIENT ID: Ellen Avery GENDER: female DOB: [birth year], MRN: [id number]  Chief Complaint  Patient presents with   Consult    Referred by PCP after being in the hospital for lung nodule found last week on her right side.     Ellen Avery is a 53 year old woman with a past medical history significant for GERD, insulin- dependent diabetes, hypertension, who had a recent ED visit for chest pain.  It was deemed to be not ACS, and she declined admission to the hospital in favor of outpatient follow-up with cardiology. A CT scan performed in the ED demonstrated a right upper lobe pulmonary nodule.  She is an active smoker with 37-pack-year history, recently she has been working on cutting back.  She has stretched a pack to last few days, and smokes less than a full cigarette.  She has previously quit with nicotine replacement therapy, but due to stress always returns to smoking.  She is around smokers frequently in her neighborhood.  She continues to experience the chest pain that took her to the ER.  She describes it as right-sided chest pain radiating to the sternum with associated pain across her upper back.  It is an intermittent heavy feeling that sometimes causes her to catch her breath.  She notices it with stress, walking, and sometimes when she is excited.  It can last up to 15 minutes and self- resolves.  The episode she had when she went to the ED lasted for several hours.  She does not have shortness of breath, and can walk for 15 to 20 minutes before stopping.  She denies fevers, chills, sweats, weight loss, change in appetite, nausea, headaches different from her baseline, weakness, or other new symptoms.  She is up-to-date on her cancer screening including colonoscopy, Pap smear, mammogram.  Her family history is notable for breast cancer in multiple women, head and neck cancer in a  cousin, and cervical cancer in an aunt.  Past Medical History:  Diagnosis Date   Anxiety    Arthritis    Chronic headaches    DDD (degenerative disc disease), cervical    Fibromyalgia    GERD (gastroesophageal reflux disease)    History of colon polyps    History of psychosis    Hypercholesteremia    Hypertension    IBS (irritable bowel syndrome)    MDD (major depressive disorder)    Right sided facial pain    Seasonal allergic rhinitis    Sinus congestion    12-24-2018 per pt no fever   Type 2 diabetes mellitus treated with insulin (HCC)    Vitamin D deficiency    Wears dentures    full upper,  partial lower   Wears glasses      Family History  Problem Relation Age of Onset   Diabetes Mother    Heart disease Paternal Grandmother    Breast cancer Paternal Grandmother    Diabetes Paternal Uncle    Heart Problems Brother    Breast cancer Maternal Aunt    Head & neck cancer Cousin    Cervical cancer Paternal Aunt    Colon cancer Neg Hx    Colon polyps Neg Hx    Esophageal cancer Neg Hx    Rectal cancer Neg Hx    Stomach cancer Neg Hx      Past Surgical History:  Procedure Laterality Date  ARTERY BIOPSY Right 12/28/2018   Procedure: RIGHT BIOPSY TEMPORAL ARTERY;  Surgeon: Kinsinger, Arta Bruce, MD;  Location: [facility];  Service: General;  Laterality: Right;   CARDIAC CATHETERIZATION     MULTIPLE TOOTH EXTRACTIONS  04/ 2019    sedation   TOTAL ABDOMINAL HYSTERECTOMY W/ BILATERAL SALPINGOOPHORECTOMY  2009    Social History   Socioeconomic History   Marital status: Divorced    Spouse name: Not on file   Number of children: Not on file   Years of education: Not on file   Highest education level: Not on file  Occupational History   Not on file  Social Needs   Financial resource strain: Not on file   Food insecurity    Worry: Not on file    Inability: Not on file   Transportation needs    Medical:  Not on file    Non-medical: Not on file  Tobacco Use   Smoking status: Current Every Day Smoker    Packs/day: 1.00    Years: 35.00    Pack years: 35.00    Types: Cigarettes   Smokeless tobacco: Never Used  Substance and Sexual Activity   Alcohol use: No   Drug use: No   Sexual activity: Not on file  Lifestyle   Physical activity    Days per week: Not on file    Minutes per session: Not on file   Stress: Not on file  Relationships   Social connections    Talks on phone: Not on file    Gets together: Not on file    Attends religious service: Not on file    Active member of club or organization: Not on file    Attends meetings of clubs or organizations: Not on file    Relationship status: Not on file   Intimate partner violence    Fear of current or ex partner: Not on file    Emotionally abused: Not on file    Physically abused: Not on file    Forced sexual activity: Not on file  Other Topics Concern   Not on file  Social History Narrative   Not on file     Allergies  Allergen Reactions   Flagyl [Metronidazole] Hives and Itching    Benadryl       There is no immunization history on file for this patient.  Outpatient Medications Prior to Visit  Medication Sig Dispense Refill   ACCU-CHEK AVIVA PLUS test strip USE TO CHECK BLOOD SUGAR 4 TIMES DAILY  2   aspirin EC 81 MG tablet Take 81 mg by mouth daily.     Cholecalciferol (VITAMIN D3) 3000 units TABS Take 3,000 Units by mouth daily.     cyclobenzaprine (FLEXERIL) 10 MG tablet Take 1 tablet (10 mg total) by mouth 2 (two) times daily as needed for muscle spasms. 20 tablet 0   DULoxetine (CYMBALTA) 60 MG capsule Take 60 mg by mouth daily.      Famotidine (PEPCID AC PO) Take by mouth as needed.     fluticasone (FLONASE) 50 MCG/ACT nasal spray Place 2 sprays into both nostrils daily. 16 g 0   gabapentin (NEURONTIN) 100 MG capsule Take 100-200 mg by mouth See admin instructions. Take 100 mg in the  morning, 100 mg at lunch, and 200 mg in the evening     HYDROcodone-acetaminophen (NORCO/VICODIN) 5-325 MG tablet Take 1 tablet by mouth every 6 (six) hours as needed for severe pain. 10 tablet 0  hydrOXYzine (ATARAX/VISTARIL) 25 MG tablet Take 1 tablet (25 mg total) by mouth every 6 (six) hours as needed for anxiety. 20 tablet 0   insulin glargine (LANTUS) 100 UNIT/ML injection Inject 50 Units into the skin at bedtime.      JANUVIA 50 MG tablet Take 50 mg by mouth daily.     losartan-hydrochlorothiazide (HYZAAR) 100-12.5 MG tablet Take 1 tablet by mouth daily.      magnesium hydroxide (MILK OF MAGNESIA) 400 MG/5ML suspension Take by mouth daily as needed for mild constipation.     metFORMIN (GLUCOPHAGE) 1000 MG tablet Take 1,000 mg by mouth daily with breakfast.      simvastatin (ZOCOR) 40 MG tablet Take 40 mg by mouth every evening. Reported on 03/01/2016     sodium chloride (OCEAN) 0.65 % SOLN nasal spray Place 1 spray into both nostrils as needed for congestion. 15 mL 0   metoprolol tartrate (LOPRESSOR) 50 MG tablet Take 2 tablets (100 mg total) by mouth once for 1 dose. Take 2 hours prior to cardiac CTA. 2 tablet 0   No facility-administered medications prior to visit.     Review of Systems  Constitutional: Negative for chills, fever and weight loss.  HENT: Positive for congestion.   Eyes: Negative.   Respiratory: Positive for cough, sputum production and shortness of breath.   Cardiovascular: Positive for chest pain.  Gastrointestinal: Positive for heartburn. Negative for blood in stool, diarrhea, nausea and vomiting.  Genitourinary: Negative for hematuria.  Musculoskeletal: Positive for joint pain.  Skin: Negative for rash.  Neurological: Negative for dizziness, speech change and focal weakness.       Chronic headaches at baseline  Psychiatric/Behavioral: Positive for depression. The patient is nervous/anxious.      Objective:   Vitals:   07/29/19 1101  BP: 114/78    Pulse: 84  Temp: 97.7 F (36.5 C)  TempSrc: Oral  SpO2: 98%  Weight: 232 lb (105.2 kg)  Height: 5\' 7"  (1.702 m)   98% on RA BMI Readings from Last 3 Encounters:  07/29/19 36.34 kg/m  07/20/19 36.36 kg/m  07/17/19 37.59 kg/m   Wt Readings from Last 3 Encounters:  07/29/19 232 lb (105.2 kg)  07/20/19 232 lb 1.9 oz (105.3 kg)  07/17/19 240 lb (108.9 kg)    Physical Exam Vitals signs reviewed.  Constitutional:      General: She is not in acute distress.    Appearance: She is obese. She is not ill-appearing.  HENT:     Head: Normocephalic and atraumatic.     Nose:     Comments: Deferred due to masking requirement.    Mouth/Throat:     Comments: Deferred due to masking requirement. Eyes:     General: No scleral icterus. Neck:     Musculoskeletal: Neck supple.  Cardiovascular:     Rate and Rhythm: Normal rate and regular rhythm.     Heart sounds: No murmur.  Pulmonary:     Comments: Breathing comfortably on room air.  No conversational dyspnea.  Clear to auscultation bilaterally. Abdominal:     Comments: Obese, soft, nondistended.  Musculoskeletal:        General: No deformity.  Lymphadenopathy:     Cervical: No cervical adenopathy.  Skin:    General: Skin is warm and dry.     Findings: No rash.  Neurological:     Mental Status: She is alert.     Coordination: Coordination normal.     Comments: Normal speech, moving all extremities  spontaneously, normal gait.  Psychiatric:        Behavior: Behavior normal.     Comments: Tearful at times during our discussion      CBC    Component Value Date/Time   WBC 8.5 07/17/2019 0625   RBC 4.65 07/17/2019 0625   HGB 13.0 07/17/2019 0625   HCT 39.4 07/17/2019 0625   PLT 362 07/17/2019 0625   MCV 84.7 07/17/2019 0625   MCH 28.0 07/17/2019 0625   MCHC 33.0 07/17/2019 0625   RDW 12.3 07/17/2019 0625   LYMPHSABS 3.0 07/10/2019 0254   MONOABS 0.6 07/10/2019 0254   EOSABS 0.1 07/10/2019 0254   BASOSABS 0.1  07/10/2019 0254   BMP 07/17/2019 normal other than hyperglycemia. No A1c on file  Chest Imaging- films reviewed: CT chest chest contrast 07/17/2019- No significant mediastinal adenopathy.  Contrast refluxing from the IVC into the liver.  1.6 cm mass in the anterior or apical segment of the right upper lobe.  Pulmonary Functions Testing Results: No flowsheet data found.  Pathology: None  Echocardiogram: None     Assessment & Plan:     ICD-10-CM   1. Lung nodule  R91.1 nicotine (NICODERM CQ - dosed in mg/24 hr) patch 14 mg    nicotine polacrilex (NICORETTE) gum 2 mg    NM PET Image Initial (PI) Skull Base To Thigh    Pulmonary function test  2. Tobacco abuse  Z72.0 nicotine (NICODERM CQ - dosed in mg/24 hr) patch 14 mg    nicotine polacrilex (NICORETTE) gum 2 mg    NM PET Image Initial (PI) Skull Base To Thigh    Pulmonary function test  3. Solitary pulmonary nodule  R91.1 NM PET Image Initial (PI) Skull Base To Thigh  4. Morbid obesity due to excess calories (HCC)  E66.01   5. IDDM (insulin dependent diabetes mellitus) ([place])  E11.9    Z79.4    RUL lung nodule in a smoker- high risk for malignancy. -PET scan ordered.  Due to the location of this nodule, it would be difficult to biopsy both bronchoscopically and transcutaneously. -PFTs ordered  -Agree with planned cardiac work-up per cardiology -Continue social distancing and masking per COVID-19 recommendations.  Tobacco abuse-working on quitting -Discussed the importance of quitting smoking and congratulated her on her efforts so far. -Prescribed nicotine patches and gum - Discussed strategies that generally support success with smoking cessation, including telling family and friends of your plans.  Obesity and type 2 diabetes.  Per her report her diabetes is poorly controlled recently. -Recommend modest weight loss as a long-term goal - Discussed the importance of euglycemia for the accuracy of a PET scan.  She has already  been working on cutting back on sweets and knows that she will receive additional instructions from the radiology department regarding dietary restrictions leading up to her scan.  She will continue to check her blood glucoses regularly to help her meet her target. -Continue regular physical activity.  Return to clinic in about 2 weeks following PET scan.  Current Outpatient Medications:    ACCU-CHEK AVIVA PLUS test strip, USE TO CHECK BLOOD SUGAR 4 TIMES DAILY, Disp: , Rfl: 2   aspirin EC 81 MG tablet, Take 81 mg by mouth daily., Disp: , Rfl:    Cholecalciferol (VITAMIN D3) 3000 units TABS, Take 3,000 Units by mouth daily., Disp: , Rfl:    cyclobenzaprine (FLEXERIL) 10 MG tablet, Take 1 tablet (10 mg total) by mouth 2 (two) times daily as needed for  muscle spasms., Disp: 20 tablet, Rfl: 0   DULoxetine (CYMBALTA) 60 MG capsule, Take 60 mg by mouth daily. , Disp: , Rfl:    Famotidine (PEPCID AC PO), Take by mouth as needed., Disp: , Rfl:    fluticasone (FLONASE) 50 MCG/ACT nasal spray, Place 2 sprays into both nostrils daily., Disp: 16 g, Rfl: 0   gabapentin (NEURONTIN) 100 MG capsule, Take 100-200 mg by mouth See admin instructions. Take 100 mg in the morning, 100 mg at lunch, and 200 mg in the evening, Disp: , Rfl:    HYDROcodone-acetaminophen (NORCO/VICODIN) 5-325 MG tablet, Take 1 tablet by mouth every 6 (six) hours as needed for severe pain., Disp: 10 tablet, Rfl: 0   hydrOXYzine (ATARAX/VISTARIL) 25 MG tablet, Take 1 tablet (25 mg total) by mouth every 6 (six) hours as needed for anxiety., Disp: 20 tablet, Rfl: 0   insulin glargine (LANTUS) 100 UNIT/ML injection, Inject 50 Units into the skin at bedtime. , Disp: , Rfl:    JANUVIA 50 MG tablet, Take 50 mg by mouth daily., Disp: , Rfl:    losartan-hydrochlorothiazide (HYZAAR) 100-12.5 MG tablet, Take 1 tablet by mouth daily. , Disp: , Rfl:    magnesium hydroxide (MILK OF MAGNESIA) 400 MG/5ML suspension, Take by mouth daily as  needed for mild constipation., Disp: , Rfl:    metFORMIN (GLUCOPHAGE) 1000 MG tablet, Take 1,000 mg by mouth daily with breakfast. , Disp: , Rfl:    simvastatin (ZOCOR) 40 MG tablet, Take 40 mg by mouth every evening. Reported on 03/01/2016, Disp: , Rfl:    sodium chloride (OCEAN) 0.65 % SOLN nasal spray, Place 1 spray into both nostrils as needed for congestion., Disp: 15 mL, Rfl: 0   metoprolol tartrate (LOPRESSOR) 50 MG tablet, Take 2 tablets (100 mg total) by mouth once for 1 dose. Take 2 hours prior to cardiac CTA., Disp: 2 tablet, Rfl: 0  Current Facility-Administered Medications:    nicotine (NICODERM CQ - dosed in mg/24 hr) patch 14 mg, 14 mg, Transdermal, Daily, Carlis Abbott, Pranit Owensby P, DO   nicotine polacrilex (NICORETTE) gum 2 mg, 2 mg, Oral, PRN, [name], DO   [name], DO [facility] 07/29/2019 12:01 PM

## 2019-07-29 NOTE — Progress Notes (Signed)
   Subjective:    Patient ID: Ellen Avery, female    DOB: 06/30/1966, 53 y.o.   MRN: [id number]  HPI    Review of Systems  Constitutional: Negative for fever and unexpected weight change.  HENT: Positive for congestion. Negative for dental problem, ear pain, nosebleeds, postnasal drip, rhinorrhea, sinus pressure, sneezing, sore throat and trouble swallowing.   Eyes: Negative for redness and itching.  Respiratory: Positive for cough, chest tightness and shortness of breath. Negative for wheezing.   Cardiovascular: Negative for palpitations and leg swelling.  Gastrointestinal: Negative for nausea and vomiting.  Genitourinary: Negative for dysuria.  Musculoskeletal: Positive for joint swelling.  Skin: Negative for rash.  Allergic/Immunologic: Negative.  Negative for environmental allergies, food allergies and immunocompromised state.  Neurological: Negative for headaches.  Hematological: Does not bruise/bleed easily.  Psychiatric/Behavioral: Negative for dysphoric mood. The patient is nervous/anxious.        Objective:   Physical Exam        Assessment & Plan:

## 2019-07-29 NOTE — Telephone Encounter (Signed)
I just tried to call the patient back and the phone went to voice mail I am calling to give her the PET scan appt information

## 2019-07-29 NOTE — Progress Notes (Deleted)
Synopsis: Referred in August 2020 for evaluation of a lung nodule by Javier Docker, MD  Subjective:   PATIENT ID: Ellen Avery GENDER: female DOB: 19-Jan-1966, MRN: [id number]  No chief complaint on file.   Mrs. Boch is a 53 year old woman with a past medical history significant for GERD, insulin- dependent diabetes, hypertension, who had a recent ED visit for chest pain.  It was deemed to be not ACS, and she declined admission to the hospital in favor of outpatient follow-up with cardiology. A CT scan performed in the ED demonstrated a right upper lobe pulmonary nodule.  She is an active smoker with 37-pack-year history.    Family history of cancer Personal history of cancer Weight loss Headaches Weakness Nausea Poor appetite Fevers Sweats  Past Medical History:  Diagnosis Date  . Anxiety   . Arthritis   . Chronic headaches   . DDD (degenerative disc disease), cervical   . Fibromyalgia   . GERD (gastroesophageal reflux disease)   . History of colon polyps   . History of psychosis   . Hypercholesteremia   . Hypertension   . IBS (irritable bowel syndrome)   . MDD (major depressive disorder)   . Right sided facial pain   . Seasonal allergic rhinitis   . Sinus congestion    12-24-2018 per pt no fever  . Type 2 diabetes mellitus treated with insulin ([place])   . Vitamin D deficiency   . Wears dentures    full upper,  partial lower  . Wears glasses      Family History  Problem Relation Age of Onset  . Diabetes Mother   . Heart disease Paternal Grandmother   . Diabetes Paternal Uncle   . Heart Problems Brother   . Colon cancer Neg Hx   . Colon polyps Neg Hx   . Esophageal cancer Neg Hx   . Rectal cancer Neg Hx   . Stomach cancer Neg Hx      Past Surgical History:  Procedure Laterality Date  . ARTERY BIOPSY Right 12/28/2018   Procedure: RIGHT BIOPSY TEMPORAL ARTERY;  Surgeon: Kieth Brightly, Arta Bruce, MD;  Location: [facility];  Service:  General;  Laterality: Right;  . CARDIAC CATHETERIZATION    . MULTIPLE TOOTH EXTRACTIONS  04/ 2019    sedation  . TOTAL ABDOMINAL HYSTERECTOMY W/ BILATERAL SALPINGOOPHORECTOMY  2009    Social History   Socioeconomic History  . Marital status: Divorced    Spouse name: Not on file  . Number of children: Not on file  . Years of education: Not on file  . Highest education level: Not on file  Occupational History  . Not on file  Social Needs  . Financial resource strain: Not on file  . Food insecurity    Worry: Not on file    Inability: Not on file  . Transportation needs    Medical: Not on file    Non-medical: Not on file  Tobacco Use  . Smoking status: Current Every Day Smoker    Packs/day: 1.00    Years: 35.00    Pack years: 35.00    Types: Cigarettes  . Smokeless tobacco: Never Used  Substance and Sexual Activity  . Alcohol use: No  . Drug use: No  . Sexual activity: Not on file  Lifestyle  . Physical activity    Days per week: Not on file    Minutes per session: Not on file  . Stress: Not on file  Relationships  .  Social Herbalist on phone: Not on file    Gets together: Not on file    Attends religious service: Not on file    Active member of club or organization: Not on file    Attends meetings of clubs or organizations: Not on file    Relationship status: Not on file  . Intimate partner violence    Fear of current or ex partner: Not on file    Emotionally abused: Not on file    Physically abused: Not on file    Forced sexual activity: Not on file  Other Topics Concern  . Not on file  Social History Narrative  . Not on file     Allergies  Allergen Reactions  . Flagyl [Metronidazole] Hives and Itching    Benadryl       There is no immunization history on file for this patient.  Outpatient Medications Prior to Visit  Medication Sig Dispense Refill  . ACCU-CHEK AVIVA PLUS test strip USE TO CHECK BLOOD SUGAR 4 TIMES DAILY  2  . aspirin EC  81 MG tablet Take 81 mg by mouth daily.    . Cholecalciferol (VITAMIN D3) 3000 units TABS Take 3,000 Units by mouth daily.    . cyclobenzaprine (FLEXERIL) 10 MG tablet Take 1 tablet (10 mg total) by mouth 2 (two) times daily as needed for muscle spasms. 20 tablet 0  . DULoxetine (CYMBALTA) 60 MG capsule Take 60 mg by mouth daily.     . Famotidine (PEPCID AC PO) Take by mouth as needed.    . fluticasone (FLONASE) 50 MCG/ACT nasal spray Place 2 sprays into both nostrils daily. 16 g 0  . gabapentin (NEURONTIN) 100 MG capsule Take 100-200 mg by mouth See admin instructions. Take 100 mg in the morning, 100 mg at lunch, and 200 mg in the evening    . HYDROcodone-acetaminophen (NORCO/VICODIN) 5-325 MG tablet Take 1 tablet by mouth every 6 (six) hours as needed for severe pain. 10 tablet 0  . hydrOXYzine (ATARAX/VISTARIL) 25 MG tablet Take 1 tablet (25 mg total) by mouth every 6 (six) hours as needed for anxiety. 20 tablet 0  . insulin glargine (LANTUS) 100 UNIT/ML injection Inject 50 Units into the skin at bedtime.     Marland Kitchen JANUVIA 50 MG tablet Take 50 mg by mouth daily.    Marland Kitchen losartan-hydrochlorothiazide (HYZAAR) 100-12.5 MG tablet Take 1 tablet by mouth daily.     . magnesium hydroxide (MILK OF MAGNESIA) 400 MG/5ML suspension Take by mouth daily as needed for mild constipation.    . metFORMIN (GLUCOPHAGE) 1000 MG tablet Take 1,000 mg by mouth daily with breakfast.     . metoprolol tartrate (LOPRESSOR) 50 MG tablet Take 2 tablets (100 mg total) by mouth once for 1 dose. Take 2 hours prior to cardiac CTA. 2 tablet 0  . simvastatin (ZOCOR) 40 MG tablet Take 40 mg by mouth every evening. Reported on 03/01/2016    . sodium chloride (OCEAN) 0.65 % SOLN nasal spray Place 1 spray into both nostrils as needed for congestion. 15 mL 0   No facility-administered medications prior to visit.     ROS   Objective:  There were no vitals filed for this visit.   on *** LPM *** RA BMI Readings from Last 3 Encounters:   07/20/19 36.36 kg/m  07/17/19 37.59 kg/m  12/28/18 34.90 kg/m   Wt Readings from Last 3 Encounters:  07/20/19 232 lb 1.9 oz (105.3 kg)  07/17/19 240 lb (108.9 kg)  12/28/18 222 lb 12.8 oz (101.1 kg)    Physical Exam   CBC    Component Value Date/Time   WBC 8.5 07/17/2019 0625   RBC 4.65 07/17/2019 0625   HGB 13.0 07/17/2019 0625   HCT 39.4 07/17/2019 0625   PLT 362 07/17/2019 0625   MCV 84.7 07/17/2019 0625   MCH 28.0 07/17/2019 0625   MCHC 33.0 07/17/2019 0625   RDW 12.3 07/17/2019 0625   LYMPHSABS 3.0 07/10/2019 0254   MONOABS 0.6 07/10/2019 0254   EOSABS 0.1 07/10/2019 0254   BASOSABS 0.1 07/10/2019 0254    ***  Chest Imaging- films reviewed: CT chest chest contrast 07/17/2019- No significant mediastinal adenopathy.  Contrast refluxing from the IVC into the liver.  1.6 cm mass in the anterior or apical segment of the right upper lobe.  Pulmonary Functions Testing Results: No flowsheet data found.  Pathology: None  Echocardiogram: None     Assessment & Plan:     ICD-10-CM   1. Solitary pulmonary nodule  R91.1   2. Tobacco abuse  Z72.0       Current Outpatient Medications:  .  ACCU-CHEK AVIVA PLUS test strip, USE TO CHECK BLOOD SUGAR 4 TIMES DAILY, Disp: , Rfl: 2 .  aspirin EC 81 MG tablet, Take 81 mg by mouth daily., Disp: , Rfl:  .  Cholecalciferol (VITAMIN D3) 3000 units TABS, Take 3,000 Units by mouth daily., Disp: , Rfl:  .  cyclobenzaprine (FLEXERIL) 10 MG tablet, Take 1 tablet (10 mg total) by mouth 2 (two) times daily as needed for muscle spasms., Disp: 20 tablet, Rfl: 0 .  DULoxetine (CYMBALTA) 60 MG capsule, Take 60 mg by mouth daily. , Disp: , Rfl:  .  Famotidine (PEPCID AC PO), Take by mouth as needed., Disp: , Rfl:  .  fluticasone (FLONASE) 50 MCG/ACT nasal spray, Place 2 sprays into both nostrils daily., Disp: 16 g, Rfl: 0 .  gabapentin (NEURONTIN) 100 MG capsule, Take 100-200 mg by mouth See admin instructions. Take 100 mg in the morning,  100 mg at lunch, and 200 mg in the evening, Disp: , Rfl:  .  HYDROcodone-acetaminophen (NORCO/VICODIN) 5-325 MG tablet, Take 1 tablet by mouth every 6 (six) hours as needed for severe pain., Disp: 10 tablet, Rfl: 0 .  hydrOXYzine (ATARAX/VISTARIL) 25 MG tablet, Take 1 tablet (25 mg total) by mouth every 6 (six) hours as needed for anxiety., Disp: 20 tablet, Rfl: 0 .  insulin glargine (LANTUS) 100 UNIT/ML injection, Inject 50 Units into the skin at bedtime. , Disp: , Rfl:  .  JANUVIA 50 MG tablet, Take 50 mg by mouth daily., Disp: , Rfl:  .  losartan-hydrochlorothiazide (HYZAAR) 100-12.5 MG tablet, Take 1 tablet by mouth daily. , Disp: , Rfl:  .  magnesium hydroxide (MILK OF MAGNESIA) 400 MG/5ML suspension, Take by mouth daily as needed for mild constipation., Disp: , Rfl:  .  metFORMIN (GLUCOPHAGE) 1000 MG tablet, Take 1,000 mg by mouth daily with breakfast. , Disp: , Rfl:  .  metoprolol tartrate (LOPRESSOR) 50 MG tablet, Take 2 tablets (100 mg total) by mouth once for 1 dose. Take 2 hours prior to cardiac CTA., Disp: 2 tablet, Rfl: 0 .  simvastatin (ZOCOR) 40 MG tablet, Take 40 mg by mouth every evening. Reported on 03/01/2016, Disp: , Rfl:  .  sodium chloride (OCEAN) 0.65 % SOLN nasal spray, Place 1 spray into both nostrils as needed for congestion., Disp: 15 mL, Rfl:  0   [name], DO [facility] 07/29/2019 7:46 AM

## 2019-07-29 NOTE — Patient Instructions (Addendum)
Thank you for visiting Dr. Carlis Abbott at [facility]. We recommend the following: Orders Placed This Encounter  Procedures  . NM PET Image Initial (PI) Skull Base To Thigh  . Pulmonary function test   Orders Placed This Encounter  Procedures  . NM PET Image Initial (PI) Skull Base To Thigh    In 1-2 weeks please    Standing Status:   Future    Standing Expiration Date:   09/27/2020    Order Specific Question:   ** REASON FOR EXAM (FREE TEXT)    Answer:   1.6cm RUL nodule in smoker    Order Specific Question:   If indicated for the ordered procedure, I authorize the administration of a radiopharmaceutical per Radiology protocol    Answer:   Yes    Order Specific Question:   Is the patient pregnant?    Answer:   No    Order Specific Question:   Preferred imaging location?    Answer:   [facility]    Order Specific Question:   Radiology Contrast Protocol - do NOT remove file path    Answer:   [email or web address]  . Pulmonary function test    Standing Status:   Future    Standing Expiration Date:   07/28/2020    Scheduling Instructions:     Within 1 month please    Order Specific Question:   Where should this test be performed?    Answer:   [facility]    Order Specific Question:   Full PFT: includes the following: basic spirometry, spirometry pre & post bronchodilator, diffusion capacity (DLCO), lung volumes    Answer:   Full PFT    Meds ordered this encounter  Medications  . nicotine (NICODERM CQ - dosed in mg/24 hr) patch 14 mg  . nicotine polacrilex (NICORETTE) gum 2 mg    Follow up in 2-3 weeks after PET scan.   Please do your part to reduce the spread of COVID-19.   It is very important that you stop smoking or vaping. This is the single most important thing that you can do to improve your lung health.   S = Set a quit date. T = Tell family, friends, and the people around you that you plan to quit. A = Anticipate or plan ahead for the  tough times you'll face while quitting. R = Remove cigarettes and other tobacco products from your home, car, and work. T = Talk to [place] about getting help to quit.  If you need help, please reach out to our office or the smoking cessation resources available: [place] Smoking Cessation Class: [id number]:5250201 1-800-QUIT-NOW www.BeTobaccoFree.gov

## 2019-07-29 NOTE — Telephone Encounter (Signed)
Message routed to Dr. Carlis Abbott for confirmation to proceed with PET scan scheduled 08/08/19.  Dr. Carlis Abbott patient was concerned you did not want PET scan until her blood sugars were under better control.  She would like reassurance that it is ok to proceed with scan this month on 8/31.  *SEE PHONE NOTE BELOW  Your OV note 07/29/19 reads as follows:  RUL lung nodule in a smoker- high risk for malignancy. -PET scan ordered.  Due to the location of this nodule, it would be difficult to biopsy both bronchoscopically and transcutaneously. -PFTs ordered  -Agree with planned cardiac work-up per cardiology -Continue social distancing and masking per COVID-19 recommendations.  Tobacco abuse-working on quitting -Discussed the importance of quitting smoking and congratulated her on her efforts so far. -Prescribed nicotine patches and gum - Discussed strategies that generally support success with smoking cessation, including telling family and friends of your plans.  Obesity and type 2 diabetes.  Per her report her diabetes is poorly controlled recently. -Recommend modest weight loss as a long-term goal - Discussed the importance of euglycemia for the accuracy of a PET scan.  She has already been working on cutting back on sweets and knows that she will receive additional instructions from the radiology department regarding dietary restrictions leading up to her scan.  She will continue to check her blood glucoses regularly to help her meet her target. -Continue regular physical activity.  Return to clinic in about 2 weeks following PET scan.  Dr. Carlis Abbott please advise so we may reassure patient scan should be completed as scheduled or if you have further instructions.

## 2019-07-29 NOTE — Telephone Encounter (Signed)
I finally got to speak with Ellen Avery about the PET Scan appt on 08/08/2019. I told her that Dr. Carlis Abbott wanted the PET Scan done 1-2 weeks and return in 2-3 weeks after PET Scan. Ellen Avery thought that Dr. Carlis Abbott wanted her to get her glucose levels better before doing the PET Scan. Ellen Avery states that her sugars have been running high. Would someone check with Dr. Carlis Abbott about this to put the patients mind at ease about doing the PET now.

## 2019-08-01 NOTE — Telephone Encounter (Signed)
Attempted to return call to patient with Dr. Ainsley Avery recommendation and advice on continuing with PET scan as soon as possible. No answer. Left voicemail to call us.  Patient had been concerned about proceeding with PET scan due to blood sugars not well controlled.  Dr. Carlis Avery advised the following:    'Because of the risk of malignancy, this needs to be done as soon as possible. Radiology/ NM should have protocols in place to determine if it needs to be pushed back, but I would want to proceed as soon as radiology thinks that she is in a range that the test can be performed. I do not want to delay waiting on a PCP visit, which could take weeks before meds get changed. I think based on talking to her that she will be able to get them in an acceptable range by being strict with her diet between now and then, based on her fasting BGs she told me (150), which are not terribly out of range of what would be needed that day (<120). Might be a good idea to make her appointment in the morning though. She knows what she needs to cut out. If you talk to her and she feels differently, we can push it back, but we should try to limit doing that as much as we can.'

## 2019-08-01 NOTE — Telephone Encounter (Signed)
Patient returned call regarding PET scan.  Reviewed Dr. Ainsley Spinner comments with patient and patient states she understood and had no further questions.  Patient has PET scan scheduled 08/08/19 and plans to stick to a strict eating plan in preparation for the scan.  Nothing further needed at this time.

## 2019-08-08 ENCOUNTER — Ambulatory Visit (HOSPITAL_COMMUNITY)
Admission: RE | Admit: 2019-08-08 | Discharge: 2019-08-08 | Disposition: A | Discharge status: Home / Self Care | Payer: Medicaid Other | Source: Ambulatory Visit | Attending: Critical Care Medicine | Admitting: Critical Care Medicine

## 2019-08-08 ENCOUNTER — Other Ambulatory Visit: Payer: Self-pay

## 2019-08-08 DIAGNOSIS — Z720 Tobacco use: Secondary | ICD-10-CM | POA: Insufficient documentation

## 2019-08-08 DIAGNOSIS — R911 Solitary pulmonary nodule: Secondary | ICD-10-CM | POA: Insufficient documentation

## 2019-08-08 LAB — GLUCOSE, CAPILLARY: Glucose-Capillary: 275 mg/dL — ABNORMAL HIGH (ref 70–99)

## 2019-08-08 MED ORDER — FLUDEOXYGLUCOSE F - 18 (FDG) INJECTION
11.6300 | Freq: Once | INTRAVENOUS | Status: AC | PRN
  Administered 2019-08-08: 12:00:00 11.63 via INTRAVENOUS

## 2019-08-09 ENCOUNTER — Telehealth: Payer: Self-pay | Admitting: Critical Care Medicine

## 2019-08-09 DIAGNOSIS — R911 Solitary pulmonary nodule: Secondary | ICD-10-CM

## 2019-08-09 NOTE — Telephone Encounter (Signed)
Notified Ellen Avery of her PET scan results demonstrating no FDG uptake in her lung nodule.  She understands that we will still need to do surveillance CT scans and that smoking cessation remains an important goal.  She has been in touch with her primary care provider about her blood glucose, and has been started on a new medication.  She has been referred for nutrition classes.  Follow-up as previously planned.  [name], DO 08/09/19 10:01 AM [facility]

## 2019-08-19 ENCOUNTER — Ambulatory Visit
Admission: RE | Admit: 2019-08-19 | Discharge: 2019-08-19 | Disposition: A | Discharge status: Home / Self Care | Payer: Medicaid Other | Source: Ambulatory Visit | Attending: Specialist | Admitting: Specialist

## 2019-08-19 ENCOUNTER — Other Ambulatory Visit: Payer: Self-pay

## 2019-08-19 DIAGNOSIS — Z1231 Encounter for screening mammogram for malignant neoplasm of breast: Secondary | ICD-10-CM

## 2019-08-26 ENCOUNTER — Ambulatory Visit (HOSPITAL_COMMUNITY): Payer: Medicaid Other

## 2019-09-03 ENCOUNTER — Encounter (HOSPITAL_COMMUNITY): Payer: Self-pay

## 2019-09-03 ENCOUNTER — Ambulatory Visit (HOSPITAL_COMMUNITY)
Admission: EM | Admit: 2019-09-03 | Discharge: 2019-09-03 | Disposition: A | Discharge status: Home / Self Care | Payer: Medicaid Other | Attending: Internal Medicine | Admitting: Internal Medicine

## 2019-09-03 ENCOUNTER — Other Ambulatory Visit: Payer: Self-pay

## 2019-09-03 DIAGNOSIS — K589 Irritable bowel syndrome without diarrhea: Secondary | ICD-10-CM | POA: Insufficient documentation

## 2019-09-03 DIAGNOSIS — F419 Anxiety disorder, unspecified: Secondary | ICD-10-CM | POA: Insufficient documentation

## 2019-09-03 DIAGNOSIS — J0100 Acute maxillary sinusitis, unspecified: Secondary | ICD-10-CM | POA: Diagnosis present

## 2019-09-03 DIAGNOSIS — J309 Allergic rhinitis, unspecified: Secondary | ICD-10-CM | POA: Diagnosis not present

## 2019-09-03 DIAGNOSIS — Z20828 Contact with and (suspected) exposure to other viral communicable diseases: Secondary | ICD-10-CM | POA: Insufficient documentation

## 2019-09-03 DIAGNOSIS — Z79899 Other long term (current) drug therapy: Secondary | ICD-10-CM | POA: Diagnosis not present

## 2019-09-03 DIAGNOSIS — R0982 Postnasal drip: Secondary | ICD-10-CM | POA: Insufficient documentation

## 2019-09-03 DIAGNOSIS — R0981 Nasal congestion: Secondary | ICD-10-CM

## 2019-09-03 DIAGNOSIS — H938X3 Other specified disorders of ear, bilateral: Secondary | ICD-10-CM | POA: Diagnosis not present

## 2019-09-03 DIAGNOSIS — Z794 Long term (current) use of insulin: Secondary | ICD-10-CM | POA: Insufficient documentation

## 2019-09-03 DIAGNOSIS — M797 Fibromyalgia: Secondary | ICD-10-CM | POA: Insufficient documentation

## 2019-09-03 DIAGNOSIS — F17210 Nicotine dependence, cigarettes, uncomplicated: Secondary | ICD-10-CM | POA: Insufficient documentation

## 2019-09-03 DIAGNOSIS — J3489 Other specified disorders of nose and nasal sinuses: Secondary | ICD-10-CM | POA: Diagnosis not present

## 2019-09-03 DIAGNOSIS — K219 Gastro-esophageal reflux disease without esophagitis: Secondary | ICD-10-CM | POA: Diagnosis not present

## 2019-09-03 DIAGNOSIS — E7800 Pure hypercholesterolemia, unspecified: Secondary | ICD-10-CM | POA: Diagnosis not present

## 2019-09-03 DIAGNOSIS — E119 Type 2 diabetes mellitus without complications: Secondary | ICD-10-CM | POA: Insufficient documentation

## 2019-09-03 DIAGNOSIS — M1990 Unspecified osteoarthritis, unspecified site: Secondary | ICD-10-CM | POA: Diagnosis not present

## 2019-09-03 DIAGNOSIS — Z7982 Long term (current) use of aspirin: Secondary | ICD-10-CM | POA: Insufficient documentation

## 2019-09-03 DIAGNOSIS — I10 Essential (primary) hypertension: Secondary | ICD-10-CM | POA: Diagnosis not present

## 2019-09-03 MED ORDER — AMOXICILLIN 875 MG PO TABS: 875.0000 mg | ORAL_TABLET | Freq: Two times a day (BID) | ORAL | 0 refills | Status: DC

## 2019-09-03 MED ORDER — FLUTICASONE PROPIONATE 50 MCG/ACT NA SUSP: 2.0000 | Freq: Every day | NASAL | 0 refills | Status: DC

## 2019-09-03 NOTE — ED Triage Notes (Signed)
Patient report having sinus pressure, chills and era pain for 2 weeks. Patient is taking Sudafed, Zyrtec and Mucinex.

## 2019-09-03 NOTE — ED Provider Notes (Signed)
MRN: [id number] DOB: [birth year]  Subjective:   Ellen Avery is a 53 y.o. female presenting for 2-week history of acute onset persistent and worsening sinus congestion, sinus pain, bilateral ear fullness and popping.  She also feels a postnasal drip that is causing some throat discomfort.  She has been using her allergy medicine every day including Zyrtec, pseudoephedrine and Mucinex.  She has a history of difficult to control allergies and is requesting a refill on her Flonase.  She is a smoker.  Denies history of asthma.    Current Facility-Administered Medications:  .  nicotine (NICODERM CQ - dosed in mg/24 hr) patch 14 mg, 14 mg, Transdermal, Daily, Carlis Abbott, Laura P, DO .  nicotine polacrilex (NICORETTE) gum 2 mg, 2 mg, Oral, PRN, [name], DO  Current Outpatient Medications:  .  cetirizine (ZYRTEC) 10 MG tablet, Take 10 mg by mouth daily., Disp: , Rfl:  .  dextromethorphan-guaiFENesin (MUCINEX DM) 30-600 MG 12hr tablet, Take 1 tablet by mouth 2 (two) times daily., Disp: , Rfl:  .  pseudoephedrine (SUDAFED) 60 MG tablet, Take 60 mg by mouth every 4 (four) hours as needed for congestion., Disp: , Rfl:  .  ACCU-CHEK AVIVA PLUS test strip, USE TO CHECK BLOOD SUGAR 4 TIMES DAILY, Disp: , Rfl: 2 .  aspirin EC 81 MG tablet, Take 81 mg by mouth daily., Disp: , Rfl:  .  Cholecalciferol (VITAMIN D3) 3000 units TABS, Take 3,000 Units by mouth daily., Disp: , Rfl:  .  cyclobenzaprine (FLEXERIL) 10 MG tablet, Take 1 tablet (10 mg total) by mouth 2 (two) times daily as needed for muscle spasms., Disp: 20 tablet, Rfl: 0 .  DULoxetine (CYMBALTA) 60 MG capsule, Take 60 mg by mouth daily. , Disp: , Rfl:  .  Famotidine (PEPCID AC PO), Take by mouth as needed., Disp: , Rfl:  .  fluticasone (FLONASE) 50 MCG/ACT nasal spray, Place 2 sprays into both nostrils daily., Disp: 16 g, Rfl: 0 .  gabapentin (NEURONTIN) 100 MG capsule, Take 100-200 mg by mouth See admin instructions. Take 100 mg in the morning, 100  mg at lunch, and 200 mg in the evening, Disp: , Rfl:  .  hydrOXYzine (ATARAX/VISTARIL) 25 MG tablet, Take 1 tablet (25 mg total) by mouth every 6 (six) hours as needed for anxiety., Disp: 20 tablet, Rfl: 0 .  insulin glargine (LANTUS) 100 UNIT/ML injection, Inject 50 Units into the skin at bedtime. , Disp: , Rfl:  .  JANUVIA 50 MG tablet, Take 50 mg by mouth daily., Disp: , Rfl:  .  losartan-hydrochlorothiazide (HYZAAR) 100-12.5 MG tablet, Take 1 tablet by mouth daily. , Disp: , Rfl:  .  magnesium hydroxide (MILK OF MAGNESIA) 400 MG/5ML suspension, Take by mouth daily as needed for mild constipation., Disp: , Rfl:  .  metFORMIN (GLUCOPHAGE) 1000 MG tablet, Take 1,000 mg by mouth daily with breakfast. , Disp: , Rfl:  .  metoprolol tartrate (LOPRESSOR) 50 MG tablet, Take 2 tablets (100 mg total) by mouth once for 1 dose. Take 2 hours prior to cardiac CTA., Disp: 2 tablet, Rfl: 0 .  simvastatin (ZOCOR) 40 MG tablet, Take 40 mg by mouth every evening. Reported on 03/01/2016, Disp: , Rfl:  .  sodium chloride (OCEAN) 0.65 % SOLN nasal spray, Place 1 spray into both nostrils as needed for congestion., Disp: 15 mL, Rfl: 0   Allergies  Allergen Reactions  . Flagyl [Metronidazole] Hives and Itching    Benadryl  Past Medical History:  Diagnosis Date  . Anxiety   . Arthritis   . Chronic headaches   . DDD (degenerative disc disease), cervical   . Fibromyalgia   . GERD (gastroesophageal reflux disease)   . History of colon polyps   . History of psychosis   . Hypercholesteremia   . Hypertension   . IBS (irritable bowel syndrome)   . MDD (major depressive disorder)   . Right sided facial pain   . Seasonal allergic rhinitis   . Sinus congestion    12-24-2018 per pt no fever  . Type 2 diabetes mellitus treated with insulin ([place])   . Vitamin D deficiency   . Wears dentures    full upper,  partial lower  . Wears glasses      Past Surgical History:  Procedure Laterality Date  . ARTERY BIOPSY  Right 12/28/2018   Procedure: RIGHT BIOPSY TEMPORAL ARTERY;  Surgeon: Kieth Brightly, Arta Bruce, MD;  Location: [facility];  Service: General;  Laterality: Right;  . CARDIAC CATHETERIZATION    . MULTIPLE TOOTH EXTRACTIONS  04/ 2019    sedation  . TOTAL ABDOMINAL HYSTERECTOMY W/ BILATERAL SALPINGOOPHORECTOMY  2009    Review of Systems  Constitutional: Positive for malaise/fatigue. Negative for fever.  HENT: Positive for congestion, ear pain, sinus pain and sore throat.   Eyes: Negative for discharge and redness.  Respiratory: Negative for cough, hemoptysis, shortness of breath and wheezing.   Cardiovascular: Negative for chest pain.  Gastrointestinal: Negative for abdominal pain, diarrhea, nausea and vomiting.  Genitourinary: Negative for dysuria, flank pain and hematuria.  Musculoskeletal: Negative for myalgias.  Skin: Negative for rash.  Neurological: Negative for dizziness, weakness and headaches.  Psychiatric/Behavioral: Negative for depression and substance abuse.    Objective:   Vitals: BP 102/83 (BP Location: Left Arm)   Pulse 78   Temp 97.6 F (36.4 C) (Temporal)   Resp 17   SpO2 97%   Physical Exam Constitutional:      General: She is not in acute distress.    Appearance: Normal appearance. She is well-developed. She is not ill-appearing, toxic-appearing or diaphoretic.  HENT:     Head: Normocephalic and atraumatic.     Right Ear: Ear canal normal. No drainage or tenderness. No middle ear effusion. Tympanic membrane is not erythematous.     Left Ear: Ear canal normal. No drainage or tenderness.  No middle ear effusion. Tympanic membrane is not erythematous.     Ears:     Comments: TMs opacified bilaterally.    Nose: Congestion (With right-sided maxillary sinus tenderness) present. No rhinorrhea.     Mouth/Throat:     Mouth: Mucous membranes are moist. No oral lesions.     Pharynx: Posterior oropharyngeal erythema (With associated postnasal drainage)  present. No pharyngeal swelling, oropharyngeal exudate or uvula swelling.     Tonsils: No tonsillar exudate or tonsillar abscesses.  Eyes:     General: No scleral icterus.       Right eye: No discharge.        Left eye: No discharge.     Extraocular Movements: Extraocular movements intact.     Right eye: Normal extraocular motion.     Left eye: Normal extraocular motion.     Conjunctiva/sclera: Conjunctivae normal.     Pupils: Pupils are equal, round, and reactive to light.  Neck:     Musculoskeletal: Normal range of motion and neck supple.  Cardiovascular:     Rate and Rhythm: Normal rate  and regular rhythm.     Pulses: Normal pulses.     Heart sounds: Normal heart sounds. No murmur. No friction rub. No gallop.   Pulmonary:     Effort: Pulmonary effort is normal. No respiratory distress.     Breath sounds: Normal breath sounds. No stridor. No wheezing, rhonchi or rales.  Lymphadenopathy:     Cervical: No cervical adenopathy.  Skin:    General: Skin is warm and dry.     Findings: No rash.  Neurological:     General: No focal deficit present.     Mental Status: She is alert and oriented to person, place, and time.  Psychiatric:        Mood and Affect: Mood normal.        Behavior: Behavior normal.        Thought Content: Thought content normal.        Judgment: Judgment normal.     Assessment and Plan :   1. Acute maxillary sinusitis, recurrence not specified   2. Sinus pain   3. Ear fullness, bilateral   4. Sinus congestion   5. Allergic rhinitis, unspecified seasonality, unspecified trigger     We will have patient start amoxicillin for 7 days to cover for sinusitis secondary to her allergic rhinitis.  She is to maintain all of her medications for her allergies.  I refilled her Flonase and advised she start this when she clears her current sinus infection.  COVID-19 testing is pending, low suspicion for this given that patient is social distancing very strictly.  Counseled patient on potential for adverse effects with medications prescribed/recommended today, ER and return-to-clinic precautions discussed, patient verbalized understanding.    [name], [place] 09/03/19 [id number]

## 2019-09-04 LAB — NOVEL CORONAVIRUS, NAA (HOSP ORDER, SEND-OUT TO REF LAB; TAT 18-24 HRS): SARS-CoV-2, NAA: NOT DETECTED

## 2019-09-05 NOTE — Progress Notes (Deleted)
Cardiology Office Note:    Date:  09/05/2019   ID:  Ellen Avery, DOB [birth year], MRN [id number]  PCP:  Javier Docker, MD  Cardiologist:  Shirlee More, MD    Referring MD: Javier Docker, MD    ASSESSMENT:    No diagnosis found. PLAN:    In order of problems listed above:  1. ***   Next appointment: ***   Medication Adjustments/Labs and Tests Ordered: Current medicines are reviewed at length with the patient today.  Concerns regarding medicines are outlined above.  No orders of the defined types were placed in this encounter.  No orders of the defined types were placed in this encounter.   No chief complaint on file.   History of Present Illness:    Ellen Avery is a 53 y.o. female with a hx of hypertension hyperlipidemia type 2 diabetes mellitus with chest pain and several emergency room visits in August 2020 last seen 07/20/2019 advise cardiac CTA that was old and subsequently declined by the patient.. Compliance with diet, lifestyle and medications: *** Past Medical History:  Diagnosis Date  . Anxiety   . Arthritis   . Chronic headaches   . DDD (degenerative disc disease), cervical   . Fibromyalgia   . GERD (gastroesophageal reflux disease)   . History of colon polyps   . History of psychosis   . Hypercholesteremia   . Hypertension   . IBS (irritable bowel syndrome)   . MDD (major depressive disorder)   . Right sided facial pain   . Seasonal allergic rhinitis   . Sinus congestion    12-24-2018 per pt no fever  . Type 2 diabetes mellitus treated with insulin ([place])   . Vitamin D deficiency   . Wears dentures    full upper,  partial lower  . Wears glasses     Past Surgical History:  Procedure Laterality Date  . ARTERY BIOPSY Right 12/28/2018   Procedure: RIGHT BIOPSY TEMPORAL ARTERY;  Surgeon: Kieth Brightly, Arta Bruce, MD;  Location: [facility];  Service: General;  Laterality: Right;  . CARDIAC CATHETERIZATION    .  MULTIPLE TOOTH EXTRACTIONS  04/ 2019    sedation  . TOTAL ABDOMINAL HYSTERECTOMY W/ BILATERAL SALPINGOOPHORECTOMY  2009    Current Medications: No outpatient medications have been marked as taking for the 09/06/19 encounter (Appointment) with Richardo Priest, MD.   Current Facility-Administered Medications for the 09/06/19 encounter (Appointment) with Richardo Priest, MD  Medication  . nicotine (NICODERM CQ - dosed in mg/24 hr) patch 14 mg  . nicotine polacrilex (NICORETTE) gum 2 mg     Allergies:   Flagyl [metronidazole]   Social History   Socioeconomic History  . Marital status: Divorced    Spouse name: Not on file  . Number of children: Not on file  . Years of education: Not on file  . Highest education level: Not on file  Occupational History  . Not on file  Social Needs  . Financial resource strain: Not on file  . Food insecurity    Worry: Not on file    Inability: Not on file  . Transportation needs    Medical: Not on file    Non-medical: Not on file  Tobacco Use  . Smoking status: Current Every Day Smoker    Packs/day: 1.00    Years: 35.00    Pack years: 35.00    Types: Cigarettes  . Smokeless tobacco: Never Used  Substance and Sexual Activity  .  Alcohol use: No  . Drug use: No  . Sexual activity: Not on file  Lifestyle  . Physical activity    Days per week: Not on file    Minutes per session: Not on file  . Stress: Not on file  Relationships  . Social Herbalist on phone: Not on file    Gets together: Not on file    Attends religious service: Not on file    Active member of club or organization: Not on file    Attends meetings of clubs or organizations: Not on file    Relationship status: Not on file  Other Topics Concern  . Not on file  Social History Narrative  . Not on file     Family History: The patient's ***family history includes Breast cancer in her maternal aunt and paternal grandmother; Cervical cancer in her paternal aunt;  Diabetes in her mother and paternal uncle; Head & neck cancer in her cousin; Heart Problems in her brother; Heart disease in her paternal grandmother. There is no history of Colon cancer, Colon polyps, Esophageal cancer, Rectal cancer, or Stomach cancer. ROS:   Please see the history of present illness.    All other systems reviewed and are negative.  EKGs/Labs/Other Studies Reviewed:    The following studies were reviewed today:  EKG:  EKG ordered today and personally reviewed.  The ekg ordered today demonstrates ***  Recent Labs: 07/10/2019: ALT 15 07/17/2019: BUN 7; Creatinine, Ser 0.71; Hemoglobin 13.0; Platelets 362; Potassium 3.9; Sodium 135  Recent Lipid Panel No results found for: CHOL, TRIG, HDL, CHOLHDL, VLDL, LDLCALC, LDLDIRECT  Physical Exam:    VS:  There were no vitals taken for this visit.    Wt Readings from Last 3 Encounters:  07/29/19 232 lb (105.2 kg)  07/20/19 232 lb 1.9 oz (105.3 kg)  07/17/19 240 lb (108.9 kg)     GEN: *** Well nourished, well developed in no acute distress HEENT: Normal NECK: No JVD; No carotid bruits LYMPHATICS: No lymphadenopathy CARDIAC: ***RRR, no murmurs, rubs, gallops RESPIRATORY:  Clear to auscultation without rales, wheezing or rhonchi  ABDOMEN: Soft, non-tender, non-distended MUSCULOSKELETAL:  No edema; No deformity  SKIN: Warm and dry NEUROLOGIC:  Alert and oriented x 3 PSYCHIATRIC:  Normal affect    Signed, Shirlee More, MD  09/05/2019 9:22 AM    [facility]

## 2019-09-06 ENCOUNTER — Ambulatory Visit: Payer: Medicaid Other | Admitting: Cardiology

## 2019-10-03 ENCOUNTER — Other Ambulatory Visit: Payer: Self-pay

## 2019-10-03 ENCOUNTER — Encounter (HOSPITAL_COMMUNITY): Payer: Self-pay

## 2019-10-03 ENCOUNTER — Ambulatory Visit (HOSPITAL_COMMUNITY)
Admission: EM | Admit: 2019-10-03 | Discharge: 2019-10-03 | Disposition: A | Discharge status: Home / Self Care | Payer: Medicaid Other | Attending: Family Medicine | Admitting: Family Medicine

## 2019-10-03 DIAGNOSIS — Z8049 Family history of malignant neoplasm of other genital organs: Secondary | ICD-10-CM | POA: Insufficient documentation

## 2019-10-03 DIAGNOSIS — Z8249 Family history of ischemic heart disease and other diseases of the circulatory system: Secondary | ICD-10-CM | POA: Insufficient documentation

## 2019-10-03 DIAGNOSIS — Z86010 Personal history of colonic polyps: Secondary | ICD-10-CM | POA: Diagnosis not present

## 2019-10-03 DIAGNOSIS — Z794 Long term (current) use of insulin: Secondary | ICD-10-CM | POA: Insufficient documentation

## 2019-10-03 DIAGNOSIS — Z79899 Other long term (current) drug therapy: Secondary | ICD-10-CM | POA: Insufficient documentation

## 2019-10-03 DIAGNOSIS — Z20828 Contact with and (suspected) exposure to other viral communicable diseases: Secondary | ICD-10-CM | POA: Diagnosis not present

## 2019-10-03 DIAGNOSIS — R05 Cough: Secondary | ICD-10-CM | POA: Diagnosis not present

## 2019-10-03 DIAGNOSIS — E7800 Pure hypercholesterolemia, unspecified: Secondary | ICD-10-CM | POA: Diagnosis not present

## 2019-10-03 DIAGNOSIS — M797 Fibromyalgia: Secondary | ICD-10-CM | POA: Insufficient documentation

## 2019-10-03 DIAGNOSIS — M1990 Unspecified osteoarthritis, unspecified site: Secondary | ICD-10-CM | POA: Diagnosis not present

## 2019-10-03 DIAGNOSIS — I10 Essential (primary) hypertension: Secondary | ICD-10-CM | POA: Diagnosis not present

## 2019-10-03 DIAGNOSIS — J069 Acute upper respiratory infection, unspecified: Secondary | ICD-10-CM | POA: Diagnosis present

## 2019-10-03 DIAGNOSIS — E785 Hyperlipidemia, unspecified: Secondary | ICD-10-CM | POA: Diagnosis not present

## 2019-10-03 DIAGNOSIS — Z90722 Acquired absence of ovaries, bilateral: Secondary | ICD-10-CM | POA: Insufficient documentation

## 2019-10-03 DIAGNOSIS — F17210 Nicotine dependence, cigarettes, uncomplicated: Secondary | ICD-10-CM | POA: Insufficient documentation

## 2019-10-03 DIAGNOSIS — M5030 Other cervical disc degeneration, unspecified cervical region: Secondary | ICD-10-CM | POA: Insufficient documentation

## 2019-10-03 DIAGNOSIS — Z808 Family history of malignant neoplasm of other organs or systems: Secondary | ICD-10-CM | POA: Diagnosis not present

## 2019-10-03 DIAGNOSIS — Z881 Allergy status to other antibiotic agents status: Secondary | ICD-10-CM | POA: Diagnosis not present

## 2019-10-03 DIAGNOSIS — E559 Vitamin D deficiency, unspecified: Secondary | ICD-10-CM | POA: Diagnosis not present

## 2019-10-03 DIAGNOSIS — F331 Major depressive disorder, recurrent, moderate: Secondary | ICD-10-CM | POA: Insufficient documentation

## 2019-10-03 DIAGNOSIS — F419 Anxiety disorder, unspecified: Secondary | ICD-10-CM | POA: Insufficient documentation

## 2019-10-03 DIAGNOSIS — Z833 Family history of diabetes mellitus: Secondary | ICD-10-CM | POA: Insufficient documentation

## 2019-10-03 DIAGNOSIS — K219 Gastro-esophageal reflux disease without esophagitis: Secondary | ICD-10-CM | POA: Insufficient documentation

## 2019-10-03 DIAGNOSIS — E119 Type 2 diabetes mellitus without complications: Secondary | ICD-10-CM | POA: Insufficient documentation

## 2019-10-03 DIAGNOSIS — Z803 Family history of malignant neoplasm of breast: Secondary | ICD-10-CM | POA: Insufficient documentation

## 2019-10-03 DIAGNOSIS — Z7982 Long term (current) use of aspirin: Secondary | ICD-10-CM | POA: Diagnosis not present

## 2019-10-03 MED ORDER — ALBUTEROL SULFATE HFA 108 (90 BASE) MCG/ACT IN AERS: 1.0000 | INHALATION_SPRAY | Freq: Four times a day (QID) | RESPIRATORY_TRACT | 0 refills | Status: DC | PRN

## 2019-10-03 NOTE — ED Provider Notes (Signed)
[facility]    CSN: [id number] Arrival date & time: 10/03/19  1648      History   Chief Complaint Chief Complaint  Patient presents with  . Sinusitis  . Nausea    HPI Ellen Avery is a 53 y.o. female.   Ellen Avery presents with complaints of congestion, ear popping and feeling full. Started yesterday while at church. Nausea. Never vomited. Cough. Body aches. Cough is productive of phlegm. No sore throat. No fevers. No rash. No diarrhea. No known ill contacts. No asthma or copd. Has tried tylenol, sudafed, nasal spray,  and allergy medication. These have helped briefly with symptoms. Chest feels congestion. No shortness of breath. Does not work outside of the home. History  Of anxiety, arthritis, headache, gerd, htn, DM. States her blood sugars have remained normal for her.      ROS per HPI, negative if not otherwise mentioned.      Past Medical History:  Diagnosis Date  . Anxiety   . Arthritis   . Chronic headaches   . DDD (degenerative disc disease), cervical   . Fibromyalgia   . GERD (gastroesophageal reflux disease)   . History of colon polyps   . History of psychosis   . Hypercholesteremia   . Hypertension   . IBS (irritable bowel syndrome)   . MDD (major depressive disorder)   . Right sided facial pain   . Seasonal allergic rhinitis   . Sinus congestion    12-24-2018 per pt no fever  . Type 2 diabetes mellitus treated with insulin ([place])   . Vitamin D deficiency   . Wears dentures    full upper,  partial lower  . Wears glasses     Patient Active Problem List   Diagnosis Date Noted  . Chest pain in adult 07/20/2019  . IDDM (insulin dependent diabetes mellitus) 07/08/2018  . HTN (hypertension) 07/08/2018  . HLD (hyperlipidemia) 07/08/2018  . Altered mental status 03/02/2016  . MDD (major depressive disorder), recurrent episode, moderate ([place]) 03/02/2016  . Psychoses [facility])     Past Surgical History:  Procedure Laterality Date  .  ARTERY BIOPSY Right 12/28/2018   Procedure: RIGHT BIOPSY TEMPORAL ARTERY;  Surgeon: Kieth Brightly, Arta Bruce, MD;  Location: [facility];  Service: General;  Laterality: Right;  . CARDIAC CATHETERIZATION    . MULTIPLE TOOTH EXTRACTIONS  04/ 2019    sedation  . TOTAL ABDOMINAL HYSTERECTOMY W/ BILATERAL SALPINGOOPHORECTOMY  2009    OB History   No obstetric history on file.      Home Medications    Prior to Admission medications   Medication Sig Start Date End Date Taking? Authorizing Provider  ACCU-CHEK AVIVA PLUS test strip USE TO CHECK BLOOD SUGAR 4 TIMES DAILY 05/25/18   [provider]  albuterol (PROAIR HFA) 108 (90 Base) MCG/ACT inhaler Inhale 1-2 puffs into the lungs every 6 (six) hours as needed for wheezing or shortness of breath. 10/03/19   Zigmund Gottron, NP  aspirin EC 81 MG tablet Take 81 mg by mouth daily.    [provider]  cetirizine (ZYRTEC) 10 MG tablet Take 10 mg by mouth daily.    [provider]  Cholecalciferol (VITAMIN D3) 3000 units TABS Take 3,000 Units by mouth daily.    [provider]  cyclobenzaprine (FLEXERIL) 10 MG tablet Take 1 tablet (10 mg total) by mouth 2 (two) times daily as needed for muscle spasms. 08/30/18   Frederica Kuster, PA-C  dextromethorphan-guaiFENesin (MUCINEX DM) 30-600 MG 12hr tablet Take 1 tablet by mouth 2 (two) times daily.    [provider]  DULoxetine (CYMBALTA) 60 MG capsule Take 60 mg by mouth daily.  12/07/18   [provider]  Famotidine (PEPCID AC PO) Take by mouth as needed.    [provider]  fluticasone (FLONASE) 50 MCG/ACT nasal spray Place 2 sprays into both nostrils daily. 09/03/19   [name], PA-C  gabapentin (NEURONTIN) 100 MG capsule Take 100-200 mg by mouth See admin instructions. Take 100 mg in the morning, 100 mg at lunch, and 200 mg in the evening 11/01/18   [provider]  hydrOXYzine (ATARAX/VISTARIL) 25 MG tablet Take 1 tablet  (25 mg total) by mouth every 6 (six) hours as needed for anxiety. 09/13/18   [name], [name], MD  insulin glargine (LANTUS) 100 UNIT/ML injection Inject 50 Units into the skin at bedtime.     [provider]  JANUVIA 50 MG tablet Take 50 mg by mouth daily. 07/07/19   [provider]  losartan-hydrochlorothiazide (HYZAAR) 100-12.5 MG tablet Take 1 tablet by mouth daily.  11/01/18   [provider]  magnesium hydroxide (MILK OF MAGNESIA) 400 MG/5ML suspension Take by mouth daily as needed for mild constipation.    [provider]  metFORMIN (GLUCOPHAGE) 1000 MG tablet Take 1,000 mg by mouth daily with breakfast.     [provider]  metoprolol tartrate (LOPRESSOR) 50 MG tablet Take 2 tablets (100 mg total) by mouth once for 1 dose. Take 2 hours prior to cardiac CTA. 07/20/19 07/20/19  Richardo Priest, MD  pseudoephedrine (SUDAFED) 60 MG tablet Take 60 mg by mouth every 4 (four) hours as needed for congestion.    [provider]  simvastatin (ZOCOR) 40 MG tablet Take 40 mg by mouth every evening. Reported on 03/01/2016    [provider]  sodium chloride (OCEAN) 0.65 % SOLN nasal spray Place 1 spray into both nostrils as needed for congestion. 08/30/18   Frederica Kuster, PA-C    Family History Family History  Problem Relation Age of Onset  . Diabetes Mother   . Heart disease Paternal Grandmother   . Breast cancer Paternal Grandmother   . Diabetes Paternal Uncle   . Heart Problems Brother   . Breast cancer Maternal Aunt   . Head & neck cancer Cousin   . Cervical cancer Paternal Aunt   . Colon cancer Neg Hx   . Colon polyps Neg Hx   . Esophageal cancer Neg Hx   . Rectal cancer Neg Hx   . Stomach cancer Neg Hx     Social History Social History   Tobacco Use  . Smoking status: Current Every Day Smoker    Packs/day: 1.00    Years: 35.00    Pack years: 35.00    Types: Cigarettes  . Smokeless tobacco: Never Used  Substance Use  Topics  . Alcohol use: No  . Drug use: No     Allergies   Flagyl [metronidazole]   Review of Systems Review of Systems   Physical Exam Triage Vital Signs ED Triage Vitals  Enc Vitals Group     BP 10/03/19 1847 118/73     Pulse Rate 10/03/19 1847 83     Resp 10/03/19 1847 17     Temp 10/03/19 1847 98.6 F (37 C)     Temp Source 10/03/19 1847 Oral     SpO2 10/03/19 1847 98 %  Weight --      Height --      Head Circumference --      Peak Flow --      Pain Score 10/03/19 1842 7     Pain Loc --      Pain Edu? --      Excl. in [place]? --    No data found.  Updated Vital Signs BP 118/73 (BP Location: Right Arm)   Pulse 83   Temp 98.6 F (37 C) (Oral)   Resp 17   SpO2 98%   Visual Acuity Right Eye Distance:   Left Eye Distance:   Bilateral Distance:    Right Eye Near:   Left Eye Near:    Bilateral Near:     Physical Exam Constitutional:      General: She is not in acute distress.    Appearance: She is well-developed.  HENT:     Right Ear: Tympanic membrane and ear canal normal.     Left Ear: Tympanic membrane and ear canal normal.     Nose: Rhinorrhea present.  Cardiovascular:     Rate and Rhythm: Normal rate and regular rhythm.     Heart sounds: Normal heart sounds.  Pulmonary:     Effort: Pulmonary effort is normal.     Breath sounds: Normal breath sounds.  Skin:    General: Skin is warm and dry.  Neurological:     Mental Status: She is alert and oriented to person, place, and time.      UC Treatments / Results  Labs (all labs ordered are listed, but only abnormal results are displayed) Labs Reviewed  NOVEL CORONAVIRUS, NAA (HOSP ORDER, SEND-OUT TO REF LAB; TAT 18-24 HRS)    EKG   Radiology No results found.  Procedures Procedures (including critical care time)  Medications Ordered in UC Medications - No data to display  Initial Impression / Assessment and Plan / UC Course  I have reviewed the triage vital signs and the nursing  notes.  Pertinent labs & imaging results that were available during my care of the patient were reviewed by me and considered in my medical decision making (see chart for details).     Non toxic. Benign physical exam. History and physical consistent with viral illness.  Supportive cares recommended. covid testing pending. Will notify of any positive findings and if any changes to treatment are needed.  Return precautions provided. Patient verbalized understanding and agreeable to plan.   Final Clinical Impressions(s) / UC Diagnoses   Final diagnoses:  Viral URI with cough     Discharge Instructions     Your exam is reassuring.  Still likely viral in nature, I would expect improvement over the next 7-10 days.  Push fluids to ensure adequate hydration and keep secretions thin.  Tylenol and/or ibuprofen as needed for pain or fevers.  Over the counter  medications such as mucinex as needed for symptoms.  Use of inhaler as needed for wheezing or shortness of breath.   Self isolate until covid results are back and negative.  Will notify you of any positive findings. You may monitor your results on your MyChart online as well. If symptoms worsen or do not improve in the next week to return to be seen or to follow up with your PCP.         ED Prescriptions    Medication Sig Dispense Auth. Provider   albuterol (PROAIR HFA) 108 (90 Base) MCG/ACT inhaler Inhale 1-2 puffs  into the lungs every 6 (six) hours as needed for wheezing or shortness of breath. 8 g Zigmund Gottron, NP     PDMP not reviewed this encounter.   Zigmund Gottron, NP 10/04/19 [phone]

## 2019-10-03 NOTE — Discharge Instructions (Signed)
Your exam is reassuring.  Still likely viral in nature, I would expect improvement over the next 7-10 days.  Push fluids to ensure adequate hydration and keep secretions thin.  Tylenol and/or ibuprofen as needed for pain or fevers.  Over the counter  medications such as mucinex as needed for symptoms.  Use of inhaler as needed for wheezing or shortness of breath.   Self isolate until covid results are back and negative.  Will notify you of any positive findings. You may monitor your results on your MyChart online as well. If symptoms worsen or do not improve in the next week to return to be seen or to follow up with your PCP.

## 2019-10-03 NOTE — ED Triage Notes (Signed)
Patient presents to Urgent Care with complaints of sinus pressure and nausea with a productive cough since yesterday while at church. Patient reports she has been taking tylenol for her symptoms.

## 2019-10-05 LAB — NOVEL CORONAVIRUS, NAA (HOSP ORDER, SEND-OUT TO REF LAB; TAT 18-24 HRS): SARS-CoV-2, NAA: NOT DETECTED

## 2019-10-25 ENCOUNTER — Other Ambulatory Visit: Payer: Self-pay

## 2019-10-25 ENCOUNTER — Ambulatory Visit (HOSPITAL_COMMUNITY)
Admission: EM | Admit: 2019-10-25 | Discharge: 2019-10-25 | Disposition: A | Discharge status: Home / Self Care | Payer: Medicaid Other | Attending: Emergency Medicine | Admitting: Emergency Medicine

## 2019-10-25 ENCOUNTER — Encounter (HOSPITAL_COMMUNITY): Payer: Self-pay

## 2019-10-25 DIAGNOSIS — J320 Chronic maxillary sinusitis: Secondary | ICD-10-CM

## 2019-10-25 DIAGNOSIS — G5602 Carpal tunnel syndrome, left upper limb: Secondary | ICD-10-CM | POA: Diagnosis not present

## 2019-10-25 MED ORDER — AMOXICILLIN-POT CLAVULANATE 875-125 MG PO TABS: 1.0000 | ORAL_TABLET | Freq: Two times a day (BID) | ORAL | 0 refills | Status: AC

## 2019-10-25 MED ORDER — CETIRIZINE-PSEUDOEPHEDRINE ER 5-120 MG PO TB12: 1.0000 | ORAL_TABLET | Freq: Every day | ORAL | 0 refills | Status: DC

## 2019-10-25 MED ORDER — PREDNISONE 10 MG PO TABS: 20.0000 mg | ORAL_TABLET | Freq: Every day | ORAL | 0 refills | Status: AC

## 2019-10-25 NOTE — Discharge Instructions (Addendum)
Rest and push fluids Zyrtec D prescribed.  Use daily for symptomatic relief Augmentin prescribed.  Take as directed and to completion Continue with OTC ibuprofen/tylenol as needed for pain Follow up with PCP or [facility] if symptoms persists Return or go to the ED if you have any new or worsening symptoms such as fever, chills, worsening sinus pain/pressure, cough, sore throat, chest pain, shortness of breath, abdominal pain, changes in bowel or bladder habits, etc...  Advise patient to take prednisone as prescribed for carpal tunnel syndrome Monitor blood sugar as steroid can increase blood sugar Follow up with orthopedic or PCP if symptom get worse

## 2019-10-25 NOTE — ED Triage Notes (Signed)
Patient presents to Urgent Care with complaints of continued sinus pressure and drainage since her last visit 3 weeks ago. Patient reports she also think she may have carpal tunnel in her left hand/wrist.

## 2019-10-25 NOTE — ED Provider Notes (Signed)
Ellen Avery    CSN: [id number] Arrival date & time: 10/25/19  1216      History   Chief Complaint Chief Complaint  Patient presents with  . Sinusitis        Carpal tunnel   HPI HANNALEY Ellen Avery is a 53 y.o. female.   The history was provided by the patient and no language interpreter was used  80 y old female  With a Hx of carpal tunnel syndrome presented with a complaint of left wrist pain for the past 2 days.  The radiate to her left fingers.She rated the pain at 7 on a scale of 1-10. She has used ibuprofen for treatment but it was ineffective. Cold weather make worst and she denied any trauma to her left wrist   Sinusitis Pain details:    Location:  Frontal and maxillary   Quality:  Pressure   Severity:  Moderate   Duration:  3 weeks Duration:  3 weeks Progression:  Worsening Chronicity:  New Context: allergies   Context: not recent URI   Context comment:  Seasonal allergy Relieved by:  Oral decongestants, acetaminophen and saline sprays Worsened by:  Nothing Ineffective treatments:  Acetaminophen, saline sprays and oral decongestants Associated symptoms: congestion, cough and ear pain   Associated symptoms: no chills, no fatigue, no fever, no nausea, no sore throat and no vomiting   Risk factors: allergic reaction   Risk factors: no asthma     Past Medical History:  Diagnosis Date  . Anxiety   . Arthritis   . Chronic headaches   . DDD (degenerative disc disease), cervical   . Fibromyalgia   . GERD (gastroesophageal reflux disease)   . History of colon polyps   . History of psychosis   . Hypercholesteremia   . Hypertension   . IBS (irritable bowel syndrome)   . MDD (major depressive disorder)   . Right sided facial pain   . Seasonal allergic rhinitis   . Sinus congestion    12-24-2018 per pt no fever  . Type 2 diabetes mellitus treated with insulin ([place])   . Vitamin D deficiency   . Wears dentures    full upper,  partial lower  . Wears  glasses     Patient Active Problem List   Diagnosis Date Noted  . Chest pain in adult 07/20/2019  . IDDM (insulin dependent diabetes mellitus) 07/08/2018  . HTN (hypertension) 07/08/2018  . HLD (hyperlipidemia) 07/08/2018  . Altered mental status 03/02/2016  . MDD (major depressive disorder), recurrent episode, moderate ([place]) 03/02/2016  . Psychoses [facility])     Past Surgical History:  Procedure Laterality Date  . ARTERY BIOPSY Right 12/28/2018   Procedure: RIGHT BIOPSY TEMPORAL ARTERY;  Surgeon: Kieth Brightly, Arta Bruce, MD;  Location: [place];  Service: General;  Laterality: Right;  . CARDIAC CATHETERIZATION    . MULTIPLE TOOTH EXTRACTIONS  04/ 2019    sedation  . TOTAL ABDOMINAL HYSTERECTOMY W/ BILATERAL SALPINGOOPHORECTOMY  2009    OB History   No obstetric history on file.      Home Medications    Prior to Admission medications   Medication Sig Start Date End Date Taking? Authorizing Provider  ACCU-CHEK AVIVA PLUS test strip USE TO CHECK BLOOD SUGAR 4 TIMES DAILY 05/25/18   [provider]  albuterol (PROAIR HFA) 108 (90 Base) MCG/ACT inhaler Inhale 1-2 puffs into the lungs every 6 (six) hours as needed for wheezing or shortness of breath. 10/03/19  Zigmund Gottron, NP  amoxicillin-clavulanate (AUGMENTIN) 875-125 MG tablet Take 1 tablet by mouth every 12 (twelve) hours for 7 days. 10/25/19 11/01/19  Emerson Monte, FNP  aspirin EC 81 MG tablet Take 81 mg by mouth daily.    [provider]  cetirizine (ZYRTEC) 10 MG tablet Take 10 mg by mouth daily.    [provider]  cetirizine-pseudoephedrine (ZYRTEC-D) 5-120 MG tablet Take 1 tablet by mouth daily. 10/25/19   Eniyah Eastmond, [name], FNP  Cholecalciferol (VITAMIN D3) 3000 units TABS Take 3,000 Units by mouth daily.    [provider]  cyclobenzaprine (FLEXERIL) 10 MG tablet Take 1 tablet (10 mg total) by mouth 2 (two) times daily as needed for muscle spasms. 08/30/18   Law,  [name], PA-C  dextromethorphan-guaiFENesin (MUCINEX DM) 30-600 MG 12hr tablet Take 1 tablet by mouth 2 (two) times daily.    [provider]  DULoxetine (CYMBALTA) 60 MG capsule Take 60 mg by mouth daily.  12/07/18   [provider]  Famotidine (PEPCID AC PO) Take by mouth as needed.    [provider]  fluticasone (FLONASE) 50 MCG/ACT nasal spray Place 2 sprays into both nostrils daily. 09/03/19   [name], PA-C  gabapentin (NEURONTIN) 100 MG capsule Take 100-200 mg by mouth See admin instructions. Take 100 mg in the morning, 100 mg at lunch, and 200 mg in the evening 11/01/18   [provider]  hydrOXYzine (ATARAX/VISTARIL) 25 MG tablet Take 1 tablet (25 mg total) by mouth every 6 (six) hours as needed for anxiety. 09/13/18   [name], [name], MD  insulin glargine (LANTUS) 100 UNIT/ML injection Inject 50 Units into the skin at bedtime.     [provider]  JANUVIA 50 MG tablet Take 50 mg by mouth daily. 07/07/19   [provider]  losartan-hydrochlorothiazide (HYZAAR) 100-12.5 MG tablet Take 1 tablet by mouth daily.  11/01/18   [provider]  magnesium hydroxide (MILK OF MAGNESIA) 400 MG/5ML suspension Take by mouth daily as needed for mild constipation.    [provider]  metFORMIN (GLUCOPHAGE) 1000 MG tablet Take 1,000 mg by mouth daily with breakfast.     [provider]  metoprolol tartrate (LOPRESSOR) 50 MG tablet Take 2 tablets (100 mg total) by mouth once for 1 dose. Take 2 hours prior to cardiac CTA. 07/20/19 07/20/19  Richardo Priest, MD  predniSONE (DELTASONE) 10 MG tablet Take 2 tablets (20 mg total) by mouth daily for 10 days. 10/25/19 11/04/19  Ekaterina Denise, [name], FNP  pseudoephedrine (SUDAFED) 60 MG tablet Take 60 mg by mouth every 4 (four) hours as needed for congestion.    [provider]  simvastatin (ZOCOR) 40 MG tablet Take 40 mg by mouth every evening. Reported on 03/01/2016     [provider]  sodium chloride (OCEAN) 0.65 % SOLN nasal spray Place 1 spray into both nostrils as needed for congestion. 08/30/18   Frederica Kuster, PA-C    Family History Family History  Problem Relation Age of Onset  . Diabetes Mother   . Heart disease Paternal Grandmother   . Breast cancer Paternal Grandmother   . Diabetes Paternal Uncle   . Heart Problems Brother   . Breast cancer Maternal Aunt   . Head & neck cancer Cousin   . Cervical cancer Paternal Aunt   . Colon cancer Neg Hx   . Colon polyps Neg Hx   . Esophageal cancer Neg Hx   .  Rectal cancer Neg Hx   . Stomach cancer Neg Hx     Social History Social History   Tobacco Use  . Smoking status: Current Every Day Smoker    Packs/day: 1.00    Years: 35.00    Pack years: 35.00    Types: Cigarettes  . Smokeless tobacco: Never Used  Substance Use Topics  . Alcohol use: No  . Drug use: No     Allergies   Flagyl [metronidazole]   Review of Systems Review of Systems  Constitutional: Negative for activity change, appetite change, chills, fatigue and fever.  HENT: Positive for congestion, ear pain and sinus pressure. Negative for ear discharge and sore throat.   Respiratory: Positive for cough. Negative for chest tightness.   Gastrointestinal: Negative for diarrhea, nausea and vomiting.  Musculoskeletal: Negative for back pain and joint swelling.       Left wrist pain  Neurological: Negative for dizziness.     Physical Exam Triage Vital Signs ED Triage Vitals  Enc Vitals Group     BP 10/25/19 1240 134/86     Pulse Rate 10/25/19 1240 86     Resp 10/25/19 1240 17     Temp 10/25/19 1240 99.1 F (37.3 C)     Temp Source 10/25/19 1240 Oral     SpO2 10/25/19 1240 100 %     Weight --      Height --      Head Circumference --      Peak Flow --      Pain Score 10/25/19 1238 7     Pain Loc --      Pain Edu? --      Excl. in [place]? --    No data found.  Updated Vital Signs BP 134/86 (BP  Location: Right Arm)   Pulse 86   Temp 99.1 F (37.3 C) (Oral)   Resp 17   SpO2 100%   Visual Acuity Right Eye Distance:   Left Eye Distance:   Bilateral Distance:    Right Eye Near:   Left Eye Near:    Bilateral Near:     Physical Exam Constitutional:      General: She is not in acute distress.    Appearance: Normal appearance. She is obese. She is not ill-appearing or toxic-appearing.  HENT:     Head: Normocephalic.     Right Ear: Tympanic membrane, ear canal and external ear normal.     Left Ear: Tympanic membrane, ear canal and external ear normal.     Nose: Nose normal. No congestion.     Mouth/Throat:     Mouth: Mucous membranes are moist.     Pharynx: No oropharyngeal exudate or posterior oropharyngeal erythema.  Cardiovascular:     Rate and Rhythm: Normal rate and regular rhythm.     Pulses: Normal pulses.     Heart sounds: Normal heart sounds. No murmur.  Pulmonary:     Effort: No respiratory distress.     Breath sounds: Normal breath sounds. No wheezing or rhonchi.  Musculoskeletal:     Left wrist: She exhibits decreased range of motion, tenderness and swelling.  Neurological:     Mental Status: She is alert.      UC Treatments / Results  Labs (all labs ordered are listed, but only abnormal results are displayed) Labs Reviewed - No data to display  EKG   Radiology No results found.  Procedures Procedures (including critical care time)  Medications Ordered in UC Medications -  No data to display  Initial Impression / Assessment and Plan / UC Course  I have reviewed the triage vital signs and the nursing notes.  Pertinent labs & imaging results that were available during my care of the patient were reviewed by me and considered in my medical decision making (see chart for details).   patient presented 3 weeks ago for  Sinusitis and was d/c without antibiotic. Her symptom hasn't resolved and has been worse since. Augmentin and zyrtec has been  prescribed for her at today visit.  She was advised to use prednisone for 10 days and to follow up with PCP or ortho if  her carpal tunnel syndrome symptom get worse Final Clinical Impressions(s) / UC Diagnoses   Final diagnoses:  Carpal tunnel syndrome of left wrist  Chronic maxillary sinusitis     Discharge Instructions     Rest and push fluids Zyrtec D prescribed.  Use daily for symptomatic relief Augmentin prescribed.  Take as directed and to completion Continue with OTC ibuprofen/tylenol as needed for pain Follow up with PCP or [facility] if symptoms persists Return or go to the ED if you have any new or worsening symptoms such as fever, chills, worsening sinus pain/pressure, cough, sore throat, chest pain, shortness of breath, abdominal pain, changes in bowel or bladder habits, etc...  Advise patient to take prednisone as prescribed for carpal tunnel syndrome Monitor blood sugar as steroid can increase blood sugar Follow up with orthopedic or PCP if symptom get worse    ED Prescriptions    Medication Sig Dispense Auth. Provider   amoxicillin-clavulanate (AUGMENTIN) 875-125 MG tablet Take 1 tablet by mouth every 12 (twelve) hours for 7 days. 14 tablet Jarissa Sheriff S, FNP   cetirizine-pseudoephedrine (ZYRTEC-D) 5-120 MG tablet Take 1 tablet by mouth daily. 30 tablet Vincy Feliz S, FNP   predniSONE (DELTASONE) 10 MG tablet Take 2 tablets (20 mg total) by mouth daily for 10 days. 20 tablet Anniyah Mood, [name], FNP     I have reviewed the PDMP during this encounter.   Emerson Monte, FNP 10/25/19 1401

## 2019-10-28 ENCOUNTER — Emergency Department (HOSPITAL_COMMUNITY)
Admission: EM | Admit: 2019-10-28 | Discharge: 2019-10-28 | Disposition: A | Discharge status: Home / Self Care | Payer: Medicaid Other | Attending: Emergency Medicine | Admitting: Emergency Medicine

## 2019-10-28 ENCOUNTER — Emergency Department (HOSPITAL_COMMUNITY): Payer: Medicaid Other

## 2019-10-28 DIAGNOSIS — Y999 Unspecified external cause status: Secondary | ICD-10-CM | POA: Diagnosis not present

## 2019-10-28 DIAGNOSIS — W2201XA Walked into wall, initial encounter: Secondary | ICD-10-CM | POA: Insufficient documentation

## 2019-10-28 DIAGNOSIS — Y9301 Activity, walking, marching and hiking: Secondary | ICD-10-CM | POA: Insufficient documentation

## 2019-10-28 DIAGNOSIS — Z794 Long term (current) use of insulin: Secondary | ICD-10-CM | POA: Diagnosis not present

## 2019-10-28 DIAGNOSIS — Y929 Unspecified place or not applicable: Secondary | ICD-10-CM | POA: Diagnosis not present

## 2019-10-28 DIAGNOSIS — S92515A Nondisplaced fracture of proximal phalanx of left lesser toe(s), initial encounter for closed fracture: Secondary | ICD-10-CM | POA: Diagnosis not present

## 2019-10-28 DIAGNOSIS — Z79899 Other long term (current) drug therapy: Secondary | ICD-10-CM | POA: Insufficient documentation

## 2019-10-28 DIAGNOSIS — I10 Essential (primary) hypertension: Secondary | ICD-10-CM | POA: Diagnosis not present

## 2019-10-28 DIAGNOSIS — F17210 Nicotine dependence, cigarettes, uncomplicated: Secondary | ICD-10-CM | POA: Diagnosis not present

## 2019-10-28 DIAGNOSIS — E119 Type 2 diabetes mellitus without complications: Secondary | ICD-10-CM | POA: Insufficient documentation

## 2019-10-28 DIAGNOSIS — Z7982 Long term (current) use of aspirin: Secondary | ICD-10-CM | POA: Insufficient documentation

## 2019-10-28 DIAGNOSIS — S99922A Unspecified injury of left foot, initial encounter: Secondary | ICD-10-CM | POA: Diagnosis present

## 2019-10-28 MED ORDER — ACETAMINOPHEN 325 MG PO TABS: 650.0000 mg | ORAL_TABLET | Freq: Once | ORAL | Status: DC

## 2019-10-28 NOTE — ED Triage Notes (Signed)
Pt reports injuring left foot pain this morning. No deformities noted. VSS.

## 2019-10-28 NOTE — ED Provider Notes (Signed)
[facility] EMERGENCY DEPARTMENT Provider Note   CSN: [id number] Arrival date & time: 10/28/19  0725     History   Chief Complaint Chief Complaint  Patient presents with  . Foot Pain    HPI Ellen Avery is a 53 y.o. female with past medical history significant for anxiety, arthritis, fibromyalgia, hypertension presents to emergency department today with chief complaint of left foot pain.  Onset was acute, starting 2 hours prior to arrival. Patient states she was half asleep when she was walking to the bathroom and she accidentally hit her left foot on the door frame.  She had immediate pain.  She states the pain is located in her toes.  She rates the pain 7 out of 10 in severity.  She took ibuprofen for pain prior to arrival.  She denies falling, hitting her head, LOC.  Also denies fever, chills, numbness, tingling, weakness.    Past Medical History:  Diagnosis Date  . Anxiety   . Arthritis   . Chronic headaches   . DDD (degenerative disc disease), cervical   . Fibromyalgia   . GERD (gastroesophageal reflux disease)   . History of colon polyps   . History of psychosis   . Hypercholesteremia   . Hypertension   . IBS (irritable bowel syndrome)   . MDD (major depressive disorder)   . Right sided facial pain   . Seasonal allergic rhinitis   . Sinus congestion    12-24-2018 per pt no fever  . Type 2 diabetes mellitus treated with insulin ([place])   . Vitamin D deficiency   . Wears dentures    full upper,  partial lower  . Wears glasses     Patient Active Problem List   Diagnosis Date Noted  . Chest pain in adult 07/20/2019  . IDDM (insulin dependent diabetes mellitus) 07/08/2018  . HTN (hypertension) 07/08/2018  . HLD (hyperlipidemia) 07/08/2018  . Altered mental status 03/02/2016  . MDD (major depressive disorder), recurrent episode, moderate ([name]) 03/02/2016  . Psychoses [facility])     Past Surgical History:  Procedure Laterality Date  . ARTERY  BIOPSY Right 12/28/2018   Procedure: RIGHT BIOPSY TEMPORAL ARTERY;  Surgeon: Kieth Brightly, Arta Bruce, MD;  Location: [place];  Service: General;  Laterality: Right;  . CARDIAC CATHETERIZATION    . MULTIPLE TOOTH EXTRACTIONS  04/ 2019    sedation  . TOTAL ABDOMINAL HYSTERECTOMY W/ BILATERAL SALPINGOOPHORECTOMY  2009     OB History   No obstetric history on file.      Home Medications    Prior to Admission medications   Medication Sig Start Date End Date Taking? Authorizing Provider  ACCU-CHEK AVIVA PLUS test strip USE TO CHECK BLOOD SUGAR 4 TIMES DAILY 05/25/18   [provider]  albuterol (PROAIR HFA) 108 (90 Base) MCG/ACT inhaler Inhale 1-2 puffs into the lungs every 6 (six) hours as needed for wheezing or shortness of breath. 10/03/19   Zigmund Gottron, NP  amoxicillin-clavulanate (AUGMENTIN) 875-125 MG tablet Take 1 tablet by mouth every 12 (twelve) hours for 7 days. 10/25/19 11/01/19  Emerson Monte, FNP  aspirin EC 81 MG tablet Take 81 mg by mouth daily.    [provider]  cetirizine (ZYRTEC) 10 MG tablet Take 10 mg by mouth daily.    [provider]  cetirizine-pseudoephedrine (ZYRTEC-D) 5-120 MG tablet Take 1 tablet by mouth daily. 10/25/19   Avegno, [name], FNP  Cholecalciferol (VITAMIN D3) 3000 units TABS  Take 3,000 Units by mouth daily.    [provider]  cyclobenzaprine (FLEXERIL) 10 MG tablet Take 1 tablet (10 mg total) by mouth 2 (two) times daily as needed for muscle spasms. 08/30/18   Law, [name], PA-C  dextromethorphan-guaiFENesin (MUCINEX DM) 30-600 MG 12hr tablet Take 1 tablet by mouth 2 (two) times daily.    [provider]  DULoxetine (CYMBALTA) 60 MG capsule Take 60 mg by mouth daily.  12/07/18   [provider]  Famotidine (PEPCID AC PO) Take by mouth as needed.    [provider]  fluticasone (FLONASE) 50 MCG/ACT nasal spray Place 2 sprays into both nostrils daily. 09/03/19    [name], PA-C  gabapentin (NEURONTIN) 100 MG capsule Take 100-200 mg by mouth See admin instructions. Take 100 mg in the morning, 100 mg at lunch, and 200 mg in the evening 11/01/18   [provider]  hydrOXYzine (ATARAX/VISTARIL) 25 MG tablet Take 1 tablet (25 mg total) by mouth every 6 (six) hours as needed for anxiety. 09/13/18   [name], [name], MD  insulin glargine (LANTUS) 100 UNIT/ML injection Inject 50 Units into the skin at bedtime.     [provider]  JANUVIA 50 MG tablet Take 50 mg by mouth daily. 07/07/19   [provider]  losartan-hydrochlorothiazide (HYZAAR) 100-12.5 MG tablet Take 1 tablet by mouth daily.  11/01/18   [provider]  magnesium hydroxide (MILK OF MAGNESIA) 400 MG/5ML suspension Take by mouth daily as needed for mild constipation.    [provider]  metFORMIN (GLUCOPHAGE) 1000 MG tablet Take 1,000 mg by mouth daily with breakfast.     [provider]  metoprolol tartrate (LOPRESSOR) 50 MG tablet Take 2 tablets (100 mg total) by mouth once for 1 dose. Take 2 hours prior to cardiac CTA. 07/20/19 07/20/19  Richardo Priest, MD  predniSONE (DELTASONE) 10 MG tablet Take 2 tablets (20 mg total) by mouth daily for 10 days. 10/25/19 11/04/19  Avegno, [name], FNP  pseudoephedrine (SUDAFED) 60 MG tablet Take 60 mg by mouth every 4 (four) hours as needed for congestion.    [provider]  simvastatin (ZOCOR) 40 MG tablet Take 40 mg by mouth every evening. Reported on 03/01/2016    [provider]  sodium chloride (OCEAN) 0.65 % SOLN nasal spray Place 1 spray into both nostrils as needed for congestion. 08/30/18   Frederica Kuster, PA-C    Family History Family History  Problem Relation Age of Onset  . Diabetes Mother   . Heart disease Paternal Grandmother   . Breast cancer Paternal Grandmother   . Diabetes Paternal Uncle   . Heart Problems Brother   . Breast cancer Maternal Aunt   . Head & neck  cancer Cousin   . Cervical cancer Paternal Aunt   . Colon cancer Neg Hx   . Colon polyps Neg Hx   . Esophageal cancer Neg Hx   . Rectal cancer Neg Hx   . Stomach cancer Neg Hx     Social History Social History   Tobacco Use  . Smoking status: Current Every Day Smoker    Packs/day: 1.00    Years: 35.00    Pack years: 35.00    Types: Cigarettes  . Smokeless tobacco: Never Used  Substance Use Topics  . Alcohol use: No  . Drug use: No     Allergies   Flagyl [metronidazole]   Review of Systems Review of Systems  Constitutional: Negative for chills and fever.  Gastrointestinal: Negative for abdominal pain, nausea and vomiting.  Musculoskeletal: Positive for arthralgias.  Skin: Negative for rash and wound.  Allergic/Immunologic: Positive for immunocompromised state (Diabetic).  Neurological: Negative for dizziness, weakness, light-headedness, numbness and headaches.     Physical Exam Updated Vital Signs BP 124/79 (BP Location: Right Arm)   Pulse 94   Temp 98.6 F (37 C) (Oral)   Resp 18   SpO2 99%   Physical Exam Vitals signs and nursing note reviewed.  Constitutional:      Appearance: She is well-developed. She is not ill-appearing or toxic-appearing.  HENT:     Head: Normocephalic and atraumatic.     Nose: Nose normal.  Eyes:     General: No scleral icterus.       Right eye: No discharge.        Left eye: No discharge.     Conjunctiva/sclera: Conjunctivae normal.  Neck:     Musculoskeletal: Normal range of motion.     Vascular: No JVD.  Cardiovascular:     Rate and Rhythm: Normal rate and regular rhythm.     Pulses: Normal pulses.          Dorsalis pedis pulses are 2+ on the right side and 2+ on the left side.     Heart sounds: Normal heart sounds.  Pulmonary:     Effort: Pulmonary effort is normal.     Breath sounds: Normal breath sounds.  Abdominal:     General: There is no distension.  Musculoskeletal: Normal range of motion.     Left hip:  Normal.     Left knee: Normal.     Left ankle: Normal.       Feet:  Feet:     Comments: Full range of motion of all toes and left ankle.  Able to wiggle toes.  Brisk cap refill. Neurovascularly intact distally. Compartments soft above and below affected joint.   Skin:    General: Skin is warm and dry.  Neurological:     Mental Status: She is oriented to person, place, and time.     GCS: GCS eye subscore is 4. GCS verbal subscore is 5. GCS motor subscore is 6.     Comments: Fluent speech, no facial droop.  Psychiatric:        Behavior: Behavior normal.      ED Treatments / Results  Labs (all labs ordered are listed, but only abnormal results are displayed) Labs Reviewed - No data to display  EKG None  Radiology Dg Foot Complete Left  Result Date: 10/28/2019 CLINICAL DATA:  Pain after hitting foot against solid object EXAM: LEFT FOOT - COMPLETE 3+ VIEW COMPARISON:  None. FINDINGS: Frontal, oblique, and lateral views were obtained. There is an obliquely oriented fracture through the midportion of the third proximal phalanx with alignment near anatomic. No other fractures are evident. No dislocation. Joint spaces appear normal. No erosive change. There is a small inferior calcaneal spur. IMPRESSION: Obliquely oriented fracture through the midportion of the third proximal phalanx with alignment near anatomic. No other fracture. No dislocation. No appreciable arthropathy. Small small inferior calcaneal spur. Electronically Signed   By: Lowella Grip III M.D.   On: 10/28/2019 08:05    Procedures Procedures (including critical care time)  Medications Ordered in ED Medications  acetaminophen (TYLENOL) tablet 650 mg (has no administration in time range)     Initial Impression / Assessment and Plan / ED Course  I  have reviewed the triage vital signs and the nursing notes.  Pertinent labs & imaging results that were available during my care of the patient were reviewed by me  and considered in my medical decision making (see chart for details).  Patient presents to the ED with complaints of pain to the left foot pain s/p injury walking into door frame. Exam without obvious deformity or open wounds. ROM intact. Tender to palpation. NVI distally. Xray viewed by me shows fracture of midportion of third proximal phalanx with alignment near anatomic.  Toes buddy taped and postop shoe given.  Tylenol given for pain with symptom improvement.  Recommend Tylenol and ibuprofen for pain . I discussed results, treatment plan, need for follow-up, and return precautions with the patient. Provided opportunity for questions, patient confirmed understanding and are in agreement with plan.    Portions of this note were generated with [employer]. Dictation errors may occur despite best attempts at proofreading.  Final Clinical Impressions(s) / ED Diagnoses   Final diagnoses:  Closed nondisplaced fracture of proximal phalanx of lesser toe of left foot, initial encounter    ED Discharge Orders    None       Cherre Robins, PA-C 10/28/19 Griggs, Ashley, DO 10/28/19 [id number]

## 2019-10-28 NOTE — Discharge Instructions (Signed)
You were seen today for pain in your left foot. Please read and follow all provided instructions.   Your x-ray today shows you have a fracture in your third toe.  1. Medications: alternate ibuprofen and tylenol for pain control, usual home medications  2. Treatment: rest, ice, elevate and use brace, drink plenty of fluids, gentle stretching  3. Follow Up: Please followup with orthopedics as directed or your PCP in 1 week if no improvement for discussion of your diagnoses and further evaluation after today's visit; if you do not have a primary care doctor use the resource guide provided to find one; Please return to the ER for worsening symptoms or other concerns

## 2019-11-01 ENCOUNTER — Ambulatory Visit (HOSPITAL_COMMUNITY): Payer: Medicaid Other

## 2019-11-01 ENCOUNTER — Telehealth: Payer: Self-pay | Admitting: Critical Care Medicine

## 2019-11-01 NOTE — Telephone Encounter (Signed)
Prior authorization completed -67-month follow-up CT chest approved.  Authorization code: [id number]  [name], DO 11/01/19 5:28 PM [facility]

## 2019-11-08 ENCOUNTER — Other Ambulatory Visit: Payer: Self-pay

## 2019-11-08 ENCOUNTER — Ambulatory Visit (HOSPITAL_COMMUNITY): Payer: Medicaid Other

## 2019-11-08 ENCOUNTER — Ambulatory Visit (HOSPITAL_COMMUNITY)
Admission: RE | Admit: 2019-11-08 | Discharge: 2019-11-08 | Disposition: A | Discharge status: Home / Self Care | Payer: Medicaid Other | Source: Ambulatory Visit | Attending: Critical Care Medicine | Admitting: Critical Care Medicine

## 2019-11-08 DIAGNOSIS — R911 Solitary pulmonary nodule: Secondary | ICD-10-CM | POA: Diagnosis not present

## 2019-11-10 ENCOUNTER — Telehealth: Payer: Self-pay | Admitting: Critical Care Medicine

## 2019-11-10 DIAGNOSIS — R911 Solitary pulmonary nodule: Secondary | ICD-10-CM

## 2019-11-10 NOTE — Telephone Encounter (Signed)
I spoke to Ellen Avery to let her know that her CT scan is stable.  She will need repeat CT scan in 6 to 12 months.  She continues to smoke and has no current plans to quit.  I encouraged her to keep thinking about this is quitting smoking is very important for her health.  We will see her back in clinic after her follow-up CT scan in June 2021.  [name], DO 11/10/19 9:44 AM [facility]

## 2019-11-10 NOTE — Telephone Encounter (Signed)
[place] placed recall.   Nothing further needed at this time.

## 2019-11-24 DIAGNOSIS — E1165 Type 2 diabetes mellitus with hyperglycemia: Secondary | ICD-10-CM | POA: Diagnosis not present

## 2019-11-24 DIAGNOSIS — I10 Essential (primary) hypertension: Secondary | ICD-10-CM | POA: Diagnosis not present

## 2019-11-24 DIAGNOSIS — E782 Mixed hyperlipidemia: Secondary | ICD-10-CM | POA: Diagnosis not present

## 2019-12-03 ENCOUNTER — Ambulatory Visit (HOSPITAL_COMMUNITY)
Admission: EM | Admit: 2019-12-03 | Discharge: 2019-12-03 | Disposition: A | Discharge status: Home / Self Care | Payer: Medicaid Other | Attending: Family Medicine | Admitting: Family Medicine

## 2019-12-03 ENCOUNTER — Encounter (HOSPITAL_COMMUNITY): Payer: Self-pay

## 2019-12-03 ENCOUNTER — Ambulatory Visit (INDEPENDENT_AMBULATORY_CARE_PROVIDER_SITE_OTHER): Payer: Medicaid Other

## 2019-12-03 ENCOUNTER — Other Ambulatory Visit: Payer: Self-pay

## 2019-12-03 DIAGNOSIS — S92512A Displaced fracture of proximal phalanx of left lesser toe(s), initial encounter for closed fracture: Secondary | ICD-10-CM | POA: Diagnosis not present

## 2019-12-03 NOTE — Discharge Instructions (Signed)
Your x-ray is showing a fracture the same as previous.  It appears to be slightly worse than before. I am placing you in a cam walker and you need to follow-up with orthopedics. Ibuprofen for pain as needed Rest, ice the area

## 2019-12-03 NOTE — ED Notes (Signed)
Initially pt stated she could not tolerating having her left foot flat in cam walker; discussed with T. Bast, NP; if pt unable to tolerate cam walker, then she must have crutches and be non-weightbearing.  Attempted crutches per pt preference, then pt decided she would rather tolerate cam walker instead.

## 2019-12-03 NOTE — ED Triage Notes (Signed)
Pt presents to the UC with nasal congestion x 1 week. Pt reports she is having pain in the left third toe x 1 month. Pt reports she broke her left third  toe 1 month ago, and the pain has not stopped since.

## 2019-12-04 NOTE — ED Provider Notes (Signed)
[place]    CSN: [id number] Arrival date & time: 12/03/19  1108      History   Chief Complaint Chief Complaint  Patient presents with  . Nasal Congestion  . Foot Pain    HPI Ellen Avery is a 53 y.o. female.   Patient is a 53 year old female presents today with continued left foot pain.  Reporting she broke the left third toe approximate 1 month ago and now is having worsening pain and swelling.  States she wore a postop boot for approximate 3 weeks and is started wearing regular shoes again because the foot was feeling better.  Pain and swelling is worse now than initial fracture.  She is unable to bear weight on the foot.  No new injuries.   ROS per HPI    Foot Pain    Past Medical History:  Diagnosis Date  . Anxiety   . Arthritis   . Chronic headaches   . DDD (degenerative disc disease), cervical   . Fibromyalgia   . GERD (gastroesophageal reflux disease)   . History of colon polyps   . History of psychosis   . Hypercholesteremia   . Hypertension   . IBS (irritable bowel syndrome)   . MDD (major depressive disorder)   . Right sided facial pain   . Seasonal allergic rhinitis   . Sinus congestion    12-24-2018 per pt no fever  . Type 2 diabetes mellitus treated with insulin ([place])   . Vitamin D deficiency   . Wears dentures    full upper,  partial lower  . Wears glasses     Patient Active Problem List   Diagnosis Date Noted  . Chest pain in adult 07/20/2019  . IDDM (insulin dependent diabetes mellitus) 07/08/2018  . HTN (hypertension) 07/08/2018  . HLD (hyperlipidemia) 07/08/2018  . Altered mental status 03/02/2016  . MDD (major depressive disorder), recurrent episode, moderate ([place]) 03/02/2016  . Psychoses [facility]     Past Surgical History:  Procedure Laterality Date  . ARTERY BIOPSY Right 12/28/2018   Procedure: RIGHT BIOPSY TEMPORAL ARTERY;  Surgeon: Kieth Brightly, Arta Bruce, MD;  Location: [place];  Service:  General;  Laterality: Right;  . CARDIAC CATHETERIZATION    . MULTIPLE TOOTH EXTRACTIONS  04/ 2019    sedation  . TOTAL ABDOMINAL HYSTERECTOMY W/ BILATERAL SALPINGOOPHORECTOMY  2009    OB History   No obstetric history on file.      Home Medications    Prior to Admission medications   Medication Sig Start Date End Date Taking? Authorizing Provider  ACCU-CHEK AVIVA PLUS test strip USE TO CHECK BLOOD SUGAR 4 TIMES DAILY 05/25/18   [provider]  albuterol (PROAIR HFA) 108 (90 Base) MCG/ACT inhaler Inhale 1-2 puffs into the lungs every 6 (six) hours as needed for wheezing or shortness of breath. 10/03/19   Zigmund Gottron, NP  aspirin EC 81 MG tablet Take 81 mg by mouth daily.    [provider]  cetirizine (ZYRTEC) 10 MG tablet Take 10 mg by mouth daily.    [provider]  cetirizine-pseudoephedrine (ZYRTEC-D) 5-120 MG tablet Take 1 tablet by mouth daily. 10/25/19   Avegno, [name], FNP  Cholecalciferol (VITAMIN D3) 3000 units TABS Take 3,000 Units by mouth daily.    [provider]  cyclobenzaprine (FLEXERIL) 10 MG tablet Take 1 tablet (10 mg total) by mouth 2 (two) times daily as needed for muscle spasms. 08/30/18   Eliezer Mccoy  M, PA-C  dextromethorphan-guaiFENesin (MUCINEX DM) 30-600 MG 12hr tablet Take 1 tablet by mouth 2 (two) times daily.    [provider]  DULoxetine (CYMBALTA) 60 MG capsule Take 60 mg by mouth daily.  12/07/18   [provider]  Famotidine (PEPCID AC PO) Take by mouth as needed.    [provider]  fluticasone (FLONASE) 50 MCG/ACT nasal spray Place 2 sprays into both nostrils daily. 09/03/19   [name], PA-C  gabapentin (NEURONTIN) 100 MG capsule Take 100-200 mg by mouth See admin instructions. Take 100 mg in the morning, 100 mg at lunch, and 200 mg in the evening 11/01/18   [provider]  hydrOXYzine (ATARAX/VISTARIL) 25 MG tablet Take 1 tablet (25 mg total) by mouth every 6 (six)  hours as needed for anxiety. 09/13/18   [name], [name], MD  insulin glargine (LANTUS) 100 UNIT/ML injection Inject 50 Units into the skin at bedtime.     [provider]  JANUVIA 50 MG tablet Take 50 mg by mouth daily. 07/07/19   [provider]  losartan-hydrochlorothiazide (HYZAAR) 100-12.5 MG tablet Take 1 tablet by mouth daily.  11/01/18   [provider]  magnesium hydroxide (MILK OF MAGNESIA) 400 MG/5ML suspension Take by mouth daily as needed for mild constipation.    [provider]  metFORMIN (GLUCOPHAGE) 1000 MG tablet Take 1,000 mg by mouth daily with breakfast.     [provider]  metoprolol tartrate (LOPRESSOR) 50 MG tablet Take 2 tablets (100 mg total) by mouth once for 1 dose. Take 2 hours prior to cardiac CTA. 07/20/19 07/20/19  Richardo Priest, MD  pseudoephedrine (SUDAFED) 60 MG tablet Take 60 mg by mouth every 4 (four) hours as needed for congestion.    [provider]  simvastatin (ZOCOR) 40 MG tablet Take 40 mg by mouth every evening. Reported on 03/01/2016    [provider]  sodium chloride (OCEAN) 0.65 % SOLN nasal spray Place 1 spray into both nostrils as needed for congestion. 08/30/18   Frederica Kuster, PA-C    Family History Family History  Problem Relation Age of Onset  . Diabetes Mother   . Heart disease Paternal Grandmother   . Breast cancer Paternal Grandmother   . Diabetes Paternal Uncle   . Heart Problems Brother   . Breast cancer Maternal Aunt   . Head & neck cancer Cousin   . Cervical cancer Paternal Aunt   . Colon cancer Neg Hx   . Colon polyps Neg Hx   . Esophageal cancer Neg Hx   . Rectal cancer Neg Hx   . Stomach cancer Neg Hx     Social History Social History   Tobacco Use  . Smoking status: Current Every Day Smoker    Packs/day: 1.00    Years: 35.00    Pack years: 35.00    Types: Cigarettes  . Smokeless tobacco: Never Used  Substance Use Topics  . Alcohol use: No  . Drug  use: No     Allergies   Flagyl [metronidazole]   Review of Systems Review of Systems   Physical Exam Triage Vital Signs ED Triage Vitals  Enc Vitals Group     BP 12/03/19 1257 131/85     Pulse Rate 12/03/19 1257 75     Resp 12/03/19 1257 16     Temp 12/03/19 1257 98.5 F (36.9 C)     Temp Source 12/03/19 1257 Oral     SpO2 12/03/19 1257  95 %     Weight --      Height --      Head Circumference --      Peak Flow --      Pain Score 12/03/19 1255 7     Pain Loc --      Pain Edu? --      Excl. in [place]? --    No data found.  Updated Vital Signs BP 131/85 (BP Location: Left Arm)   Pulse 75   Temp 98.5 F (36.9 C) (Oral)   Resp 16   SpO2 95%   Visual Acuity Right Eye Distance:   Left Eye Distance:   Bilateral Distance:    Right Eye Near:   Left Eye Near:    Bilateral Near:     Physical Exam Vitals and nursing note reviewed.  Constitutional:      General: She is not in acute distress.    Appearance: Normal appearance. She is not ill-appearing, toxic-appearing or diaphoretic.  HENT:     Head: Normocephalic.     Nose: Nose normal.     Mouth/Throat:     Pharynx: Oropharynx is clear.  Eyes:     Conjunctiva/sclera: Conjunctivae normal.  Pulmonary:     Effort: Pulmonary effort is normal.  Musculoskeletal:     Cervical back: Normal range of motion.     Left foot: Decreased range of motion. Normal capillary refill. Swelling, tenderness and bony tenderness present. Normal pulse.       Legs:  Skin:    General: Skin is warm and dry.     Findings: No rash.  Neurological:     Mental Status: She is alert.  Psychiatric:        Mood and Affect: Mood normal.      UC Treatments / Results  Labs (all labs ordered are listed, but only abnormal results are displayed) Labs Reviewed - No data to display  EKG   Radiology DG Foot Complete Left  Result Date: 12/03/2019 CLINICAL DATA:  Left foot injury 1 month ago with pain. EXAM: LEFT FOOT - COMPLETE 3+ VIEW  COMPARISON:  October 28, 2019 FINDINGS: There is displaced fracture of the third proximal phalanx without significant callus formation. There is no dislocation. IMPRESSION: There is displaced fracture of the third proximal phalanx without significant callus formation. There is no dislocation. Electronically Signed   By: Abelardo Diesel M.D.   On: 12/03/2019 13:46    Procedures Procedures (including critical care time)  Medications Ordered in UC Medications - No data to display  Initial Impression / Assessment and Plan / UC Course  I have reviewed the triage vital signs and the nursing notes.  Pertinent labs & imaging results that were available during my care of the patient were reviewed by me and considered in my medical decision making (see chart for details).     Close displaced fracture of the third proximal phalanx of the left foot.  Same as previous fracture that has not healed or has worsened. We will place patient in cam walker and give her crutches for nonweightbearing.  Recommended follow-up with orthopedics on Monday. Rest, ice, elevate and ibuprofen for pain. Final Clinical Impressions(s) / UC Diagnoses   Final diagnoses:  Closed displaced fracture of proximal phalanx of lesser toe of left foot, initial encounter     Discharge Instructions     Your x-ray is showing a fracture the same as previous.  It appears to be slightly worse than before. I  am placing you in a cam walker and you need to follow-up with orthopedics. Ibuprofen for pain as needed Rest, ice the area    ED Prescriptions    None     PDMP not reviewed this encounter.   Loura Halt A, NP 12/04/19 1457

## 2020-01-10 ENCOUNTER — Ambulatory Visit (HOSPITAL_COMMUNITY)
Admission: EM | Admit: 2020-01-10 | Discharge: 2020-01-10 | Disposition: A | Discharge status: Home / Self Care | Payer: Medicaid Other | Attending: Family Medicine | Admitting: Family Medicine

## 2020-01-10 ENCOUNTER — Encounter (HOSPITAL_COMMUNITY): Payer: Self-pay

## 2020-01-10 ENCOUNTER — Other Ambulatory Visit: Payer: Self-pay

## 2020-01-10 DIAGNOSIS — I10 Essential (primary) hypertension: Secondary | ICD-10-CM | POA: Insufficient documentation

## 2020-01-10 DIAGNOSIS — E785 Hyperlipidemia, unspecified: Secondary | ICD-10-CM | POA: Insufficient documentation

## 2020-01-10 DIAGNOSIS — F17210 Nicotine dependence, cigarettes, uncomplicated: Secondary | ICD-10-CM | POA: Insufficient documentation

## 2020-01-10 DIAGNOSIS — Z79899 Other long term (current) drug therapy: Secondary | ICD-10-CM | POA: Diagnosis not present

## 2020-01-10 DIAGNOSIS — J22 Unspecified acute lower respiratory infection: Secondary | ICD-10-CM | POA: Insufficient documentation

## 2020-01-10 DIAGNOSIS — K589 Irritable bowel syndrome without diarrhea: Secondary | ICD-10-CM | POA: Diagnosis not present

## 2020-01-10 DIAGNOSIS — R5383 Other fatigue: Secondary | ICD-10-CM | POA: Insufficient documentation

## 2020-01-10 DIAGNOSIS — R05 Cough: Secondary | ICD-10-CM | POA: Diagnosis not present

## 2020-01-10 DIAGNOSIS — E119 Type 2 diabetes mellitus without complications: Secondary | ICD-10-CM | POA: Diagnosis not present

## 2020-01-10 DIAGNOSIS — M797 Fibromyalgia: Secondary | ICD-10-CM | POA: Diagnosis not present

## 2020-01-10 DIAGNOSIS — Z7901 Long term (current) use of anticoagulants: Secondary | ICD-10-CM | POA: Diagnosis not present

## 2020-01-10 DIAGNOSIS — R109 Unspecified abdominal pain: Secondary | ICD-10-CM | POA: Diagnosis not present

## 2020-01-10 DIAGNOSIS — R0602 Shortness of breath: Secondary | ICD-10-CM | POA: Diagnosis not present

## 2020-01-10 DIAGNOSIS — E7800 Pure hypercholesterolemia, unspecified: Secondary | ICD-10-CM | POA: Insufficient documentation

## 2020-01-10 DIAGNOSIS — Z7982 Long term (current) use of aspirin: Secondary | ICD-10-CM | POA: Diagnosis not present

## 2020-01-10 DIAGNOSIS — K219 Gastro-esophageal reflux disease without esophagitis: Secondary | ICD-10-CM | POA: Diagnosis not present

## 2020-01-10 DIAGNOSIS — Z20822 Contact with and (suspected) exposure to covid-19: Secondary | ICD-10-CM | POA: Diagnosis not present

## 2020-01-10 DIAGNOSIS — R4182 Altered mental status, unspecified: Secondary | ICD-10-CM | POA: Insufficient documentation

## 2020-01-10 DIAGNOSIS — R197 Diarrhea, unspecified: Secondary | ICD-10-CM | POA: Diagnosis not present

## 2020-01-10 DIAGNOSIS — F419 Anxiety disorder, unspecified: Secondary | ICD-10-CM | POA: Diagnosis not present

## 2020-01-10 DIAGNOSIS — Z794 Long term (current) use of insulin: Secondary | ICD-10-CM | POA: Insufficient documentation

## 2020-01-10 DIAGNOSIS — R0789 Other chest pain: Secondary | ICD-10-CM

## 2020-01-10 DIAGNOSIS — M1990 Unspecified osteoarthritis, unspecified site: Secondary | ICD-10-CM | POA: Diagnosis not present

## 2020-01-10 MED ORDER — ALBUTEROL SULFATE HFA 108 (90 BASE) MCG/ACT IN AERS: 1.0000 | INHALATION_SPRAY | Freq: Four times a day (QID) | RESPIRATORY_TRACT | 0 refills | Status: DC | PRN

## 2020-01-10 MED ORDER — DM-GUAIFENESIN ER 30-600 MG PO TB12: 1.0000 | ORAL_TABLET | Freq: Two times a day (BID) | ORAL | 0 refills | Status: DC

## 2020-01-10 MED ORDER — BENZONATATE 200 MG PO CAPS: 200.0000 mg | ORAL_CAPSULE | Freq: Three times a day (TID) | ORAL | 0 refills | Status: AC | PRN

## 2020-01-10 MED ORDER — DOXYCYCLINE HYCLATE 100 MG PO CAPS: 100.0000 mg | ORAL_CAPSULE | Freq: Two times a day (BID) | ORAL | 0 refills | Status: AC

## 2020-01-10 MED ORDER — METHOCARBAMOL 750 MG PO TABS: 750.0000 mg | ORAL_TABLET | Freq: Two times a day (BID) | ORAL | 0 refills | Status: DC

## 2020-01-10 NOTE — Discharge Instructions (Signed)
Covid swab pending, monitor my chart for results If positive you will need to quarantine for full 10 days from when your symptoms started Begin doxycycline twice daily for the next 10 days Begin Mucinex DM twice daily to further help with congestion Tessalon every 8 hours for cough Albuterol inhaler as needed for chest tightness, shortness of breath or developing wheezing Rest and drink plenty of fluids, may continue lemon and tea If bowels extremely frequent may use Imodium or Pepto-Bismol  Please follow-up if continuing to not have any improvement with symptoms with the above and another 3 to 4 days or developing fevers, worsening abdominal pain, difficulty breathing

## 2020-01-10 NOTE — ED Triage Notes (Signed)
Pt presents with non productive cough, congestion, generalized body aches, chest discomfort, and diarrhea for over a week.

## 2020-01-10 NOTE — ED Provider Notes (Signed)
[name]    CSN: [id number] Arrival date & time: 01/10/20  1201      History   Chief Complaint Chief Complaint  Patient presents with  . Cough  . Generalized Body Aches  . Diarrhea  . Chest Pain    HPI Ellen Avery is a 54 y.o. female history of GERD, tobacco use, DM type II, hypertension, presenting today for evaluation of cough congestion and diarrhea.  Patient states that over the past week she has had an occasionally productive cough.  Feels as if she has a lot of congestion and mucus in her chest that she needs to bring up.  Has occasional chest discomfort and tightness.  Occasional shortness of breath, but describes this more as fatigue.  Denies wheezing.  Reports history of bronchitis.  Reports approximately 1 pack/day tobacco use.  She denies any fevers.  Has had a lot of body aches and muscle spasming in her neck.  Reports some diarrhea which has become more frequent of recently.  Denies blood in the stool.  Has intermittent abdominal pain that comes and goes with bowel movements.  Approximately 4-5 bowel movements a day.  HPI  Past Medical History:  Diagnosis Date  . Anxiety   . Arthritis   . Chronic headaches   . DDD (degenerative disc disease), cervical   . Fibromyalgia   . GERD (gastroesophageal reflux disease)   . History of colon polyps   . History of psychosis   . Hypercholesteremia   . Hypertension   . IBS (irritable bowel syndrome)   . MDD (major depressive disorder)   . Right sided facial pain   . Seasonal allergic rhinitis   . Sinus congestion    12-24-2018 per pt no fever  . Type 2 diabetes mellitus treated with insulin ([name])   . Vitamin D deficiency   . Wears dentures    full upper,  partial lower  . Wears glasses     Patient Active Problem List   Diagnosis Date Noted  . Chest pain in adult 07/20/2019  . IDDM (insulin dependent diabetes mellitus) 07/08/2018  . HTN (hypertension) 07/08/2018  . HLD (hyperlipidemia) 07/08/2018    . Altered mental status 03/02/2016  . MDD (major depressive disorder), recurrent episode, moderate ([name]) 03/02/2016  . Psychoses [facility])     Past Surgical History:  Procedure Laterality Date  . ARTERY BIOPSY Right 12/28/2018   Procedure: RIGHT BIOPSY TEMPORAL ARTERY;  Surgeon: Kieth Brightly, Arta Bruce, MD;  Location: [place];  Service: General;  Laterality: Right;  . CARDIAC CATHETERIZATION    . MULTIPLE TOOTH EXTRACTIONS  04/ 2019    sedation  . TOTAL ABDOMINAL HYSTERECTOMY W/ BILATERAL SALPINGOOPHORECTOMY  2009    OB History   No obstetric history on file.      Home Medications    Prior to Admission medications   Medication Sig Start Date End Date Taking? Authorizing Provider  ACCU-CHEK AVIVA PLUS test strip USE TO CHECK BLOOD SUGAR 4 TIMES DAILY 05/25/18   [provider]  albuterol (VENTOLIN HFA) 108 (90 Base) MCG/ACT inhaler Inhale 1-2 puffs into the lungs every 6 (six) hours as needed for wheezing or shortness of breath. 01/10/20   Brylan Seubert C, PA-C  aspirin EC 81 MG tablet Take 81 mg by mouth daily.    [provider]  benzonatate (TESSALON) 200 MG capsule Take 1 capsule (200 mg total) by mouth 3 (three) times daily as needed for up to 7 days for  cough. 01/10/20 01/17/20  Santiana Glidden C, PA-C  cetirizine (ZYRTEC) 10 MG tablet Take 10 mg by mouth daily.    [provider]  cetirizine-pseudoephedrine (ZYRTEC-D) 5-120 MG tablet Take 1 tablet by mouth daily. 10/25/19   Avegno, [name], FNP  Cholecalciferol (VITAMIN D3) 3000 units TABS Take 3,000 Units by mouth daily.    [provider]  cyclobenzaprine (FLEXERIL) 10 MG tablet Take 1 tablet (10 mg total) by mouth 2 (two) times daily as needed for muscle spasms. 08/30/18   Law, [name], PA-C  dextromethorphan-guaiFENesin (MUCINEX DM) 30-600 MG 12hr tablet Take 1 tablet by mouth 2 (two) times daily. 01/10/20   [name] C, PA-C  doxycycline (VIBRAMYCIN) 100 MG capsule  Take 1 capsule (100 mg total) by mouth 2 (two) times daily for 10 days. 01/10/20 01/20/20  [name] C, PA-C  DULoxetine (CYMBALTA) 60 MG capsule Take 60 mg by mouth daily.  12/07/18   [provider]  Famotidine (PEPCID AC PO) Take by mouth as needed.    [provider]  fluticasone (FLONASE) 50 MCG/ACT nasal spray Place 2 sprays into both nostrils daily. 09/03/19   [name], PA-C  gabapentin (NEURONTIN) 100 MG capsule Take 100-200 mg by mouth See admin instructions. Take 100 mg in the morning, 100 mg at lunch, and 200 mg in the evening 11/01/18   [provider]  hydrOXYzine (ATARAX/VISTARIL) 25 MG tablet Take 1 tablet (25 mg total) by mouth every 6 (six) hours as needed for anxiety. 09/13/18   [name], [name], MD  insulin glargine (LANTUS) 100 UNIT/ML injection Inject 50 Units into the skin at bedtime.     [provider]  JANUVIA 50 MG tablet Take 50 mg by mouth daily. 07/07/19   [provider]  losartan-hydrochlorothiazide (HYZAAR) 100-12.5 MG tablet Take 1 tablet by mouth daily.  11/01/18   [provider]  magnesium hydroxide (MILK OF MAGNESIA) 400 MG/5ML suspension Take by mouth daily as needed for mild constipation.    [provider]  metFORMIN (GLUCOPHAGE) 1000 MG tablet Take 1,000 mg by mouth daily with breakfast.     [provider]  methocarbamol (ROBAXIN) 750 MG tablet Take 1 tablet (750 mg total) by mouth 2 (two) times daily. 01/10/20   [name] C, PA-C  metoprolol tartrate (LOPRESSOR) 50 MG tablet Take 2 tablets (100 mg total) by mouth once for 1 dose. Take 2 hours prior to cardiac CTA. 07/20/19 07/20/19  Richardo Priest, MD  pseudoephedrine (SUDAFED) 60 MG tablet Take 60 mg by mouth every 4 (four) hours as needed for congestion.    [provider]  simvastatin (ZOCOR) 40 MG tablet Take 40 mg by mouth every evening. Reported on 03/01/2016    [provider]  sodium chloride (OCEAN) 0.65  % SOLN nasal spray Place 1 spray into both nostrils as needed for congestion. 08/30/18   Frederica Kuster, PA-C    Family History Family History  Problem Relation Age of Onset  . Diabetes Mother   . Heart disease Paternal Grandmother   . Breast cancer Paternal Grandmother   . Diabetes Paternal Uncle   . Heart Problems Brother   . Breast cancer Maternal Aunt   . Head & neck cancer Cousin   . Cervical cancer Paternal Aunt   . Colon cancer Neg Hx   . Colon polyps Neg Hx   . Esophageal cancer Neg Hx   . Rectal cancer Neg Hx   . Stomach cancer  Neg Hx     Social History Social History   Tobacco Use  . Smoking status: Current Every Day Smoker    Packs/day: 1.00    Years: 35.00    Pack years: 35.00    Types: Cigarettes  . Smokeless tobacco: Never Used  Substance Use Topics  . Alcohol use: No  . Drug use: No     Allergies   Flagyl [metronidazole]   Review of Systems Review of Systems  Constitutional: Positive for fatigue. Negative for activity change, appetite change, chills and fever.  HENT: Positive for congestion, rhinorrhea, sinus pressure and sore throat. Negative for ear pain and trouble swallowing.   Eyes: Negative for discharge and redness.  Respiratory: Positive for cough. Negative for chest tightness and shortness of breath.   Cardiovascular: Negative for chest pain.  Gastrointestinal: Positive for abdominal pain and diarrhea. Negative for nausea and vomiting.  Musculoskeletal: Positive for myalgias.  Skin: Negative for rash.  Neurological: Positive for headaches. Negative for dizziness and light-headedness.     Physical Exam Triage Vital Signs ED Triage Vitals  Enc Vitals Group     BP 01/10/20 1221 136/79     Pulse Rate 01/10/20 1221 88     Resp 01/10/20 1221 17     Temp 01/10/20 1221 98.7 F (37.1 C)     Temp Source 01/10/20 1221 Oral     SpO2 01/10/20 1221 93 %     Weight --      Height --      Head Circumference --      Peak Flow --      Pain  Score 01/10/20 1220 5     Pain Loc --      Pain Edu? --      Excl. in [place]? --    No data found.  Updated Vital Signs BP 136/79 (BP Location: Left Arm)   Pulse 88   Temp 98.7 F (37.1 C) (Oral)   Resp 17   SpO2 93%  O2 rechecked and was 97% Visual Acuity Right Eye Distance:   Left Eye Distance:   Bilateral Distance:    Right Eye Near:   Left Eye Near:    Bilateral Near:     Physical Exam Vitals and nursing note reviewed.  Constitutional:      General: She is not in acute distress.    Appearance: She is well-developed.  HENT:     Head: Normocephalic and atraumatic.     Ears:     Comments: Bilateral ears without tenderness to palpation of external auricle, tragus and mastoid, EAC's without erythema or swelling, TM's with good bony landmarks and cone of light. Non erythematous.     Mouth/Throat:     Comments: Oral mucosa pink and moist, no tonsillar enlargement or exudate. Posterior pharynx patent and nonerythematous, no uvula deviation or swelling. Normal phonation. Eyes:     Conjunctiva/sclera: Conjunctivae normal.  Cardiovascular:     Rate and Rhythm: Normal rate and regular rhythm.     Heart sounds: No murmur.  Pulmonary:     Effort: Pulmonary effort is normal. No respiratory distress.     Breath sounds: Normal breath sounds.     Comments: Breathing comfortably at rest, CTABL, no wheezing, rales or other adventitious sounds auscultated  Abdominal:     Palpations: Abdomen is soft.     Tenderness: There is no abdominal tenderness.     Comments: Soft, nondistended, nontender to palpation throughout all 4 quadrants of abdomen and epigastrium  Musculoskeletal:     Cervical back: Neck supple.  Skin:    General: Skin is warm and dry.  Neurological:     Mental Status: She is alert.      UC Treatments / Results  Labs (all labs ordered are listed, but only abnormal results are displayed) Labs Reviewed  NOVEL CORONAVIRUS, NAA (HOSP ORDER, SEND-OUT TO REF LAB; TAT  18-24 HRS)    EKG   Radiology No results found.  Procedures Procedures (including critical care time)  Medications Ordered in UC Medications - No data to display  Initial Impression / Assessment and Plan / UC Course  I have reviewed the triage vital signs and the nursing notes.  Pertinent labs & imaging results that were available during my care of the patient were reviewed by me and considered in my medical decision making (see chart for details).    Covid PCR pending, combination of URI symptoms with GI symptoms most likely viral etiology.  Recommending continued symptomatic and supportive care.  Given length of symptoms and history of tobacco use will also initiate on doxycycline to cover sinusitis as well as atypicals in lungs.  Deferring any steroids at this time given without wheezing and history of diabetes.  Reports sugars increasing to 500-601 on steroids.  Albuterol as needed.  Mucinex and Tessalon.  Continue to monitor,Discussed strict return precautions. Patient verbalized understanding and is agreeable with plan.   Final Clinical Impressions(s) / UC Diagnoses   Final diagnoses:  Lower respiratory infection (e.g., bronchitis, pneumonia, pneumonitis, pulmonitis)     Discharge Instructions     Covid swab pending, monitor my chart for results If positive you will need to quarantine for full 10 days from when your symptoms started Begin doxycycline twice daily for the next 10 days Begin Mucinex DM twice daily to further help with congestion Tessalon every 8 hours for cough Albuterol inhaler as needed for chest tightness, shortness of breath or developing wheezing Rest and drink plenty of fluids, may continue lemon and tea If bowels extremely frequent may use Imodium or Pepto-Bismol  Please follow-up if continuing to not have any improvement with symptoms with the above and another 3 to 4 days or developing fevers, worsening abdominal pain, difficulty  breathing     ED Prescriptions    Medication Sig Dispense Auth. Provider   doxycycline (VIBRAMYCIN) 100 MG capsule Take 1 capsule (100 mg total) by mouth 2 (two) times daily for 10 days. 20 capsule Delara Shepheard C, PA-C   albuterol (VENTOLIN HFA) 108 (90 Base) MCG/ACT inhaler Inhale 1-2 puffs into the lungs every 6 (six) hours as needed for wheezing or shortness of breath. 18 g Maleny Candy C, PA-C   benzonatate (TESSALON) 200 MG capsule Take 1 capsule (200 mg total) by mouth 3 (three) times daily as needed for up to 7 days for cough. 28 capsule Mayela Bullard C, PA-C   dextromethorphan-guaiFENesin (MUCINEX DM) 30-600 MG 12hr tablet Take 1 tablet by mouth 2 (two) times daily. 20 tablet Massimiliano Rohleder C, PA-C   methocarbamol (ROBAXIN) 750 MG tablet Take 1 tablet (750 mg total) by mouth 2 (two) times daily. 30 tablet Henrine Hayter, [place] C, PA-C     PDMP not reviewed this encounter.   Janith Lima, PA-C 01/10/20 1315

## 2020-01-12 LAB — NOVEL CORONAVIRUS, NAA (HOSP ORDER, SEND-OUT TO REF LAB; TAT 18-24 HRS): SARS-CoV-2, NAA: NOT DETECTED

## 2020-02-11 ENCOUNTER — Ambulatory Visit (HOSPITAL_COMMUNITY)
Admission: EM | Admit: 2020-02-11 | Discharge: 2020-02-11 | Disposition: A | Discharge status: Home / Self Care | Payer: Medicaid Other | Attending: Family Medicine | Admitting: Family Medicine

## 2020-02-11 ENCOUNTER — Encounter (HOSPITAL_COMMUNITY): Payer: Self-pay

## 2020-02-11 ENCOUNTER — Other Ambulatory Visit: Payer: Self-pay

## 2020-02-11 DIAGNOSIS — M542 Cervicalgia: Secondary | ICD-10-CM | POA: Insufficient documentation

## 2020-02-11 DIAGNOSIS — J069 Acute upper respiratory infection, unspecified: Secondary | ICD-10-CM

## 2020-02-11 DIAGNOSIS — Z8249 Family history of ischemic heart disease and other diseases of the circulatory system: Secondary | ICD-10-CM | POA: Diagnosis not present

## 2020-02-11 DIAGNOSIS — K219 Gastro-esophageal reflux disease without esophagitis: Secondary | ICD-10-CM | POA: Diagnosis not present

## 2020-02-11 DIAGNOSIS — E559 Vitamin D deficiency, unspecified: Secondary | ICD-10-CM | POA: Insufficient documentation

## 2020-02-11 DIAGNOSIS — R0981 Nasal congestion: Secondary | ICD-10-CM | POA: Diagnosis present

## 2020-02-11 DIAGNOSIS — Z833 Family history of diabetes mellitus: Secondary | ICD-10-CM | POA: Diagnosis not present

## 2020-02-11 DIAGNOSIS — Z794 Long term (current) use of insulin: Secondary | ICD-10-CM | POA: Insufficient documentation

## 2020-02-11 DIAGNOSIS — J0110 Acute frontal sinusitis, unspecified: Secondary | ICD-10-CM | POA: Diagnosis not present

## 2020-02-11 DIAGNOSIS — W19XXXA Unspecified fall, initial encounter: Secondary | ICD-10-CM | POA: Diagnosis not present

## 2020-02-11 DIAGNOSIS — F17210 Nicotine dependence, cigarettes, uncomplicated: Secondary | ICD-10-CM | POA: Diagnosis not present

## 2020-02-11 DIAGNOSIS — J40 Bronchitis, not specified as acute or chronic: Secondary | ICD-10-CM

## 2020-02-11 DIAGNOSIS — M797 Fibromyalgia: Secondary | ICD-10-CM | POA: Insufficient documentation

## 2020-02-11 DIAGNOSIS — S46811A Strain of other muscles, fascia and tendons at shoulder and upper arm level, right arm, initial encounter: Secondary | ICD-10-CM | POA: Insufficient documentation

## 2020-02-11 DIAGNOSIS — Z7982 Long term (current) use of aspirin: Secondary | ICD-10-CM | POA: Insufficient documentation

## 2020-02-11 DIAGNOSIS — I10 Essential (primary) hypertension: Secondary | ICD-10-CM | POA: Diagnosis not present

## 2020-02-11 DIAGNOSIS — Z881 Allergy status to other antibiotic agents status: Secondary | ICD-10-CM | POA: Insufficient documentation

## 2020-02-11 DIAGNOSIS — E785 Hyperlipidemia, unspecified: Secondary | ICD-10-CM | POA: Insufficient documentation

## 2020-02-11 DIAGNOSIS — Z20822 Contact with and (suspected) exposure to covid-19: Secondary | ICD-10-CM | POA: Diagnosis not present

## 2020-02-11 DIAGNOSIS — E119 Type 2 diabetes mellitus without complications: Secondary | ICD-10-CM | POA: Insufficient documentation

## 2020-02-11 DIAGNOSIS — F419 Anxiety disorder, unspecified: Secondary | ICD-10-CM | POA: Insufficient documentation

## 2020-02-11 DIAGNOSIS — Z79899 Other long term (current) drug therapy: Secondary | ICD-10-CM | POA: Diagnosis not present

## 2020-02-11 MED ORDER — DEXAMETHASONE SODIUM PHOSPHATE 10 MG/ML IJ SOLN
10.0000 mg | Freq: Once | INTRAMUSCULAR | Status: AC
  Administered 2020-02-11: 10 mg via INTRAMUSCULAR

## 2020-02-11 MED ORDER — AMOXICILLIN 875 MG PO TABS: 875.0000 mg | ORAL_TABLET | Freq: Two times a day (BID) | ORAL | 0 refills | Status: AC

## 2020-02-11 MED ORDER — KETOROLAC TROMETHAMINE 30 MG/ML IJ SOLN
30.0000 mg | Freq: Once | INTRAMUSCULAR | Status: AC
  Administered 2020-02-11: 14:00:00 30 mg via INTRAMUSCULAR

## 2020-02-11 MED ORDER — KETOROLAC TROMETHAMINE 30 MG/ML IJ SOLN
INTRAMUSCULAR | Status: AC
  Filled 2020-02-11: qty 1

## 2020-02-11 MED ORDER — METHOCARBAMOL 500 MG PO TABS: 500.0000 mg | ORAL_TABLET | Freq: Two times a day (BID) | ORAL | 0 refills | Status: DC

## 2020-02-11 MED ORDER — DEXAMETHASONE SODIUM PHOSPHATE 10 MG/ML IJ SOLN
INTRAMUSCULAR | Status: AC
  Filled 2020-02-11: qty 1

## 2020-02-11 MED ORDER — KETOROLAC TROMETHAMINE 30 MG/ML IJ SOLN: 30.0000 mg | Freq: Once | INTRAMUSCULAR | Status: DC

## 2020-02-11 NOTE — ED Provider Notes (Signed)
[place]    CSN: [id number] Arrival date & time: 02/11/20  1201      History   Chief Complaint Chief Complaint  Patient presents with  . Cough  . Headache  . Nasal Congestion    HPI Ellen Avery is a 54 y.o. female.   Reports that she has had nasal congestion, ear fullness, and just generally has not felt well for the last week.  Also reports neck pain from a fall that was weeks ago.  Reports that she is out of of muscle relaxer to take, but that they did help the pain.  Denies fever, shortness of breath, sore throat, chills, abdominal pain, nausea, vomiting, diarrhea, rash, other symptoms.  ROS per HPI  The history is provided by the patient.    Past Medical History:  Diagnosis Date  . Anxiety   . Arthritis   . Chronic headaches   . DDD (degenerative disc disease), cervical   . Fibromyalgia   . GERD (gastroesophageal reflux disease)   . History of colon polyps   . History of psychosis   . Hypercholesteremia   . Hypertension   . IBS (irritable bowel syndrome)   . MDD (major depressive disorder)   . Right sided facial pain   . Seasonal allergic rhinitis   . Sinus congestion    12-24-2018 per pt no fever  . Type 2 diabetes mellitus treated with insulin ([place])   . Vitamin D deficiency   . Wears dentures    full upper,  partial lower  . Wears glasses     Patient Active Problem List   Diagnosis Date Noted  . Chest pain in adult 07/20/2019  . IDDM (insulin dependent diabetes mellitus) 07/08/2018  . HTN (hypertension) 07/08/2018  . HLD (hyperlipidemia) 07/08/2018  . Altered mental status 03/02/2016  . MDD (major depressive disorder), recurrent episode, moderate ([place]) 03/02/2016  . Psychoses [facility])     Past Surgical History:  Procedure Laterality Date  . ARTERY BIOPSY Right 12/28/2018   Procedure: RIGHT BIOPSY TEMPORAL ARTERY;  Surgeon: Kieth Brightly, Arta Bruce, MD;  Location: [place];  Service: General;  Laterality: Right;  .  CARDIAC CATHETERIZATION    . MULTIPLE TOOTH EXTRACTIONS  04/ 2019    sedation  . TOTAL ABDOMINAL HYSTERECTOMY W/ BILATERAL SALPINGOOPHORECTOMY  2009    OB History   No obstetric history on file.      Home Medications    Prior to Admission medications   Medication Sig Start Date End Date Taking? Authorizing Provider  ACCU-CHEK AVIVA PLUS test strip USE TO CHECK BLOOD SUGAR 4 TIMES DAILY 05/25/18   [provider]  albuterol (VENTOLIN HFA) 108 (90 Base) MCG/ACT inhaler Inhale 1-2 puffs into the lungs every 6 (six) hours as needed for wheezing or shortness of breath. 01/10/20   Wieters, Hallie C, PA-C  amoxicillin (AMOXIL) 875 MG tablet Take 1 tablet (875 mg total) by mouth 2 (two) times daily for 10 days. 02/11/20 02/21/20  Faustino Congress, NP  aspirin EC 81 MG tablet Take 81 mg by mouth daily.    [provider]  cetirizine (ZYRTEC) 10 MG tablet Take 10 mg by mouth daily.    [provider]  cetirizine-pseudoephedrine (ZYRTEC-D) 5-120 MG tablet Take 1 tablet by mouth daily. 10/25/19   Avegno, [name], FNP  Cholecalciferol (VITAMIN D3) 3000 units TABS Take 3,000 Units by mouth daily.    [provider]  cyclobenzaprine (FLEXERIL) 10 MG tablet Take 1 tablet (  10 mg total) by mouth 2 (two) times daily as needed for muscle spasms. 08/30/18   Law, [name], PA-C  dextromethorphan-guaiFENesin (MUCINEX DM) 30-600 MG 12hr tablet Take 1 tablet by mouth 2 (two) times daily. 01/10/20   Wieters, Hallie C, PA-C  DULoxetine (CYMBALTA) 60 MG capsule Take 60 mg by mouth daily.  12/07/18   [provider]  Famotidine (PEPCID AC PO) Take by mouth as needed.    [provider]  fluticasone (FLONASE) 50 MCG/ACT nasal spray Place 2 sprays into both nostrils daily. 09/03/19   [name], PA-C  gabapentin (NEURONTIN) 100 MG capsule Take 100-200 mg by mouth See admin instructions. Take 100 mg in the morning, 100 mg at lunch, and 200 mg in the evening 11/01/18    [provider]  hydrOXYzine (ATARAX/VISTARIL) 25 MG tablet Take 1 tablet (25 mg total) by mouth every 6 (six) hours as needed for anxiety. 09/13/18   [name], [name], MD  insulin glargine (LANTUS) 100 UNIT/ML injection Inject 50 Units into the skin at bedtime.     [provider]  JANUVIA 50 MG tablet Take 50 mg by mouth daily. 07/07/19   [provider]  LANTUS SOLOSTAR 100 UNIT/ML Solostar Pen SMARTSIG:55 Unit(s) SUB-Q Every Night 12/12/19   [provider]  losartan-hydrochlorothiazide (HYZAAR) 100-12.5 MG tablet Take 1 tablet by mouth daily.  11/01/18   [provider]  magnesium hydroxide (MILK OF MAGNESIA) 400 MG/5ML suspension Take by mouth daily as needed for mild constipation.    [provider]  metFORMIN (GLUCOPHAGE) 1000 MG tablet Take 1,000 mg by mouth daily with breakfast.     [provider]  methocarbamol (ROBAXIN) 500 MG tablet Take 1 tablet (500 mg total) by mouth 2 (two) times daily. 02/11/20   Faustino Congress, NP  metoprolol tartrate (LOPRESSOR) 50 MG tablet Take 2 tablets (100 mg total) by mouth once for 1 dose. Take 2 hours prior to cardiac CTA. 07/20/19 07/20/19  Richardo Priest, MD  pseudoephedrine (SUDAFED) 60 MG tablet Take 60 mg by mouth every 4 (four) hours as needed for congestion.    [provider]  rosuvastatin (CRESTOR) 10 MG tablet Take 10 mg by mouth at bedtime. 12/12/19   [provider]  simvastatin (ZOCOR) 40 MG tablet Take 40 mg by mouth every evening. Reported on 03/01/2016    [provider]  sodium chloride (OCEAN) 0.65 % SOLN nasal spray Place 1 spray into both nostrils as needed for congestion. 08/30/18   Frederica Kuster, PA-C    Family History Family History  Problem Relation Age of Onset  . Diabetes Mother   . Heart disease Paternal Grandmother   . Breast cancer Paternal Grandmother   . Diabetes Paternal Uncle   . Heart Problems Brother   . Breast cancer  Maternal Aunt   . Head & neck cancer Cousin   . Cervical cancer Paternal Aunt   . Colon cancer Neg Hx   . Colon polyps Neg Hx   . Esophageal cancer Neg Hx   . Rectal cancer Neg Hx   . Stomach cancer Neg Hx     Social History Social History   Tobacco Use  . Smoking status: Current Every Day Smoker    Packs/day: 1.00    Years: 35.00    Pack years: 35.00    Types: Cigarettes  . Smokeless tobacco: Never Used  Substance Use Topics  . Alcohol use: No  . Drug use: No  Allergies   Doxycycline and Flagyl [metronidazole]   Review of Systems Review of Systems   Physical Exam Triage Vital Signs ED Triage Vitals  Enc Vitals Group     BP 02/11/20 1251 124/85     Pulse Rate 02/11/20 1251 91     Resp 02/11/20 1251 18     Temp 02/11/20 1251 98.5 F (36.9 C)     Temp Source 02/11/20 1251 Oral     SpO2 02/11/20 1251 98 %     Weight --      Height --      Head Circumference --      Peak Flow --      Pain Score 02/11/20 1245 7     Pain Loc --      Pain Edu? --      Excl. in [place]? --    No data found.  Updated Vital Signs BP 124/85 (BP Location: Right Arm)   Pulse 91   Temp 98.5 F (36.9 C) (Oral)   Resp 18   SpO2 98%       Physical Exam Vitals and nursing note reviewed.  Constitutional:      General: She is not in acute distress.    Appearance: She is well-developed. She is obese. She is ill-appearing.  HENT:     Head: Normocephalic and atraumatic.     Right Ear: Tympanic membrane normal.     Left Ear: Tympanic membrane normal.     Nose: Congestion present.     Mouth/Throat:     Mouth: Mucous membranes are moist.     Pharynx: No oropharyngeal exudate or posterior oropharyngeal erythema.  Eyes:     Conjunctiva/sclera: Conjunctivae normal.  Cardiovascular:     Rate and Rhythm: Normal rate and regular rhythm.     Heart sounds: Normal heart sounds. No murmur.  Pulmonary:     Effort: Pulmonary effort is normal. No respiratory distress.     Breath sounds:  Normal breath sounds. No stridor. No wheezing, rhonchi or rales.  Abdominal:     General: Bowel sounds are normal. There is no distension.     Palpations: Abdomen is soft. There is no mass.     Tenderness: There is no abdominal tenderness. There is no guarding or rebound.     Hernia: No hernia is present.  Musculoskeletal:        General: Normal range of motion.     Cervical back: Normal range of motion and neck supple. Tenderness present.  Skin:    General: Skin is warm and dry.     Capillary Refill: Capillary refill takes less than 2 seconds.  Neurological:     General: No focal deficit present.     Mental Status: She is alert and oriented to person, place, and time.  Psychiatric:        Mood and Affect: Mood normal.        Behavior: Behavior normal.      UC Treatments / Results  Labs (all labs ordered are listed, but only abnormal results are displayed) Labs Reviewed  NOVEL CORONAVIRUS, NAA (HOSP ORDER, SEND-OUT TO REF LAB; TAT 18-24 HRS)    EKG   Radiology No results found.  Procedures Procedures (including critical care time)  Medications Ordered in UC Medications  dexamethasone (DECADRON) injection 10 mg (10 mg Intramuscular Given 02/11/20 1333)  ketorolac (TORADOL) 30 MG/ML injection 30 mg (30 mg Intramuscular Given 02/11/20 1340)    Initial Impression / Assessment and Plan /  UC Course  I have reviewed the triage vital signs and the nursing notes.  Pertinent labs & imaging results that were available during my care of the patient were reviewed by me and considered in my medical decision making (see chart for details).     Right trapezius strain.  Refilled Robaxin today to take as needed for muscle spasms.  Follow-up with primary care physician for this.  Acute sinusitis with bronchitis at this visit.  Amoxicillin 875 twice daily x10 days prescribed.  Decadron 10 mg IM given in office today Toradol 30 mg IM given in office today as well.  Instructed to follow-up  with primary care or this office if not improving over the next few day.  Instructed to self quarantine until Covid results are back and negative Covid test pending will inform patient of results instructed to go to the ER with shortness of breath, high fever, severe diarrhea, other concerning symptoms. Final Clinical Impressions(s) / UC Diagnoses   Final diagnoses:  Bronchitis  Acute upper respiratory infection  Acute frontal sinusitis, recurrence not specified  Strain of right trapezius muscle, initial encounter  Neck pain     Discharge Instructions     Your COVID test is pending.  You should self quarantine until your test result is back and is negative.    Take Tylenol as needed for fever or discomfort.  Rest and keep yourself hydrated.    Go to the emergency department if you develop high fever, shortness of breath, severe diarrhea, or other concerning symptoms.       ED Prescriptions    Medication Sig Dispense Auth. Provider   methocarbamol (ROBAXIN) 500 MG tablet Take 1 tablet (500 mg total) by mouth 2 (two) times daily. 20 tablet Faustino Congress, NP   amoxicillin (AMOXIL) 875 MG tablet Take 1 tablet (875 mg total) by mouth 2 (two) times daily for 10 days. 20 tablet Faustino Congress, NP     PDMP not reviewed this encounter.   Faustino Congress, NP 02/11/20 1606

## 2020-02-11 NOTE — Discharge Instructions (Signed)
Your COVID test is pending.  You should self quarantine until your test result is back and is negative.    Take Tylenol as needed for fever or discomfort.  Rest and keep yourself hydrated.    Go to the emergency department if you develop high fever, shortness of breath, severe diarrhea, or other concerning symptoms.    

## 2020-02-11 NOTE — ED Triage Notes (Signed)
Pt c/o nasal congestion, cough, HA, ear fullness, general malaise for approx 1 week. Denies fever, sore throat, chills, abd pain, n/v/d or loss of taste or smell.  Also c/o pain to neck/shoulder muscles for a couple of weeks; no improvement with ibuprofen, tylenol. Takes hot bath and muscle relaxants with some relief.  Reports h/o seasonal allergies.

## 2020-02-12 LAB — NOVEL CORONAVIRUS, NAA (HOSP ORDER, SEND-OUT TO REF LAB; TAT 18-24 HRS): SARS-CoV-2, NAA: NOT DETECTED

## 2020-04-03 ENCOUNTER — Other Ambulatory Visit: Payer: Self-pay

## 2020-04-03 ENCOUNTER — Ambulatory Visit (INDEPENDENT_AMBULATORY_CARE_PROVIDER_SITE_OTHER)
Admission: RE | Admit: 2020-04-03 | Discharge: 2020-04-03 | Disposition: A | Discharge status: Home / Self Care | Payer: Medicaid Other | Source: Ambulatory Visit

## 2020-04-03 ENCOUNTER — Ambulatory Visit
Admission: RE | Admit: 2020-04-03 | Discharge: 2020-04-03 | Discharge status: Absent without leave | Payer: Medicaid Other | Source: Ambulatory Visit

## 2020-04-03 DIAGNOSIS — Z760 Encounter for issue of repeat prescription: Secondary | ICD-10-CM | POA: Diagnosis not present

## 2020-04-03 DIAGNOSIS — J302 Other seasonal allergic rhinitis: Secondary | ICD-10-CM

## 2020-04-03 MED ORDER — DULOXETINE HCL 60 MG PO CPEP: 60.0000 mg | ORAL_CAPSULE | Freq: Every day | ORAL | 0 refills | Status: DC

## 2020-04-03 MED ORDER — FLUTICASONE PROPIONATE 50 MCG/ACT NA SUSP: 1.0000 | Freq: Every day | NASAL | 0 refills | Status: DC

## 2020-04-03 MED ORDER — GABAPENTIN 100 MG PO CAPS: 100.0000 mg | ORAL_CAPSULE | ORAL | 0 refills | Status: DC

## 2020-04-03 NOTE — ED Provider Notes (Signed)
Virtual Visit via Video Note:  Ellen Avery  initiated request for Telemedicine visit with [facility] team. I connected with Ellen Avery  on 04/03/2020 at 2:38 PM  for a synchronized telemedicine visit using a video enabled HIPPA compliant telemedicine application. I verified that I am speaking with Ellen Avery  using two identifiers. Sharion Balloon, NP  was physically located in a [facility] Urgent care site and Ellen Avery was located at a different location.   The limitations of evaluation and management by telemedicine as well as the availability of in-person appointments were discussed. Patient was informed that she  may incur a bill ( including co-pay) for this virtual visit encounter. Ellen Avery  expressed understanding and gave verbal consent to proceed with virtual visit.     History of Present Illness:Ellen Avery  is a 54 y.o. female presents for evaluation of sinus congestion x 3 weeks; She attributes this to allergies and states Flonase usually works for this.  She denies fever, chills, sore throat, cough, shortness of breath, vomiting, diarrhea, rash, or other symptoms.   She also requests a refill on her gabapentin and duloxetine. She states she has to see her PCP before she can get refills and has an appointment with her PCP on 04/09/2020.    Allergies  Allergen Reactions  . Doxycycline Hives and Itching  . Flagyl [Metronidazole] Hives and Itching    Benadryl      Past Medical History:  Diagnosis Date  . Anxiety   . Arthritis   . Chronic headaches   . DDD (degenerative disc disease), cervical   . Fibromyalgia   . GERD (gastroesophageal reflux disease)   . History of colon polyps   . History of psychosis   . Hypercholesteremia   . Hypertension   . IBS (irritable bowel syndrome)   . MDD (major depressive disorder)   . Right sided facial pain   . Seasonal allergic rhinitis   . Sinus congestion    12-24-2018 per pt no fever  . Type 2 diabetes  mellitus treated with insulin ([place])   . Vitamin D deficiency   . Wears dentures    full upper,  partial lower  . Wears glasses      Social History   Tobacco Use  . Smoking status: Current Every Day Smoker    Packs/day: 1.00    Years: 35.00    Pack years: 35.00    Types: Cigarettes  . Smokeless tobacco: Never Used  Substance Use Topics  . Alcohol use: No  . Drug use: No   ROS: as stated in HPI.  All other systems reviewed and negative.      Observations/Objective: Physical Exam  VITALS: Patient denies fever. GENERAL: Alert, appears well and in no acute distress. HEENT: Atraumatic. NECK: Normal movements of the head and neck. CARDIOPULMONARY: No increased WOB. Speaking in clear sentences. I:E ratio WNL.  MS: Moves all visible extremities without noticeable abnormality. PSYCH: Pleasant and cooperative, well-groomed. Speech normal rate and rhythm. Affect is appropriate. Insight and judgement are appropriate. Attention is focused, linear, and appropriate.  NEURO: CN grossly intact. Oriented as arrived to appointment on time with no prompting. Moves both UE equally.  SKIN: No obvious lesions, wounds, erythema, or cyanosis noted on face or hands.   Assessment and Plan:    ICD-10-CM   1. Seasonal allergies  J30.2   2. Encounter for medication refill  Z76.0  Follow Up Instructions: Treating with Flonase.  2-week supply of gabapentin and Cymbalta prescribed.  Instructed patient to follow-up with her PCP as scheduled on 04/09/2020.  Patient agrees to plan of care.      I discussed the assessment and treatment plan with the patient. The patient was provided an opportunity to ask questions and all were answered. The patient agreed with the plan and demonstrated an understanding of the instructions.   The patient was advised to call back or seek an in-person evaluation if the symptoms worsen or if the condition fails to improve as anticipated.      Sharion Balloon, NP   04/03/2020 2:38 PM         Sharion Balloon, NP 04/03/20 1438

## 2020-04-11 ENCOUNTER — Emergency Department (HOSPITAL_COMMUNITY)
Admission: EM | Admit: 2020-04-11 | Discharge: 2020-04-11 | Disposition: A | Discharge status: Home / Self Care | Payer: Medicaid Other | Attending: Emergency Medicine | Admitting: Emergency Medicine

## 2020-04-11 ENCOUNTER — Telehealth: Payer: Medicaid Other

## 2020-04-11 ENCOUNTER — Emergency Department (HOSPITAL_COMMUNITY): Payer: Medicaid Other

## 2020-04-11 ENCOUNTER — Encounter (HOSPITAL_COMMUNITY): Payer: Self-pay | Admitting: Radiology

## 2020-04-11 ENCOUNTER — Other Ambulatory Visit: Payer: Self-pay

## 2020-04-11 DIAGNOSIS — R52 Pain, unspecified: Secondary | ICD-10-CM | POA: Diagnosis not present

## 2020-04-11 DIAGNOSIS — M797 Fibromyalgia: Secondary | ICD-10-CM | POA: Diagnosis not present

## 2020-04-11 DIAGNOSIS — I10 Essential (primary) hypertension: Secondary | ICD-10-CM | POA: Insufficient documentation

## 2020-04-11 DIAGNOSIS — F17210 Nicotine dependence, cigarettes, uncomplicated: Secondary | ICD-10-CM | POA: Insufficient documentation

## 2020-04-11 DIAGNOSIS — Z79899 Other long term (current) drug therapy: Secondary | ICD-10-CM | POA: Insufficient documentation

## 2020-04-11 DIAGNOSIS — Z794 Long term (current) use of insulin: Secondary | ICD-10-CM | POA: Insufficient documentation

## 2020-04-11 DIAGNOSIS — G4489 Other headache syndrome: Secondary | ICD-10-CM | POA: Diagnosis not present

## 2020-04-11 DIAGNOSIS — E119 Type 2 diabetes mellitus without complications: Secondary | ICD-10-CM | POA: Insufficient documentation

## 2020-04-11 DIAGNOSIS — R519 Headache, unspecified: Secondary | ICD-10-CM | POA: Diagnosis not present

## 2020-04-11 LAB — URINALYSIS, ROUTINE W REFLEX MICROSCOPIC
Bilirubin Urine: NEGATIVE
Glucose, UA: >=500 mg/dL — AB
Hgb urine dipstick: NEGATIVE
Ketones, ur: 5 mg/dL — AB
Leukocytes,Ua: NEGATIVE
Nitrite: NEGATIVE
Protein, ur: 30 mg/dL — AB
Specific Gravity, Urine: 1.036 — ABNORMAL HIGH (ref 1.005–1.030)
pH: 5 (ref 5.0–8.0)

## 2020-04-11 LAB — BASIC METABOLIC PANEL
Anion gap: 13 (ref 5–15)
BUN: 13 mg/dL (ref 6–20)
CO2: 23 mmol/L (ref 22–32)
Calcium: 9.5 mg/dL (ref 8.9–10.3)
Chloride: 103 mmol/L (ref 98–111)
Creatinine, Ser: 0.95 mg/dL (ref 0.44–1.00)
GFR calc Af Amer: >60 mL/min (ref >60)
GFR calc non Af Amer: >60 mL/min (ref >60)
Glucose, Bld: 182 mg/dL — ABNORMAL HIGH (ref 70–99)
Potassium: 3.9 mmol/L (ref 3.5–5.1)
Sodium: 139 mmol/L (ref 135–145)

## 2020-04-11 LAB — CBC
HCT: 40 % (ref 36.0–46.0)
Hemoglobin: 12.9 g/dL (ref 12.0–15.0)
MCH: 27.6 pg (ref 26.0–34.0)
MCHC: 32.3 g/dL (ref 30.0–36.0)
MCV: 85.7 fL (ref 80.0–100.0)
Platelets: 346 10*3/uL (ref 150–400)
RBC: 4.67 MIL/uL (ref 3.87–5.11)
RDW: 12.2 % (ref 11.5–15.5)
WBC: 9.1 10*3/uL (ref 4.0–10.5)
nRBC: 0 % (ref 0.0–0.2)

## 2020-04-11 MED ORDER — METOCLOPRAMIDE HCL 5 MG/ML IJ SOLN
10.0000 mg | Freq: Once | INTRAMUSCULAR | Status: AC
  Administered 2020-04-11: 10 mg via INTRAVENOUS
  Filled 2020-04-11: qty 2

## 2020-04-11 MED ORDER — DIPHENHYDRAMINE HCL 50 MG/ML IJ SOLN
25.0000 mg | Freq: Once | INTRAMUSCULAR | Status: AC
  Administered 2020-04-11: 25 mg via INTRAVENOUS
  Filled 2020-04-11: qty 1

## 2020-04-11 MED ORDER — SODIUM CHLORIDE 0.9% FLUSH
3.0000 mL | Freq: Once | INTRAVENOUS | Status: AC
  Administered 2020-04-11: 3 mL via INTRAVENOUS

## 2020-04-11 MED ORDER — SODIUM CHLORIDE 0.9 % IV BOLUS
500.0000 mL | Freq: Once | INTRAVENOUS | Status: AC
  Administered 2020-04-11: 500 mL via INTRAVENOUS

## 2020-04-11 NOTE — Discharge Instructions (Addendum)
You have been diagnosed today with Headache.  At this time there does not appear to be the presence of an emergent medical condition, however there is always the potential for conditions to change. Please read and follow the below instructions.  Please return to the Emergency Department immediately for any new or worsening symptoms. Please be sure to follow up with your Primary Care Provider within one week regarding your visit today; please call their office to schedule an appointment even if you are feeling better for a follow-up visit. Drink enough water to avoid dehydration and get enough sleep.  Get help right away if: Your headache gets very bad quickly. Your headache gets worse after a lot of physical activity. You keep throwing up. You have a stiff neck. You have trouble seeing. You have trouble speaking. You have pain in the eye or ear. Your muscles are weak or you lose muscle control. You lose your balance or have trouble walking. You feel like you will pass out (faint) or you pass out. You are mixed up (confused). You have a seizure. You have any new/concerning or worsening of symptoms  Please read the additional information packets attached to your discharge summary.  Do not take your medicine if  develop an itchy rash, swelling in your mouth or lips, or difficulty breathing; call 911 and seek immediate emergency medical attention if this occurs.  Note: Portions of this text may have been transcribed using voice recognition software. Every effort was made to ensure accuracy; however, inadvertent computerized transcription errors may still be present.

## 2020-04-11 NOTE — ED Notes (Signed)
Patient verbalizes understanding of discharge instructions. Opportunity for questioning and answers were provided. Armband removed by staff, pt discharged from ED and ambulated to lobby with spouse to return home.

## 2020-04-11 NOTE — ED Provider Notes (Signed)
[place] EMERGENCY DEPARTMENT Provider Note   CSN: [id number] Arrival date & time: 04/11/20  1608     History No chief complaint on file.   Ellen Avery is a 54 y.o. female history GERD, fibromyalgia, psychosis, hypertension, IBS, headaches, type 2 diabetes, hypertension, hyperlipidemia.  Patient presents for right-sided headache gradual onset yesterday afternoon right-sided aching intermittent no clear aggravating or alleviating factors worsened with light currently moderate intensity, she attempted Tylenol and ibuprofen earlier today with moderate relief.  Patient reports history of similar headaches in the past, of note patient reports that she had a temporal artery biopsy performed last year which was negative for temporal arteritis.  Denies fever/chills, vision changes, dizziness, sore throat, neck pain, chest pain/shortness of breath, abdominal pain, nausea/vomiting, numbness/weakness, fall/injury or any additional concerns.  HPI     Past Medical History:  Diagnosis Date  . Anxiety   . Arthritis   . Chronic headaches   . DDD (degenerative disc disease), cervical   . Fibromyalgia   . GERD (gastroesophageal reflux disease)   . History of colon polyps   . History of psychosis   . Hypercholesteremia   . Hypertension   . IBS (irritable bowel syndrome)   . MDD (major depressive disorder)   . Right sided facial pain   . Seasonal allergic rhinitis   . Sinus congestion    12-24-2018 per pt no fever  . Type 2 diabetes mellitus treated with insulin ([place])   . Vitamin D deficiency   . Wears dentures    full upper,  partial lower  . Wears glasses     Patient Active Problem List   Diagnosis Date Noted  . Chest pain in adult 07/20/2019  . IDDM (insulin dependent diabetes mellitus) 07/08/2018  . HTN (hypertension) 07/08/2018  . HLD (hyperlipidemia) 07/08/2018  . Altered mental status 03/02/2016  . MDD (major depressive disorder), recurrent episode,  moderate ([place]) 03/02/2016  . Psychoses [facility])     Past Surgical History:  Procedure Laterality Date  . ARTERY BIOPSY Right 12/28/2018   Procedure: RIGHT BIOPSY TEMPORAL ARTERY;  Surgeon: Kieth Brightly, Arta Bruce, MD;  Location: [place];  Service: General;  Laterality: Right;  . CARDIAC CATHETERIZATION    . MULTIPLE TOOTH EXTRACTIONS  04/ 2019    sedation  . TOTAL ABDOMINAL HYSTERECTOMY W/ BILATERAL SALPINGOOPHORECTOMY  2009     OB History   No obstetric history on file.     Family History  Problem Relation Age of Onset  . Diabetes Mother   . Heart disease Paternal Grandmother   . Breast cancer Paternal Grandmother   . Diabetes Paternal Uncle   . Heart Problems Brother   . Breast cancer Maternal Aunt   . Head & neck cancer Cousin   . Cervical cancer Paternal Aunt   . Colon cancer Neg Hx   . Colon polyps Neg Hx   . Esophageal cancer Neg Hx   . Rectal cancer Neg Hx   . Stomach cancer Neg Hx     Social History   Tobacco Use  . Smoking status: Current Every Day Smoker    Packs/day: 1.00    Years: 35.00    Pack years: 35.00    Types: Cigarettes  . Smokeless tobacco: Never Used  Substance Use Topics  . Alcohol use: No  . Drug use: No    Home Medications Prior to Admission medications   Medication Sig Start Date End Date Taking? Authorizing Provider  ACCU-CHEK AVIVA  PLUS test strip USE TO CHECK BLOOD SUGAR 4 TIMES DAILY 05/25/18   [provider]  albuterol (VENTOLIN HFA) 108 (90 Base) MCG/ACT inhaler Inhale 1-2 puffs into the lungs every 6 (six) hours as needed for wheezing or shortness of breath. 01/10/20   Wieters, Hallie C, PA-C  aspirin EC 81 MG tablet Take 81 mg by mouth daily.    [provider]  cetirizine (ZYRTEC) 10 MG tablet Take 10 mg by mouth daily.    [provider]  cetirizine-pseudoephedrine (ZYRTEC-D) 5-120 MG tablet Take 1 tablet by mouth daily. 10/25/19   Avegno, [name], FNP  Cholecalciferol (VITAMIN D3)  3000 units TABS Take 3,000 Units by mouth daily.    [provider]  cyclobenzaprine (FLEXERIL) 10 MG tablet Take 1 tablet (10 mg total) by mouth 2 (two) times daily as needed for muscle spasms. 08/30/18   Law, [name], PA-C  dextromethorphan-guaiFENesin (MUCINEX DM) 30-600 MG 12hr tablet Take 1 tablet by mouth 2 (two) times daily. 01/10/20   Wieters, Hallie C, PA-C  DULoxetine (CYMBALTA) 60 MG capsule Take 1 capsule (60 mg total) by mouth daily. 04/03/20   Sharion Balloon, NP  Famotidine (PEPCID AC PO) Take by mouth as needed.    [provider]  fluticasone (FLONASE) 50 MCG/ACT nasal spray Place 1 spray into both nostrils daily. 04/03/20   Sharion Balloon, NP  gabapentin (NEURONTIN) 100 MG capsule Take 1-2 capsules (100-200 mg total) by mouth See admin instructions. Take 100 mg in the morning, 100 mg at lunch, and 200 mg in the evening 04/03/20   Sharion Balloon, NP  hydrOXYzine (ATARAX/VISTARIL) 25 MG tablet Take 1 tablet (25 mg total) by mouth every 6 (six) hours as needed for anxiety. 09/13/18   [name], [name], MD  insulin glargine (LANTUS) 100 UNIT/ML injection Inject 50 Units into the skin at bedtime.     [provider]  JANUVIA 50 MG tablet Take 50 mg by mouth daily. 07/07/19   [provider]  LANTUS SOLOSTAR 100 UNIT/ML Solostar Pen SMARTSIG:55 Unit(s) SUB-Q Every Night 12/12/19   [provider]  losartan-hydrochlorothiazide (HYZAAR) 100-12.5 MG tablet Take 1 tablet by mouth daily.  11/01/18   [provider]  magnesium hydroxide (MILK OF MAGNESIA) 400 MG/5ML suspension Take by mouth daily as needed for mild constipation.    [provider]  metFORMIN (GLUCOPHAGE) 1000 MG tablet Take 1,000 mg by mouth daily with breakfast.     [provider]  methocarbamol (ROBAXIN) 500 MG tablet Take 1 tablet (500 mg total) by mouth 2 (two) times daily. 02/11/20   Faustino Congress, NP  metoprolol tartrate (LOPRESSOR) 50 MG tablet Take 2  tablets (100 mg total) by mouth once for 1 dose. Take 2 hours prior to cardiac CTA. 07/20/19 07/20/19  Richardo Priest, MD  pseudoephedrine (SUDAFED) 60 MG tablet Take 60 mg by mouth every 4 (four) hours as needed for congestion.    [provider]  rosuvastatin (CRESTOR) 10 MG tablet Take 10 mg by mouth at bedtime. 12/12/19   [provider]  simvastatin (ZOCOR) 40 MG tablet Take 40 mg by mouth every evening. Reported on 03/01/2016    [provider]  sodium chloride (OCEAN) 0.65 % SOLN nasal spray Place 1 spray into both nostrils as needed for congestion. 08/30/18   Frederica Kuster, PA-C    Allergies    Doxycycline and Flagyl [metronidazole]  Review of Systems   Review of Systems Ten  systems are reviewed and are negative for acute change except as noted in the HPI  Physical Exam Updated Vital Signs BP 127/86 (BP Location: Right Arm)   Pulse 94   Temp 98.6 F (37 C) (Oral)   Resp 18   SpO2 95%   Physical Exam Constitutional:      General: She is not in acute distress.    Appearance: Normal appearance. She is well-developed. She is not ill-appearing or diaphoretic.  HENT:     Head: Normocephalic and atraumatic.     Jaw: There is normal jaw occlusion. No trismus.     Comments: No pain overlying the temporal, cervical or facial arteries.    Right Ear: External ear normal.     Left Ear: External ear normal.     Nose: Nose normal.     Mouth/Throat:     Mouth: Mucous membranes are moist.     Pharynx: Oropharynx is clear.  Eyes:     General: Vision grossly intact. Gaze aligned appropriately.     Extraocular Movements: Extraocular movements intact.     Conjunctiva/sclera: Conjunctivae normal.     Pupils: Pupils are equal, round, and reactive to light.     Comments: Visual fields grossly intact bilaterally No pain with extraocular motion  Neck:     Trachea: Trachea and phonation normal. No tracheal tenderness or tracheal deviation.     Meningeal:  Brudzinski's sign absent.  Pulmonary:     Effort: Pulmonary effort is normal. No respiratory distress.  Abdominal:     General: There is no distension.     Palpations: Abdomen is soft.     Tenderness: There is no abdominal tenderness. There is no guarding or rebound.  Musculoskeletal:        General: Normal range of motion.     Cervical back: Full passive range of motion without pain, normal range of motion and neck supple.  Skin:    General: Skin is warm and dry.  Neurological:     Mental Status: She is alert.     GCS: GCS eye subscore is 4. GCS verbal subscore is 5. GCS motor subscore is 6.     Comments: Speech is clear and goal oriented, follows commands Major Cranial nerves without deficit, no facial droop Normal strength in upper and lower extremities bilaterally including dorsiflexion and plantar flexion, strong and equal grip strength Sensation normal to light and sharp touch Moves extremities without ataxia, coordination intact Normal finger to nose and rapid alternating movements Neg romberg, no pronator drift Normal gait Normal heel-shin and balance  Psychiatric:        Behavior: Behavior normal.     ED Results / Procedures / Treatments   Labs (all labs ordered are listed, but only abnormal results are displayed) Labs Reviewed  BASIC METABOLIC PANEL - Abnormal; Notable for the following components:      Result Value   Glucose, Bld 182 (*)    All other components within normal limits  URINALYSIS, ROUTINE W REFLEX MICROSCOPIC - Abnormal; Notable for the following components:   APPearance HAZY (*)    Specific Gravity, Urine 1.036 (*)    Glucose, UA >=500 (*)    Ketones, ur 5 (*)    Protein, ur 30 (*)    Bacteria, UA RARE (*)    All other components within normal limits  CBC    EKG None  Radiology CT Head Wo Contrast  Result Date: 04/11/2020 CLINICAL DATA:  54 year old female with headache. EXAM: CT  HEAD WITHOUT CONTRAST TECHNIQUE: Contiguous axial images  were obtained from the base of the skull through the vertex without intravenous contrast. COMPARISON:  None. FINDINGS: Brain: The ventricles and sulci appropriate size for patient's age. The gray-white matter discrimination is preserved. There is no acute intracranial hemorrhage. No mass effect or midline shift. No extra-axial fluid collection. There is a partially empty sella. Vascular: No hyperdense vessel or unexpected calcification. Skull: Normal. Negative for fracture or focal lesion. Sinuses/Orbits: No acute finding. Other: None IMPRESSION: Unremarkable noncontrast CT of the brain. Electronically Signed   By: Anner Crete M.D.   On: 04/11/2020 20:39    Procedures Procedures (including critical care time)  Medications Ordered in ED Medications  sodium chloride flush (NS) 0.9 % injection 3 mL (3 mLs Intravenous Given 04/11/20 1921)  metoCLOPramide (REGLAN) injection 10 mg (10 mg Intravenous Given 04/11/20 1921)  diphenhydrAMINE (BENADRYL) injection 25 mg (25 mg Intravenous Given 04/11/20 1921)  sodium chloride 0.9 % bolus 500 mL (500 mLs Intravenous New Bag/Given 04/11/20 1935)    ED Course  I have reviewed the triage vital signs and the nursing notes.  Pertinent labs & imaging results that were available during my care of the patient were reviewed by me and considered in my medical decision making (see chart for details).    MDM Rules/Calculators/A&P                     Chart review, 12/28/2018, surgical pathology shows patient had right temporal artery biopsy which showed a muscular vessel with no significant inflammation.  Dr. Vicente Males. -------------- [name] is a 54 y.o. female who presents to ED for right-sided headache which she reports is consistent with her typical headache.  Physical examination reassuring without neurologic deficit or meningeal signs, no evidence of trauma.  She has no red flag symptoms such as fever/recent illness, sudden onset.  Additionally she has no  tenderness of the temporal facial or cervical arteries suggesting of arteritis or dissection.  As above she did have a temporal arteritis work-up without was negative last year doubt this to be etiology of her symptoms today.  We will treat patient's headache with IV fluids, Reglan and Benadryl and reassess.  Labs including CBC, BMP and urinalysis were ordered in triage.  I have reviewed and interpreted the following labs.  CBC within normal limits no excessive to suggest infection no evidence of anemia.  BMP shows elevation of glucose of 182 consistent with diabetes, no emergent electrolyte derangement or evidence of kidney injury.  Urinalysis shows glucose, ketones protein, rare bacteria and 6-7 squamous cells.  Urinalysis appears contaminated today doubt UTI as patient asymptomatic.  Suspect ketones secondary to dehydration there is no evidence of DKA at this time.  In chart review cannot find previous CT imaging of patient's head, will order CT head for evaluation of headache. --------------------- Informed by RN the patient's headache is completely resolved and she is requesting discharge.  On to go reevaluate patient however she is being taken to CT. - CT Head: IMPRESSION:  Unremarkable noncontrast CT of the brain.  I personally reviewed patient's CT scan and agree with radiologist interpretation.  No obvious hemorrhage or mass-effect. - 8:43 PM: Patient reevaluated resting comfortably in bed no acute distress.  Reports that she is feeling well and would like to be discharged.  She has no complaints or concerns at this time.  No evidence of meningitis, temporal arteritis, venous sinus thrombosis, CVA, SAH, dissection or other  emergent pathologies at this time.  At this time there does not appear to be any evidence of an acute emergency medical condition and the patient appears stable for discharge with appropriate outpatient follow up. Diagnosis was discussed with patient who verbalizes  understanding of care plan and is agreeable to discharge. I have discussed return precautions with patient and friend who verbalizes understanding. Patient encouraged to follow-up with their PCP. All questions answered.  Patient's case discussed with Dr. Stark Jock who agrees with plan to discharge with follow-up.   Note: Portions of this report may have been transcribed using voice recognition software. Every effort was made to ensure accuracy; however, inadvertent computerized transcription errors may still be present.  Final Clinical Impression(s) / ED Diagnoses Final diagnoses:  Nonintractable headache, unspecified chronicity pattern, unspecified headache type    Rx / DC Orders ED Discharge Orders    None       Gari Crown 04/11/20 2046    Veryl Speak, MD 04/12/20 1627

## 2020-04-11 NOTE — ED Triage Notes (Signed)
Pt bib ems with R sided headache. Pt has tried OTC meds without relief.  127/86 HR 88 97% RA 97.59F

## 2020-04-16 ENCOUNTER — Ambulatory Visit (INDEPENDENT_AMBULATORY_CARE_PROVIDER_SITE_OTHER)
Admission: RE | Admit: 2020-04-16 | Discharge: 2020-04-16 | Disposition: A | Discharge status: Home / Self Care | Payer: Medicaid Other | Source: Ambulatory Visit

## 2020-04-16 DIAGNOSIS — E1140 Type 2 diabetes mellitus with diabetic neuropathy, unspecified: Secondary | ICD-10-CM | POA: Diagnosis not present

## 2020-04-16 DIAGNOSIS — G8929 Other chronic pain: Secondary | ICD-10-CM

## 2020-04-16 DIAGNOSIS — M542 Cervicalgia: Secondary | ICD-10-CM | POA: Diagnosis not present

## 2020-04-16 DIAGNOSIS — E782 Mixed hyperlipidemia: Secondary | ICD-10-CM | POA: Diagnosis not present

## 2020-04-16 MED ORDER — TRAMADOL HCL 50 MG PO TABS: 50.0000 mg | ORAL_TABLET | Freq: Three times a day (TID) | ORAL | 0 refills | Status: DC | PRN

## 2020-04-16 MED ORDER — AZELASTINE HCL 0.1 % NA SOLN
2.0000 | Freq: Two times a day (BID) | NASAL | 0 refills | Status: DC
Start: 2020-04-16 — End: 2021-04-06

## 2020-04-16 MED ORDER — TIZANIDINE HCL 2 MG PO TABS
2.0000 mg | ORAL_TABLET | Freq: Three times a day (TID) | ORAL | 0 refills | Status: DC | PRN
Start: 2020-04-16 — End: 2020-07-09

## 2020-04-16 NOTE — ED Provider Notes (Signed)
Virtual Visit via Video Note:  Ellen Avery  initiated request for Telemedicine visit with Ellen Avery. I connected with Ellen Avery  on 04/16/2020 at 1:34 PM  for a synchronized telemedicine visit using a video enabled HIPPA compliant telemedicine application. I verified that I am speaking with Ellen Avery  using two identifiers. Ellen Labate Jodell Cipro, Ellen Avery  was physically located in a [facility] Urgent care site and Ellen Avery was located at a different location.   The limitations of evaluation and management by telemedicine as well as the availability of in-person appointments were discussed. Patient was informed that she  may incur a bill ( including co-pay) for this virtual visit encounter. Ellen Avery  expressed understanding and gave verbal consent to proceed with virtual visit.     History of Present Illness:Ellen Avery  is a 54 y.o. female with history of cervical DDD, fibromyalgia comes in for few weeks worsening of chronic neck pain. Pain is to the middle neck/thoracic back. Denies radiation of pain, loss of grip strength. Ibuprofen 800mg  without relief.   History of insulin dependant DM, not controlled.  Past Medical History:  Diagnosis Date  . Anxiety   . Arthritis   . Chronic headaches   . DDD (degenerative disc disease), cervical   . Fibromyalgia   . GERD (gastroesophageal reflux disease)   . History of colon polyps   . History of psychosis   . Hypercholesteremia   . Hypertension   . IBS (irritable bowel syndrome)   . MDD (major depressive disorder)   . Right sided facial pain   . Seasonal allergic rhinitis   . Sinus congestion    12-24-2018 per pt no fever  . Type 2 diabetes mellitus treated with insulin ([place])   . Vitamin D deficiency   . Wears dentures    full upper,  partial lower  . Wears glasses     Allergies  Allergen Reactions  . Doxycycline Hives and Itching  . Flagyl [Metronidazole] Hives and Itching    Benadryl          Observations/Objective: General: Well appearing, nontoxic, no acute distress. Sitting comfortably. Head: Normocephalic, atraumatic Eye: No conjunctival injection, eyelid swelling. EOMI Neck: Full ROM.  ENT: Mucus membranes moist, no lip cracking. No obvious nasal drainage. Pulm: Speaking in full sentences without difficulty. Normal effort. No respiratory distress, accessory muscle use. Neuro: Normal mental status. Alert and oriented x 3.   Assessment and Plan: Will provide short course of tramadol for DDD. Ibuprofen, tizanidine for symptomatic management. Follow up with orthopedics/PCP for further management needed. Return precautions given.  Follow Up Instructions:    I discussed the assessment and treatment plan with the patient. The patient was provided an opportunity to ask questions and all were answered. The patient agreed with the plan and demonstrated an understanding of the instructions.   The patient was advised to call back or seek an in-person evaluation if the symptoms worsen or if the condition fails to improve as anticipated.  I provided 20 minutes of non-face-to-face time during this encounter.    Ok Edwards, Ellen Avery  04/16/2020 1:34 PM         Ok Edwards, Ellen Avery 04/16/20 1352

## 2020-04-16 NOTE — Discharge Instructions (Addendum)
Continue ibuprofen 800mg  three times a day. Tramadol as needed. Tizanidine as needed, discontinue flexeril. Follow up with PCP/orthopedics if symptoms not improving. If losing grip strength, go to the ED for further evaluation.

## 2020-04-18 DIAGNOSIS — H524 Presbyopia: Secondary | ICD-10-CM | POA: Diagnosis not present

## 2020-04-18 DIAGNOSIS — E119 Type 2 diabetes mellitus without complications: Secondary | ICD-10-CM | POA: Diagnosis not present

## 2020-04-19 DIAGNOSIS — E782 Mixed hyperlipidemia: Secondary | ICD-10-CM | POA: Diagnosis not present

## 2020-04-19 DIAGNOSIS — I10 Essential (primary) hypertension: Secondary | ICD-10-CM | POA: Diagnosis not present

## 2020-04-27 DIAGNOSIS — H5213 Myopia, bilateral: Secondary | ICD-10-CM | POA: Diagnosis not present

## 2020-05-10 ENCOUNTER — Ambulatory Visit
Admission: RE | Admit: 2020-05-10 | Discharge: 2020-05-10 | Disposition: A | Discharge status: Home / Self Care | Payer: Medicaid Other | Source: Ambulatory Visit | Attending: Critical Care Medicine | Admitting: Critical Care Medicine

## 2020-05-10 DIAGNOSIS — R918 Other nonspecific abnormal finding of lung field: Secondary | ICD-10-CM | POA: Diagnosis not present

## 2020-05-10 DIAGNOSIS — R911 Solitary pulmonary nodule: Secondary | ICD-10-CM

## 2020-05-11 ENCOUNTER — Telehealth: Payer: Self-pay | Admitting: Critical Care Medicine

## 2020-05-11 NOTE — Telephone Encounter (Signed)
Patient called for results from CT done on 05/10/2020. Results have not been reported by Dr. Carlis Abbott. Plan to call patient back as soon as results are reported.

## 2020-05-14 ENCOUNTER — Telehealth: Payer: Self-pay | Admitting: Critical Care Medicine

## 2020-05-14 NOTE — Telephone Encounter (Signed)
There is no documentation that Dr. Carlis Abbott reviewed scan. Dr. Carlis Abbott please advise on results.

## 2020-05-15 NOTE — Telephone Encounter (Signed)
Good news, the CT scan is stable. She still needs another one in 6 months to keep following it for a total of 2 years.  [name], DO 05/15/20 7:14 AM [facility]

## 2020-05-15 NOTE — Telephone Encounter (Signed)
Advised pt of results. Pt understood and nothing further is needed.   

## 2020-05-15 NOTE — Telephone Encounter (Signed)
ATC pt, no answer. Left message for pt to call back.  

## 2020-05-15 NOTE — Telephone Encounter (Signed)
Pt returning a phone call. Pt can be reached at [phone].

## 2020-05-15 NOTE — Telephone Encounter (Signed)
LMTCB x2 for pt 

## 2020-05-16 DIAGNOSIS — H5213 Myopia, bilateral: Secondary | ICD-10-CM | POA: Diagnosis not present

## 2020-05-16 DIAGNOSIS — H52223 Regular astigmatism, bilateral: Secondary | ICD-10-CM | POA: Diagnosis not present

## 2020-05-17 ENCOUNTER — Ambulatory Visit: Payer: Medicaid Other | Admitting: Critical Care Medicine

## 2020-06-08 ENCOUNTER — Ambulatory Visit (INDEPENDENT_AMBULATORY_CARE_PROVIDER_SITE_OTHER)
Admission: RE | Admit: 2020-06-08 | Discharge: 2020-06-08 | Disposition: A | Discharge status: Home / Self Care | Payer: Medicaid Other | Source: Ambulatory Visit

## 2020-06-08 DIAGNOSIS — M549 Dorsalgia, unspecified: Secondary | ICD-10-CM

## 2020-06-08 DIAGNOSIS — G8929 Other chronic pain: Secondary | ICD-10-CM

## 2020-06-08 DIAGNOSIS — M542 Cervicalgia: Secondary | ICD-10-CM | POA: Diagnosis not present

## 2020-06-08 NOTE — ED Provider Notes (Signed)
Virtual Visit via Video Note:  Ellen Avery  initiated request for Telemedicine visit with [facility] Urgent Care team. I connected with Markelle Asaro Soltau  on 06/08/2020 at 4:54 PM  for a synchronized telemedicine visit using a video enabled HIPPA compliant telemedicine application. I verified that I am speaking with Ellen Avery  using two identifiers. Sharion Balloon, NP  was physically located in a [facility] Urgent care site and PARASKEVI FUNEZ was located at a different location.   The limitations of evaluation and management by telemedicine as well as the availability of in-person appointments were discussed. Patient was informed that she  may incur a bill ( including co-pay) for this virtual visit encounter. Ellen Avery  expressed understanding and gave verbal consent to proceed with virtual visit.     History of Present Illness:Ellen Avery  is a 54 y.o. female presents for evaluation of neck and back pain.  She has chronic neck and back pain due to DDD and Fibromyalgia.  She states the pain is in the center of her back "running down my spine."  She takes Flexeril and gabapentinfor for her chronic pain.  She was seen for this same issue by video visit on 04/27/2020; she was instructed to follow up with her PCP or Ortho for further management of her pain.      Allergies  Allergen Reactions  . Doxycycline Hives and Itching  . Flagyl [Metronidazole] Hives and Itching    Benadryl      Past Medical History:  Diagnosis Date  . Anxiety   . Arthritis   . Chronic headaches   . DDD (degenerative disc disease), cervical   . Fibromyalgia   . GERD (gastroesophageal reflux disease)   . History of colon polyps   . History of psychosis   . Hypercholesteremia   . Hypertension   . IBS (irritable bowel syndrome)   . MDD (major depressive disorder)   . Right sided facial pain   . Seasonal allergic rhinitis   . Sinus congestion    12-24-2018 per pt no fever  . Type 2 diabetes mellitus  treated with insulin ([place])   . Vitamin D deficiency   . Wears dentures    full upper,  partial lower  . Wears glasses      Social History   Tobacco Use  . Smoking status: Current Every Day Smoker    Packs/day: 1.00    Years: 35.00    Pack years: 35.00    Types: Cigarettes  . Smokeless tobacco: Never Used  Vaping Use  . Vaping Use: Never used  Substance Use Topics  . Alcohol use: No  . Drug use: No        Observations/Objective: Physical Exam  VITALS: Patient denies fever. GENERAL: Alert, appears well and in no acute distress. HEENT: Atraumatic. Oral mucosa appears moist. NECK: Normal movements of the head and neck. CARDIOPULMONARY: No increased WOB. Speaking in clear sentences. I:E ratio WNL.  MS: Moves all visible extremities without noticeable abnormality. PSYCH: Pleasant and cooperative, well-groomed. Speech normal rate and rhythm. Affect is appropriate. Insight and judgement are appropriate. Attention is focused, linear, and appropriate.  NEURO: CN grossly intact. Oriented as arrived to appointment on time with no prompting. Moves both UE equally.  SKIN: No obvious lesions, wounds, erythema, or cyanosis noted on face or hands.   Assessment and Plan:    ICD-10-CM   1. Chronic midline back pain, unspecified back location  M54.9  G89.29   2. Neck pain  M54.2        Follow Up Instructions: Discussed with patient that her chronic pain needs to be managed by her PCP. Instructed her to go to the UC in person or to the ED if her pain is uncontrolled by her usual medications.      I discussed the assessment and treatment plan with the patient. The patient was provided an opportunity to ask questions and all were answered. The patient agreed with the plan and demonstrated an understanding of the instructions.   The patient was advised to call back or seek an in-person evaluation if the symptoms worsen or if the condition fails to improve as  anticipated.      Sharion Balloon, NP  06/08/2020 4:54 PM         Sharion Balloon, NP 06/08/20 1654

## 2020-06-08 NOTE — Discharge Instructions (Signed)
Follow up with your primary care provider or come here to be seen in person if your symptoms are not improving.

## 2020-07-09 ENCOUNTER — Ambulatory Visit (HOSPITAL_COMMUNITY)
Admission: EM | Admit: 2020-07-09 | Discharge: 2020-07-09 | Disposition: A | Discharge status: Home / Self Care | Payer: Medicaid Other | Attending: Emergency Medicine | Admitting: Emergency Medicine

## 2020-07-09 ENCOUNTER — Other Ambulatory Visit: Payer: Self-pay

## 2020-07-09 ENCOUNTER — Encounter (HOSPITAL_COMMUNITY): Payer: Self-pay | Admitting: Emergency Medicine

## 2020-07-09 DIAGNOSIS — E119 Type 2 diabetes mellitus without complications: Secondary | ICD-10-CM | POA: Insufficient documentation

## 2020-07-09 DIAGNOSIS — Z791 Long term (current) use of non-steroidal anti-inflammatories (NSAID): Secondary | ICD-10-CM | POA: Insufficient documentation

## 2020-07-09 DIAGNOSIS — M1990 Unspecified osteoarthritis, unspecified site: Secondary | ICD-10-CM | POA: Insufficient documentation

## 2020-07-09 DIAGNOSIS — K589 Irritable bowel syndrome without diarrhea: Secondary | ICD-10-CM | POA: Diagnosis not present

## 2020-07-09 DIAGNOSIS — F329 Major depressive disorder, single episode, unspecified: Secondary | ICD-10-CM | POA: Diagnosis not present

## 2020-07-09 DIAGNOSIS — I10 Essential (primary) hypertension: Secondary | ICD-10-CM | POA: Insufficient documentation

## 2020-07-09 DIAGNOSIS — E785 Hyperlipidemia, unspecified: Secondary | ICD-10-CM | POA: Insufficient documentation

## 2020-07-09 DIAGNOSIS — Z7982 Long term (current) use of aspirin: Secondary | ICD-10-CM | POA: Diagnosis not present

## 2020-07-09 DIAGNOSIS — K219 Gastro-esophageal reflux disease without esophagitis: Secondary | ICD-10-CM | POA: Diagnosis not present

## 2020-07-09 DIAGNOSIS — M797 Fibromyalgia: Secondary | ICD-10-CM | POA: Insufficient documentation

## 2020-07-09 DIAGNOSIS — J019 Acute sinusitis, unspecified: Secondary | ICD-10-CM

## 2020-07-09 DIAGNOSIS — M546 Pain in thoracic spine: Secondary | ICD-10-CM | POA: Diagnosis not present

## 2020-07-09 DIAGNOSIS — Z20822 Contact with and (suspected) exposure to covid-19: Secondary | ICD-10-CM | POA: Insufficient documentation

## 2020-07-09 DIAGNOSIS — F17210 Nicotine dependence, cigarettes, uncomplicated: Secondary | ICD-10-CM | POA: Insufficient documentation

## 2020-07-09 DIAGNOSIS — F419 Anxiety disorder, unspecified: Secondary | ICD-10-CM | POA: Insufficient documentation

## 2020-07-09 DIAGNOSIS — Z794 Long term (current) use of insulin: Secondary | ICD-10-CM | POA: Diagnosis not present

## 2020-07-09 DIAGNOSIS — Z79899 Other long term (current) drug therapy: Secondary | ICD-10-CM | POA: Insufficient documentation

## 2020-07-09 DIAGNOSIS — E7800 Pure hypercholesterolemia, unspecified: Secondary | ICD-10-CM | POA: Insufficient documentation

## 2020-07-09 DIAGNOSIS — G8929 Other chronic pain: Secondary | ICD-10-CM | POA: Insufficient documentation

## 2020-07-09 LAB — SARS CORONAVIRUS 2 (TAT 6-24 HRS): SARS Coronavirus 2: NEGATIVE

## 2020-07-09 MED ORDER — TIZANIDINE HCL 4 MG PO TABS
4.0000 mg | ORAL_TABLET | Freq: Four times a day (QID) | ORAL | 0 refills | Status: DC | PRN
Start: 2020-07-09 — End: 2020-08-16

## 2020-07-09 MED ORDER — AMOXICILLIN-POT CLAVULANATE 875-125 MG PO TABS
1.0000 | ORAL_TABLET | Freq: Two times a day (BID) | ORAL | 0 refills | Status: AC
Start: 2020-07-09 — End: 2020-07-16

## 2020-07-09 MED ORDER — NAPROXEN 500 MG PO TABS
500.0000 mg | ORAL_TABLET | Freq: Two times a day (BID) | ORAL | 0 refills | Status: DC
Start: 2020-07-09 — End: 2021-04-06

## 2020-07-09 NOTE — Discharge Instructions (Signed)
Covid test pending, monitor my chart for results Begin Augmentin twice daily for the next week Continue Zyrtec and Flonase  May try Naprosyn twice daily with food as alternative to ibuprofen for back pain Tizanidine prescribed, may cause some drowsiness Follow-up with primary care for continued management of back pain  Follow-up if any symptoms not improving or worsening

## 2020-07-09 NOTE — ED Provider Notes (Signed)
[place]    CSN: [id number] Arrival date & time: 07/09/20  1430      History   Chief Complaint Chief Complaint  Patient presents with  . URI    HPI Ellen Avery is a 54 y.o. female history of chronic back pain, GERD, hypertension, DM type II, presenting today for evaluation of sinus symptoms and back pain.  Patient reports she has history of allergies and has been having congestion intermittently over the past few weeks, taking Zyrtec and Flonase.  Over the past few days she has had worsening sinus pressure in frontal area.  Feels similar to prior sinusitis.  Also reports flaring of her chronic pain in her back.  Reports a pins and needle sensation.  Mainly located to superior aspect of thoracic area.  Denies any new injury or fall.  Taking gabapentin and Flexeril.  Does report more relief with taking tizanidine.  Using Motrin as needed.  Working on getting into pain management through her PCP.  HPI  Past Medical History:  Diagnosis Date  . Anxiety   . Arthritis   . Chronic headaches   . DDD (degenerative disc disease), cervical   . Fibromyalgia   . GERD (gastroesophageal reflux disease)   . History of colon polyps   . History of psychosis   . Hypercholesteremia   . Hypertension   . IBS (irritable bowel syndrome)   . MDD (major depressive disorder)   . Right sided facial pain   . Seasonal allergic rhinitis   . Sinus congestion    12-24-2018 per pt no fever  . Type 2 diabetes mellitus treated with insulin ([place])   . Vitamin D deficiency   . Wears dentures    full upper,  partial lower  . Wears glasses     Patient Active Problem List   Diagnosis Date Noted  . Chest pain in adult 07/20/2019  . IDDM (insulin dependent diabetes mellitus) 07/08/2018  . HTN (hypertension) 07/08/2018  . HLD (hyperlipidemia) 07/08/2018  . Altered mental status 03/02/2016  . MDD (major depressive disorder), recurrent episode, moderate ([place]) 03/02/2016  . Psychoses [facility])      Past Surgical History:  Procedure Laterality Date  . ARTERY BIOPSY Right 12/28/2018   Procedure: RIGHT BIOPSY TEMPORAL ARTERY;  Surgeon: Kieth Brightly, Arta Bruce, MD;  Location: [facility];  Service: General;  Laterality: Right;  . CARDIAC CATHETERIZATION    . MULTIPLE TOOTH EXTRACTIONS  04/ 2019    sedation  . TOTAL ABDOMINAL HYSTERECTOMY W/ BILATERAL SALPINGOOPHORECTOMY  2009    OB History   No obstetric history on file.      Home Medications    Prior to Admission medications   Medication Sig Start Date End Date Taking? Authorizing Provider  cetirizine (ZYRTEC) 10 MG tablet Take 10 mg by mouth daily.   Yes [provider]  DULoxetine (CYMBALTA) 60 MG capsule Take 1 capsule (60 mg total) by mouth daily. 04/03/20  Yes Sharion Balloon, NP  gabapentin (NEURONTIN) 100 MG capsule Take 1-2 capsules (100-200 mg total) by mouth See admin instructions. Take 100 mg in the morning, 100 mg at lunch, and 200 mg in the evening 04/03/20  Yes Sharion Balloon, NP  losartan-hydrochlorothiazide (HYZAAR) 100-12.5 MG tablet Take 1 tablet by mouth daily.  11/01/18  Yes [provider]  metFORMIN (GLUCOPHAGE) 1000 MG tablet Take 1,000 mg by mouth daily with breakfast.    Yes [provider]  rosuvastatin (CRESTOR) 10 MG tablet Take  10 mg by mouth at bedtime. 12/12/19  Yes [provider]  ACCU-CHEK AVIVA PLUS test strip USE TO CHECK BLOOD SUGAR 4 TIMES DAILY 05/25/18   [provider]  albuterol (VENTOLIN HFA) 108 (90 Base) MCG/ACT inhaler Inhale 1-2 puffs into the lungs every 6 (six) hours as needed for wheezing or shortness of breath. 01/10/20   Rodriguez Aguinaldo C, PA-C  amoxicillin-clavulanate (AUGMENTIN) 875-125 MG tablet Take 1 tablet by mouth every 12 (twelve) hours for 7 days. 07/09/20 07/16/20  Epic Tribbett C, PA-C  aspirin EC 81 MG tablet Take 81 mg by mouth daily.    [provider]  azelastine (ASTELIN) 0.1 % nasal spray Place 2 sprays into  both nostrils 2 (two) times daily. 04/16/20   Tasia Catchings, Amy V, PA-C  cetirizine-pseudoephedrine (ZYRTEC-D) 5-120 MG tablet Take 1 tablet by mouth daily. 10/25/19   Avegno, [name], FNP  Cholecalciferol (VITAMIN D3) 3000 units TABS Take 3,000 Units by mouth daily.    [provider]  dextromethorphan-guaiFENesin (MUCINEX DM) 30-600 MG 12hr tablet Take 1 tablet by mouth 2 (two) times daily. 01/10/20   Mekhi Sonn C, PA-C  Famotidine (PEPCID AC PO) Take by mouth as needed.    [provider]  fluticasone (FLONASE) 50 MCG/ACT nasal spray Place 1 spray into both nostrils daily. 04/03/20   Sharion Balloon, NP  hydrOXYzine (ATARAX/VISTARIL) 25 MG tablet Take 1 tablet (25 mg total) by mouth every 6 (six) hours as needed for anxiety. 09/13/18   [name], [name], MD  insulin glargine (LANTUS) 100 UNIT/ML injection Inject 50 Units into the skin at bedtime.     [provider]  JANUVIA 50 MG tablet Take 50 mg by mouth daily. 07/07/19   [provider]  LANTUS SOLOSTAR 100 UNIT/ML Solostar Pen SMARTSIG:55 Unit(s) SUB-Q Every Night 12/12/19   [provider]  magnesium hydroxide (MILK OF MAGNESIA) 400 MG/5ML suspension Take by mouth daily as needed for mild constipation.    [provider]  methocarbamol (ROBAXIN) 500 MG tablet Take 1 tablet (500 mg total) by mouth 2 (two) times daily. 02/11/20   Faustino Congress, NP  metoprolol tartrate (LOPRESSOR) 50 MG tablet Take 2 tablets (100 mg total) by mouth once for 1 dose. Take 2 hours prior to cardiac CTA. 07/20/19 07/20/19  Richardo Priest, MD  naproxen (NAPROSYN) 500 MG tablet Take 1 tablet (500 mg total) by mouth 2 (two) times daily. 07/09/20   Jakel Alphin C, PA-C  pseudoephedrine (SUDAFED) 60 MG tablet Take 60 mg by mouth every 4 (four) hours as needed for congestion.    [provider]  simvastatin (ZOCOR) 40 MG tablet Take 40 mg by mouth every evening. Reported on 03/01/2016    [provider]    sodium chloride (OCEAN) 0.65 % SOLN nasal spray Place 1 spray into both nostrils as needed for congestion. 08/30/18   Law, [name], PA-C  tiZANidine (ZANAFLEX) 4 MG tablet Take 1 tablet (4 mg total) by mouth every 6 (six) hours as needed for muscle spasms. 07/09/20   Eriel Dunckel, Elesa Hacker, PA-C    Family History Family History  Problem Relation Age of Onset  . Diabetes Mother   . Heart disease Paternal Grandmother   . Breast cancer Paternal Grandmother   . Diabetes Paternal Uncle   . Heart Problems Brother   . Breast cancer Maternal Aunt   . Head & neck cancer Cousin   . Cervical cancer Paternal Aunt   . Colon cancer  Neg Hx   . Colon polyps Neg Hx   . Esophageal cancer Neg Hx   . Rectal cancer Neg Hx   . Stomach cancer Neg Hx     Social History Social History   Tobacco Use  . Smoking status: Current Every Day Smoker    Packs/day: 1.00    Years: 35.00    Pack years: 35.00    Types: Cigarettes  . Smokeless tobacco: Never Used  Vaping Use  . Vaping Use: Never used  Substance Use Topics  . Alcohol use: No  . Drug use: No     Allergies   Doxycycline and Flagyl [metronidazole]   Review of Systems Review of Systems  Constitutional: Negative for activity change, appetite change, chills, fatigue and fever.  HENT: Positive for congestion, rhinorrhea and sinus pressure. Negative for ear pain, sore throat and trouble swallowing.   Eyes: Negative for discharge and redness.  Respiratory: Negative for cough, chest tightness and shortness of breath.   Cardiovascular: Negative for chest pain.  Gastrointestinal: Negative for abdominal pain, diarrhea, nausea and vomiting.  Musculoskeletal: Positive for back pain and myalgias.  Skin: Negative for rash.  Neurological: Positive for headaches. Negative for dizziness and light-headedness.     Physical Exam Triage Vital Signs ED Triage Vitals  Enc Vitals Group     BP      Pulse      Resp      Temp      Temp src      SpO2       Weight      Height      Head Circumference      Peak Flow      Pain Score      Pain Loc      Pain Edu?      Excl. in [place]?    No data found.  Updated Vital Signs BP 125/80 (BP Location: Right Arm) Comment (BP Location): large cuff  Pulse 82   Temp 98.6 F (37 C) (Oral)   Resp 18   SpO2 96%   Visual Acuity Right Eye Distance:   Left Eye Distance:   Bilateral Distance:    Right Eye Near:   Left Eye Near:    Bilateral Near:     Physical Exam Vitals and nursing note reviewed.  Constitutional:      Appearance: She is well-developed.     Comments: No acute distress  HENT:     Head: Normocephalic and atraumatic.     Ears:     Comments: Bilateral ears without tenderness to palpation of external auricle, tragus and mastoid, EAC's without erythema or swelling, TM's with good bony landmarks and cone of light. Non erythematous.     Nose: Nose normal.     Mouth/Throat:     Comments: Oral mucosa pink and moist, no tonsillar enlargement or exudate. Posterior pharynx patent and nonerythematous, no uvula deviation or swelling. Normal phonation. Eyes:     Conjunctiva/sclera: Conjunctivae normal.  Cardiovascular:     Rate and Rhythm: Normal rate.  Pulmonary:     Effort: Pulmonary effort is normal. No respiratory distress.     Comments: Breathing comfortably at rest, CTABL, no wheezing, rales or other adventitious sounds auscultated Abdominal:     General: There is no distension.  Musculoskeletal:        General: Normal range of motion.     Cervical back: Neck supple.     Comments: Tender to palpation of superior thoracic area  midline and bilateral superior trapezius and thoracic areas, more prominent on right  Full active range of motion of neck, full active range of motion of bilateral shoulders  Skin:    General: Skin is warm and dry.  Neurological:     Mental Status: She is alert and oriented to person, place, and time.      UC Treatments / Results  Labs (all labs  ordered are listed, but only abnormal results are displayed) Labs Reviewed  SARS CORONAVIRUS 2 (TAT 6-24 HRS)    EKG   Radiology No results found.  Procedures Procedures (including critical care time)  Medications Ordered in UC Medications - No data to display  Initial Impression / Assessment and Plan / UC Course  I have reviewed the triage vital signs and the nursing notes.  Pertinent labs & imaging results that were available during my care of the patient were reviewed by me and considered in my medical decision making (see chart for details).     1.  Sinusitis-recent worsening after weeks of congestion, will treat with Augmentin, continue Zyrtec and Flonase.  Deferring steroids given patient is diabetic.  2.  Chronic back pain-will provide Naprosyn as alternative NSAID to help with back pain as well as sinus pressure, tizanidine as alternative to Flexeril.  Continue to follow-up with PCP for chronic pain.  Discussed strict return precautions. Patient verbalized understanding and is agreeable with plan.  Final Clinical Impressions(s) / UC Diagnoses   Final diagnoses:  Acute sinusitis with symptoms > 10 days  Acute bilateral thoracic back pain     Discharge Instructions     Covid test pending, monitor my chart for results Begin Augmentin twice daily for the next week Continue Zyrtec and Flonase  May try Naprosyn twice daily with food as alternative to ibuprofen for back pain Tizanidine prescribed, may cause some drowsiness Follow-up with primary care for continued management of back pain  Follow-up if any symptoms not improving or worsening    ED Prescriptions    Medication Sig Dispense Auth. Provider   amoxicillin-clavulanate (AUGMENTIN) 875-125 MG tablet Take 1 tablet by mouth every 12 (twelve) hours for 7 days. 14 tablet Christena Sunderlin C, PA-C   tiZANidine (ZANAFLEX) 4 MG tablet Take 1 tablet (4 mg total) by mouth every 6 (six) hours as needed for muscle  spasms. 30 tablet Torin Modica C, PA-C   naproxen (NAPROSYN) 500 MG tablet Take 1 tablet (500 mg total) by mouth 2 (two) times daily. 30 tablet Davi Kroon, [place] C, PA-C     I have reviewed the PDMP during this encounter.   Janith Lima, [place] 07/10/20 [phone]

## 2020-07-09 NOTE — ED Notes (Signed)
Declined covid test at this time

## 2020-07-09 NOTE — ED Triage Notes (Signed)
Has head congestion and facial pressure. These symptoms started on week ago Patient taking zytrtec.   Patient also reports pins and needles feeling in spine.  (chronic pain)

## 2020-08-03 IMAGING — CT CT CHEST W/O CM
2 of 4 series · 15 of 36 positions shown, 18 images · non-contrast
Comparison: PET-CT 08/08/2019. CT chest 07/17/2019

CLINICAL DATA: Follow-up lung nodule

EXAM:
CT CHEST WITHOUT CONTRAST
TECHNIQUE: Multidetector CT imaging of the chest was performed following the
standard protocol without IV contrast.

[Series 4: thorax 2.0 · axial · 0.98mm/px · z∈[+1145,+1387]mm · 12 of 137 slices shown, 15 images]
[im 8/137  mediastinal]
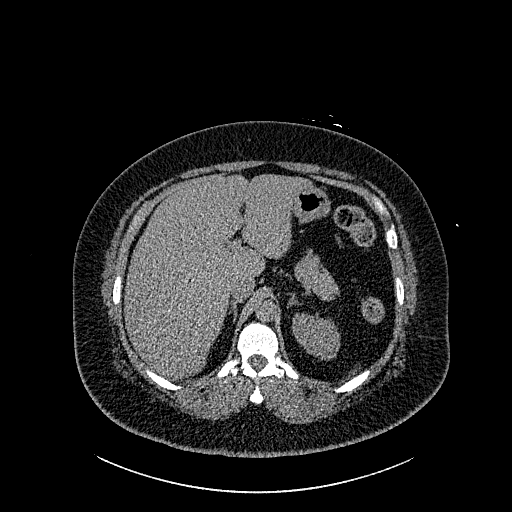
[im 8/137  lung]
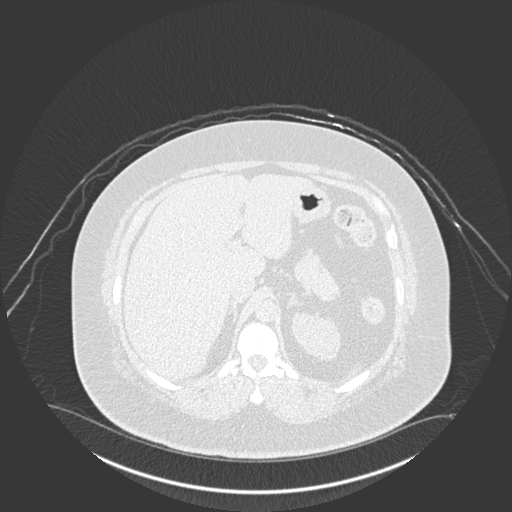
[im 22/137  lung]
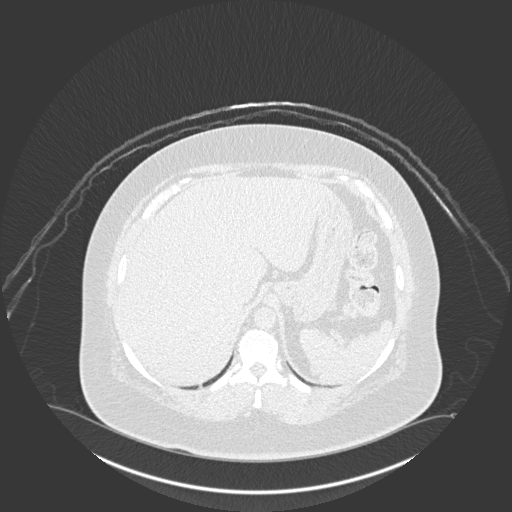
[im 29/137  lung]
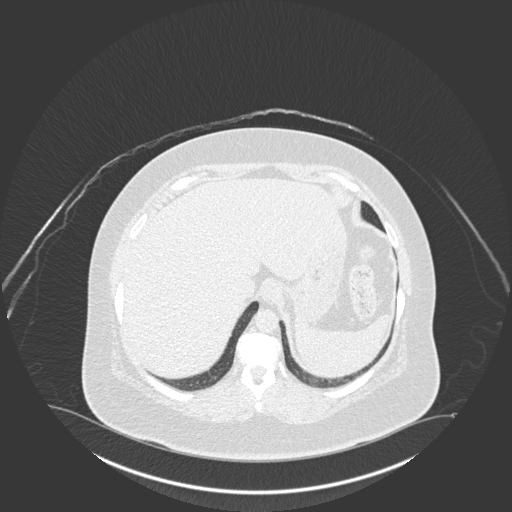
[im 43/137  lung]
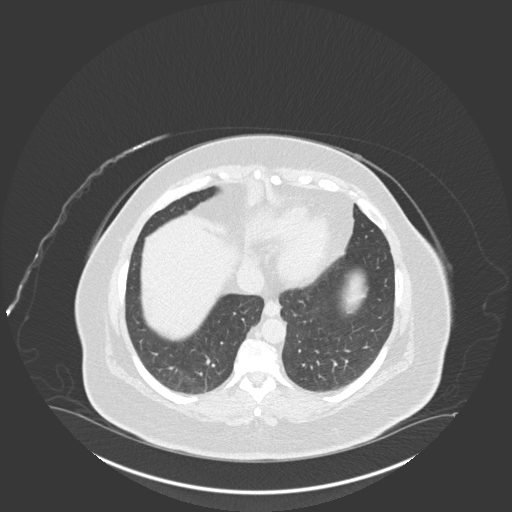
[im 51/137  mediastinal]
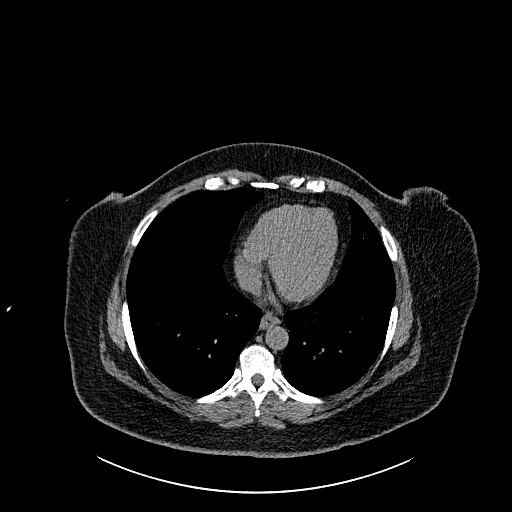
[im 51/137  lung]
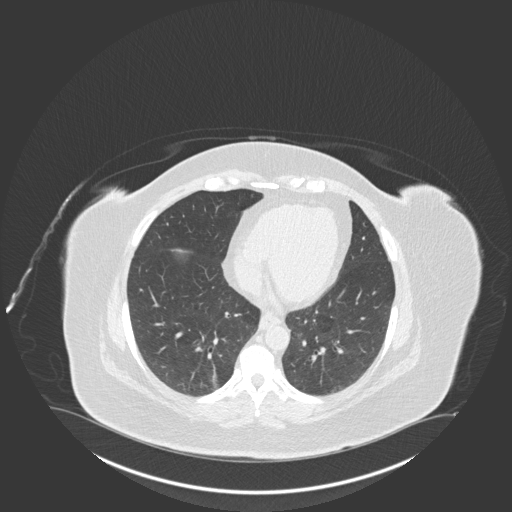
[im 65/137  lung]
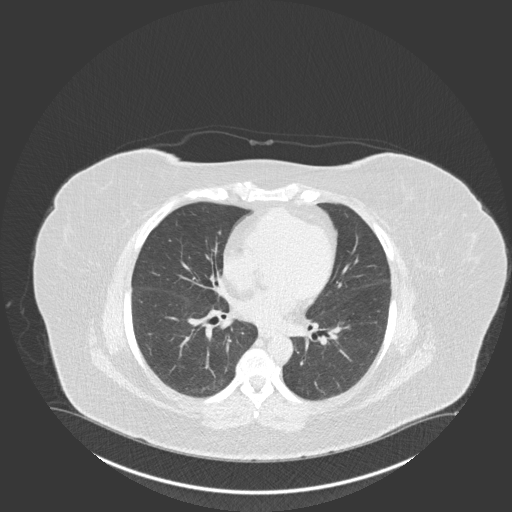
[im 72/137  lung]
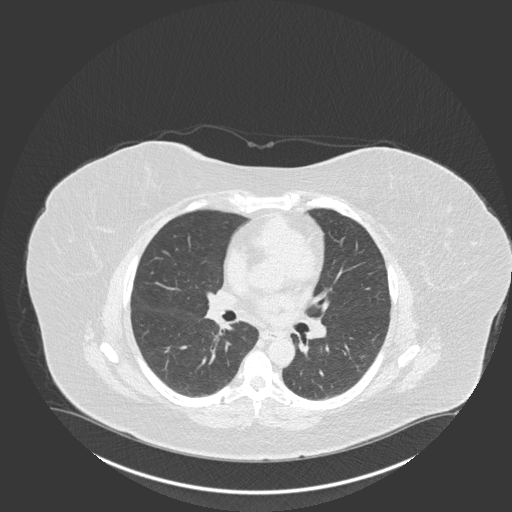
[im 86/137  lung]
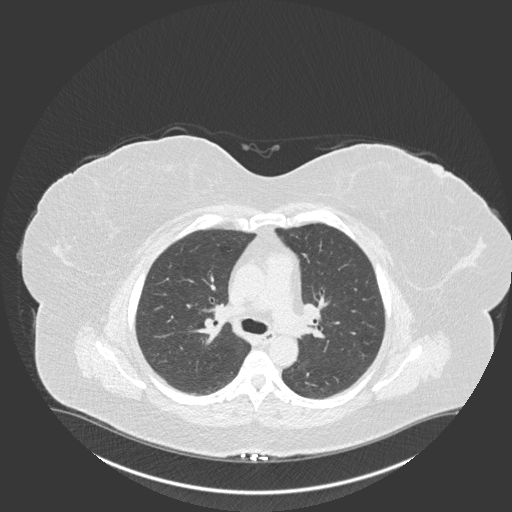
[im 94/137  mediastinal]
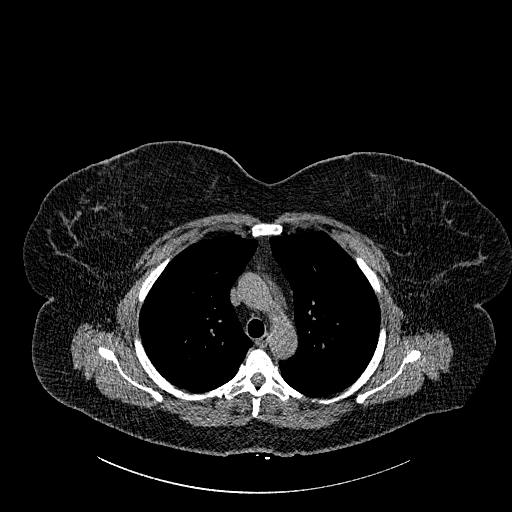
[im 94/137  lung]
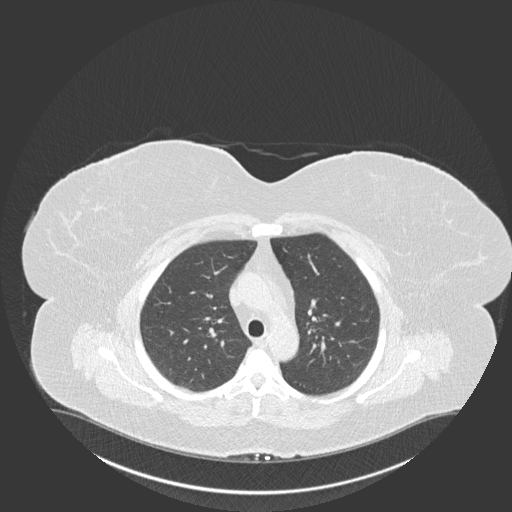
[im 108/137  lung]
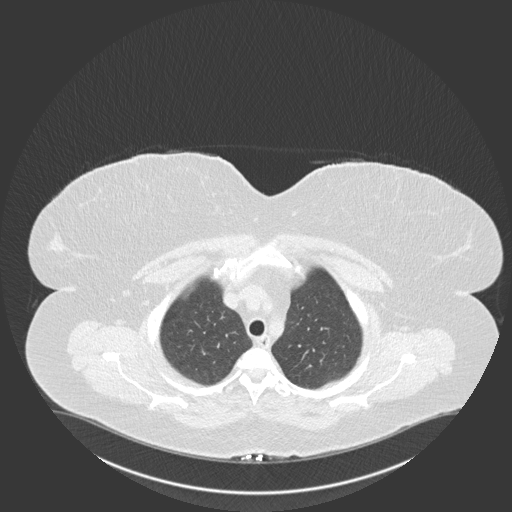
[im 115/137  lung]
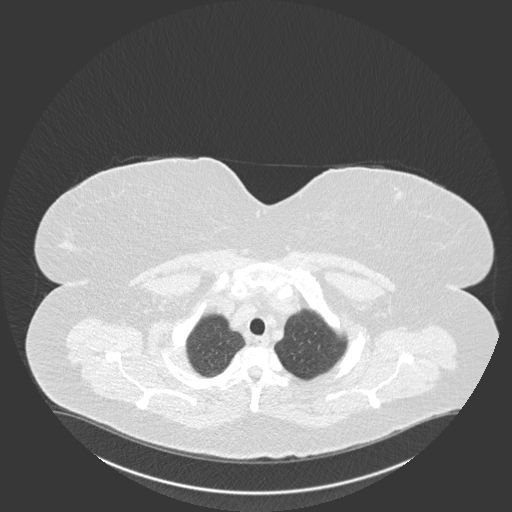
[im 129/137  lung]
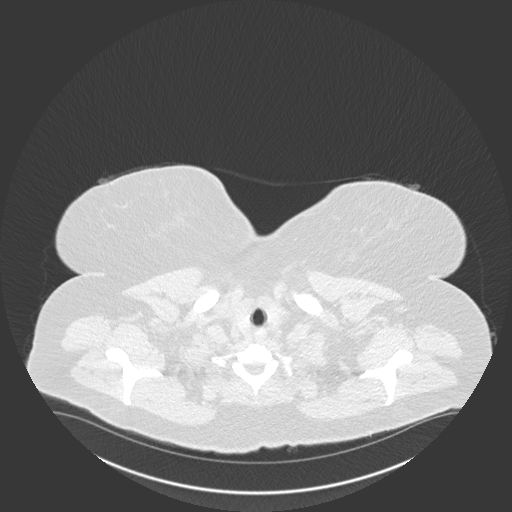

[Series 7: coronal · coronal · 0.57mm/px · 3 of 116 slices shown]
[im 24/116  lung]
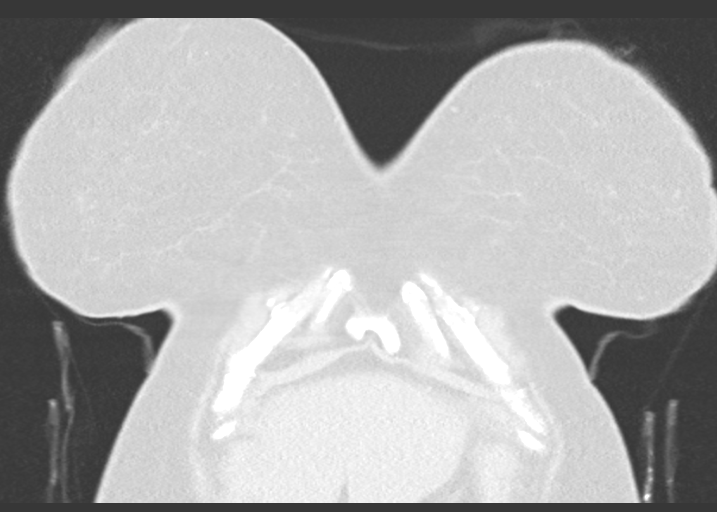
[im 47/116  lung]
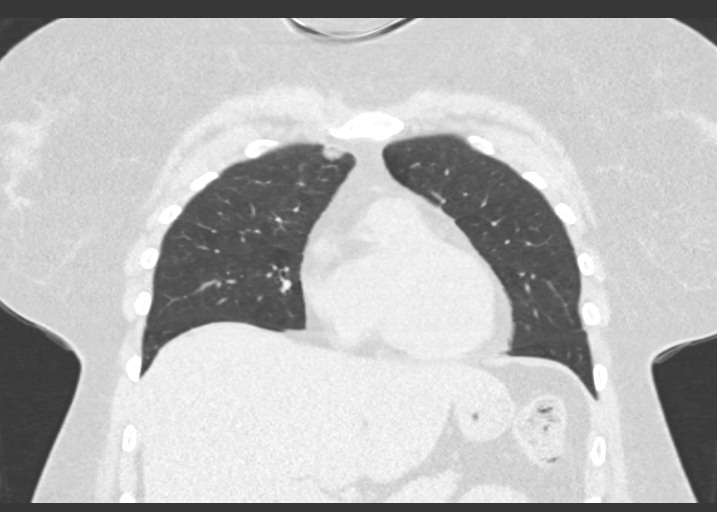
[im 70/116  lung]
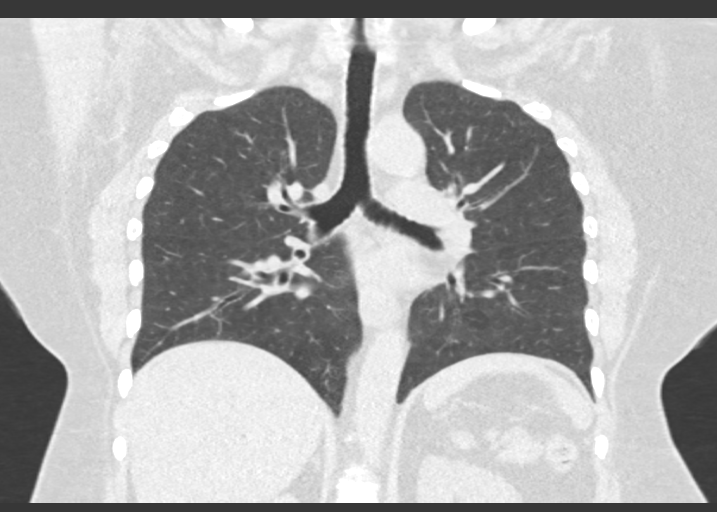

[15 of 36 positions shown; findings below may reference images not displayed]

FINDINGS: Cardiovascular: No significant vascular findings. Normal heart size.
No pericardial effusion.

Mediastinum/Nodes: No enlarged mediastinal or axillary lymph nodes.
Thyroid gland, trachea, and esophagus demonstrate no significant
findings.

Lungs/Pleura: Redemonstration of a well circumscribed ovoid 1.6 cm
pulmonary nodule within the medial aspect of the right upper lobe
(series 6, image 39). The lungs are otherwise clear. No additional
pulmonary nodules. No focal airspace consolidation, pleural
effusion, or pneumothorax.

Upper Abdomen: No acute abnormality.

Musculoskeletal: No acute osseous findings. No suspicious osseous
lesion.
IMPRESSION: 1. Stable 1.6 cm well-circumscribed pulmonary nodule within the
medial aspect of the right upper lobe. Findings favor a benign
etiology such as a hamartoma. Continued surveillance to confirm 2
years of stability is suggested.
2. No new pulmonary nodules or acute cardiopulmonary findings.

## 2020-08-06 ENCOUNTER — Ambulatory Visit (HOSPITAL_COMMUNITY)
Admission: EM | Admit: 2020-08-06 | Discharge: 2020-08-06 | Disposition: A | Discharge status: Home / Self Care | Payer: Medicaid Other | Attending: Family Medicine | Admitting: Family Medicine

## 2020-08-06 ENCOUNTER — Other Ambulatory Visit: Payer: Self-pay

## 2020-08-06 ENCOUNTER — Encounter (HOSPITAL_COMMUNITY): Payer: Self-pay

## 2020-08-06 DIAGNOSIS — M62838 Other muscle spasm: Secondary | ICD-10-CM | POA: Diagnosis not present

## 2020-08-06 DIAGNOSIS — B349 Viral infection, unspecified: Secondary | ICD-10-CM

## 2020-08-06 DIAGNOSIS — M542 Cervicalgia: Secondary | ICD-10-CM

## 2020-08-06 MED ORDER — TRAMADOL HCL 50 MG PO TABS: 50.0000 mg | ORAL_TABLET | Freq: Four times a day (QID) | ORAL | 0 refills | Status: DC | PRN

## 2020-08-06 NOTE — ED Provider Notes (Signed)
[place]   [id number] 08/06/20 Arrival Time: 1106  ASSESSMENT & PLAN:  1. Muscle spasms of neck   2. Cervical pain (neck)   3. Viral illness      Declines COVID testing.  Continue muscle relaxer in addition to: Meds ordered this encounter  Medications  . traMADol (ULTRAM) 50 MG tablet    Sig: Take 1 tablet (50 mg total) by mouth every 6 (six) hours as needed.    Dispense:  15 tablet    Refill:  0    OTC symptom care as needed.  [place] Controlled Substances Registry consulted for this patient. I feel the risk/benefit ratio today is favorable for proceeding with this prescription for a controlled substance. Medication sedation precautions given.    Follow-up Information    Schedule an appointment as soon as possible for a visit  with Pavelock, Ralene Bathe, MD.   Specialty: Internal Medicine Contact information: [address] [place] [address] [phone]               Reviewed expectations re: course of current medical issues. Questions answered. Outlined signs and symptoms indicating need for more acute intervention. Understanding verbalized. After Visit Summary given.   SUBJECTIVE: History from: patient. Ellen Avery is a 54 y.o. female who reports nasal congestion, runny nose for the past week. Slowly improving. Afebrile. No current coughing or SOB. Also reports chronic neck pain; "have a bulging disk" dx by MRI in the past; chronic neck problems. No new injury/trauma. No extremity sensation changes or weakness. Motrin without much relief. Flexeril helps some.   OBJECTIVE:  Vitals:   08/06/20 1349  BP: 130/85  Pulse: 91  Resp: 17  Temp: 98.9 F (37.2 C)  TempSrc: Oral  SpO2: 99%    General appearance: alert; no distress Eyes: PERRLA; EOMI; conjunctiva normal HENT: [place]; AT; nasal congestion Neck: supple  Lungs: speaks full sentences without difficulty; unlabored Extremities: no edema Skin: warm and dry Neurologic:  normal gait Psychological: alert and cooperative; normal mood and affect   Allergies  Allergen Reactions  . Doxycycline Hives and Itching  . Flagyl [Metronidazole] Hives and Itching    Benadryl     Past Medical History:  Diagnosis Date  . Anxiety   . Arthritis   . Chronic headaches   . DDD (degenerative disc disease), cervical   . Fibromyalgia   . GERD (gastroesophageal reflux disease)   . History of colon polyps   . History of psychosis   . Hypercholesteremia   . Hypertension   . IBS (irritable bowel syndrome)   . MDD (major depressive disorder)   . Right sided facial pain   . Seasonal allergic rhinitis   . Sinus congestion    12-24-2018 per pt no fever  . Type 2 diabetes mellitus treated with insulin ([place])   . Vitamin D deficiency   . Wears dentures    full upper,  partial lower  . Wears glasses    Social History   Socioeconomic History  . Marital status: Divorced    Spouse name: Not on file  . Number of children: Not on file  . Years of education: Not on file  . Highest education level: Not on file  Occupational History  . Not on file  Tobacco Use  . Smoking status: Current Every Day Smoker    Packs/day: 1.00    Years: 35.00    Pack years: 35.00    Types: Cigarettes  . Smokeless tobacco:  Never Used  Vaping Use  . Vaping Use: Never used  Substance and Sexual Activity  . Alcohol use: No  . Drug use: No  . Sexual activity: Not Currently    Birth control/protection: Surgical  Other Topics Concern  . Not on file  Social History Narrative  . Not on file   Social Determinants of Health   Financial Resource Strain:   . Difficulty of Paying Living Expenses: Not on file  Food Insecurity:   . Worried About Charity fundraiser in the Last Year: Not on file  . Ran Out of Food in the Last Year: Not on file  Transportation Needs:   . Lack of Transportation (Medical): Not on file  . Lack of Transportation (Non-Medical): Not on file  Physical Activity:   .  Days of Exercise per Week: Not on file  . Minutes of Exercise per Session: Not on file  Stress:   . Feeling of Stress : Not on file  Social Connections:   . Frequency of Communication with Friends and Family: Not on file  . Frequency of Social Gatherings with Friends and Family: Not on file  . Attends Religious Services: Not on file  . Active Member of Clubs or Organizations: Not on file  . Attends Archivist Meetings: Not on file  . Marital Status: Not on file  Intimate Partner Violence:   . Fear of Current or Ex-Partner: Not on file  . Emotionally Abused: Not on file  . Physically Abused: Not on file  . Sexually Abused: Not on file   Family History  Problem Relation Age of Onset  . Diabetes Mother   . Heart disease Paternal Grandmother   . Breast cancer Paternal Grandmother   . Diabetes Paternal Uncle   . Heart Problems Brother   . Breast cancer Maternal Aunt   . Head & neck cancer Cousin   . Cervical cancer Paternal Aunt   . Colon cancer Neg Hx   . Colon polyps Neg Hx   . Esophageal cancer Neg Hx   . Rectal cancer Neg Hx   . Stomach cancer Neg Hx    Past Surgical History:  Procedure Laterality Date  . ARTERY BIOPSY Right 12/28/2018   Procedure: RIGHT BIOPSY TEMPORAL ARTERY;  Surgeon: Kieth Brightly, [place]ta Bruce, MD;  Location: [facility];  Service: General;  Laterality: Right;  . CARDIAC CATHETERIZATION    . MULTIPLE TOOTH EXTRACTIONS  04/ 2019    sedation  . TOTAL ABDOMINAL HYSTERECTOMY W/ BILATERAL SALPINGOOPHORECTOMY  2009     Vanessa Kick, MD 08/06/20 [phone]

## 2020-08-06 NOTE — ED Triage Notes (Addendum)
Pt is here with sinues that started a week ago, pt states she has a hx of neck pain due to a fall 5 years ago, pt has taken Motrin & prescribed muscle relaxer's to relieve discomfort.

## 2020-08-06 NOTE — Discharge Instructions (Signed)
Be aware, you have been prescribed pain medications that may cause drowsiness. Do not combine with alcohol or other illicit drugs. Please do not drive, operate heavy machinery, or take part in activities that require making important decisions while on this medication as your judgement may be clouded.  

## 2020-08-16 ENCOUNTER — Encounter (HOSPITAL_COMMUNITY): Payer: Self-pay | Admitting: Emergency Medicine

## 2020-08-16 ENCOUNTER — Emergency Department (HOSPITAL_COMMUNITY)
Admission: EM | Admit: 2020-08-16 | Discharge: 2020-08-16 | Disposition: A | Discharge status: Home / Self Care | Payer: Medicaid Other | Attending: Emergency Medicine | Admitting: Emergency Medicine

## 2020-08-16 ENCOUNTER — Other Ambulatory Visit: Payer: Self-pay

## 2020-08-16 DIAGNOSIS — Z79899 Other long term (current) drug therapy: Secondary | ICD-10-CM | POA: Insufficient documentation

## 2020-08-16 DIAGNOSIS — M791 Myalgia, unspecified site: Secondary | ICD-10-CM | POA: Insufficient documentation

## 2020-08-16 DIAGNOSIS — I10 Essential (primary) hypertension: Secondary | ICD-10-CM | POA: Insufficient documentation

## 2020-08-16 DIAGNOSIS — R197 Diarrhea, unspecified: Secondary | ICD-10-CM | POA: Diagnosis not present

## 2020-08-16 DIAGNOSIS — Z20822 Contact with and (suspected) exposure to covid-19: Secondary | ICD-10-CM | POA: Diagnosis not present

## 2020-08-16 DIAGNOSIS — R05 Cough: Secondary | ICD-10-CM | POA: Insufficient documentation

## 2020-08-16 DIAGNOSIS — J111 Influenza due to unidentified influenza virus with other respiratory manifestations: Secondary | ICD-10-CM | POA: Diagnosis not present

## 2020-08-16 DIAGNOSIS — R110 Nausea: Secondary | ICD-10-CM | POA: Diagnosis not present

## 2020-08-16 DIAGNOSIS — F17210 Nicotine dependence, cigarettes, uncomplicated: Secondary | ICD-10-CM | POA: Diagnosis not present

## 2020-08-16 DIAGNOSIS — R519 Headache, unspecified: Secondary | ICD-10-CM | POA: Diagnosis present

## 2020-08-16 DIAGNOSIS — Z794 Long term (current) use of insulin: Secondary | ICD-10-CM | POA: Diagnosis not present

## 2020-08-16 DIAGNOSIS — Z7982 Long term (current) use of aspirin: Secondary | ICD-10-CM | POA: Insufficient documentation

## 2020-08-16 DIAGNOSIS — R6889 Other general symptoms and signs: Secondary | ICD-10-CM

## 2020-08-16 DIAGNOSIS — R5383 Other fatigue: Secondary | ICD-10-CM | POA: Diagnosis not present

## 2020-08-16 DIAGNOSIS — E119 Type 2 diabetes mellitus without complications: Secondary | ICD-10-CM | POA: Insufficient documentation

## 2020-08-16 LAB — COMPREHENSIVE METABOLIC PANEL
ALT: 16 U/L (ref 0–44)
AST: 14 U/L — ABNORMAL LOW (ref 15–41)
Albumin: 3.5 g/dL (ref 3.5–5.0)
Alkaline Phosphatase: 89 U/L (ref 38–126)
Anion gap: 13 (ref 5–15)
BUN: 9 mg/dL (ref 6–20)
CO2: 23 mmol/L (ref 22–32)
Calcium: 9.4 mg/dL (ref 8.9–10.3)
Chloride: 100 mmol/L (ref 98–111)
Creatinine, Ser: 0.96 mg/dL (ref 0.44–1.00)
GFR calc Af Amer: >60 mL/min (ref >60)
GFR calc non Af Amer: >60 mL/min (ref >60)
Glucose, Bld: 316 mg/dL — ABNORMAL HIGH (ref 70–99)
Potassium: 3.9 mmol/L (ref 3.5–5.1)
Sodium: 136 mmol/L (ref 135–145)
Total Bilirubin: 0.4 mg/dL (ref 0.3–1.2)
Total Protein: 6.9 g/dL (ref 6.5–8.1)

## 2020-08-16 LAB — CBC
HCT: 39.2 % (ref 36.0–46.0)
Hemoglobin: 12.6 g/dL (ref 12.0–15.0)
MCH: 27.1 pg (ref 26.0–34.0)
MCHC: 32.1 g/dL (ref 30.0–36.0)
MCV: 84.3 fL (ref 80.0–100.0)
Platelets: 358 10*3/uL (ref 150–400)
RBC: 4.65 MIL/uL (ref 3.87–5.11)
RDW: 12.5 % (ref 11.5–15.5)
WBC: 9.5 10*3/uL (ref 4.0–10.5)
nRBC: 0 % (ref 0.0–0.2)

## 2020-08-16 LAB — URINALYSIS, ROUTINE W REFLEX MICROSCOPIC
Bilirubin Urine: NEGATIVE
Glucose, UA: >=500 mg/dL — AB
Hgb urine dipstick: NEGATIVE
Ketones, ur: 5 mg/dL — AB
Leukocytes,Ua: NEGATIVE
Nitrite: NEGATIVE
Protein, ur: 30 mg/dL — AB
Specific Gravity, Urine: 1.03 (ref 1.005–1.030)
pH: 5 (ref 5.0–8.0)

## 2020-08-16 LAB — LIPASE, BLOOD: Lipase: 35 U/L (ref 11–51)

## 2020-08-16 LAB — I-STAT BETA HCG BLOOD, ED (MC, WL, AP ONLY): I-stat hCG, quantitative: <5 m[IU]/mL (ref <5)

## 2020-08-16 LAB — SARS CORONAVIRUS 2 BY RT PCR (HOSPITAL ORDER, PERFORMED IN ~~LOC~~ HOSPITAL LAB): SARS Coronavirus 2: NEGATIVE

## 2020-08-16 MED ORDER — ONDANSETRON HCL 4 MG/2ML IJ SOLN: 4.0000 mg | Freq: Once | INTRAMUSCULAR | Status: DC

## 2020-08-16 MED ORDER — TIZANIDINE HCL 4 MG PO TABS
4.0000 mg | ORAL_TABLET | Freq: Four times a day (QID) | ORAL | 0 refills | Status: DC | PRN
Start: 2020-08-16 — End: 2021-04-06

## 2020-08-16 MED ORDER — ONDANSETRON 4 MG PO TBDP
4.0000 mg | ORAL_TABLET | Freq: Three times a day (TID) | ORAL | 0 refills | Status: DC | PRN
Start: 2020-08-16 — End: 2021-04-06

## 2020-08-16 MED ORDER — CYCLOBENZAPRINE HCL 10 MG PO TABS
5.0000 mg | ORAL_TABLET | Freq: Once | ORAL | Status: AC
  Administered 2020-08-16: 5 mg via ORAL
  Filled 2020-08-16: qty 1

## 2020-08-16 MED ORDER — LACTATED RINGERS IV BOLUS: 1000.0000 mL | Freq: Once | INTRAVENOUS | Status: DC

## 2020-08-16 MED ORDER — KETOROLAC TROMETHAMINE 15 MG/ML IJ SOLN: 15.0000 mg | Freq: Once | INTRAMUSCULAR | Status: DC

## 2020-08-16 NOTE — ED Provider Notes (Signed)
[facility] EMERGENCY DEPARTMENT Provider Note   CSN: [id number] Arrival date & time: 08/16/20  1138     History Chief Complaint  Patient presents with  . Covid sx    Ellen Avery is a 54 y.o. female past medical history of hypertension, hypercholesterolemia, depression, fibromyalgia, obesity, insulin-dependent diabetes presents the ED today for evaluation of 2 weeks of headache, myalgias, dry cough, nausea and diarrhea.  She states that symptoms started 2 weeks ago, gradually improved and then worsened again.  She is tolerating p.o. and has not had any vomiting, has not had any known fevers and has had no sick contacts.  She is primarily concerned about her nausea and the fact that she might have Covid.  The history is provided by the patient.  Illness Quality:  Nausea, myalgias, headache Severity:  Moderate Onset quality:  Gradual Duration:  2 weeks Timing:  Constant Progression:  Worsening Chronicity:  New Context:  No known sick contacts. Associated symptoms: cough, diarrhea, fatigue and nausea   Associated symptoms: no abdominal pain, no chest pain, no fever, no headaches, no rash, no shortness of breath and no vomiting        Past Medical History:  Diagnosis Date  . Anxiety   . Arthritis   . Chronic headaches   . DDD (degenerative disc disease), cervical   . Fibromyalgia   . GERD (gastroesophageal reflux disease)   . History of colon polyps   . History of psychosis   . Hypercholesteremia   . Hypertension   . IBS (irritable bowel syndrome)   . MDD (major depressive disorder)   . Right sided facial pain   . Seasonal allergic rhinitis   . Sinus congestion    12-24-2018 per pt no fever  . Type 2 diabetes mellitus treated with insulin ([place])   . Vitamin D deficiency   . Wears dentures    full upper,  partial lower  . Wears glasses     Patient Active Problem List   Diagnosis Date Noted  . Chest pain in adult 07/20/2019  . IDDM (insulin  dependent diabetes mellitus) 07/08/2018  . HTN (hypertension) 07/08/2018  . HLD (hyperlipidemia) 07/08/2018  . Altered mental status 03/02/2016  . MDD (major depressive disorder), recurrent episode, moderate ([place]) 03/02/2016  . Psychoses [facility])     Past Surgical History:  Procedure Laterality Date  . ARTERY BIOPSY Right 12/28/2018   Procedure: RIGHT BIOPSY TEMPORAL ARTERY;  Surgeon: Kieth Brightly, Arta Bruce, MD;  Location: [facility];  Service: General;  Laterality: Right;  . CARDIAC CATHETERIZATION    . MULTIPLE TOOTH EXTRACTIONS  04/ 2019    sedation  . TOTAL ABDOMINAL HYSTERECTOMY W/ BILATERAL SALPINGOOPHORECTOMY  2009     OB History   No obstetric history on file.     Family History  Problem Relation Age of Onset  . Diabetes Mother   . Heart disease Paternal Grandmother   . Breast cancer Paternal Grandmother   . Diabetes Paternal Uncle   . Heart Problems Brother   . Breast cancer Maternal Aunt   . Head & neck cancer Cousin   . Cervical cancer Paternal Aunt   . Colon cancer Neg Hx   . Colon polyps Neg Hx   . Esophageal cancer Neg Hx   . Rectal cancer Neg Hx   . Stomach cancer Neg Hx     Social History   Tobacco Use  . Smoking status: Current Every Day Smoker    Packs/day:  1.00    Years: 35.00    Pack years: 35.00    Types: Cigarettes  . Smokeless tobacco: Never Used  Vaping Use  . Vaping Use: Never used  Substance Use Topics  . Alcohol use: No  . Drug use: No    Home Medications Prior to Admission medications   Medication Sig Start Date End Date Taking? Authorizing Provider  ACCU-CHEK AVIVA PLUS test strip USE TO CHECK BLOOD SUGAR 4 TIMES DAILY 05/25/18   [provider]  albuterol (VENTOLIN HFA) 108 (90 Base) MCG/ACT inhaler Inhale 1-2 puffs into the lungs every 6 (six) hours as needed for wheezing or shortness of breath. 01/10/20   Wieters, Hallie C, PA-C  aspirin EC 81 MG tablet Take 81 mg by mouth daily.    [provider]    azelastine (ASTELIN) 0.1 % nasal spray Place 2 sprays into both nostrils 2 (two) times daily. 04/16/20   Tasia Catchings, Amy V, PA-C  cetirizine (ZYRTEC) 10 MG tablet Take 10 mg by mouth daily.    [provider]  cetirizine-pseudoephedrine (ZYRTEC-D) 5-120 MG tablet Take 1 tablet by mouth daily. 10/25/19   Avegno, [name], FNP  Cholecalciferol (VITAMIN D3) 3000 units TABS Take 3,000 Units by mouth daily.    [provider]  dextromethorphan-guaiFENesin (MUCINEX DM) 30-600 MG 12hr tablet Take 1 tablet by mouth 2 (two) times daily. 01/10/20   Wieters, Hallie C, PA-C  DULoxetine (CYMBALTA) 60 MG capsule Take 1 capsule (60 mg total) by mouth daily. 04/03/20   Sharion Balloon, NP  Famotidine (PEPCID AC PO) Take by mouth as needed.    [provider]  fluticasone (FLONASE) 50 MCG/ACT nasal spray Place 1 spray into both nostrils daily. 04/03/20   Sharion Balloon, NP  gabapentin (NEURONTIN) 100 MG capsule Take 1-2 capsules (100-200 mg total) by mouth See admin instructions. Take 100 mg in the morning, 100 mg at lunch, and 200 mg in the evening 04/03/20   Sharion Balloon, NP  hydrOXYzine (ATARAX/VISTARIL) 25 MG tablet Take 1 tablet (25 mg total) by mouth every 6 (six) hours as needed for anxiety. 09/13/18   [name], [name], MD  insulin glargine (LANTUS) 100 UNIT/ML injection Inject 50 Units into the skin at bedtime.     [provider]  JANUVIA 50 MG tablet Take 50 mg by mouth daily. 07/07/19   [provider]  LANTUS SOLOSTAR 100 UNIT/ML Solostar Pen SMARTSIG:55 Unit(s) SUB-Q Every Night 12/12/19   [provider]  losartan-hydrochlorothiazide (HYZAAR) 100-12.5 MG tablet Take 1 tablet by mouth daily.  11/01/18   [provider]  magnesium hydroxide (MILK OF MAGNESIA) 400 MG/5ML suspension Take by mouth daily as needed for mild constipation.    [provider]  metFORMIN (GLUCOPHAGE) 1000 MG tablet Take 1,000 mg by mouth daily with breakfast.     [provider]  methocarbamol (ROBAXIN) 500 MG tablet Take 1 tablet (500 mg total) by mouth 2 (two) times daily. 02/11/20   Faustino Congress, NP  metoprolol tartrate (LOPRESSOR) 50 MG tablet Take 2 tablets (100 mg total) by mouth once for 1 dose. Take 2 hours prior to cardiac CTA. 07/20/19 07/20/19  Richardo Priest, MD  naproxen (NAPROSYN) 500 MG tablet Take 1 tablet (500 mg total) by mouth 2 (two) times daily. 07/09/20   Wieters, Hallie C, PA-C  ondansetron (ZOFRAN ODT) 4 MG disintegrating tablet Take 1 tablet (4 mg total) by mouth every 8 (eight) hours as needed for nausea  or vomiting. 08/16/20   Renold Genta, MD  pseudoephedrine (SUDAFED) 60 MG tablet Take 60 mg by mouth every 4 (four) hours as needed for congestion.    [provider]  rosuvastatin (CRESTOR) 10 MG tablet Take 10 mg by mouth at bedtime. 12/12/19   [provider]  simvastatin (ZOCOR) 40 MG tablet Take 40 mg by mouth every evening. Reported on 03/01/2016    [provider]  sodium chloride (OCEAN) 0.65 % SOLN nasal spray Place 1 spray into both nostrils as needed for congestion. 08/30/18   Law, [name], PA-C  tiZANidine (ZANAFLEX) 4 MG tablet Take 1 tablet (4 mg total) by mouth every 6 (six) hours as needed for muscle spasms. 08/16/20   Renold Genta, MD  traMADol (ULTRAM) 50 MG tablet Take 1 tablet (50 mg total) by mouth every 6 (six) hours as needed. 08/06/20   Vanessa Kick, MD    Allergies    Doxycycline and Flagyl [metronidazole]  Review of Systems   Review of Systems  Constitutional: Positive for chills and fatigue. Negative for fever.  HENT: Negative for facial swelling and voice change.   Eyes: Negative for redness and visual disturbance.  Respiratory: Positive for cough. Negative for shortness of breath.   Cardiovascular: Negative for chest pain and palpitations.  Gastrointestinal: Positive for diarrhea and nausea. Negative for abdominal pain and vomiting.  Genitourinary: Negative for  difficulty urinating and dysuria.  Musculoskeletal: Negative for gait problem and joint swelling.  Skin: Negative for rash and wound.  Neurological: Negative for dizziness and headaches.  Psychiatric/Behavioral: Negative for confusion and suicidal ideas.    Physical Exam Updated Vital Signs BP 121/85   Pulse 90   Temp 98.6 F (37 C)   Resp 16   Ht 5\' 8"  (1.727 m)   Wt 106.6 kg   SpO2 (!) 2%   BMI 35.73 kg/m   Physical Exam Constitutional:      General: She is not in acute distress.    Appearance: She is obese.  HENT:     Head: Normocephalic and atraumatic.     Mouth/Throat:     Mouth: Mucous membranes are moist.     Pharynx: Oropharynx is clear.  Eyes:     General: No scleral icterus.    Pupils: Pupils are equal, round, and reactive to light.  Cardiovascular:     Rate and Rhythm: Normal rate and regular rhythm.     Pulses: Normal pulses.  Pulmonary:     Effort: Pulmonary effort is normal. No respiratory distress.  Abdominal:     General: There is no distension.     Tenderness: There is no abdominal tenderness.  Musculoskeletal:        General: No tenderness or deformity.     Cervical back: Normal range of motion and neck supple.  Neurological:     General: No focal deficit present.     Mental Status: She is alert and oriented to person, place, and time.  Psychiatric:        Mood and Affect: Mood normal.        Behavior: Behavior normal.     ED Results / Procedures / Treatments   Labs (all labs ordered are listed, but only abnormal results are displayed) Labs Reviewed  COMPREHENSIVE METABOLIC PANEL - Abnormal; Notable for the following components:      Result Value   Glucose, Bld 316 (*)    AST 14 (*)    All other components within normal limits  URINALYSIS, ROUTINE W REFLEX MICROSCOPIC - Abnormal; Notable for the following components:   APPearance HAZY (*)    Glucose, UA >=500 (*)    Ketones, ur 5 (*)    Protein, ur 30 (*)    Bacteria, UA RARE (*)     All other components within normal limits  SARS CORONAVIRUS 2 BY RT PCR (HOSPITAL ORDER, [facility])  LIPASE, BLOOD  CBC  I-STAT BETA HCG BLOOD, ED ([facility], [facility], [facility] ONLY)    EKG None  Radiology No results found.  Procedures Procedures (including critical care time)  Medications Ordered in ED Medications  ondansetron (ZOFRAN) injection 4 mg (4 mg Intravenous Not Given 08/16/20 2242)  lactated ringers bolus 1,000 mL (1,000 mLs Intravenous Not Given 08/16/20 2242)  ketorolac (TORADOL) 15 MG/ML injection 15 mg (15 mg Intravenous Not Given 08/16/20 2243)  cyclobenzaprine (FLEXERIL) tablet 5 mg (5 mg Oral Given 08/16/20 2303)    ED Course  I have reviewed the triage vital signs and the nursing notes.  Pertinent labs & imaging results that were available during my care of the patient were reviewed by me and considered in my medical decision making (see chart for details).    MDM Rules/Calculators/A&P                          Differential diagnosis considered: COVID-19, flu, viral URI, pneumonia, GERD  Patient presents to ED with flulike illness over 2 weeks in duration, endorses an interval improvement prior to worsening which could represent 2 individual infections.  Notably her vital signs are triage work-up reviewed and unremarkable including a normal chest x-ray making pneumonia unlikely..  Glucose elevated in the low 300s but per patient she normally runs 2-300 and I have a very low suspicion for HHS or DKA in the type II patient without a history of either at this glucose level.  Patient saturating high 90s to 100 even with ambulation, will not require admission for oxygen without any appreciable wheeze.  I offered the patient Toradol, Flexeril, Zofran for symptoms right now as well as fluids which should improve glucose as well, she however expresses a strong desire to leave.  Will prescribe Flexeril and Zofran to go home with.  Strict return precautions  stressed and patient counseled to follow-up with PCP in a few days if symptoms not improving.  Labs reviewed and interpreted by myself with significant findings above. Imaging reviewed by myself and interpreted by radiologist.  Case and plan above discussed with my attending Dr. Maryan Rued   Final Clinical Impression(s) / ED Diagnoses Final diagnoses:  Flu-like symptoms    Rx / DC Orders ED Discharge Orders         Ordered    ondansetron (ZOFRAN ODT) 4 MG disintegrating tablet  Every 8 hours PRN        08/16/20 2240    tiZANidine (ZANAFLEX) 4 MG tablet  Every 6 hours PRN        08/16/20 2240         Labs, studies and imaging reviewed by myself and considered in medical decision making if ordered. Imaging interpreted by radiology. Pt was discussed with my attending, Dr. Maryan Rued  Electronically signed by:  Roderic Palau [place]9/9/202111:20 PM       Renold Genta, MD 08/16/20 [id number]    Blanchie Dessert, MD 08/16/20 2321

## 2020-08-16 NOTE — ED Notes (Signed)
Attempted to call pt to room assignment, no answer

## 2020-08-16 NOTE — ED Notes (Signed)
Reviewed discharge instructions with patient. Follow-up care and medications reviewed. Patient  verbalized understanding. Patient A&Ox4, VSS, and ambulatory with steady gait upon discharge.  °

## 2020-08-16 NOTE — ED Triage Notes (Signed)
Pt c/ generalized body aches, fevers, chills, nausea and abd pain for the past 3 days.

## 2020-10-22 DIAGNOSIS — M1990 Unspecified osteoarthritis, unspecified site: Secondary | ICD-10-CM | POA: Diagnosis not present

## 2020-10-22 DIAGNOSIS — E781 Pure hyperglyceridemia: Secondary | ICD-10-CM | POA: Diagnosis not present

## 2020-10-22 DIAGNOSIS — E1165 Type 2 diabetes mellitus with hyperglycemia: Secondary | ICD-10-CM | POA: Diagnosis not present

## 2020-11-08 ENCOUNTER — Emergency Department (HOSPITAL_COMMUNITY)
Admission: EM | Admit: 2020-11-08 | Discharge: 2020-11-08 | Disposition: A | Discharge status: Home / Self Care | Payer: Medicaid Other | Attending: Emergency Medicine | Admitting: Emergency Medicine

## 2020-11-08 ENCOUNTER — Encounter (HOSPITAL_COMMUNITY): Payer: Self-pay

## 2020-11-08 DIAGNOSIS — Z7982 Long term (current) use of aspirin: Secondary | ICD-10-CM | POA: Diagnosis not present

## 2020-11-08 DIAGNOSIS — F17210 Nicotine dependence, cigarettes, uncomplicated: Secondary | ICD-10-CM | POA: Insufficient documentation

## 2020-11-08 DIAGNOSIS — G4489 Other headache syndrome: Secondary | ICD-10-CM | POA: Diagnosis not present

## 2020-11-08 DIAGNOSIS — Z79899 Other long term (current) drug therapy: Secondary | ICD-10-CM | POA: Diagnosis not present

## 2020-11-08 DIAGNOSIS — S4991XA Unspecified injury of right shoulder and upper arm, initial encounter: Secondary | ICD-10-CM | POA: Diagnosis present

## 2020-11-08 DIAGNOSIS — Z7984 Long term (current) use of oral hypoglycemic drugs: Secondary | ICD-10-CM | POA: Diagnosis not present

## 2020-11-08 DIAGNOSIS — S46811A Strain of other muscles, fascia and tendons at shoulder and upper arm level, right arm, initial encounter: Secondary | ICD-10-CM | POA: Diagnosis not present

## 2020-11-08 DIAGNOSIS — Z794 Long term (current) use of insulin: Secondary | ICD-10-CM | POA: Insufficient documentation

## 2020-11-08 DIAGNOSIS — X58XXXA Exposure to other specified factors, initial encounter: Secondary | ICD-10-CM | POA: Insufficient documentation

## 2020-11-08 DIAGNOSIS — E119 Type 2 diabetes mellitus without complications: Secondary | ICD-10-CM | POA: Insufficient documentation

## 2020-11-08 DIAGNOSIS — R519 Headache, unspecified: Secondary | ICD-10-CM | POA: Diagnosis not present

## 2020-11-08 DIAGNOSIS — I10 Essential (primary) hypertension: Secondary | ICD-10-CM | POA: Insufficient documentation

## 2020-11-08 DIAGNOSIS — R52 Pain, unspecified: Secondary | ICD-10-CM | POA: Diagnosis not present

## 2020-11-08 LAB — CBC
HCT: 39.9 % (ref 36.0–46.0)
Hemoglobin: 12.8 g/dL (ref 12.0–15.0)
MCH: 27.8 pg (ref 26.0–34.0)
MCHC: 32.1 g/dL (ref 30.0–36.0)
MCV: 86.7 fL (ref 80.0–100.0)
Platelets: 341 10*3/uL (ref 150–400)
RBC: 4.6 MIL/uL (ref 3.87–5.11)
RDW: 12.6 % (ref 11.5–15.5)
WBC: 7.9 10*3/uL (ref 4.0–10.5)
nRBC: 0 % (ref 0.0–0.2)

## 2020-11-08 LAB — BASIC METABOLIC PANEL
Anion gap: 15 (ref 5–15)
BUN: 9 mg/dL (ref 6–20)
CO2: 24 mmol/L (ref 22–32)
Calcium: 9.9 mg/dL (ref 8.9–10.3)
Chloride: 99 mmol/L (ref 98–111)
Creatinine, Ser: 0.81 mg/dL (ref 0.44–1.00)
GFR, Estimated: >60 mL/min (ref >60)
Glucose, Bld: 226 mg/dL — ABNORMAL HIGH (ref 70–99)
Potassium: 3.8 mmol/L (ref 3.5–5.1)
Sodium: 138 mmol/L (ref 135–145)

## 2020-11-08 LAB — URINALYSIS, ROUTINE W REFLEX MICROSCOPIC
Bilirubin Urine: NEGATIVE
Glucose, UA: 50 mg/dL — AB
Hgb urine dipstick: NEGATIVE
Ketones, ur: NEGATIVE mg/dL
Leukocytes,Ua: NEGATIVE
Nitrite: NEGATIVE
Protein, ur: NEGATIVE mg/dL
Specific Gravity, Urine: 1.019 (ref 1.005–1.030)
pH: 5 (ref 5.0–8.0)

## 2020-11-08 LAB — I-STAT BETA HCG BLOOD, ED (MC, WL, AP ONLY): I-stat hCG, quantitative: <5 m[IU]/mL (ref <5)

## 2020-11-08 MED ORDER — DIAZEPAM 2 MG PO TABS
2.0000 mg | ORAL_TABLET | Freq: Once | ORAL | Status: AC
  Administered 2020-11-08: 2 mg via ORAL
  Filled 2020-11-08: qty 1

## 2020-11-08 MED ORDER — ACETAMINOPHEN 500 MG PO TABS
1000.0000 mg | ORAL_TABLET | Freq: Once | ORAL | Status: AC
  Administered 2020-11-08: 1000 mg via ORAL
  Filled 2020-11-08: qty 2

## 2020-11-08 MED ORDER — KETOROLAC TROMETHAMINE 60 MG/2ML IM SOLN
15.0000 mg | Freq: Once | INTRAMUSCULAR | Status: AC
  Administered 2020-11-08: 15 mg via INTRAMUSCULAR
  Filled 2020-11-08: qty 2

## 2020-11-08 NOTE — ED Triage Notes (Signed)
Pt comes from home via [facility] for headache for one week, some dizziness, neuro intact bilaterally.

## 2020-11-08 NOTE — ED Notes (Signed)
Pt discharge instructions reviewed with the patient. The patient verbalized understanding of instructions. Pt discharged. 

## 2020-11-08 NOTE — ED Provider Notes (Signed)
[facility] EMERGENCY DEPARTMENT Provider Note   CSN: [id number] Arrival date & time: 11/08/20  3149     History Chief Complaint  Patient presents with  . Headache    Ellen Avery is a 54 y.o. female.  54 yo F with a chief complaints of a right-sided headache.  Is been going on for about a week.  Originates in the back of the head and then goes to the front.  Nothing seems to make it better or worse.  She denies trauma to the area denies one-sided numbness or weakness denies difficulty with speech or swallowing.  Denies fevers.  Has chronic congestion that she relates to sinuses.  Had seen her family doctor couple weeks ago for that and they had changed her antihistamine.  Has a history of headaches and this feels somewhat similar.  Has a remote history of trauma to her neck that she says bothers her from time to time.  The history is provided by the patient.  Illness Severity:  Moderate Onset quality:  Gradual Duration:  1 week Timing:  Constant Progression:  Worsening Chronicity:  New Associated symptoms: no chest pain, no congestion, no fever, no headaches, no myalgias, no nausea, no rhinorrhea, no shortness of breath, no vomiting and no wheezing        Past Medical History:  Diagnosis Date  . Anxiety   . Arthritis   . Chronic headaches   . DDD (degenerative disc disease), cervical   . Fibromyalgia   . GERD (gastroesophageal reflux disease)   . History of colon polyps   . History of psychosis   . Hypercholesteremia   . Hypertension   . IBS (irritable bowel syndrome)   . MDD (major depressive disorder)   . Right sided facial pain   . Seasonal allergic rhinitis   . Sinus congestion    12-24-2018 per pt no fever  . Type 2 diabetes mellitus treated with insulin ([place])   . Vitamin D deficiency   . Wears dentures    full upper,  partial lower  . Wears glasses     Patient Active Problem List   Diagnosis Date Noted  . Chest pain in adult  07/20/2019  . IDDM (insulin dependent diabetes mellitus) 07/08/2018  . HTN (hypertension) 07/08/2018  . HLD (hyperlipidemia) 07/08/2018  . Altered mental status 03/02/2016  . MDD (major depressive disorder), recurrent episode, moderate ([place]) 03/02/2016  . Psychoses [facility])     Past Surgical History:  Procedure Laterality Date  . ARTERY BIOPSY Right 12/28/2018   Procedure: RIGHT BIOPSY TEMPORAL ARTERY;  Surgeon: Kieth Brightly, Arta Bruce, MD;  Location: [facility];  Service: General;  Laterality: Right;  . CARDIAC CATHETERIZATION    . MULTIPLE TOOTH EXTRACTIONS  04/ 2019    sedation  . TOTAL ABDOMINAL HYSTERECTOMY W/ BILATERAL SALPINGOOPHORECTOMY  2009     OB History   No obstetric history on file.     Family History  Problem Relation Age of Onset  . Diabetes Mother   . Heart disease Paternal Grandmother   . Breast cancer Paternal Grandmother   . Diabetes Paternal Uncle   . Heart Problems Brother   . Breast cancer Maternal Aunt   . Head & neck cancer Cousin   . Cervical cancer Paternal Aunt   . Colon cancer Neg Hx   . Colon polyps Neg Hx   . Esophageal cancer Neg Hx   . Rectal cancer Neg Hx   . Stomach cancer Neg  Hx     Social History   Tobacco Use  . Smoking status: Current Every Day Smoker    Packs/day: 1.00    Years: 35.00    Pack years: 35.00    Types: Cigarettes  . Smokeless tobacco: Never Used  Vaping Use  . Vaping Use: Never used  Substance Use Topics  . Alcohol use: No  . Drug use: No    Home Medications Prior to Admission medications   Medication Sig Start Date End Date Taking? Authorizing Provider  ACCU-CHEK AVIVA PLUS test strip USE TO CHECK BLOOD SUGAR 4 TIMES DAILY 05/25/18   [provider]  albuterol (VENTOLIN HFA) 108 (90 Base) MCG/ACT inhaler Inhale 1-2 puffs into the lungs every 6 (six) hours as needed for wheezing or shortness of breath. 01/10/20   Wieters, Hallie C, PA-C  aspirin EC 81 MG tablet Take 81 mg by mouth daily.     [provider]  azelastine (ASTELIN) 0.1 % nasal spray Place 2 sprays into both nostrils 2 (two) times daily. 04/16/20   Tasia Catchings, Amy V, PA-C  cetirizine (ZYRTEC) 10 MG tablet Take 10 mg by mouth daily.    [provider]  cetirizine-pseudoephedrine (ZYRTEC-D) 5-120 MG tablet Take 1 tablet by mouth daily. 10/25/19   Avegno, [name], FNP  Cholecalciferol (VITAMIN D3) 3000 units TABS Take 3,000 Units by mouth daily.    [provider]  dextromethorphan-guaiFENesin (MUCINEX DM) 30-600 MG 12hr tablet Take 1 tablet by mouth 2 (two) times daily. 01/10/20   Wieters, Hallie C, PA-C  DULoxetine (CYMBALTA) 60 MG capsule Take 1 capsule (60 mg total) by mouth daily. 04/03/20   Sharion Balloon, NP  Famotidine (PEPCID AC PO) Take by mouth as needed.    [provider]  fluticasone (FLONASE) 50 MCG/ACT nasal spray Place 1 spray into both nostrils daily. 04/03/20   Sharion Balloon, NP  gabapentin (NEURONTIN) 100 MG capsule Take 1-2 capsules (100-200 mg total) by mouth See admin instructions. Take 100 mg in the morning, 100 mg at lunch, and 200 mg in the evening 04/03/20   Sharion Balloon, NP  hydrOXYzine (ATARAX/VISTARIL) 25 MG tablet Take 1 tablet (25 mg total) by mouth every 6 (six) hours as needed for anxiety. 09/13/18   [name], [name], MD  insulin glargine (LANTUS) 100 UNIT/ML injection Inject 50 Units into the skin at bedtime.     [provider]  JANUVIA 50 MG tablet Take 50 mg by mouth daily. 07/07/19   [provider]  LANTUS SOLOSTAR 100 UNIT/ML Solostar Pen SMARTSIG:55 Unit(s) SUB-Q Every Night 12/12/19   [provider]  losartan-hydrochlorothiazide (HYZAAR) 100-12.5 MG tablet Take 1 tablet by mouth daily.  11/01/18   [provider]  magnesium hydroxide (MILK OF MAGNESIA) 400 MG/5ML suspension Take by mouth daily as needed for mild constipation.    [provider]  metFORMIN (GLUCOPHAGE) 1000 MG tablet Take 1,000 mg by mouth daily with  breakfast.     [provider]  methocarbamol (ROBAXIN) 500 MG tablet Take 1 tablet (500 mg total) by mouth 2 (two) times daily. 02/11/20   Faustino Congress, NP  metoprolol tartrate (LOPRESSOR) 50 MG tablet Take 2 tablets (100 mg total) by mouth once for 1 dose. Take 2 hours prior to cardiac CTA. 07/20/19 07/20/19  Richardo Priest, MD  naproxen (NAPROSYN) 500 MG tablet Take 1 tablet (500 mg total) by mouth 2 (two) times daily. 07/09/20   Wieters, Hallie C, PA-C  ondansetron (  ZOFRAN ODT) 4 MG disintegrating tablet Take 1 tablet (4 mg total) by mouth every 8 (eight) hours as needed for nausea or vomiting. 08/16/20   Renold Genta, MD  pseudoephedrine (SUDAFED) 60 MG tablet Take 60 mg by mouth every 4 (four) hours as needed for congestion.    [provider]  rosuvastatin (CRESTOR) 10 MG tablet Take 10 mg by mouth at bedtime. 12/12/19   [provider]  simvastatin (ZOCOR) 40 MG tablet Take 40 mg by mouth every evening. Reported on 03/01/2016    [provider]  sodium chloride (OCEAN) 0.65 % SOLN nasal spray Place 1 spray into both nostrils as needed for congestion. 08/30/18   Law, [name], PA-C  tiZANidine (ZANAFLEX) 4 MG tablet Take 1 tablet (4 mg total) by mouth every 6 (six) hours as needed for muscle spasms. 08/16/20   Renold Genta, MD  traMADol (ULTRAM) 50 MG tablet Take 1 tablet (50 mg total) by mouth every 6 (six) hours as needed. 08/06/20   Vanessa Kick, MD    Allergies    Doxycycline and Flagyl [metronidazole]  Review of Systems   Review of Systems  Constitutional: Negative for chills and fever.  HENT: Negative for congestion and rhinorrhea.   Eyes: Negative for redness and visual disturbance.  Respiratory: Negative for shortness of breath and wheezing.   Cardiovascular: Negative for chest pain and palpitations.  Gastrointestinal: Negative for nausea and vomiting.  Genitourinary: Negative for dysuria and urgency.  Musculoskeletal: Positive for  arthralgias. Negative for myalgias.  Skin: Negative for pallor and wound.  Neurological: Negative for dizziness and headaches.    Physical Exam Updated Vital Signs BP 125/85 (BP Location: Right Arm)   Pulse 82   Temp 98 F (36.7 C) (Oral)   Resp 18   SpO2 97%   Physical Exam Vitals and nursing note reviewed.  Constitutional:      General: She is not in acute distress.    Appearance: She is well-developed. She is not diaphoretic.  HENT:     Head: Normocephalic and atraumatic.  Eyes:     Pupils: Pupils are equal, round, and reactive to light.  Cardiovascular:     Rate and Rhythm: Normal rate and regular rhythm.     Heart sounds: No murmur heard.  No friction rub. No gallop.   Pulmonary:     Effort: Pulmonary effort is normal.     Breath sounds: No wheezing or rales.  Abdominal:     General: There is no distension.     Palpations: Abdomen is soft.     Tenderness: There is no abdominal tenderness.  Musculoskeletal:        General: No tenderness.     Cervical back: Normal range of motion and neck supple.     Comments: Pain on palpation of the right trapezius and at the attachment to the skull reproduces the patient's headache.  Pulse motor and sensation are intact to the right upper extremity.  Skin:    General: Skin is warm and dry.  Neurological:     Mental Status: She is alert and oriented to person, place, and time.     GCS: GCS eye subscore is 4. GCS verbal subscore is 5. GCS motor subscore is 6.     Cranial Nerves: Cranial nerves are intact.     Sensory: Sensation is intact.     Motor: Motor function is intact.     Coordination: Coordination is intact.     Gait: Gait is intact.  Comments: Benign neurologic exam, patient is able to ambulate without difficulty.  Psychiatric:        Behavior: Behavior normal.     ED Results / Procedures / Treatments   Labs (all labs ordered are listed, but only abnormal results are displayed) Labs Reviewed  BASIC METABOLIC  PANEL - Abnormal; Notable for the following components:      Result Value   Glucose, Bld 226 (*)    All other components within normal limits  URINALYSIS, ROUTINE W REFLEX MICROSCOPIC - Abnormal; Notable for the following components:   Glucose, UA 50 (*)    All other components within normal limits  CBC  I-STAT BETA HCG BLOOD, ED ([facility], [facility], [facility] ONLY)  CBG MONITORING, ED    EKG EKG Interpretation  Date/Time:  Thursday November 08 2020 03:51:04 EST Ventricular Rate:  77 PR Interval:  136 QRS Duration: 82 QT Interval:  388 QTC Calculation: 439 R Axis:   73 Text Interpretation: Normal sinus rhythm Nonspecific T wave abnormality Abnormal ECG No significant change since last tracing Confirmed by Deno Etienne [phone]) on 11/08/2020 8:51:14 AM   Radiology No results found.  Procedures Procedures (including critical care time)  Medications Ordered in ED Medications  acetaminophen (TYLENOL) tablet 1,000 mg (has no administration in time range)  ketorolac (TORADOL) injection 15 mg (has no administration in time range)  diazepam (VALIUM) tablet 2 mg (has no administration in time range)    ED Course  I have reviewed the triage vital signs and the nursing notes.  Pertinent labs & imaging results that were available during my care of the patient were reviewed by me and considered in my medical decision making (see chart for details).    MDM Rules/Calculators/A&P                          54 yo F with a chief complaints of a right-sided headache.  Feels like her prior headaches slow in onset going on for the past week.  She does not feel like anything makes it better or worse but it is reproduced easily on exam with motion of the right arm and palpation of the right trapezius.  Likely the patient has a trapezius strain.  Will treat as such.  She is concerned about sinusitis this seems less likely to be the diagnosis.  We will have her follow-up with her family doctor in the office.  Tylenol  and NSAIDs.  9:13 AM:  I have discussed the diagnosis/risks/treatment options with the patient and believe the pt to be eligible for discharge home to follow-up with PCP. We also discussed returning to the ED immediately if new or worsening sx occur. We discussed the sx which are most concerning (e.g., sudden worsening pain, fever, inability to tolerate by mouth) that necessitate immediate return. Medications administered to the patient during their visit and any new prescriptions provided to the patient are listed below.  Medications given during this visit Medications  acetaminophen (TYLENOL) tablet 1,000 mg (has no administration in time range)  ketorolac (TORADOL) injection 15 mg (has no administration in time range)  diazepam (VALIUM) tablet 2 mg (has no administration in time range)     The patient appears reasonably screen and/or stabilized for discharge and I doubt any other medical condition or other [facility] requiring further screening, evaluation, or treatment in the ED at this time prior to discharge.   Final Clinical Impression(s) / ED Diagnoses Final diagnoses:  Trapezius  strain, right, initial encounter    Rx / DC Orders ED Discharge Orders    None       Deno Etienne, [place] 11/08/20 [phone]

## 2020-11-20 DIAGNOSIS — M5412 Radiculopathy, cervical region: Secondary | ICD-10-CM | POA: Diagnosis not present

## 2020-12-06 ENCOUNTER — Other Ambulatory Visit: Payer: Self-pay

## 2020-12-06 ENCOUNTER — Encounter (HOSPITAL_COMMUNITY): Payer: Self-pay

## 2020-12-06 ENCOUNTER — Ambulatory Visit (HOSPITAL_COMMUNITY)
Admission: EM | Admit: 2020-12-06 | Discharge: 2020-12-06 | Disposition: A | Discharge status: Home / Self Care | Payer: Medicaid Other | Attending: Family Medicine | Admitting: Family Medicine

## 2020-12-06 DIAGNOSIS — J0100 Acute maxillary sinusitis, unspecified: Secondary | ICD-10-CM

## 2020-12-06 DIAGNOSIS — J3089 Other allergic rhinitis: Secondary | ICD-10-CM | POA: Diagnosis not present

## 2020-12-06 DIAGNOSIS — M542 Cervicalgia: Secondary | ICD-10-CM

## 2020-12-06 MED ORDER — DM-GUAIFENESIN ER 30-600 MG PO TB12: 1.0000 | ORAL_TABLET | Freq: Two times a day (BID) | ORAL | 0 refills | Status: DC

## 2020-12-06 MED ORDER — AMOXICILLIN-POT CLAVULANATE 875-125 MG PO TABS: 1.0000 | ORAL_TABLET | Freq: Two times a day (BID) | ORAL | 0 refills | Status: DC

## 2020-12-06 NOTE — ED Triage Notes (Signed)
Pt in with c/o cough, congestion, subjective fever and neck pain that has been going on for about 10 days now.  pt states her ears feel clogged up and a scratchy throat

## 2020-12-06 NOTE — Discharge Instructions (Addendum)
Take the antibiotic 2 x a day Take the mucinex DM 2 x a day Continue zyrtec and flonase per usual Take ibuprofen or aleve for neck pain

## 2020-12-06 NOTE — ED Provider Notes (Signed)
[facility]    CSN: [id number] Arrival date & time: 12/06/20  1053      History   Chief Complaint Chief Complaint  Patient presents with  . Nasal Congestion  . Neck Pain    HPI Ellen Avery is a 54 y.o. female.   HPI   She has a respiratory infection.  Cough and congestion, fever and neck pain about 10 days.  Her ears are clogged, sinuses are clogged, mild runny nose.  Postnasal drip.  The sinus drainage down the back of the throat is thick and sometimes yellow.  She states that she has not been able to clear this after 10 days.  Is taking over-the-counter medications without luck.  Has not been exposed to strep or Covid.  Has not had her Covid vaccinations  Past Medical History:  Diagnosis Date  . Anxiety   . Arthritis   . Chronic headaches   . DDD (degenerative disc disease), cervical   . Fibromyalgia   . GERD (gastroesophageal reflux disease)   . History of colon polyps   . History of psychosis   . Hypercholesteremia   . Hypertension   . IBS (irritable bowel syndrome)   . MDD (major depressive disorder)   . Right sided facial pain   . Seasonal allergic rhinitis   . Sinus congestion    12-24-2018 per pt no fever  . Type 2 diabetes mellitus treated with insulin ([place])   . Vitamin D deficiency   . Wears dentures    full upper,  partial lower  . Wears glasses     Patient Active Problem List   Diagnosis Date Noted  . Chest pain in adult 07/20/2019  . IDDM (insulin dependent diabetes mellitus) 07/08/2018  . HTN (hypertension) 07/08/2018  . HLD (hyperlipidemia) 07/08/2018  . Altered mental status 03/02/2016  . MDD (major depressive disorder), recurrent episode, moderate ([place]) 03/02/2016  . Psychoses [facility])     Past Surgical History:  Procedure Laterality Date  . ARTERY BIOPSY Right 12/28/2018   Procedure: RIGHT BIOPSY TEMPORAL ARTERY;  Surgeon: Kieth Brightly, Arta Bruce, MD;  Location: [place];  Service: General;  Laterality: Right;   . CARDIAC CATHETERIZATION    . MULTIPLE TOOTH EXTRACTIONS  04/ 2019    sedation  . TOTAL ABDOMINAL HYSTERECTOMY W/ BILATERAL SALPINGOOPHORECTOMY  2009    OB History   No obstetric history on file.      Home Medications    Prior to Admission medications   Medication Sig Start Date End Date Taking? Authorizing Provider  amoxicillin-clavulanate (AUGMENTIN) 875-125 MG tablet Take 1 tablet by mouth every 12 (twelve) hours. 12/06/20  Yes Raylene Everts, MD  dextromethorphan-guaiFENesin [facility] DM) 30-600 MG 12hr tablet Take 1 tablet by mouth 2 (two) times daily. 12/06/20  Yes Raylene Everts, MD  ACCU-CHEK AVIVA PLUS test strip USE TO CHECK BLOOD SUGAR 4 TIMES DAILY 05/25/18   [provider]  albuterol (VENTOLIN HFA) 108 (90 Base) MCG/ACT inhaler Inhale 1-2 puffs into the lungs every 6 (six) hours as needed for wheezing or shortness of breath. 01/10/20   Wieters, Hallie C, PA-C  aspirin EC 81 MG tablet Take 81 mg by mouth daily.    [provider]  azelastine (ASTELIN) 0.1 % nasal spray Place 2 sprays into both nostrils 2 (two) times daily. 04/16/20   Tasia Catchings, Amy V, PA-C  cetirizine (ZYRTEC) 10 MG tablet Take 10 mg by mouth daily.    [provider]  cetirizine-pseudoephedrine (  ZYRTEC-D) 5-120 MG tablet Take 1 tablet by mouth daily. 10/25/19   Avegno, Zachery Dakins, FNP  Cholecalciferol (VITAMIN D3) 3000 units TABS Take 3,000 Units by mouth daily.    [provider]  DULoxetine (CYMBALTA) 60 MG capsule Take 1 capsule (60 mg total) by mouth daily. 04/03/20   Mickie Bail, NP  Famotidine (PEPCID AC PO) Take by mouth as needed.    [provider]  fluticasone (FLONASE) 50 MCG/ACT nasal spray Place 1 spray into both nostrils daily. 04/03/20   Mickie Bail, NP  gabapentin (NEURONTIN) 100 MG capsule Take 1-2 capsules (100-200 mg total) by mouth See admin instructions. Take 100 mg in the morning, 100 mg at lunch, and 200 mg in the evening 04/03/20   Mickie Bail, NP  hydrOXYzine (ATARAX/VISTARIL) 25 MG tablet Take 1 tablet (25 mg total) by mouth every 6 (six) hours as needed for anxiety. 09/13/18   [name], Mayer Masker, MD  insulin glargine (LANTUS) 100 UNIT/ML injection Inject 50 Units into the skin at bedtime.     [provider]  JANUVIA 50 MG tablet Take 50 mg by mouth daily. 07/07/19   [provider]  LANTUS SOLOSTAR 100 UNIT/ML Solostar Pen SMARTSIG:55 Unit(s) SUB-Q Every Night 12/12/19   [provider]  losartan-hydrochlorothiazide (HYZAAR) 100-12.5 MG tablet Take 1 tablet by mouth daily.  11/01/18   [provider]  magnesium hydroxide (MILK OF MAGNESIA) 400 MG/5ML suspension Take by mouth daily as needed for mild constipation.    [provider]  metFORMIN (GLUCOPHAGE) 1000 MG tablet Take 1,000 mg by mouth daily with breakfast.     [provider]  methocarbamol (ROBAXIN) 500 MG tablet Take 1 tablet (500 mg total) by mouth 2 (two) times daily. 02/11/20   Moshe Cipro, NP  metoprolol tartrate (LOPRESSOR) 50 MG tablet Take 2 tablets (100 mg total) by mouth once for 1 dose. Take 2 hours prior to cardiac CTA. 07/20/19 07/20/19  Baldo Daub, MD  naproxen (NAPROSYN) 500 MG tablet Take 1 tablet (500 mg total) by mouth 2 (two) times daily. 07/09/20   Wieters, Hallie C, PA-C  ondansetron (ZOFRAN ODT) 4 MG disintegrating tablet Take 1 tablet (4 mg total) by mouth every 8 (eight) hours as needed for nausea or vomiting. 08/16/20   Loree Fee, MD  pseudoephedrine (SUDAFED) 60 MG tablet Take 60 mg by mouth every 4 (four) hours as needed for congestion.    [provider]  rosuvastatin (CRESTOR) 10 MG tablet Take 10 mg by mouth at bedtime. 12/12/19   [provider]  simvastatin (ZOCOR) 40 MG tablet Take 40 mg by mouth every evening. Reported on 03/01/2016    [provider]  sodium chloride (OCEAN) 0.65 % SOLN nasal spray Place 1 spray into both nostrils as needed for  congestion. 08/30/18   Law, Waylan Boga, PA-C  tiZANidine (ZANAFLEX) 4 MG tablet Take 1 tablet (4 mg total) by mouth every 6 (six) hours as needed for muscle spasms. 08/16/20   Loree Fee, MD  traMADol (ULTRAM) 50 MG tablet Take 1 tablet (50 mg total) by mouth every 6 (six) hours as needed. 08/06/20   Mardella Layman, MD    Family History Family History  Problem Relation Age of Onset  . Diabetes Mother   . Heart disease Paternal Grandmother   . Breast cancer Paternal Grandmother   . Diabetes Paternal Uncle   . Heart Problems Brother   . Breast cancer Maternal Aunt   .  Head & neck cancer Cousin   . Cervical cancer Paternal Aunt   . Colon cancer Neg Hx   . Colon polyps Neg Hx   . Esophageal cancer Neg Hx   . Rectal cancer Neg Hx   . Stomach cancer Neg Hx     Social History Social History   Tobacco Use  . Smoking status: Current Every Day Smoker    Packs/day: 1.00    Years: 35.00    Pack years: 35.00    Types: Cigarettes  . Smokeless tobacco: Never Used  Vaping Use  . Vaping Use: Never used  Substance Use Topics  . Alcohol use: No  . Drug use: No     Allergies   Doxycycline and Flagyl [metronidazole]   Review of Systems Review of Systems See HPI  Physical Exam Triage Vital Signs ED Triage Vitals  Enc Vitals Group     BP 12/06/20 1251 128/87     Pulse Rate 12/06/20 1251 81     Resp 12/06/20 1254 20     Temp 12/06/20 1254 97.7 F (36.5 C)     Temp Source 12/06/20 1254 Temporal     SpO2 12/06/20 1251 97 %     Weight --      Height --      Head Circumference --      Peak Flow --      Pain Score 12/06/20 1244 6     Pain Loc --      Pain Edu? --      Excl. in [place]? --    No data found.  Updated Vital Signs BP 128/87 (BP Location: Right Arm)   Pulse 81   Temp 97.7 F (36.5 C) (Temporal)   Resp 20   SpO2 97%      Physical Exam Constitutional:      General: She is not in acute distress.    Appearance: She is well-developed and well-nourished.   HENT:     Head: Normocephalic and atraumatic.     Right Ear: Tympanic membrane and ear canal normal.     Left Ear: Tympanic membrane and ear canal normal.     Mouth/Throat:     Mouth: Oropharynx is clear and moist.     Comments: Mask is in place oropharynx benign.  Nasal membranes swollen. Eyes:     Conjunctiva/sclera: Conjunctivae normal.     Pupils: Pupils are equal, round, and reactive to light.  Neck:     Comments: Mild tenderness bilaterally in the upper body of the trapezius right greater than left Cardiovascular:     Rate and Rhythm: Normal rate.  Pulmonary:     Effort: Pulmonary effort is normal. No respiratory distress.  Abdominal:     General: There is no distension.     Palpations: Abdomen is soft.  Musculoskeletal:        General: No edema. Normal range of motion.     Cervical back: Normal range of motion.  Skin:    General: Skin is warm and dry.  Neurological:     Mental Status: She is alert.      UC Treatments / Results  Labs (all labs ordered are listed, but only abnormal results are displayed) Labs Reviewed - No data to display  EKG   Radiology No results found.  Procedures Procedures (including critical care time)  Medications Ordered in UC Medications - No data to display  Initial Impression / Assessment and Plan / UC Course  I have reviewed  the triage vital signs and the nursing notes.  Pertinent labs & imaging results that were available during my care of the patient were reviewed by me and considered in my medical decision making (see chart for details).     After 10 days of infection I believe she needs antibiotics.  Follow-up with PCP if not better by next week Final Clinical Impressions(s) / UC Diagnoses   Final diagnoses:  Environmental and seasonal allergies  Acute non-recurrent maxillary sinusitis  Neck pain     Discharge Instructions     Take the antibiotic 2 x a day Take the mucinex DM 2 x a day Continue zyrtec and  flonase per usual Take ibuprofen or aleve for neck pain    ED Prescriptions    Medication Sig Dispense Auth. Provider   amoxicillin-clavulanate (AUGMENTIN) 875-125 MG tablet Take 1 tablet by mouth every 12 (twelve) hours. 14 tablet Raylene Everts, MD   dextromethorphan-guaiFENesin [facility] DM) 30-600 MG 12hr tablet Take 1 tablet by mouth 2 (two) times daily. 20 tablet Raylene Everts, MD     PDMP not reviewed this encounter.   Raylene Everts, MD 12/06/20 2117

## 2020-12-21 DIAGNOSIS — M5412 Radiculopathy, cervical region: Secondary | ICD-10-CM | POA: Diagnosis not present

## 2020-12-21 DIAGNOSIS — M7581 Other shoulder lesions, right shoulder: Secondary | ICD-10-CM | POA: Diagnosis not present

## 2021-01-01 DIAGNOSIS — M25511 Pain in right shoulder: Secondary | ICD-10-CM | POA: Diagnosis not present

## 2021-01-01 DIAGNOSIS — M542 Cervicalgia: Secondary | ICD-10-CM | POA: Diagnosis not present

## 2021-01-03 DIAGNOSIS — M5106 Intervertebral disc disorders with myelopathy, lumbar region: Secondary | ICD-10-CM | POA: Diagnosis not present

## 2021-01-06 ENCOUNTER — Encounter: Payer: Self-pay | Admitting: Oncology

## 2021-01-06 ENCOUNTER — Telehealth: Payer: Medicaid Other | Admitting: Oncology

## 2021-01-06 DIAGNOSIS — M542 Cervicalgia: Secondary | ICD-10-CM | POA: Diagnosis not present

## 2021-01-06 MED ORDER — TIZANIDINE HCL 4 MG PO TABS: 4.0000 mg | ORAL_TABLET | Freq: Four times a day (QID) | ORAL | 0 refills | Status: DC | PRN

## 2021-01-06 MED ORDER — LIDOCAINE 4 % EX PTCH: 1.0000 | MEDICATED_PATCH | Freq: Every day | CUTANEOUS | 1 refills | Status: DC

## 2021-01-06 MED ORDER — ACETAMINOPHEN-CODEINE #3 300-30 MG PO TABS
1.0000 | ORAL_TABLET | Freq: Four times a day (QID) | ORAL | 0 refills | Status: DC | PRN
Start: 2021-01-06 — End: 2021-01-08

## 2021-01-06 NOTE — Progress Notes (Signed)
[name], Ellen Avery are scheduled for a virtual visit with your provider today.    Just as we do with appointments in the office, we must obtain your consent to participate.  Your consent will be active for this visit and any virtual visit you may have with one of our providers in the next 365 days.    If you have a MyChart account, I can also send a copy of this consent to you electronically.  All virtual visits are billed to your insurance company just like a traditional visit in the office.  As this is a virtual visit, video technology does not allow for your provider to perform a traditional examination.  This may limit your provider's ability to fully assess your condition.  If your provider identifies any concerns that need to be evaluated in person or the need to arrange testing such as labs, EKG, etc, we will make arrangements to do so.    Although advances in technology are sophisticated, we cannot ensure that it will always work on either your end or our end.  If the connection with a video visit is poor, we may have to switch to a telephone visit.  With either a video or telephone visit, we are not always able to ensure that we have a secure connection.   I need to obtain your verbal consent now.   Are you willing to proceed with your visit today?   Ellen Avery has provided verbal consent on 01/06/2021 for a virtual visit (video or telephone).   Jacquelin Hawking, NP 01/06/2021  5:39 PM  Virtual Visit via Video Note  I connected with Ellen Avery on 01/06/21 at  5:15 PM EST by a video enabled telemedicine application and verified that I am speaking with the correct person using two identifiers.  Location: Patient: Home Provider: Home office   I discussed the limitations of evaluation and management by telemedicine and the availability of in person appointments. The patient expressed understanding and agreed to proceed.  History of Present Illness: Ellen Avery is a 55 year old female with  past medical history significant for hypertension, diabetes, hyperlipidemia and history of major depressive disorder.  She presents via [employer] visit today for neck pain.  Symptoms have been present for greater than 1 month.  She has been followed by orthopedics and was told she had "bulging disks".  She is scheduled to have steroid injections in the upcoming weeks.  She is unclear of the date at this time.  She has trialed oral steroids in the past but unfortunately caused worsening of her blood sugars.  She is currently taking Zanaflex for muscle spasm.  She also is taking Tylenol and ibuprofen.  Symptoms are persistent and nagging in nature.   Observations/Objective: Review of Systems  Musculoskeletal: Positive for myalgias and neck pain.   Physical Exam Constitutional:      Appearance: Normal appearance. She is obese.  Neurological:     Mental Status: She is alert.    Assessment and Plan:  Acute on chronic neck pain: -She is followed by Ortho and self reports appointment with pain clinic for injections in her neck in the near future. -I cannot find any record of this in care everywhere or [place]. -She is currently taking Tylenol and ibuprofen for pain, Zanaflex for muscle spasms without relief. -She is not taking tramadol at this time.   -Can trial Tylenol # 3 to help with pain and RX sent for lidocaine patches.  Prescription  sent to her pharmacy. -Continue Zanaflex for muscle spasms.  Refill sent.   Follow Up Instructions: -Continue current treatment per Ortho and pain clinic. -Have added lidocaine patches and Tylenol 3 for pain control. -Red flag symptoms spoke about in length with patient.  She understands.  I discussed the assessment and treatment plan with the patient. The patient was provided an opportunity to ask questions and all were answered. The patient agreed with the plan and demonstrated an understanding of the instructions.   The patient was advised to call back  or seek an in-person evaluation if the symptoms worsen or if the condition fails to improve as anticipated.  Greater than 50% was spent in counseling and coordination of care with this patient including but not limited to discussion of the relevant topics above (See A&P) including, but not limited to diagnosis and management of acute and chronic medical conditions.   I provided 15 minutes of face-to-face time during this encounter.  Jacquelin Hawking, NP

## 2021-01-07 ENCOUNTER — Other Ambulatory Visit: Payer: Self-pay | Admitting: Sports Medicine

## 2021-01-07 DIAGNOSIS — M542 Cervicalgia: Secondary | ICD-10-CM

## 2021-01-08 ENCOUNTER — Other Ambulatory Visit (HOSPITAL_COMMUNITY): Payer: Self-pay | Admitting: Oncology

## 2021-01-08 MED ORDER — TRAMADOL HCL 50 MG PO TABS: 50.0000 mg | ORAL_TABLET | Freq: Four times a day (QID) | ORAL | 0 refills | Status: DC | PRN

## 2021-01-10 ENCOUNTER — Telehealth: Payer: Medicaid Other | Admitting: Family Medicine

## 2021-01-10 ENCOUNTER — Ambulatory Visit
Admission: RE | Admit: 2021-01-10 | Discharge: 2021-01-10 | Disposition: A | Discharge status: Home / Self Care | Payer: Medicaid Other | Source: Ambulatory Visit | Attending: Sports Medicine | Admitting: Sports Medicine

## 2021-01-10 ENCOUNTER — Other Ambulatory Visit: Payer: Self-pay

## 2021-01-10 DIAGNOSIS — Z7689 Persons encountering health services in other specified circumstances: Secondary | ICD-10-CM

## 2021-01-10 DIAGNOSIS — M47812 Spondylosis without myelopathy or radiculopathy, cervical region: Secondary | ICD-10-CM | POA: Diagnosis not present

## 2021-01-10 DIAGNOSIS — M542 Cervicalgia: Secondary | ICD-10-CM

## 2021-01-10 MED ORDER — IOPAMIDOL (ISOVUE-M 300) INJECTION 61%
1.0000 mL | Freq: Once | INTRAMUSCULAR | Status: AC | PRN
  Administered 2021-01-10: 1 mL via EPIDURAL

## 2021-01-10 MED ORDER — TRIAMCINOLONE ACETONIDE 40 MG/ML IJ SUSP (RADIOLOGY)
60.0000 mg | Freq: Once | INTRAMUSCULAR | Status: AC
  Administered 2021-01-10: 60 mg via EPIDURAL

## 2021-01-10 NOTE — Progress Notes (Signed)
[name] presented via video requesting an appointment for her grandson.  Patient advised to register her grandson as a patient.  A chart will have to be created at that time.   Donia Pounds  APRN, MSN, FNP-C Patient [facility] [address] [place], [place] [address] [phone]

## 2021-01-10 NOTE — Discharge Instructions (Signed)

## 2021-01-27 DIAGNOSIS — I10 Essential (primary) hypertension: Secondary | ICD-10-CM | POA: Diagnosis not present

## 2021-01-27 DIAGNOSIS — W01198A Fall on same level from slipping, tripping and stumbling with subsequent striking against other object, initial encounter: Secondary | ICD-10-CM | POA: Diagnosis not present

## 2021-01-27 DIAGNOSIS — Z7982 Long term (current) use of aspirin: Secondary | ICD-10-CM | POA: Insufficient documentation

## 2021-01-27 DIAGNOSIS — E119 Type 2 diabetes mellitus without complications: Secondary | ICD-10-CM | POA: Insufficient documentation

## 2021-01-27 DIAGNOSIS — Y92002 Bathroom of unspecified non-institutional (private) residence single-family (private) house as the place of occurrence of the external cause: Secondary | ICD-10-CM | POA: Diagnosis not present

## 2021-01-27 DIAGNOSIS — F17210 Nicotine dependence, cigarettes, uncomplicated: Secondary | ICD-10-CM | POA: Insufficient documentation

## 2021-01-27 DIAGNOSIS — S0990XA Unspecified injury of head, initial encounter: Secondary | ICD-10-CM | POA: Insufficient documentation

## 2021-01-27 DIAGNOSIS — R110 Nausea: Secondary | ICD-10-CM | POA: Diagnosis not present

## 2021-01-27 DIAGNOSIS — H53149 Visual discomfort, unspecified: Secondary | ICD-10-CM | POA: Insufficient documentation

## 2021-01-27 DIAGNOSIS — Z794 Long term (current) use of insulin: Secondary | ICD-10-CM | POA: Diagnosis not present

## 2021-01-27 DIAGNOSIS — G9389 Other specified disorders of brain: Secondary | ICD-10-CM | POA: Diagnosis not present

## 2021-01-27 DIAGNOSIS — E1165 Type 2 diabetes mellitus with hyperglycemia: Secondary | ICD-10-CM | POA: Diagnosis not present

## 2021-01-27 DIAGNOSIS — Z79899 Other long term (current) drug therapy: Secondary | ICD-10-CM | POA: Diagnosis not present

## 2021-01-28 ENCOUNTER — Encounter (HOSPITAL_COMMUNITY): Payer: Self-pay | Admitting: Emergency Medicine

## 2021-01-28 ENCOUNTER — Emergency Department (HOSPITAL_COMMUNITY): Payer: Medicaid Other

## 2021-01-28 ENCOUNTER — Other Ambulatory Visit: Payer: Self-pay

## 2021-01-28 ENCOUNTER — Emergency Department (HOSPITAL_COMMUNITY)
Admission: EM | Admit: 2021-01-28 | Discharge: 2021-01-28 | Disposition: A | Discharge status: Home / Self Care | Payer: Medicaid Other | Attending: Emergency Medicine | Admitting: Emergency Medicine

## 2021-01-28 DIAGNOSIS — S0990XA Unspecified injury of head, initial encounter: Secondary | ICD-10-CM

## 2021-01-28 DIAGNOSIS — G9389 Other specified disorders of brain: Secondary | ICD-10-CM | POA: Diagnosis not present

## 2021-01-28 MED ORDER — KETOROLAC TROMETHAMINE 15 MG/ML IJ SOLN
15.0000 mg | Freq: Once | INTRAMUSCULAR | Status: AC
  Administered 2021-01-28: 15 mg via INTRAMUSCULAR
  Filled 2021-01-28: qty 1

## 2021-01-28 MED ORDER — METOCLOPRAMIDE HCL 5 MG/ML IJ SOLN
10.0000 mg | Freq: Once | INTRAMUSCULAR | Status: AC
  Administered 2021-01-28: 10 mg via INTRAMUSCULAR
  Filled 2021-01-28: qty 2

## 2021-01-28 NOTE — ED Triage Notes (Signed)
Per EMS, pt fell asleep on the toilet last night and fell off, hitting the left side of her head.  She does not know if she woke up right after she fell or if she was unconscious from the fall.  Pt reports her left sided body aches have been relieved by a salt bath.  OTC medications have not relieved her headaches that have come and gone all day, along w/ dizziness.  Pt was ambulatory on scene w/ EMS.  Denies blood thinners.  160/90 98HR 98%RA 18RR

## 2021-01-28 NOTE — Discharge Instructions (Signed)
You had a normal neurologic exam while in the emergency department and your head CT is negative.  This is reassuring.  It is possible that you may have a mild concussion.  A concussion is a diagnosis that is made clinically and does not show any abnormal results on imaging such as a CT scan or MRI.    A concussion may cause a persistent headache over the next few days. This can be brought on or worsened by loud sounds or bright lights. Try to avoid excessive use of cell phones, television, video games as this may worsening headaches.  Avoid strenuous activity and heavy lifting over the next few days.  If you develop severe worsening of your headache, vision changes or loss, uncontrolled vomiting, numbness or tingling to one side of your body, difficulty walking or lifting your arms or legs, return promptly to the emergency department for repeat evaluation.

## 2021-01-28 NOTE — ED Provider Notes (Signed)
[facility] EMERGENCY DEPARTMENT Provider Note   CSN: [id number] Arrival date & time: 01/27/21  2351     History Chief Complaint  Patient presents with  . Fall  . Headache    Ellen Avery is a 55 y.o. female.  55 year old female presents to emergency department for evaluation of head injury.  States that she fell asleep on the toilet 24 hours ago and fell off hitting the left side of her head.  While triage note states the patient was unaware if she woke up right after, she explains to me that she did not lose consciousness from the fall.  She has had a persistent left-sided headache which has been waxing and waning.  It did mildly improve with Motrin.  Notes some mild photophobia and nausea.  No vision loss, tinnitus, extremity numbness or paresthesias, extremity weakness, vomiting.  She is not chronically anticoagulated.  Has been ambulatory since the incident without issue.        Past Medical History:  Diagnosis Date  . Anxiety   . Arthritis   . Chronic headaches   . DDD (degenerative disc disease), cervical   . Fibromyalgia   . GERD (gastroesophageal reflux disease)   . History of colon polyps   . History of psychosis   . Hypercholesteremia   . Hypertension   . IBS (irritable bowel syndrome)   . MDD (major depressive disorder)   . Right sided facial pain   . Seasonal allergic rhinitis   . Sinus congestion    12-24-2018 per pt no fever  . Type 2 diabetes mellitus treated with insulin ([place])   . Vitamin D deficiency   . Wears dentures    full upper,  partial lower  . Wears glasses     Patient Active Problem List   Diagnosis Date Noted  . Chest pain in adult 07/20/2019  . IDDM (insulin dependent diabetes mellitus) 07/08/2018  . HTN (hypertension) 07/08/2018  . HLD (hyperlipidemia) 07/08/2018  . Altered mental status 03/02/2016  . MDD (major depressive disorder), recurrent episode, moderate ([place]) 03/02/2016  . Psychoses [facility])     Past  Surgical History:  Procedure Laterality Date  . ARTERY BIOPSY Right 12/28/2018   Procedure: RIGHT BIOPSY TEMPORAL ARTERY;  Surgeon: Kieth Brightly, Arta Bruce, MD;  Location: [facility];  Service: General;  Laterality: Right;  . CARDIAC CATHETERIZATION    . MULTIPLE TOOTH EXTRACTIONS  04/ 2019    sedation  . TOTAL ABDOMINAL HYSTERECTOMY W/ BILATERAL SALPINGOOPHORECTOMY  2009     OB History   No obstetric history on file.     Family History  Problem Relation Age of Onset  . Diabetes Mother   . Heart disease Paternal Grandmother   . Breast cancer Paternal Grandmother   . Diabetes Paternal Uncle   . Heart Problems Brother   . Breast cancer Maternal Aunt   . Head & neck cancer Cousin   . Cervical cancer Paternal Aunt   . Colon cancer Neg Hx   . Colon polyps Neg Hx   . Esophageal cancer Neg Hx   . Rectal cancer Neg Hx   . Stomach cancer Neg Hx     Social History   Tobacco Use  . Smoking status: Current Every Day Smoker    Packs/day: 1.00    Years: 35.00    Pack years: 35.00    Types: Cigarettes  . Smokeless tobacco: Never Used  Vaping Use  . Vaping Use: Never used  Substance  Use Topics  . Alcohol use: No  . Drug use: No    Home Medications Prior to Admission medications   Medication Sig Start Date End Date Taking? Authorizing Provider  ACCU-CHEK AVIVA PLUS test strip USE TO CHECK BLOOD SUGAR 4 TIMES DAILY 05/25/18   [provider]  albuterol (VENTOLIN HFA) 108 (90 Base) MCG/ACT inhaler Inhale 1-2 puffs into the lungs every 6 (six) hours as needed for wheezing or shortness of breath. 01/10/20   Wieters, Hallie C, PA-C  amoxicillin-clavulanate (AUGMENTIN) 875-125 MG tablet Take 1 tablet by mouth every 12 (twelve) hours. 12/06/20   Raylene Everts, MD  aspirin EC 81 MG tablet Take 81 mg by mouth daily.    [provider]  azelastine (ASTELIN) 0.1 % nasal spray Place 2 sprays into both nostrils 2 (two) times daily. 04/16/20   Tasia Catchings, Amy V, PA-C   cetirizine (ZYRTEC) 10 MG tablet Take 10 mg by mouth daily.    [provider]  cetirizine-pseudoephedrine (ZYRTEC-D) 5-120 MG tablet Take 1 tablet by mouth daily. 10/25/19   Avegno, [name], FNP  Cholecalciferol (VITAMIN D3) 3000 units TABS Take 3,000 Units by mouth daily.    [provider]  dextromethorphan-guaiFENesin (MUCINEX DM) 30-600 MG 12hr tablet Take 1 tablet by mouth 2 (two) times daily. 12/06/20   Raylene Everts, MD  DULoxetine (CYMBALTA) 60 MG capsule Take 1 capsule (60 mg total) by mouth daily. 04/03/20   Sharion Balloon, NP  Famotidine (PEPCID AC PO) Take by mouth as needed.    [provider]  fluticasone (FLONASE) 50 MCG/ACT nasal spray Place 1 spray into both nostrils daily. 04/03/20   Sharion Balloon, NP  gabapentin (NEURONTIN) 100 MG capsule Take 1-2 capsules (100-200 mg total) by mouth See admin instructions. Take 100 mg in the morning, 100 mg at lunch, and 200 mg in the evening 04/03/20   Sharion Balloon, NP  hydrOXYzine (ATARAX/VISTARIL) 25 MG tablet Take 1 tablet (25 mg total) by mouth every 6 (six) hours as needed for anxiety. 09/13/18   [name], [name], MD  insulin glargine (LANTUS) 100 UNIT/ML injection Inject 50 Units into the skin at bedtime.     [provider]  JANUVIA 50 MG tablet Take 50 mg by mouth daily. 07/07/19   [provider]  LANTUS SOLOSTAR 100 UNIT/ML Solostar Pen SMARTSIG:55 Unit(s) SUB-Q Every Night 12/12/19   [provider]  Lidocaine (HM LIDOCAINE PATCH) 4 % PTCH Apply 1 patch topically daily. 01/06/21   Jacquelin Hawking, NP  losartan-hydrochlorothiazide (HYZAAR) 100-12.5 MG tablet Take 1 tablet by mouth daily.  11/01/18   [provider]  magnesium hydroxide (MILK OF MAGNESIA) 400 MG/5ML suspension Take by mouth daily as needed for mild constipation.    [provider]  metFORMIN (GLUCOPHAGE) 1000 MG tablet Take 1,000 mg by mouth daily with breakfast.     [provider]   methocarbamol (ROBAXIN) 500 MG tablet Take 1 tablet (500 mg total) by mouth 2 (two) times daily. 02/11/20   Faustino Congress, NP  metoprolol tartrate (LOPRESSOR) 50 MG tablet Take 2 tablets (100 mg total) by mouth once for 1 dose. Take 2 hours prior to cardiac CTA. 07/20/19 07/20/19  Richardo Priest, MD  naproxen (NAPROSYN) 500 MG tablet Take 1 tablet (500 mg total) by mouth 2 (two) times daily. 07/09/20   Wieters, Hallie C, PA-C  ondansetron (ZOFRAN ODT) 4 MG disintegrating tablet Take 1 tablet (4 mg total) by mouth every  8 (eight) hours as needed for nausea or vomiting. 08/16/20   Renold Genta, MD  pseudoephedrine (SUDAFED) 60 MG tablet Take 60 mg by mouth every 4 (four) hours as needed for congestion.    [provider]  rosuvastatin (CRESTOR) 10 MG tablet Take 10 mg by mouth at bedtime. 12/12/19   [provider]  simvastatin (ZOCOR) 40 MG tablet Take 40 mg by mouth every evening. Reported on 03/01/2016    [provider]  sodium chloride (OCEAN) 0.65 % SOLN nasal spray Place 1 spray into both nostrils as needed for congestion. 08/30/18   Law, [name], PA-C  tiZANidine (ZANAFLEX) 4 MG tablet Take 1 tablet (4 mg total) by mouth every 6 (six) hours as needed for muscle spasms. 08/16/20   Renold Genta, MD  tiZANidine (ZANAFLEX) 4 MG tablet Take 1 tablet (4 mg total) by mouth every 6 (six) hours as needed for muscle spasms. 01/06/21   Jacquelin Hawking, NP  traMADol (ULTRAM) 50 MG tablet Take 1 tablet (50 mg total) by mouth every 6 (six) hours as needed. 01/08/21   Jacquelin Hawking, NP    Allergies    Doxycycline and Flagyl [metronidazole]  Review of Systems   Review of Systems  Ten systems reviewed and are negative for acute change, except as noted in the HPI.    Physical Exam Updated Vital Signs BP 117/80 (BP Location: Right Arm)   Pulse 85   Temp 98.4 F (36.9 C) (Oral)   Resp 16   SpO2 97%   Physical Exam Vitals and nursing note reviewed.   Constitutional:      General: She is not in acute distress.    Appearance: She is well-developed and well-nourished. She is not diaphoretic.     Comments: Nontoxic appearing and in NAD  HENT:     Head: Normocephalic and atraumatic.     Comments: No battle sign or raccoon's eyes Eyes:     General: No scleral icterus.    Extraocular Movements: EOM normal.     Conjunctiva/sclera: Conjunctivae normal.  Neck:     Comments: No meningismus Pulmonary:     Effort: Pulmonary effort is normal. No respiratory distress.     Comments: Respirations even and unlabored Musculoskeletal:        General: Normal range of motion.     Cervical back: Normal range of motion.  Skin:    General: Skin is warm and dry.     Coloration: Skin is not pale.     Findings: No erythema or rash.  Neurological:     Mental Status: She is alert and oriented to person, place, and time.     Comments: GCS 15. Speech is goal oriented. No cranial nerve deficits appreciated; symmetric eyebrow raise, no facial drooping, tongue midline. Patient has equal grip strength bilaterally with 5/5 strength against resistance in all major muscle groups bilaterally. Sensation to light touch intact. Patient moves extremities without ataxia.  Psychiatric:        Mood and Affect: Mood and affect normal.        Behavior: Behavior normal.     ED Results / Procedures / Treatments   Labs (all labs ordered are listed, but only abnormal results are displayed) Labs Reviewed - No data to display  EKG None  Radiology CT Head Wo Contrast  Result Date: 01/28/2021 CLINICAL DATA:  Head trauma, fall from toilet with left head strike EXAM: CT HEAD WITHOUT CONTRAST TECHNIQUE: Contiguous axial images were obtained from  the base of the skull through the vertex without intravenous contrast. COMPARISON:  CT 04/11/2020 FINDINGS: Brain: No evidence of acute infarction, hemorrhage, hydrocephalus, extra-axial collection, visible mass lesion or mass effect.  Stable benign dural calcifications. Partially empty sella, similar to prior. Cerebellar tonsils are normally positioned. Vascular: No hyperdense vessel or unexpected calcification. Skull: No significant scalp swelling or large hematoma. No calvarial fracture or acute or conspicuous osseous lesion. Sinuses/Orbits: Paranasal sinuses and mastoid air cells are predominantly clear. Pneumatization of the petrous apices. Orbits only partially included without gross abnormality. Other: Debris in the right external auditory canal. IMPRESSION: 1. No acute intracranial abnormality. No significant scalp swelling or calvarial fracture. 2. Debris in the right external auditory canal, correlate for cerumen impaction. Electronically Signed   By: Lovena Le M.D.   On: 01/28/2021 00:50    Procedures Procedures   Medications Ordered in ED Medications  metoCLOPramide (REGLAN) injection 10 mg (10 mg Intramuscular Given 01/28/21 0445)  ketorolac (TORADOL) 15 MG/ML injection 15 mg (15 mg Intramuscular Given 01/28/21 0445)    ED Course  I have reviewed the triage vital signs and the nursing notes.  Pertinent labs & imaging results that were available during my care of the patient were reviewed by me and considered in my medical decision making (see chart for details).    MDM Rules/Calculators/A&P                          Patient presents to the emergency department for evaluation of headache which began 24 hours ago after striking head when falling off the toilet.  Head CT negative for acute intracranial abnormality.  Neurologic exam today is nonfocal.  On reassessment, the patient has had significant improvement in headache symptoms following a migraine cocktail.  I do not believe further emergent workup is indicated at this time.  Discussed with patient the possibility of a mild concussion.  Encouraged primary care follow-up.  Return precautions discussed and provided.  Patient discharged in stable condition with no  unaddressed concerns.   Final Clinical Impression(s) / ED Diagnoses Final diagnoses:  Injury of head, initial encounter    Rx / DC Orders ED Discharge Orders    None       Antonietta Breach, PA-C 01/28/21 0515    Wyvonnia Dusky, MD 01/28/21 0530

## 2021-02-05 ENCOUNTER — Telehealth: Payer: Medicaid Other | Admitting: Family Medicine

## 2021-02-05 ENCOUNTER — Encounter: Payer: Self-pay | Admitting: Family Medicine

## 2021-02-05 DIAGNOSIS — J069 Acute upper respiratory infection, unspecified: Secondary | ICD-10-CM

## 2021-02-05 MED ORDER — LORATADINE 10 MG PO TABS: 10.0000 mg | ORAL_TABLET | Freq: Every day | ORAL | 0 refills | Status: DC

## 2021-02-05 MED ORDER — PROMETHAZINE-DM 6.25-15 MG/5ML PO SYRP: 2.5000 mL | ORAL_SOLUTION | Freq: Four times a day (QID) | ORAL | 0 refills | Status: DC | PRN

## 2021-02-05 MED ORDER — ALBUTEROL SULFATE HFA 108 (90 BASE) MCG/ACT IN AERS: 1.0000 | INHALATION_SPRAY | Freq: Four times a day (QID) | RESPIRATORY_TRACT | 0 refills | Status: DC | PRN

## 2021-02-05 NOTE — Progress Notes (Signed)
[name], holaday are scheduled for a virtual visit with your provider today.    Just as we do with appointments in the office, we must obtain your consent to participate.  Your consent will be active for this visit and any virtual visit you may have with one of our providers in the next 365 days.    If you have a MyChart account, I can also send a copy of this consent to you electronically.  All virtual visits are billed to your insurance company just like a traditional visit in the office.  As this is a virtual visit, video technology does not allow for your provider to perform a traditional examination.  This may limit your provider's ability to fully assess your condition.  If your provider identifies any concerns that need to be evaluated in person or the need to arrange testing such as labs, EKG, etc, we will make arrangements to do so.    Although advances in technology are sophisticated, we cannot ensure that it will always work on either your end or our end.  If the connection with a video visit is poor, we may have to switch to a telephone visit.  With either a video or telephone visit, we are not always able to ensure that we have a secure connection.   I need to obtain your verbal consent now.   Are you willing to proceed with your visit today?   KYAH BUESING has provided verbal consent on 02/05/2021 for a virtual visit (video or telephone).   [name], NP 02/05/2021  10:50 AM   Date:  02/05/2021   ID:  Ellen Avery, DOB 01-Sep-1966, MRN [id number]  Patient Location: Home Provider Location: Home Office   Participants: Patient and Provider for Visit and Wrap up  Method of visit: Video  Location of Patient: Home Location of Provider: Home Office Consent was obtain for visit over the video. Services rendered by provider: Visit was performed via video  A video enabled telemedicine application was used and I verified that I am speaking with the correct person using two  identifiers.  PCP:  Javier Docker, MD   Chief Complaint:  Sinus symptoms   History of Present Illness:    Ellen Avery is a 55 y.o. female with history as stated below. Presents video telehealth for an acute care visit sinus/allergiest Has been pfizer vaccinate, no booster yet. Onset of symptoms was a few days ago- was traveling back from [place]. [name] was sick during the car ride.  Was around her mom who was also cold/cough sick. She reports coughing a lot, congestion in head and face. Reports runny nose. Reports some neck and back pain. Took COVID home test while on visit- negative   Past Medical, Surgical, Social History, Allergies, and Medications have been Reviewed.  Past Medical History:  Diagnosis Date  . Anxiety   . Arthritis   . Chronic headaches   . DDD (degenerative disc disease), cervical   . Fibromyalgia   . GERD (gastroesophageal reflux disease)   . History of colon polyps   . History of psychosis   . Hypercholesteremia   . Hypertension   . IBS (irritable bowel syndrome)   . MDD (major depressive disorder)   . Right sided facial pain   . Seasonal allergic rhinitis   . Sinus congestion    12-24-2018 per pt no fever  . Type 2 diabetes mellitus treated with insulin ([place])   . Vitamin D  deficiency   . Wears dentures    full upper,  partial lower  . Wears glasses     Current Meds  Medication Sig  . albuterol (VENTOLIN HFA) 108 (90 Base) MCG/ACT inhaler Inhale 1-2 puffs into the lungs every 6 (six) hours as needed for wheezing or shortness of breath.  . loratadine (CLARITIN) 10 MG tablet Take 1 tablet (10 mg total) by mouth daily.  . promethazine-dextromethorphan (PROMETHAZINE-DM) 6.25-15 MG/5ML syrup Take 2.5 mLs by mouth 4 (four) times daily as needed for cough.   Current Facility-Administered Medications for the 02/05/21 encounter (Video Visit) with [name], NP  Medication  . nicotine (NICODERM CQ - dosed in mg/24 hr) patch 14 mg   . nicotine polacrilex (NICORETTE) gum 2 mg     Allergies:   Doxycycline and Flagyl [metronidazole]   ROS See HPI for history of present illness.  Physical Exam Constitutional:      Appearance: Normal appearance. She is well-developed and well-groomed.  HENT:     Head: Normocephalic and atraumatic.     Right Ear: External ear normal.     Left Ear: External ear normal.  Eyes:     General: Allergic shiner present.     Conjunctiva/sclera: Conjunctivae normal.  Pulmonary:     Comments: Mild cough, no shortness of breath noted  Musculoskeletal:        General: Normal range of motion.     Cervical back: Normal range of motion.  Neurological:     Mental Status: She is alert and oriented to person, place, and time.  Psychiatric:        Mood and Affect: Mood normal.        Behavior: Behavior normal. Behavior is cooperative.        Thought Content: Thought content normal.        Judgment: Judgment normal.               A&P  1. URI with cough and congestion -negative covid test while on the visit -hx of allergies -reports not on inhaler or flonase needs refills (provided) -will treat as URI vs Diff of Allergies -advised to call PCP if not improved in the next 5 days   - loratadine (CLARITIN) 10 MG tablet; Take 1 tablet (10 mg total) by mouth daily.  Dispense: 90 tablet; Refill: 0 - albuterol (VENTOLIN HFA) 108 (90 Base) MCG/ACT inhaler; Inhale 1-2 puffs into the lungs every 6 (six) hours as needed for wheezing or shortness of breath.  Dispense: 18 g; Refill: 0 - promethazine-dextromethorphan (PROMETHAZINE-DM) 6.25-15 MG/5ML syrup; Take 2.5 mLs by mouth 4 (four) times daily as needed for cough.  Dispense: 118 mL; Refill: 0    Time:   Today, I have spent 15  minutes with the patient with telehealth technology discussing the above problems, reviewing the chart, previous notes, medications and orders.    Tests Ordered: No orders of the defined types were placed in this  encounter.   Medication Changes: Meds ordered this encounter  Medications  . loratadine (CLARITIN) 10 MG tablet    Sig: Take 1 tablet (10 mg total) by mouth daily.    Dispense:  90 tablet    Refill:  0    Order Specific Question:   Supervising Provider    Answer:   SIMPSON, MARGARET E [[id number]]  . albuterol (VENTOLIN HFA) 108 (90 Base) MCG/ACT inhaler    Sig: Inhale 1-2 puffs into the lungs every 6 (six) hours as needed for wheezing  or shortness of breath.    Dispense:  18 g    Refill:  0    Order Specific Question:   Supervising Provider    Answer:   SIMPSON, MARGARET E [[id number]]  . promethazine-dextromethorphan (PROMETHAZINE-DM) 6.25-15 MG/5ML syrup    Sig: Take 2.5 mLs by mouth 4 (four) times daily as needed for cough.    Dispense:  118 mL    Refill:  0    Order Specific Question:   Supervising Provider    Answer:   Fayrene Helper [[id number]]     Disposition:  Follow up as needed Signed, [name], NP  02/05/2021 10:50 AM

## 2021-02-05 NOTE — Patient Instructions (Signed)
Restart medications for allergies  Call PCP is not improved by or feeling better by next Monday -Wednesday  Hydrate well, rest, and take medications as directed.  I hope you feel better soon

## 2021-02-13 ENCOUNTER — Encounter: Payer: Self-pay | Admitting: Family Medicine

## 2021-02-13 ENCOUNTER — Telehealth: Payer: Medicaid Other | Admitting: Family Medicine

## 2021-02-13 DIAGNOSIS — J0140 Acute pansinusitis, unspecified: Secondary | ICD-10-CM | POA: Diagnosis not present

## 2021-02-13 MED ORDER — BENZONATATE 100 MG PO CAPS: 100.0000 mg | ORAL_CAPSULE | Freq: Three times a day (TID) | ORAL | 0 refills | Status: DC | PRN

## 2021-02-13 MED ORDER — AZITHROMYCIN 250 MG PO TABS: ORAL_TABLET | ORAL | 0 refills | Status: DC

## 2021-02-13 MED ORDER — DEXTROMETHORPHAN POLISTIREX ER 30 MG/5ML PO SUER: 30.0000 mg | ORAL | 1 refills | Status: DC | PRN

## 2021-02-13 NOTE — Progress Notes (Signed)
[name], jian are scheduled for a virtual visit with your provider today.    Just as we do with appointments in the office, we must obtain your consent to participate.  Your consent will be active for this visit and any virtual visit you may have with one of our providers in the next 365 days.    If you have a MyChart account, I can also send a copy of this consent to you electronically.  All virtual visits are billed to your insurance company just like a traditional visit in the office.  As this is a virtual visit, video technology does not allow for your provider to perform a traditional examination.  This may limit your provider's ability to fully assess your condition.  If your provider identifies any concerns that need to be evaluated in person or the need to arrange testing such as labs, EKG, etc, we will make arrangements to do so.    Although advances in technology are sophisticated, we cannot ensure that it will always work on either your end or our end.  If the connection with a video visit is poor, we may have to switch to a telephone visit.  With either a video or telephone visit, we are not always able to ensure that we have a secure connection.   I need to obtain your verbal consent now.   Are you willing to proceed with your visit today?   [name] has provided verbal consent on 02/13/2021 for a virtual visit (video or telephone).   [name], NP 02/13/2021  12:32 PM   Date:  02/13/2021   ID:  Ellen Avery, DOB 08-30-1966, MRN [id number]  Patient Location: Home Provider Location: Home Office   Participants: Patient and Provider for Visit and Wrap up  Method of visit: Video  Location of Patient: Home Location of Provider: Home Office Consent was obtain for visit over the video. Services rendered by provider: Visit was performed via video  A video enabled telemedicine application was used and I verified that I am speaking with the correct person using two  identifiers.  PCP:  Javier Docker, MD   Chief Complaint:  On going sinus symptoms   History of Present Illness:    Ellen Avery is a 55 y.o. female with history as stated below. Presents video telehealth for an acute care visit due to on going sinus symptoms. On 02/05/2021 she started having sinus symptoms was a few days prior- was traveling back from [place]. [name] was sick during the car ride.  Was around her mom who was also cold/cough sick. She reports coughing a lot, congestion in head and face. Reports runny nose. Reports some neck and back pain. Took COVID home test while on visit- negative.  She calls back in today not feeling better with worsening congestion.   Denies shortness of breath, chest pain, fevers or chills  Past Medical, Surgical, Social History, Allergies, and Medications have been Reviewed.  Past Medical History:  Diagnosis Date  . Anxiety   . Arthritis   . Chronic headaches   . DDD (degenerative disc disease), cervical   . Fibromyalgia   . GERD (gastroesophageal reflux disease)   . History of colon polyps   . History of psychosis   . Hypercholesteremia   . Hypertension   . IBS (irritable bowel syndrome)   . MDD (major depressive disorder)   . Right sided facial pain   . Seasonal allergic rhinitis   .  Sinus congestion    12-24-2018 per pt no fever  . Type 2 diabetes mellitus treated with insulin ([place])   . Vitamin D deficiency   . Wears dentures    full upper,  partial lower  . Wears glasses     No outpatient medications have been marked as taking for the 02/13/21 encounter (Appointment) with [name], NP.   Current Facility-Administered Medications for the 02/13/21 encounter (Appointment) with [name], NP  Medication  . nicotine (NICODERM CQ - dosed in mg/24 hr) patch 14 mg  . nicotine polacrilex (NICORETTE) gum 2 mg     Allergies:   Doxycycline and Flagyl [metronidazole]   ROS See HPI for history of present  illness.  Physical Exam          Constitutional:      Appearance: Normal appearance. She is well-developed and well-groomed.  HENT:     Head: Normocephalic and atraumatic.     Right Ear: External ear normal.     Left Ear: External ear normal.  Eyes:     General: Allergic shiner present.     Conjunctiva/sclera: Conjunctivae normal.  Pulmonary:     Comments: Mild cough, no shortness of breath noted  Musculoskeletal:        General: Normal range of motion.     Cervical back: Normal range of motion.  Neurological:     Mental Status: She is alert and oriented to person, place, and time.  Psychiatric:        Mood and Affect: Mood normal.        Behavior: Behavior normal. Behavior is cooperative.        Thought Content: Thought content normal.        Judgment: Judgment normal.   A&P  1. Acute non-recurrent pansinusitis -over a week of on going worsen symptoms -will treat for sinus infection -reviewed OTC measures she can use as well -advised if not improved she will need to see her PCP in person     Time:   Today, I have spent 10 minutes with the patient with telehealth technology discussing the above problems, reviewing the chart, previous notes, medications and orders.    Tests Ordered: No orders of the defined types were placed in this encounter.   Medication Changes: No orders of the defined types were placed in this encounter.    Disposition:  Follow up w PCP as needed Signed, [name], NP  02/13/2021 12:32 PM

## 2021-02-13 NOTE — Patient Instructions (Signed)

## 2021-04-06 ENCOUNTER — Telehealth: Payer: Medicaid Other | Admitting: Physician Assistant

## 2021-04-06 ENCOUNTER — Encounter: Payer: Self-pay | Admitting: Physician Assistant

## 2021-04-06 DIAGNOSIS — J310 Chronic rhinitis: Secondary | ICD-10-CM

## 2021-04-06 MED ORDER — LEVOCETIRIZINE DIHYDROCHLORIDE 5 MG PO TABS: 5.0000 mg | ORAL_TABLET | Freq: Every evening | ORAL | 0 refills | Status: DC

## 2021-04-06 MED ORDER — FLUTICASONE PROPIONATE 50 MCG/ACT NA SUSP: 2.0000 | Freq: Every day | NASAL | 0 refills | Status: DC

## 2021-04-06 NOTE — Patient Instructions (Signed)
Instructions sent to patients MyChart.

## 2021-04-06 NOTE — Progress Notes (Signed)
Ellen Avery are scheduled for a virtual visit with your provider today.    Just as we do with appointments in the office, we must obtain your consent to participate.  Your consent will be active for this visit and any virtual visit you may have with one of our providers in the next 365 days.    If you have a MyChart account, I can also send a copy of this consent to you electronically.  All virtual visits are billed to your insurance company just like a traditional visit in the office.  As this is a virtual visit, video technology does not allow for your provider to perform a traditional examination.  This may limit your provider's ability to fully assess your condition.  If your provider identifies any concerns that need to be evaluated in person or the need to arrange testing such as labs, EKG, etc, we will make arrangements to do so.    Although advances in technology are sophisticated, we cannot ensure that it will always work on either your end or our end.  If the connection with a video visit is poor, we may have to switch to a telephone visit.  With either a video or telephone visit, we are not always able to ensure that we have a secure connection.   I need to obtain your verbal consent now.   Are you willing to proceed with your visit today?   Ellen Avery has provided verbal consent on 04/06/2021 for a virtual visit (video or telephone).  Ellen Rio, PA-C 04/06/2021  11:38 AM  Virtual Visit via Video   I connected with patient on 04/06/21 at 11:45 AM EDT by a video enabled telemedicine application and verified that I am speaking with the correct person using two identifiers.  Location patient: Home Location provider: [place] participating in the virtual visit: Patient, Provider  I discussed the limitations of evaluation and management by telemedicine and the availability of in person appointments. The patient expressed understanding and agreed to  proceed.  Subjective:   HPI:  Patient presents via [facility] today to discuss worsening allergy symptoms.  Patient endorses longstanding history of seasonal allergies, well controlled with OTC antihistamine and use of Flonase.  Over the past week or so she is noticing increasing allergy symptoms with nasal congestion and sinus pressure associated with mild fatigue.  Denies any eye itching or watering.  Does note rhinorrhea but has not noticed any substantial postnasal drip.  Denies any chest congestion or chest tightness.  Denies sinus pain, ear pain or tooth pain.  Denies recent travel.  States she did take a COVID test just to be cautious and this was negative.  Of note she has been out of her antihistamine and Flonase for the past couple of weeks.    ROS:   See pertinent positives and negatives per HPI.  Patient Active Problem List   Diagnosis Date Noted  . Chest pain in adult 07/20/2019  . IDDM (insulin dependent diabetes mellitus) 07/08/2018  . HTN (hypertension) 07/08/2018  . HLD (hyperlipidemia) 07/08/2018  . Altered mental status 03/02/2016  . MDD (major depressive disorder), recurrent episode, moderate ([place]) 03/02/2016  . Psychoses ([place])     Social History   Tobacco Use  . Smoking status: Current Every Day Smoker    Packs/day: 1.00    Years: 35.00    Pack years: 35.00    Types: Cigarettes  . Smokeless tobacco: Never Used  Substance  Use Topics  . Alcohol use: No    Current Outpatient Medications:  .  ACCU-CHEK AVIVA PLUS test strip, USE TO CHECK BLOOD SUGAR 4 TIMES DAILY, Disp: , Rfl: 2 .  albuterol (VENTOLIN HFA) 108 (90 Base) MCG/ACT inhaler, Inhale 1-2 puffs into the lungs every 6 (six) hours as needed for wheezing or shortness of breath., Disp: 18 g, Rfl: 0 .  amoxicillin-clavulanate (AUGMENTIN) 875-125 MG tablet, Take 1 tablet by mouth every 12 (twelve) hours., Disp: 14 tablet, Rfl: 0 .  aspirin EC 81 MG tablet, Take 81 mg by mouth daily., Disp: , Rfl:  .   azelastine (ASTELIN) 0.1 % nasal spray, Place 2 sprays into both nostrils 2 (two) times daily., Disp: 30 mL, Rfl: 0 .  azithromycin (ZITHROMAX) 250 MG tablet, Take 2 tablets (500 mg) on the first day. Then take 1 tablet (250mg ) on days 2-5., Disp: 6 tablet, Rfl: 0 .  benzonatate (TESSALON) 100 MG capsule, Take 1 capsule (100 mg total) by mouth 3 (three) times daily as needed for cough., Disp: 30 capsule, Rfl: 0 .  Cholecalciferol (VITAMIN D3) 3000 units TABS, Take 3,000 Units by mouth daily., Disp: , Rfl:  .  dextromethorphan (DELSYM) 30 MG/5ML liquid, Take 5 mLs (30 mg total) by mouth as needed for cough., Disp: 148 mL, Rfl: 1 .  dextromethorphan-guaiFENesin (MUCINEX DM) 30-600 MG 12hr tablet, Take 1 tablet by mouth 2 (two) times daily., Disp: 20 tablet, Rfl: 0 .  DULoxetine (CYMBALTA) 60 MG capsule, Take 1 capsule (60 mg total) by mouth daily., Disp: 14 capsule, Rfl: 0 .  Famotidine (PEPCID AC PO), Take by mouth as needed., Disp: , Rfl:  .  fluticasone (FLONASE) 50 MCG/ACT nasal spray, Place 1 spray into both nostrils daily., Disp: 16 g, Rfl: 0 .  gabapentin (NEURONTIN) 100 MG capsule, Take 1-2 capsules (100-200 mg total) by mouth See admin instructions. Take 100 mg in the morning, 100 mg at lunch, and 200 mg in the evening, Disp: 60 capsule, Rfl: 0 .  hydrOXYzine (ATARAX/VISTARIL) 25 MG tablet, Take 1 tablet (25 mg total) by mouth every 6 (six) hours as needed for anxiety., Disp: 20 tablet, Rfl: 0 .  insulin glargine (LANTUS) 100 UNIT/ML injection, Inject 50 Units into the skin at bedtime. , Disp: , Rfl:  .  JANUVIA 50 MG tablet, Take 50 mg by mouth daily., Disp: , Rfl:  .  LANTUS SOLOSTAR 100 UNIT/ML Solostar Pen, SMARTSIG:55 Unit(s) SUB-Q Every Night, Disp: , Rfl:  .  Lidocaine (HM LIDOCAINE PATCH) 4 % PTCH, Apply 1 patch topically daily., Disp: 10 patch, Rfl: 1 .  loratadine (CLARITIN) 10 MG tablet, Take 1 tablet (10 mg total) by mouth daily., Disp: 90 tablet, Rfl: 0 .   losartan-hydrochlorothiazide (HYZAAR) 100-12.5 MG tablet, Take 1 tablet by mouth daily. , Disp: , Rfl:  .  magnesium hydroxide (MILK OF MAGNESIA) 400 MG/5ML suspension, Take by mouth daily as needed for mild constipation., Disp: , Rfl:  .  metFORMIN (GLUCOPHAGE) 1000 MG tablet, Take 1,000 mg by mouth daily with breakfast. , Disp: , Rfl:  .  methocarbamol (ROBAXIN) 500 MG tablet, Take 1 tablet (500 mg total) by mouth 2 (two) times daily., Disp: 20 tablet, Rfl: 0 .  metoprolol tartrate (LOPRESSOR) 50 MG tablet, Take 2 tablets (100 mg total) by mouth once for 1 dose. Take 2 hours prior to cardiac CTA., Disp: 2 tablet, Rfl: 0 .  naproxen (NAPROSYN) 500 MG tablet, Take 1 tablet (500 mg total) by mouth  2 (two) times daily., Disp: 30 tablet, Rfl: 0 .  ondansetron (ZOFRAN ODT) 4 MG disintegrating tablet, Take 1 tablet (4 mg total) by mouth every 8 (eight) hours as needed for nausea or vomiting., Disp: 20 tablet, Rfl: 0 .  pseudoephedrine (SUDAFED) 60 MG tablet, Take 60 mg by mouth every 4 (four) hours as needed for congestion., Disp: , Rfl:  .  rosuvastatin (CRESTOR) 10 MG tablet, Take 10 mg by mouth at bedtime., Disp: , Rfl:  .  simvastatin (ZOCOR) 40 MG tablet, Take 40 mg by mouth every evening. Reported on 03/01/2016, Disp: , Rfl:  .  sodium chloride (OCEAN) 0.65 % SOLN nasal spray, Place 1 spray into both nostrils as needed for congestion., Disp: 15 mL, Rfl: 0 .  tiZANidine (ZANAFLEX) 4 MG tablet, Take 1 tablet (4 mg total) by mouth every 6 (six) hours as needed for muscle spasms., Disp: 30 tablet, Rfl: 0 .  tiZANidine (ZANAFLEX) 4 MG tablet, Take 1 tablet (4 mg total) by mouth every 6 (six) hours as needed for muscle spasms., Disp: 30 tablet, Rfl: 0 .  traMADol (ULTRAM) 50 MG tablet, Take 1 tablet (50 mg total) by mouth every 6 (six) hours as needed., Disp: 15 tablet, Rfl: 0  Current Facility-Administered Medications:  .  nicotine (NICODERM CQ - dosed in mg/24 hr) patch 14 mg, 14 mg, Transdermal, Daily,  Carlis Abbott, Laura P, DO .  nicotine polacrilex (NICORETTE) gum 2 mg, 2 mg, Oral, PRN, [name], DO  Allergies  Allergen Reactions  . Doxycycline Hives and Itching  . Flagyl [Metronidazole] Hives and Itching    Benadryl     Objective:   There were no vitals taken for this visit.  Patient is well-developed, well-nourished in no acute distress.  Resting comfortably at home.  Head is normocephalic, atraumatic.  No labored breathing.  Speech is clear and coherent with logical content.  Patient is alert and oriented at baseline.   Assessment and Plan:   1. Chronic rhinitis No fever, sinus pain, ear pain or tooth pain, or change in nasal drainage to indicate concern for sinusitis.  Suspect worsening of allergies due to recent weather change and being out of her medication for the past couple of weeks.  We will have her start Xyzal (prescription sent to pharmacy) and Flonase refilled so she can resume this.  She has an appointment with new PCP this coming week.  She will follow-up with them regarding any lingering symptoms.  Discussed in the interim time if she notes any new or worsening symptoms, to please let us know as we would need to reevaluate.  Patient voiced understanding and agreement with plan.    Ellen Rio, PA-C 04/06/2021

## 2021-04-15 ENCOUNTER — Other Ambulatory Visit: Payer: Self-pay | Admitting: Internal Medicine

## 2021-04-15 DIAGNOSIS — D120 Benign neoplasm of cecum: Secondary | ICD-10-CM | POA: Diagnosis not present

## 2021-04-15 DIAGNOSIS — E7849 Other hyperlipidemia: Secondary | ICD-10-CM | POA: Diagnosis not present

## 2021-04-15 DIAGNOSIS — M47812 Spondylosis without myelopathy or radiculopathy, cervical region: Secondary | ICD-10-CM | POA: Diagnosis not present

## 2021-04-15 DIAGNOSIS — E559 Vitamin D deficiency, unspecified: Secondary | ICD-10-CM | POA: Diagnosis not present

## 2021-04-15 DIAGNOSIS — Z0000 Encounter for general adult medical examination without abnormal findings: Secondary | ICD-10-CM | POA: Diagnosis not present

## 2021-04-15 DIAGNOSIS — E1142 Type 2 diabetes mellitus with diabetic polyneuropathy: Secondary | ICD-10-CM | POA: Diagnosis not present

## 2021-04-15 DIAGNOSIS — J302 Other seasonal allergic rhinitis: Secondary | ICD-10-CM | POA: Diagnosis not present

## 2021-04-15 DIAGNOSIS — M797 Fibromyalgia: Secondary | ICD-10-CM | POA: Diagnosis not present

## 2021-04-15 DIAGNOSIS — I10 Essential (primary) hypertension: Secondary | ICD-10-CM | POA: Diagnosis not present

## 2021-04-15 DIAGNOSIS — E2839 Other primary ovarian failure: Secondary | ICD-10-CM | POA: Diagnosis not present

## 2021-04-15 DIAGNOSIS — Z79899 Other long term (current) drug therapy: Secondary | ICD-10-CM | POA: Diagnosis not present

## 2021-04-16 ENCOUNTER — Other Ambulatory Visit: Payer: Self-pay | Admitting: Internal Medicine

## 2021-04-16 ENCOUNTER — Telehealth: Payer: Medicaid Other | Admitting: Physician Assistant

## 2021-04-16 DIAGNOSIS — G894 Chronic pain syndrome: Secondary | ICD-10-CM | POA: Diagnosis not present

## 2021-04-16 DIAGNOSIS — E2839 Other primary ovarian failure: Secondary | ICD-10-CM

## 2021-04-16 LAB — COMPLETE METABOLIC PANEL WITH GFR
AG Ratio: 1.6 (calc) (ref 1.0–2.5)
ALT: 9 U/L (ref 6–29)
AST: 12 U/L (ref 10–35)
Albumin: 4.2 g/dL (ref 3.6–5.1)
Alkaline phosphatase (APISO): 84 U/L (ref 37–153)
BUN: 10 mg/dL (ref 7–25)
CO2: 23 mmol/L (ref 20–32)
Calcium: 9.5 mg/dL (ref 8.6–10.4)
Chloride: 102 mmol/L (ref 98–110)
Creat: 0.69 mg/dL (ref 0.50–1.05)
GFR, Est African American: 114 mL/min/{1.73_m2} (ref >=60)
GFR, Est Non African American: 99 mL/min/{1.73_m2} (ref >=60)
Globulin: 2.7 g/dL (calc) (ref 1.9–3.7)
Glucose, Bld: 179 mg/dL — ABNORMAL HIGH (ref 65–99)
Potassium: 4.4 mmol/L (ref 3.5–5.3)
Sodium: 139 mmol/L (ref 135–146)
Total Bilirubin: 0.4 mg/dL (ref 0.2–1.2)
Total Protein: 6.9 g/dL (ref 6.1–8.1)

## 2021-04-16 LAB — LIPID PANEL
Cholesterol: 271 mg/dL — ABNORMAL HIGH (ref <200)
HDL: 55 mg/dL (ref >=50)
LDL Cholesterol (Calc): 173 mg/dL (calc) — ABNORMAL HIGH
Non-HDL Cholesterol (Calc): 216 mg/dL (calc) — ABNORMAL HIGH (ref <130)
Total CHOL/HDL Ratio: 4.9 (calc) (ref <5.0)
Triglycerides: 263 mg/dL — ABNORMAL HIGH (ref <150)

## 2021-04-16 LAB — CBC
HCT: 38.6 % (ref 35.0–45.0)
Hemoglobin: 12.6 g/dL (ref 11.7–15.5)
MCH: 27.3 pg (ref 27.0–33.0)
MCHC: 32.6 g/dL (ref 32.0–36.0)
MCV: 83.7 fL (ref 80.0–100.0)
MPV: 10.7 fL (ref 7.5–12.5)
Platelets: 381 10*3/uL (ref 140–400)
RBC: 4.61 10*6/uL (ref 3.80–5.10)
RDW: 12.2 % (ref 11.0–15.0)
WBC: 6.9 10*3/uL (ref 3.8–10.8)

## 2021-04-16 LAB — VITAMIN D 25 HYDROXY (VIT D DEFICIENCY, FRACTURES): Vit D, 25-Hydroxy: 33 ng/mL (ref 30–100)

## 2021-04-16 LAB — FOLATE: Folate: 10.8 ng/mL

## 2021-04-16 LAB — TSH: TSH: 1.18 mIU/L

## 2021-04-16 LAB — VITAMIN B12: Vitamin B-12: 271 pg/mL (ref 200–1100)

## 2021-04-16 NOTE — Progress Notes (Signed)
[name], Ellen Avery are scheduled for a virtual visit with your provider today.    Just as we do with appointments in the office, we must obtain your consent to participate.  Your consent will be active for this visit and any virtual visit you may have with one of our providers in the next 365 days.    If you have a MyChart account, I can also send a copy of this consent to you electronically.  All virtual visits are billed to your insurance company just like a traditional visit in the office.  As this is a virtual visit, video technology does not allow for your provider to perform a traditional examination.  This may limit your provider's ability to fully assess your condition.  If your provider identifies any concerns that need to be evaluated in person or the need to arrange testing such as labs, EKG, etc, we will make arrangements to do so.    Although advances in technology are sophisticated, we cannot ensure that it will always work on either your end or our end.  If the connection with a video visit is poor, we may have to switch to a telephone visit.  With either a video or telephone visit, we are not always able to ensure that we have a secure connection.   I need to obtain your verbal consent now.   Are you willing to proceed with your visit today?   IOLANDA FOLSON has provided verbal consent on 04/06/2021 for a virtual visit (video or telephone).  [name], PA-C 04/16/2021  2:42 PM  Virtual Visit via Video   I connected with patient on 04/16/21 at  2:45 PM EDT by a video enabled telemedicine application and verified that I am speaking with the correct person using two identifiers.  Location patient: Home Location provider: [place] participating in the virtual visit: Patient, Provider  I discussed the limitations of evaluation and management by telemedicine and the availability of in person appointments. The patient expressed understanding and agreed to  proceed.  Subjective:   HPI:  Patient presents via [facility] today c.o ongoing and worsening pain of her neck and legs. Patient is currently managed by Orthopedic Surgery who have her on a regimen of Gabapentin TID, steroid injections and Cymbalta, as well as use of OTC Tylenol. She notes she is waiting on getting in with a pain specialist and also scheduled for a bone density scan. States her pain is about 6-7/10 and her medications are not helping. She would like to discuss options for pain control.    ROS:  See pertinent positives and negatives per HPI.  Patient Active Problem List   Diagnosis Date Noted  . Chest pain in adult 07/20/2019  . IDDM (insulin dependent diabetes mellitus) 07/08/2018  . HTN (hypertension) 07/08/2018  . HLD (hyperlipidemia) 07/08/2018  . Altered mental status 03/02/2016  . MDD (major depressive disorder), recurrent episode, moderate ([place]) 03/02/2016  . Psychoses ([place])     Social History   Tobacco Use  . Smoking status: Current Every Day Smoker    Packs/day: 1.00    Years: 35.00    Pack years: 35.00    Types: Cigarettes  . Smokeless tobacco: Never Used  Substance Use Topics  . Alcohol use: No    Current Outpatient Medications:  .  ACCU-CHEK AVIVA PLUS test strip, USE TO CHECK BLOOD SUGAR 4 TIMES DAILY, Disp: , Rfl: 2 .  albuterol (VENTOLIN HFA) 108 (90 Base)  MCG/ACT inhaler, Inhale 1-2 puffs into the lungs every 6 (six) hours as needed for wheezing or shortness of breath., Disp: 18 g, Rfl: 0 .  aspirin EC 81 MG tablet, Take 81 mg by mouth daily., Disp: , Rfl:  .  Cholecalciferol (VITAMIN D3) 3000 units TABS, Take 3,000 Units by mouth daily., Disp: , Rfl:  .  DULoxetine (CYMBALTA) 60 MG capsule, Take 1 capsule (60 mg total) by mouth daily., Disp: 14 capsule, Rfl: 0 .  fluticasone (FLONASE) 50 MCG/ACT nasal spray, Place 2 sprays into both nostrils daily., Disp: 16 g, Rfl: 0 .  gabapentin (NEURONTIN) 100 MG capsule, Take 1-2 capsules (100-200 mg  total) by mouth See admin instructions. Take 100 mg in the morning, 100 mg at lunch, and 200 mg in the evening, Disp: 60 capsule, Rfl: 0 .  hydrOXYzine (ATARAX/VISTARIL) 25 MG tablet, Take 1 tablet (25 mg total) by mouth every 6 (six) hours as needed for anxiety., Disp: 20 tablet, Rfl: 0 .  LANTUS SOLOSTAR 100 UNIT/ML Solostar Pen, SMARTSIG:55 Unit(s) SUB-Q Every Night, Disp: , Rfl:  .  levocetirizine (XYZAL) 5 MG tablet, Take 1 tablet (5 mg total) by mouth every evening., Disp: 30 tablet, Rfl: 0 .  losartan-hydrochlorothiazide (HYZAAR) 100-12.5 MG tablet, Take 1 tablet by mouth daily. , Disp: , Rfl:  .  metFORMIN (GLUCOPHAGE) 1000 MG tablet, Take 1,000 mg by mouth daily with breakfast. , Disp: , Rfl:  .  tiZANidine (ZANAFLEX) 4 MG tablet, Take 1 tablet (4 mg total) by mouth every 6 (six) hours as needed for muscle spasms., Disp: 30 tablet, Rfl: 0  Allergies  Allergen Reactions  . Doxycycline Hives and Itching  . Flagyl [Metronidazole] Hives and Itching    Benadryl     Objective:   There were no vitals taken for this visit.  Patient is well-developed, well-nourished in no acute distress.  Resting comfortably at home.  Head is normocephalic, atraumatic.  No labored breathing.  Speech is clear and coherent with logical content.  Patient is alert and oriented at baseline.   Assessment and Plan:   1. Chronic pain syndrome Discussed with her she needs to reach out to her treating provider for further guidance and evaluation until she can get her ordered imaging completed and see the pain specialist. She is already on SNRI and Gabapentin along with OTC tylenol ES and periodic steroid injections. Is diabetic so oral steroids need to be avoided. Discussed with her limitations of Telehealth in terms of prescribing. She needs to reach out to her current providers for adjustments in current regimen or be seen at [facility] for acutely worsening symptoms if her current providers cannot see her as we  cannot make adjustments in chronic regimen or prescribe opioid medications via this platform.     [name], PA-C 04/16/2021

## 2021-04-22 DIAGNOSIS — I10 Essential (primary) hypertension: Secondary | ICD-10-CM | POA: Diagnosis not present

## 2021-04-22 DIAGNOSIS — E7849 Other hyperlipidemia: Secondary | ICD-10-CM | POA: Diagnosis not present

## 2021-04-22 DIAGNOSIS — E1142 Type 2 diabetes mellitus with diabetic polyneuropathy: Secondary | ICD-10-CM | POA: Diagnosis not present

## 2021-05-07 DIAGNOSIS — I10 Essential (primary) hypertension: Secondary | ICD-10-CM | POA: Diagnosis not present

## 2021-05-07 DIAGNOSIS — E1142 Type 2 diabetes mellitus with diabetic polyneuropathy: Secondary | ICD-10-CM | POA: Diagnosis not present

## 2021-05-07 DIAGNOSIS — J302 Other seasonal allergic rhinitis: Secondary | ICD-10-CM | POA: Diagnosis not present

## 2021-05-09 ENCOUNTER — Emergency Department (HOSPITAL_COMMUNITY)
Admission: EM | Admit: 2021-05-09 | Discharge: 2021-05-09 | Discharge status: Against Medical Advice | Payer: Medicaid Other | Attending: Emergency Medicine | Admitting: Emergency Medicine

## 2021-05-09 ENCOUNTER — Other Ambulatory Visit: Payer: Self-pay

## 2021-05-09 DIAGNOSIS — F17210 Nicotine dependence, cigarettes, uncomplicated: Secondary | ICD-10-CM | POA: Insufficient documentation

## 2021-05-09 DIAGNOSIS — R0981 Nasal congestion: Secondary | ICD-10-CM | POA: Insufficient documentation

## 2021-05-09 DIAGNOSIS — I10 Essential (primary) hypertension: Secondary | ICD-10-CM | POA: Insufficient documentation

## 2021-05-09 DIAGNOSIS — Z79899 Other long term (current) drug therapy: Secondary | ICD-10-CM | POA: Diagnosis not present

## 2021-05-09 DIAGNOSIS — Z7982 Long term (current) use of aspirin: Secondary | ICD-10-CM | POA: Insufficient documentation

## 2021-05-09 DIAGNOSIS — M542 Cervicalgia: Secondary | ICD-10-CM | POA: Insufficient documentation

## 2021-05-09 DIAGNOSIS — G4489 Other headache syndrome: Secondary | ICD-10-CM | POA: Diagnosis not present

## 2021-05-09 DIAGNOSIS — R519 Headache, unspecified: Secondary | ICD-10-CM | POA: Diagnosis not present

## 2021-05-09 DIAGNOSIS — R5381 Other malaise: Secondary | ICD-10-CM | POA: Diagnosis not present

## 2021-05-09 DIAGNOSIS — Z7984 Long term (current) use of oral hypoglycemic drugs: Secondary | ICD-10-CM | POA: Insufficient documentation

## 2021-05-09 DIAGNOSIS — E119 Type 2 diabetes mellitus without complications: Secondary | ICD-10-CM | POA: Insufficient documentation

## 2021-05-09 MED ORDER — SODIUM CHLORIDE 0.9 % IV BOLUS
500.0000 mL | Freq: Once | INTRAVENOUS | Status: AC
  Administered 2021-05-09: 500 mL via INTRAVENOUS

## 2021-05-09 MED ORDER — KETOROLAC TROMETHAMINE 30 MG/ML IJ SOLN
30.0000 mg | Freq: Once | INTRAMUSCULAR | Status: AC
  Administered 2021-05-09: 30 mg via INTRAMUSCULAR
  Filled 2021-05-09: qty 1

## 2021-05-09 MED ORDER — METOCLOPRAMIDE HCL 5 MG/ML IJ SOLN
10.0000 mg | Freq: Once | INTRAMUSCULAR | Status: AC
  Administered 2021-05-09: 10 mg via INTRAVENOUS
  Filled 2021-05-09 (×2): qty 2

## 2021-05-09 NOTE — ED Triage Notes (Signed)
Pt arrived via [facility]. Headache and neck pain for 3-4 days that radiates down to back.   164/108, 84HR, 97%RA, BS 135

## 2021-05-09 NOTE — Discharge Instructions (Addendum)
You were seen today for headache.  You can take Tylenol and Motrin as needed for the pain.  If you experience changes in vision, vomiting, fever please return back to the ED for evaluation.  Please call to [facility] health and wellness center to establish care with a primary care provider.

## 2021-05-09 NOTE — ED Provider Notes (Signed)
[facility] EMERGENCY DEPARTMENT Provider Note   CSN: [id number] Arrival date & time: 05/09/21  1038     History Chief Complaint  Patient presents with  . Headache    Pt arrived via PT[facility]. Headache and neck pain for 3-4 days that radiates down to back.   164/108, 84HR, 97%RA, BS 135   . Shoulder Pain    Pt arrived via [facility]. Headache and neck pain for 3-4 days that radiates down to back.   164/108, 84HR, 97%RA, BS 135      Ellen Avery is a 55 y.o. female.  HPI   Patient presents with headache x 3-4 days.  The headache comes and goes.  No nausea, no vomiting, no photophobia or photophonia.  No recent head trauma or back trauma. She reports a history of frequent headaches. States she is always also having neck pain that started around the same time.  No fevers, no chills.  She complains of some congestion but also notes she has a history of allergies and is trying new allergy medication.  She denies any history of stroke, TIA, blood clot.  She smokes cigarettes every day, and is on medication for her blood pressure.    Past Medical History:  Diagnosis Date  . Anxiety   . Arthritis   . Chronic headaches   . DDD (degenerative disc disease), cervical   . Fibromyalgia   . GERD (gastroesophageal reflux disease)   . History of colon polyps   . History of psychosis   . Hypercholesteremia   . Hypertension   . IBS (irritable bowel syndrome)   . MDD (major depressive disorder)   . Right sided facial pain   . Seasonal allergic rhinitis   . Sinus congestion    12-24-2018 per pt no fever  . Type 2 diabetes mellitus treated with insulin ([place])   . Vitamin D deficiency   . Wears dentures    full upper,  partial lower  . Wears glasses     Patient Active Problem List   Diagnosis Date Noted  . Chest pain in adult 07/20/2019  . IDDM (insulin dependent diabetes mellitus) 07/08/2018  . HTN (hypertension) 07/08/2018  . HLD (hyperlipidemia) 07/08/2018  . Altered  mental status 03/02/2016  . MDD (major depressive disorder), recurrent episode, moderate ([place]) 03/02/2016  . Psychoses [facility])     Past Surgical History:  Procedure Laterality Date  . ARTERY BIOPSY Right 12/28/2018   Procedure: RIGHT BIOPSY TEMPORAL ARTERY;  Surgeon: Kieth Brightly, Arta Bruce, MD;  Location: [facility];  Service: General;  Laterality: Right;  . CARDIAC CATHETERIZATION    . MULTIPLE TOOTH EXTRACTIONS  04/ 2019    sedation  . TOTAL ABDOMINAL HYSTERECTOMY W/ BILATERAL SALPINGOOPHORECTOMY  2009     OB History   No obstetric history on file.     Family History  Problem Relation Age of Onset  . Diabetes Mother   . Heart disease Paternal Grandmother   . Breast cancer Paternal Grandmother   . Diabetes Paternal Uncle   . Heart Problems Brother   . Breast cancer Maternal Aunt   . Head & neck cancer Cousin   . Cervical cancer Paternal Aunt   . Colon cancer Neg Hx   . Colon polyps Neg Hx   . Esophageal cancer Neg Hx   . Rectal cancer Neg Hx   . Stomach cancer Neg Hx     Social History   Tobacco Use  . Smoking status: Current Every  Day Smoker    Packs/day: 1.00    Years: 35.00    Pack years: 35.00    Types: Cigarettes  . Smokeless tobacco: Never Used  Vaping Use  . Vaping Use: Never used  Substance Use Topics  . Alcohol use: No  . Drug use: No    Home Medications Prior to Admission medications   Medication Sig Start Date End Date Taking? Authorizing Provider  ACCU-CHEK AVIVA PLUS test strip USE TO CHECK BLOOD SUGAR 4 TIMES DAILY 05/25/18   [provider]  albuterol (VENTOLIN HFA) 108 (90 Base) MCG/ACT inhaler Inhale 1-2 puffs into the lungs every 6 (six) hours as needed for wheezing or shortness of breath. 02/05/21   [name], NP  aspirin EC 81 MG tablet Take 81 mg by mouth daily.    [provider]  Cholecalciferol (VITAMIN D3) 3000 units TABS Take 3,000 Units by mouth daily.    [provider]  DULoxetine  (CYMBALTA) 60 MG capsule Take 1 capsule (60 mg total) by mouth daily. 04/03/20   Sharion Balloon, NP  fluticasone (FLONASE) 50 MCG/ACT nasal spray Place 2 sprays into both nostrils daily. 04/06/21   Brunetta Jeans, PA-C  gabapentin (NEURONTIN) 100 MG capsule Take 1-2 capsules (100-200 mg total) by mouth See admin instructions. Take 100 mg in the morning, 100 mg at lunch, and 200 mg in the evening 04/03/20   Sharion Balloon, NP  hydrOXYzine (ATARAX/VISTARIL) 25 MG tablet Take 1 tablet (25 mg total) by mouth every 6 (six) hours as needed for anxiety. 09/13/18   [name], [name], MD  LANTUS SOLOSTAR 100 UNIT/ML Solostar Pen SMARTSIG:55 Unit(s) SUB-Q Every Night 12/12/19   [provider]  levocetirizine (XYZAL) 5 MG tablet Take 1 tablet (5 mg total) by mouth every evening. 04/06/21   Brunetta Jeans, PA-C  losartan-hydrochlorothiazide (HYZAAR) 100-12.5 MG tablet Take 1 tablet by mouth daily.  11/01/18   [provider]  metFORMIN (GLUCOPHAGE) 1000 MG tablet Take 1,000 mg by mouth daily with breakfast.     [provider]  tiZANidine (ZANAFLEX) 4 MG tablet Take 1 tablet (4 mg total) by mouth every 6 (six) hours as needed for muscle spasms. 01/06/21   Jacquelin Hawking, NP    Allergies    Doxycycline and Flagyl [metronidazole]  Review of Systems   Review of Systems  Constitutional: Negative for chills and fever.  HENT: Negative for ear pain and sore throat.   Eyes: Negative for pain and visual disturbance.  Respiratory: Negative for cough and shortness of breath.   Cardiovascular: Negative for chest pain and palpitations.  Gastrointestinal: Negative for abdominal pain and vomiting.  Genitourinary: Negative for dysuria and hematuria.  Musculoskeletal: Positive for back pain and gait problem. Negative for arthralgias.  Skin: Negative for color change and rash.  Neurological: Negative for seizures and syncope.  All other systems reviewed and are negative.   Physical  Exam Updated Vital Signs BP (!) 133/94 (BP Location: Right Arm)   Pulse 74   Temp 98.5 F (36.9 C) (Oral)   Resp 17   Ht 5\' 6"  (1.676 m)   Wt 101.2 kg   SpO2 100%   BMI 35.99 kg/m   Physical Exam Vitals and nursing note reviewed. Exam conducted with a chaperone present.  Constitutional:      Appearance: Normal appearance.  HENT:     Head: Normocephalic and atraumatic.  Eyes:     General: No scleral icterus.  Right eye: No discharge.        Left eye: No discharge.     Extraocular Movements: Extraocular movements intact.     Pupils: Pupils are equal, round, and reactive to light.  Neck:     Meningeal: Kernig's sign absent.  Cardiovascular:     Rate and Rhythm: Normal rate and regular rhythm.     Pulses: Normal pulses.     Heart sounds: Normal heart sounds. No murmur heard. No friction rub. No gallop.   Pulmonary:     Effort: Pulmonary effort is normal. No respiratory distress.     Breath sounds: Normal breath sounds.  Abdominal:     General: Abdomen is flat. Bowel sounds are normal. There is no distension.     Palpations: Abdomen is soft.     Tenderness: There is no abdominal tenderness.  Musculoskeletal:     Cervical back: Normal range of motion. No rigidity.  Skin:    General: Skin is warm and dry.     Coloration: Skin is not jaundiced.  Neurological:     Mental Status: She is alert. Mental status is at baseline.     Cranial Nerves: No cranial nerve deficit, dysarthria or facial asymmetry.     Coordination: Romberg sign negative. Coordination normal.  Psychiatric:        Mood and Affect: Mood normal.     ED Results / Procedures / Treatments   Labs (all labs ordered are listed, but only abnormal results are displayed) Labs Reviewed - No data to display  EKG None  Radiology No results found.  Procedures Procedures   Medications Ordered in ED Medications - No data to display  ED Course  I have reviewed the triage vital signs and the nursing  notes.  Pertinent labs & imaging results that were available during my care of the patient were reviewed by me and considered in my medical decision making (see chart for details).    MDM Rules/Calculators/A&P                         Patient is a 55 year old female presenting with headache. Vitals are stable, nontoxic appearing.  No head trauma, no focal deficits on exam. Low suspicion for SAH, meningitis, CNS infection or emergent etiology.   She has history of headaches. Imaging is not warranted on this time based on history and my physical exam. Will treat symptomatically.   Patient reports improvement of pain and requests discharge. Return precautions given. Advised follow up with PCP. Patient agreeable to plan.   Final Clinical Impression(s) / ED Diagnoses Final diagnoses:  None    Rx / DC Orders ED Discharge Orders    None       Sherrill Raring, PA-C 05/09/21 1208    Malvin Johns, MD 05/09/21 [phone]

## 2021-05-20 ENCOUNTER — Ambulatory Visit (INDEPENDENT_AMBULATORY_CARE_PROVIDER_SITE_OTHER): Payer: Medicaid Other

## 2021-05-20 ENCOUNTER — Other Ambulatory Visit: Payer: Self-pay

## 2021-05-20 ENCOUNTER — Encounter (HOSPITAL_COMMUNITY): Payer: Self-pay

## 2021-05-20 ENCOUNTER — Ambulatory Visit (HOSPITAL_COMMUNITY)
Admission: EM | Admit: 2021-05-20 | Discharge: 2021-05-20 | Disposition: A | Discharge status: Home / Self Care | Payer: Medicaid Other | Attending: Emergency Medicine | Admitting: Emergency Medicine

## 2021-05-20 DIAGNOSIS — S92515A Nondisplaced fracture of proximal phalanx of left lesser toe(s), initial encounter for closed fracture: Secondary | ICD-10-CM

## 2021-05-20 DIAGNOSIS — M79672 Pain in left foot: Secondary | ICD-10-CM | POA: Diagnosis not present

## 2021-05-20 MED ORDER — HYDROCODONE-ACETAMINOPHEN 5-325 MG PO TABS: 2.0000 | ORAL_TABLET | ORAL | 0 refills | Status: DC | PRN

## 2021-05-20 MED ORDER — MELOXICAM 15 MG PO TABS: 15.0000 mg | ORAL_TABLET | Freq: Every day | ORAL | 0 refills | Status: DC

## 2021-05-20 MED ORDER — IBUPROFEN 800 MG PO TABS: 800.0000 mg | ORAL_TABLET | Freq: Four times a day (QID) | ORAL | 0 refills | Status: DC | PRN

## 2021-05-20 NOTE — ED Provider Notes (Signed)
[facility]  ____________________________________________  Time seen: Approximately 1:20 PM  I have reviewed the triage vital signs and the nursing notes.   HISTORY  Chief Complaint Fall and Leg Pain    HPI Ellen Avery is a 55 y.o. female who presents to urgent care complaining of left midfoot pain.  Patient states that she was walking down the sidewalk from church when she fell.  She hurts in multiple areas but her main complaint/concern is left midfoot pain.  Patient states that she cannot put weight on the foot but doing so increases the pain.  No history of previous injuries/fractures to this foot.  Over-the-counter medications have not alleviated symptoms.  [place] history as described below with no complaints of chronic medical issues.       Past Medical History:  Diagnosis Date   Anxiety    Arthritis    Chronic headaches    DDD (degenerative disc disease), cervical    Fibromyalgia    GERD (gastroesophageal reflux disease)    History of colon polyps    History of psychosis    Hypercholesteremia    Hypertension    IBS (irritable bowel syndrome)    MDD (major depressive disorder)    Right sided facial pain    Seasonal allergic rhinitis    Sinus congestion    12-24-2018 per pt no fever   Type 2 diabetes mellitus treated with insulin ([place])    Vitamin D deficiency    Wears dentures    full upper,  partial lower   Wears glasses     Patient Active Problem List   Diagnosis Date Noted   Chest pain in adult 07/20/2019   IDDM (insulin dependent diabetes mellitus) 07/08/2018   HTN (hypertension) 07/08/2018   HLD (hyperlipidemia) 07/08/2018   Altered mental status 03/02/2016   MDD (major depressive disorder), recurrent episode, moderate ([place]) 03/02/2016   Psychoses ([place])     Past Surgical History:  Procedure Laterality Date   ARTERY BIOPSY Right 12/28/2018   Procedure: RIGHT BIOPSY TEMPORAL ARTERY;  Surgeon: Kieth Brightly, Arta Bruce, MD;  Location: [place];  Service: General;  Laterality: Right;   CARDIAC CATHETERIZATION     MULTIPLE TOOTH EXTRACTIONS  04/ 2019    sedation   TOTAL ABDOMINAL HYSTERECTOMY W/ BILATERAL SALPINGOOPHORECTOMY  2009    Prior to Admission medications   Medication Sig Start Date End Date Taking? Authorizing Provider  HYDROcodone-acetaminophen (NORCO/VICODIN) 5-325 MG tablet Take 2 tablets by mouth every 4 (four) hours as needed. 05/20/21  Yes Dawnielle Christiana, Charline Bills, PA-C  ibuprofen (ADVIL) 800 MG tablet Take 1 tablet (800 mg total) by mouth every 6 (six) hours as needed for moderate pain. 05/20/21  Yes Inioluwa Baris, Charline Bills, PA-C  ACCU-CHEK AVIVA PLUS test strip USE TO CHECK BLOOD SUGAR 4 TIMES DAILY 05/25/18   [provider]  albuterol (VENTOLIN HFA) 108 (90 Base) MCG/ACT inhaler Inhale 1-2 puffs into the lungs every 6 (six) hours as needed for wheezing or shortness of breath. 02/05/21   [name], NP  aspirin EC 81 MG tablet Take 81 mg by mouth daily.    [provider]  Cholecalciferol (VITAMIN D3) 3000 units TABS Take 3,000 Units by mouth daily.    [provider]  DULoxetine (CYMBALTA) 60 MG capsule Take 1 capsule (60 mg total) by mouth daily. 04/03/20   Sharion Balloon, NP  fluticasone (FLONASE) 50 MCG/ACT nasal spray Place 2 sprays into both nostrils daily. 04/06/21  Brunetta Jeans, PA-C  gabapentin (NEURONTIN) 100 MG capsule Take 1-2 capsules (100-200 mg total) by mouth See admin instructions. Take 100 mg in the morning, 100 mg at lunch, and 200 mg in the evening 04/03/20   Sharion Balloon, NP  hydrOXYzine (ATARAX/VISTARIL) 25 MG tablet Take 1 tablet (25 mg total) by mouth every 6 (six) hours as needed for anxiety. 09/13/18   [name], [name], MD  LANTUS SOLOSTAR 100 UNIT/ML Solostar Pen SMARTSIG:55 Unit(s) SUB-Q Every Night 12/12/19   [provider]  levocetirizine (XYZAL) 5 MG tablet Take 1 tablet (5 mg total) by mouth every evening. 04/06/21   Brunetta Jeans,  PA-C  losartan-hydrochlorothiazide (HYZAAR) 100-12.5 MG tablet Take 1 tablet by mouth daily.  11/01/18   [provider]  metFORMIN (GLUCOPHAGE) 1000 MG tablet Take 1,000 mg by mouth daily with breakfast.     [provider]  tiZANidine (ZANAFLEX) 4 MG tablet Take 1 tablet (4 mg total) by mouth every 6 (six) hours as needed for muscle spasms. 01/06/21   Jacquelin Hawking, NP    Allergies Doxycycline and Flagyl [metronidazole]  Family History  Problem Relation Age of Onset   Diabetes Mother    Heart disease Paternal Grandmother    Breast cancer Paternal Grandmother    Diabetes Paternal Uncle    Heart Problems Brother    Breast cancer Maternal Aunt    Head & neck cancer Cousin    Cervical cancer Paternal Aunt    Colon cancer Neg Hx    Colon polyps Neg Hx    Esophageal cancer Neg Hx    Rectal cancer Neg Hx    Stomach cancer Neg Hx     Social History Social History   Tobacco Use   Smoking status: Every Day    Packs/day: 1.00    Years: 35.00    Pack years: 35.00    Types: Cigarettes   Smokeless tobacco: Never  Vaping Use   Vaping Use: Never used  Substance Use Topics   Alcohol use: No   Drug use: No     Review of Systems  Constitutional: No fever/chills Eyes: No visual changes. No discharge ENT: No upper respiratory complaints. Cardiovascular: no chest pain. Respiratory: no cough. No SOB. Gastrointestinal: No abdominal pain.  No nausea, no vomiting.  No diarrhea.  No constipation. Musculoskeletal: Positive for left foot pain/injury Skin: Negative for rash, abrasions, lacerations, ecchymosis. Neurological: Negative for headaches, focal weakness or numbness.  10 System ROS otherwise negative.  ____________________________________________   PHYSICAL EXAM:  VITAL SIGNS: ED Triage Vitals  Enc Vitals Group     BP 05/20/21 1246 120/88     Pulse Rate 05/20/21 1246 89     Resp 05/20/21 1246 18     Temp 05/20/21 1246 98.7 F (37.1 C)     Temp  Source 05/20/21 1246 Oral     SpO2 05/20/21 1246 100 %     Weight --      Height --      Head Circumference --      Peak Flow --      Pain Score 05/20/21 1243 8     Pain Loc --      Pain Edu? --      Excl. in [place]? --      Constitutional: Alert and oriented. Well appearing and in no acute distress. Eyes: Conjunctivae are normal. PERRL. EOMI. Head: Atraumatic. ENT:      Ears:  Nose: No congestion/rhinnorhea.      Mouth/Throat: Mucous membranes are moist.  Neck: No stridor.    Cardiovascular: Normal rate, regular rhythm. Normal S1 and S2.  Good peripheral circulation. Respiratory: Normal respiratory effort without tachypnea or retractions. Lungs CTAB. Good air entry to the bases with no decreased or absent breath sounds. Musculoskeletal: Full range of motion to all extremities. No gross deformities appreciated.  Visualization of the left foot reveals mild edema about the left midfoot.  No abrasions, lacerations, ecchymosis.  Patient is tender to palpation along the plantar and dorsal aspect over the tarsal metatarsal joint with the first, and second metatarsals.  Presents pedis pulse intact.  Sensation intact all digits.  Capillary refill less than 2 seconds all digits. Neurologic:  Normal speech and language. No gross focal neurologic deficits are appreciated.  Skin:  Skin is warm, dry and intact. No rash noted. Psychiatric: Mood and affect are normal. Speech and behavior are normal. Patient exhibits appropriate insight and judgement.   ____________________________________________   LABS (all labs ordered are listed, but only abnormal results are displayed)  Labs Reviewed - No data to display ____________________________________________  EKG   ____________________________________________  RADIOLOGY I personally viewed and evaluated these images as part of my medical decision making, as well as reviewing the written report by the radiologist.  ED Provider Interpretation:  Imaging reveals fracture of the base of the fourth proximal phalanx  DG Foot Complete Left  Result Date: 05/20/2021 CLINICAL DATA:  Fall.  Mid foot pain EXAM: LEFT FOOT - COMPLETE 3+ VIEW COMPARISON:  None. FINDINGS: Probable nondisplaced fracture at the base of the fourth proximal phalanx. No other fracture. No arthropathy. IMPRESSION: Probable nondisplaced fracture base of fourth proximal phalanx. Correlate with symptoms. Electronically Signed   By: Franchot Gallo M.D.   On: 05/20/2021 13:54    ____________________________________________    PROCEDURES  Procedure(s) performed:    Procedures    Medications - No data to display   ____________________________________________   INITIAL IMPRESSION / ASSESSMENT AND PLAN / ED COURSE  Pertinent labs & imaging results that were available during my care of the patient were reviewed by me and considered in my medical decision making (see chart for details).  Review of the [place] CSRS was performed in accordance of the [place] prior to dispensing any controlled drugs.           Patient's diagnosis is consistent with proximal phalanx fracture of the fourth digit left foot.  Patient presented to the emergency department after sustaining an injury to the foot.  Patient was tender over the fourth and fifth metatarsal region.  Imaging revealed fracture of the proximal phalanx of the fourth digit.  Patient will be placed in a postop shoe, prescriptions for symptom relief, follow-up with orthopedics as needed..  Patient is given ED precautions to return to the ED for any worsening or new symptoms.     ____________________________________________  FINAL CLINICAL IMPRESSION(S) / DIAGNOSES  Final diagnoses:  Nondisplaced fracture of proximal phalanx of left lesser toe(s), initial encounter for closed fracture      NEW MEDICATIONS STARTED DURING THIS VISIT:  ED Discharge Orders          Ordered    meloxicam (MOBIC) 15 MG tablet  Daily,    Status:  Discontinued        05/20/21 1410    HYDROcodone-acetaminophen (NORCO/VICODIN) 5-325 MG tablet  Every 4 hours PRN        05/20/21 1410  ibuprofen (ADVIL) 800 MG tablet  Every 6 hours PRN        05/20/21 1413                This chart was dictated using voice recognition software/Dragon. Despite best efforts to proofread, errors can occur which can change the meaning. Any change was purely unintentional.    Darletta Moll, PA-C 05/20/21 1414

## 2021-05-20 NOTE — ED Triage Notes (Signed)
Pt reports pain in the right foot and right ankle x 1 day. Reports she was walking and fell on the ground. Tylenol and ibuprofen gives no relief.

## 2021-05-27 ENCOUNTER — Other Ambulatory Visit: Payer: Self-pay | Admitting: Sports Medicine

## 2021-05-27 DIAGNOSIS — M542 Cervicalgia: Secondary | ICD-10-CM

## 2021-05-31 ENCOUNTER — Ambulatory Visit
Admission: RE | Admit: 2021-05-31 | Discharge: 2021-05-31 | Disposition: A | Discharge status: Home / Self Care | Payer: Medicaid Other | Source: Ambulatory Visit | Attending: Sports Medicine | Admitting: Sports Medicine

## 2021-05-31 ENCOUNTER — Other Ambulatory Visit: Payer: Self-pay

## 2021-05-31 ENCOUNTER — Other Ambulatory Visit: Payer: Medicaid Other

## 2021-05-31 DIAGNOSIS — M542 Cervicalgia: Secondary | ICD-10-CM | POA: Diagnosis not present

## 2021-05-31 MED ORDER — TRIAMCINOLONE ACETONIDE 40 MG/ML IJ SUSP (RADIOLOGY)
60.0000 mg | Freq: Once | INTRAMUSCULAR | Status: AC
  Administered 2021-05-31: 60 mg via EPIDURAL

## 2021-05-31 MED ORDER — IOPAMIDOL (ISOVUE-M 300) INJECTION 61%
1.0000 mL | Freq: Once | INTRAMUSCULAR | Status: AC | PRN
  Administered 2021-05-31: 1 mL via EPIDURAL

## 2021-05-31 NOTE — Discharge Instructions (Signed)

## 2021-06-19 ENCOUNTER — Telehealth: Payer: Medicaid Other | Admitting: Physician Assistant

## 2021-06-19 DIAGNOSIS — M5030 Other cervical disc degeneration, unspecified cervical region: Secondary | ICD-10-CM

## 2021-06-19 MED ORDER — CELECOXIB 100 MG PO CAPS: 100.0000 mg | ORAL_CAPSULE | Freq: Every day | ORAL | 0 refills | Status: DC

## 2021-06-19 NOTE — Progress Notes (Signed)
Virtual Visit Consent   Ellen Avery, you are scheduled for a virtual visit with a [facility] provider today.     Just as with appointments in the office, your consent must be obtained to participate.  Your consent will be active for this visit and any virtual visit you may have with one of our providers in the next 365 days.     If you have a MyChart account, a copy of this consent can be sent to you electronically.  All virtual visits are billed to your insurance company just like a traditional visit in the office.    As this is a virtual visit, video technology does not allow for your provider to perform a traditional examination.  This may limit your provider's ability to fully assess your condition.  If your provider identifies any concerns that need to be evaluated in person or the need to arrange testing (such as labs, EKG, etc.), we will make arrangements to do so.     Although advances in technology are sophisticated, we cannot ensure that it will always work on either your end or our end.  If the connection with a video visit is poor, the visit may have to be switched to a telephone visit.  With either a video or telephone visit, we are not always able to ensure that we have a secure connection.     I need to obtain your verbal consent now.   Are you willing to proceed with your visit today?    Ellen Avery has provided verbal consent on 06/19/2021 for a virtual visit (video or telephone).   [name], [place]   Date: 06/19/2021 3:33 PM   Virtual Visit via Video Note   I, [name], connected with  Ellen Avery  ([id number], 10/30/1966) on 06/19/21 at  3:15 PM EDT by a video-enabled telemedicine application and verified that I am speaking with the correct person using two identifiers.  Location: Patient: Virtual Visit Location Patient: Home Provider: Virtual Visit Location Provider: Home Office   I discussed the limitations of evaluation and management by  telemedicine and the availability of in person appointments. The patient expressed understanding and agreed to proceed.    History of Present Illness: Ellen Avery is a 55 y.o. who identifies as a female who was assigned female at birth, and is being seen today for flare up of cervicalgia. Patient with history of chronic neck pain secondary to DDD cervical spine. Is followed by Neurosurgery, Dr. Alfonso Ramus, with recent steroid injection on 05/31/2021. Notes she cannot tell that it is helping and within past few days has noted increase in pain levels of posterior neck. Notes is aching in nature and sometimes worse than others. Is unsure if recent rain is affecting. Denies anyt noted flare of her chronic fibromyalgia. Continues to take her Cymbalta 60 mg daily, Gabapentin 300 mg TID for her symptoms. Recently on Ibuprofen due to foot fracture but has stopped this due to improved foot pain and GI side effects. Also previously on Meloxicam which she noted helped with her neck pain but could not tolerate GI side effects. Denies any numbness, tingling in the posterior neck or upper extremities.   HPI: HPI  Problems:  Patient Active Problem List   Diagnosis Date Noted   Chest pain in adult 07/20/2019   IDDM (insulin dependent diabetes mellitus) 07/08/2018   HTN (hypertension) 07/08/2018   HLD (hyperlipidemia) 07/08/2018   Altered mental status 03/02/2016  MDD (major depressive disorder), recurrent episode, moderate ([place]) 03/02/2016   Psychoses ([place])     Allergies:  Allergies  Allergen Reactions   Doxycycline Hives and Itching   Flagyl [Metronidazole] Hives and Itching    Benadryl    Medications:  Current Outpatient Medications:    celecoxib (CELEBREX) 100 MG capsule, Take 1 capsule (100 mg total) by mouth daily., Disp: 15 capsule, Rfl: 0   ACCU-CHEK AVIVA PLUS test strip, USE TO CHECK BLOOD SUGAR 4 TIMES DAILY, Disp: , Rfl: 2   albuterol (VENTOLIN HFA) 108 (90 Base) MCG/ACT inhaler, Inhale 1-2  puffs into the lungs every 6 (six) hours as needed for wheezing or shortness of breath., Disp: 18 g, Rfl: 0   aspirin EC 81 MG tablet, Take 81 mg by mouth daily., Disp: , Rfl:    Cholecalciferol (VITAMIN D3) 3000 units TABS, Take 3,000 Units by mouth daily., Disp: , Rfl:    DULoxetine (CYMBALTA) 60 MG capsule, Take 1 capsule (60 mg total) by mouth daily., Disp: 14 capsule, Rfl: 0   fluticasone (FLONASE) 50 MCG/ACT nasal spray, Place 2 sprays into both nostrils daily., Disp: 16 g, Rfl: 0   gabapentin (NEURONTIN) 100 MG capsule, Take 1-2 capsules (100-200 mg total) by mouth See admin instructions. Take 100 mg in the morning, 100 mg at lunch, and 200 mg in the evening, Disp: 60 capsule, Rfl: 0   hydrOXYzine (ATARAX/VISTARIL) 25 MG tablet, Take 1 tablet (25 mg total) by mouth every 6 (six) hours as needed for anxiety., Disp: 20 tablet, Rfl: 0   LANTUS SOLOSTAR 100 UNIT/ML Solostar Pen, SMARTSIG:55 Unit(s) SUB-Q Every Night, Disp: , Rfl:    levocetirizine (XYZAL) 5 MG tablet, Take 1 tablet (5 mg total) by mouth every evening., Disp: 30 tablet, Rfl: 0   losartan-hydrochlorothiazide (HYZAAR) 100-12.5 MG tablet, Take 1 tablet by mouth daily. , Disp: , Rfl:    metFORMIN (GLUCOPHAGE) 1000 MG tablet, Take 1,000 mg by mouth daily with breakfast. , Disp: , Rfl:    tiZANidine (ZANAFLEX) 4 MG tablet, Take 1 tablet (4 mg total) by mouth every 6 (six) hours as needed for muscle spasms., Disp: 30 tablet, Rfl: 0  Observations/Objective: Patient is well-developed, well-nourished in no acute distress.  Resting comfortably at home.  Head is normocephalic, atraumatic.  No labored breathing. Speech is clear and coherent with logical content.  Patient is alert and oriented at baseline.  Normal ROM of neck on video examination.   Assessment and Plan: 1. DDD (degenerative disc disease), cervical No improvement with recent steroid injection. Flare of pain in past few days -- atraumatic. No radicular symptoms present.  Will have her continue current regimen, adding on a trial of generic celebrex 100 mg once daily. (Renal function normal/at baseline on recent CMP). Tylenol for breakthrough pain. She is to follow-up with her Neurosurgeon within 2 weeks for follow-up/further management. May need to consider further adjustment in her Gabapentin dose. ER precautions reviewed with patient.   Follow Up Instructions: I discussed the assessment and treatment plan with the patient. The patient was provided an opportunity to ask questions and all were answered. The patient agreed with the plan and demonstrated an understanding of the instructions.  A copy of instructions were sent to the patient via MyChart.  The patient was advised to call back or seek an in-person evaluation if the symptoms worsen or if the condition fails to improve as anticipated.  Time:  I spent 15 minutes with the patient via telehealth technology discussing  the above problems/concerns.    [name], PA-C

## 2021-06-19 NOTE — Patient Instructions (Signed)
Ellen Avery, thank you for joining [name], PA-C for today's virtual visit.  While this provider is not your primary care provider (PCP), if your PCP is located in our provider database this encounter information will be shared with them immediately following your visit.  Consent: (Patient) Ellen Avery provided verbal consent for this virtual visit at the beginning of the encounter.  Current Medications:  Current Outpatient Medications:    ACCU-CHEK AVIVA PLUS test strip, USE TO CHECK BLOOD SUGAR 4 TIMES DAILY, Disp: , Rfl: 2   albuterol (VENTOLIN HFA) 108 (90 Base) MCG/ACT inhaler, Inhale 1-2 puffs into the lungs every 6 (six) hours as needed for wheezing or shortness of breath., Disp: 18 g, Rfl: 0   aspirin EC 81 MG tablet, Take 81 mg by mouth daily., Disp: , Rfl:    Cholecalciferol (VITAMIN D3) 3000 units TABS, Take 3,000 Units by mouth daily., Disp: , Rfl:    DULoxetine (CYMBALTA) 60 MG capsule, Take 1 capsule (60 mg total) by mouth daily., Disp: 14 capsule, Rfl: 0   fluticasone (FLONASE) 50 MCG/ACT nasal spray, Place 2 sprays into both nostrils daily., Disp: 16 g, Rfl: 0   gabapentin (NEURONTIN) 100 MG capsule, Take 1-2 capsules (100-200 mg total) by mouth See admin instructions. Take 100 mg in the morning, 100 mg at lunch, and 200 mg in the evening, Disp: 60 capsule, Rfl: 0   HYDROcodone-acetaminophen (NORCO/VICODIN) 5-325 MG tablet, Take 2 tablets by mouth every 4 (four) hours as needed., Disp: 8 tablet, Rfl: 0   hydrOXYzine (ATARAX/VISTARIL) 25 MG tablet, Take 1 tablet (25 mg total) by mouth every 6 (six) hours as needed for anxiety., Disp: 20 tablet, Rfl: 0   ibuprofen (ADVIL) 800 MG tablet, Take 1 tablet (800 mg total) by mouth every 6 (six) hours as needed for moderate pain., Disp: 30 tablet, Rfl: 0   LANTUS SOLOSTAR 100 UNIT/ML Solostar Pen, SMARTSIG:55 Unit(s) SUB-Q Every Night, Disp: , Rfl:    levocetirizine (XYZAL) 5 MG tablet, Take 1 tablet (5 mg total) by mouth  every evening., Disp: 30 tablet, Rfl: 0   losartan-hydrochlorothiazide (HYZAAR) 100-12.5 MG tablet, Take 1 tablet by mouth daily. , Disp: , Rfl:    metFORMIN (GLUCOPHAGE) 1000 MG tablet, Take 1,000 mg by mouth daily with breakfast. , Disp: , Rfl:    tiZANidine (ZANAFLEX) 4 MG tablet, Take 1 tablet (4 mg total) by mouth every 6 (six) hours as needed for muscle spasms., Disp: 30 tablet, Rfl: 0   Medications ordered in this encounter:  No orders of the defined types were placed in this encounter.    *If you need refills on other medications prior to your next appointment, please contact your pharmacy*  Follow-Up: Call back or seek an in-person evaluation if the symptoms worsen or if the condition fails to improve as anticipated.  Other Instructions Please continue current medication regimen.  Stop use of any OTC Ibuprofen or the Meloxicam you were previously given due to GI upset. Start the Celebrex once daily with food. Tylenol Arthritis for breakthrough pain. Recommend heating pad to the neck for 10-15 minutes, a few times per day. You need to reach out to your specialist for follow-up within the next couple of weeks.  If there is any acute worsening of symptoms, you need in person evaluation either with Dr. Alfonso Ramus or at urgent care, or ER for severe symptoms.   If you have been instructed to have an in-person evaluation today at a local Urgent  Care facility, please use the link below. It will take you to a list of all of our available [facility], including address, phone number and hours of operation. Please do not delay care.  [facility]  If you or a family member do not have a primary care provider, use the link below to schedule a visit and establish care. When you choose a [facility] primary care physician or advanced practice provider, you gain a long-term partner in health. Find a Primary Care Provider  Learn more about [facility]'s in-office and  virtual care options: [facility]

## 2021-07-04 ENCOUNTER — Telehealth: Payer: Medicaid Other | Admitting: Physician Assistant

## 2021-07-04 DIAGNOSIS — J019 Acute sinusitis, unspecified: Secondary | ICD-10-CM | POA: Diagnosis not present

## 2021-07-04 DIAGNOSIS — B9689 Other specified bacterial agents as the cause of diseases classified elsewhere: Secondary | ICD-10-CM

## 2021-07-04 MED ORDER — AMOXICILLIN-POT CLAVULANATE 875-125 MG PO TABS: 1.0000 | ORAL_TABLET | Freq: Two times a day (BID) | ORAL | 0 refills | Status: DC

## 2021-07-04 NOTE — Patient Instructions (Signed)
[name], thank you for joining [name], PA-C for today's virtual visit.  While this provider is not your primary care provider (PCP), if your PCP is located in our provider database this encounter information will be shared with them immediately following your visit.  Consent: (Patient) Ellen Avery provided verbal consent for this virtual visit at the beginning of the encounter.  Current Medications:  Current Outpatient Medications:    ACCU-CHEK AVIVA PLUS test strip, USE TO CHECK BLOOD SUGAR 4 TIMES DAILY, Disp: , Rfl: 2   albuterol (VENTOLIN HFA) 108 (90 Base) MCG/ACT inhaler, Inhale 1-2 puffs into the lungs every 6 (six) hours as needed for wheezing or shortness of breath., Disp: 18 g, Rfl: 0   aspirin EC 81 MG tablet, Take 81 mg by mouth daily., Disp: , Rfl:    celecoxib (CELEBREX) 100 MG capsule, Take 1 capsule (100 mg total) by mouth daily., Disp: 15 capsule, Rfl: 0   Cholecalciferol (VITAMIN D3) 3000 units TABS, Take 3,000 Units by mouth daily., Disp: , Rfl:    DULoxetine (CYMBALTA) 60 MG capsule, Take 1 capsule (60 mg total) by mouth daily., Disp: 14 capsule, Rfl: 0   fluticasone (FLONASE) 50 MCG/ACT nasal spray, Place 2 sprays into both nostrils daily., Disp: 16 g, Rfl: 0   gabapentin (NEURONTIN) 100 MG capsule, Take 1-2 capsules (100-200 mg total) by mouth See admin instructions. Take 100 mg in the morning, 100 mg at lunch, and 200 mg in the evening, Disp: 60 capsule, Rfl: 0   hydrOXYzine (ATARAX/VISTARIL) 25 MG tablet, Take 1 tablet (25 mg total) by mouth every 6 (six) hours as needed for anxiety., Disp: 20 tablet, Rfl: 0   LANTUS SOLOSTAR 100 UNIT/ML Solostar Pen, SMARTSIG:55 Unit(s) SUB-Q Every Night, Disp: , Rfl:    levocetirizine (XYZAL) 5 MG tablet, Take 1 tablet (5 mg total) by mouth every evening., Disp: 30 tablet, Rfl: 0   losartan-hydrochlorothiazide (HYZAAR) 100-12.5 MG tablet, Take 1 tablet by mouth daily. , Disp: , Rfl:    metFORMIN (GLUCOPHAGE) 1000  MG tablet, Take 1,000 mg by mouth daily with breakfast. , Disp: , Rfl:    tiZANidine (ZANAFLEX) 4 MG tablet, Take 1 tablet (4 mg total) by mouth every 6 (six) hours as needed for muscle spasms., Disp: 30 tablet, Rfl: 0   Medications ordered in this encounter:  No orders of the defined types were placed in this encounter.    *If you need refills on other medications prior to your next appointment, please contact your pharmacy*  Follow-Up: Call back or seek an in-person evaluation if the symptoms worsen or if the condition fails to improve as anticipated.  Other Instructions Please take antibiotic as directed.  Increase fluid intake.  Use Saline nasal spray.  Take a daily multivitamin. Continue Flonase and Mucinex.  Place a humidifier in the bedroom.  Please call or return clinic if symptoms are not improving.  Sinusitis Sinusitis is redness, soreness, and swelling (inflammation) of the paranasal sinuses. Paranasal sinuses are air pockets within the bones of your face (beneath the eyes, the middle of the forehead, or above the eyes). In healthy paranasal sinuses, mucus is able to drain out, and air is able to circulate through them by way of your nose. However, when your paranasal sinuses are inflamed, mucus and air can become trapped. This can allow bacteria and other germs to grow and cause infection. Sinusitis can develop quickly and last only a short time (acute) or continue over a  long period (chronic). Sinusitis that lasts for more than 12 weeks is considered chronic.  CAUSES  Causes of sinusitis include: Allergies. Structural abnormalities, such as displacement of the cartilage that separates your nostrils (deviated septum), which can decrease the air flow through your nose and sinuses and affect sinus drainage. Functional abnormalities, such as when the small hairs (cilia) that line your sinuses and help remove mucus do not work properly or are not present. SYMPTOMS  Symptoms of acute  and chronic sinusitis are the same. The primary symptoms are pain and pressure around the affected sinuses. Other symptoms include: Upper toothache. Earache. Headache. Bad breath. Decreased sense of smell and taste. A cough, which worsens when you are lying flat. Fatigue. Fever. Thick drainage from your nose, which often is green and may contain pus (purulent). Swelling and warmth over the affected sinuses. DIAGNOSIS  Your caregiver will perform a physical exam. During the exam, your caregiver may: Look in your nose for signs of abnormal growths in your nostrils (nasal polyps). Tap over the affected sinus to check for signs of infection. View the inside of your sinuses (endoscopy) with a special imaging device with a light attached (endoscope), which is inserted into your sinuses. If your caregiver suspects that you have chronic sinusitis, one or more of the following tests may be recommended: Allergy tests. Nasal culture A sample of mucus is taken from your nose and sent to a lab and screened for bacteria. Nasal cytology A sample of mucus is taken from your nose and examined by your caregiver to determine if your sinusitis is related to an allergy. TREATMENT  Most cases of acute sinusitis are related to a viral infection and will resolve on their own within 10 days. Sometimes medicines are prescribed to help relieve symptoms (pain medicine, decongestants, nasal steroid sprays, or saline sprays).  However, for sinusitis related to a bacterial infection, your caregiver will prescribe antibiotic medicines. These are medicines that will help kill the bacteria causing the infection.  Rarely, sinusitis is caused by a fungal infection. In theses cases, your caregiver will prescribe antifungal medicine. For some cases of chronic sinusitis, surgery is needed. Generally, these are cases in which sinusitis recurs more than 3 times per year, despite other treatments. HOME CARE INSTRUCTIONS  Drink  plenty of water. Water helps thin the mucus so your sinuses can drain more easily. Use a humidifier. Inhale steam 3 to 4 times a day (for example, sit in the bathroom with the shower running). Apply a warm, moist washcloth to your face 3 to 4 times a day, or as directed by your caregiver. Use saline nasal sprays to help moisten and clean your sinuses. Take over-the-counter or prescription medicines for pain, discomfort, or fever only as directed by your caregiver. SEEK IMMEDIATE MEDICAL CARE IF: You have increasing pain or severe headaches. You have nausea, vomiting, or drowsiness. You have swelling around your face. You have vision problems. You have a stiff neck. You have difficulty breathing. MAKE SURE YOU:  Understand these instructions. Will watch your condition. Will get help right away if you are not doing well or get worse. Document Released: 11/24/2005 Document Revised: 02/16/2012 Document Reviewed: 12/09/2011 [facility] Patient Information 2014 [place], [place].    If you have been instructed to have an in-person evaluation today at a local Urgent Care facility, please use the link below. It will take you to a list of all of our available [facility], including address, phone number and hours of  operation. Please do not delay care.  [facility]  If you or a family member do not have a primary care provider, use the link below to schedule a visit and establish care. When you choose a [facility] primary care physician or advanced practice provider, you gain a long-term partner in health. Find a Primary Care Provider  Learn more about [facility]'s in-office and virtual care options: [facility]

## 2021-07-04 NOTE — Progress Notes (Signed)
Virtual Visit Consent   Ellen Avery, you are scheduled for a virtual visit with a [facility] provider today.     Just as with appointments in the office, your consent must be obtained to participate.  Your consent will be active for this visit and any virtual visit you may have with one of our providers in the next 365 days.     If you have a MyChart account, a copy of this consent can be sent to you electronically.  All virtual visits are billed to your insurance company just like a traditional visit in the office.    As this is a virtual visit, video technology does not allow for your provider to perform a traditional examination.  This may limit your provider's ability to fully assess your condition.  If your provider identifies any concerns that need to be evaluated in person or the need to arrange testing (such as labs, EKG, etc.), we will make arrangements to do so.     Although advances in technology are sophisticated, we cannot ensure that it will always work on either your end or our end.  If the connection with a video visit is poor, the visit may have to be switched to a telephone visit.  With either a video or telephone visit, we are not always able to ensure that we have a secure connection.     I need to obtain your verbal consent now.   Are you willing to proceed with your visit today?    Ellen Avery has provided verbal consent on 07/04/2021 for a virtual visit (video or telephone).   [name], [place]   Date: 07/04/2021 1:47 PM   Virtual Visit via Video Note   I, [name], connected with  Ellen Avery  ([id number], 1966-11-23) on 07/04/21 at  1:45 PM EDT by a video-enabled telemedicine application and verified that I am speaking with the correct person using two identifiers.  Location: Patient: Virtual Visit Location Patient: Home Provider: Virtual Visit Location Provider: Home Office   I discussed the limitations of evaluation and management by  telemedicine and the availability of in person appointments. The patient expressed understanding and agreed to proceed.    History of Present Illness: Ellen Avery is a 55 y.o. who identifies as a female who was assigned female at birth, and is being seen today for > 1 week of sinus pressure, sinus pain, fatigue and cough. Denies fever, chills, chest congestion, chest pain or SOB. Had negative COVID test. Is taking Mucinex, Xyzal and Flonase with only some relief of symptoms. Notes symptoms are not worsening at this point but are not improving.   HPI: HPI  Problems:  Patient Active Problem List   Diagnosis Date Noted   Chest pain in adult 07/20/2019   IDDM (insulin dependent diabetes mellitus) 07/08/2018   HTN (hypertension) 07/08/2018   HLD (hyperlipidemia) 07/08/2018   Altered mental status 03/02/2016   MDD (major depressive disorder), recurrent episode, moderate ([place]) 03/02/2016   Psychoses ([place])     Allergies:  Allergies  Allergen Reactions   Doxycycline Hives and Itching   Flagyl [Metronidazole] Hives and Itching    Benadryl    Medications:  Current Outpatient Medications:    amoxicillin-clavulanate (AUGMENTIN) 875-125 MG tablet, Take 1 tablet by mouth 2 (two) times daily., Disp: 14 tablet, Rfl: 0   ACCU-CHEK AVIVA PLUS test strip, USE TO CHECK BLOOD SUGAR 4 TIMES DAILY, Disp: , Rfl: 2  aspirin EC 81 MG tablet, Take 81 mg by mouth daily., Disp: , Rfl:    celecoxib (CELEBREX) 100 MG capsule, Take 1 capsule (100 mg total) by mouth daily., Disp: 15 capsule, Rfl: 0   Cholecalciferol (VITAMIN D3) 3000 units TABS, Take 3,000 Units by mouth daily., Disp: , Rfl:    DULoxetine (CYMBALTA) 60 MG capsule, Take 1 capsule (60 mg total) by mouth daily., Disp: 14 capsule, Rfl: 0   fluticasone (FLONASE) 50 MCG/ACT nasal spray, Place 2 sprays into both nostrils daily., Disp: 16 g, Rfl: 0   gabapentin (NEURONTIN) 100 MG capsule, Take 1-2 capsules (100-200 mg total) by mouth See admin  instructions. Take 100 mg in the morning, 100 mg at lunch, and 200 mg in the evening, Disp: 60 capsule, Rfl: 0   hydrOXYzine (ATARAX/VISTARIL) 25 MG tablet, Take 1 tablet (25 mg total) by mouth every 6 (six) hours as needed for anxiety., Disp: 20 tablet, Rfl: 0   LANTUS SOLOSTAR 100 UNIT/ML Solostar Pen, SMARTSIG:55 Unit(s) SUB-Q Every Night, Disp: , Rfl:    levocetirizine (XYZAL) 5 MG tablet, Take 1 tablet (5 mg total) by mouth every evening., Disp: 30 tablet, Rfl: 0   losartan-hydrochlorothiazide (HYZAAR) 100-12.5 MG tablet, Take 1 tablet by mouth daily. , Disp: , Rfl:    metFORMIN (GLUCOPHAGE) 1000 MG tablet, Take 1,000 mg by mouth daily with breakfast. , Disp: , Rfl:    tiZANidine (ZANAFLEX) 4 MG tablet, Take 1 tablet (4 mg total) by mouth every 6 (six) hours as needed for muscle spasms., Disp: 30 tablet, Rfl: 0  Observations/Objective: Patient is well-developed, well-nourished in no acute distress.  Resting comfortably at home.  Head is normocephalic, atraumatic.  No labored breathing. Speech is clear and coherent with logical content.  Patient is alert and oriented at baseline.   Assessment and Plan: 1. Acute bacterial sinusitis - amoxicillin-clavulanate (AUGMENTIN) 875-125 MG tablet; Take 1 tablet by mouth 2 (two) times daily.  Dispense: 14 tablet; Refill: 0 Rx Augmentin.  Increase fluids.  Rest.  Saline nasal spray.  Probiotic.  Mucinex as directed.  Humidifier in bedroom. Continue Flonase and Xyzal.  Call or return to clinic if symptoms are not improving.   Follow Up Instructions: I discussed the assessment and treatment plan with the patient. The patient was provided an opportunity to ask questions and all were answered. The patient agreed with the plan and demonstrated an understanding of the instructions.  A copy of instructions were sent to the patient via MyChart.  The patient was advised to call back or seek an in-person evaluation if the symptoms worsen or if the condition  fails to improve as anticipated.  Time:  I spent 12 minutes with the patient via telehealth technology discussing the above problems/concerns.    [name], PA-C

## 2021-07-08 DIAGNOSIS — M47812 Spondylosis without myelopathy or radiculopathy, cervical region: Secondary | ICD-10-CM | POA: Diagnosis not present

## 2021-07-08 DIAGNOSIS — J019 Acute sinusitis, unspecified: Secondary | ICD-10-CM | POA: Diagnosis not present

## 2021-07-08 DIAGNOSIS — E1142 Type 2 diabetes mellitus with diabetic polyneuropathy: Secondary | ICD-10-CM | POA: Diagnosis not present

## 2021-07-08 DIAGNOSIS — I10 Essential (primary) hypertension: Secondary | ICD-10-CM | POA: Diagnosis not present

## 2021-07-18 DIAGNOSIS — I10 Essential (primary) hypertension: Secondary | ICD-10-CM | POA: Diagnosis not present

## 2021-07-18 DIAGNOSIS — M542 Cervicalgia: Secondary | ICD-10-CM | POA: Diagnosis not present

## 2021-07-18 DIAGNOSIS — Z6836 Body mass index (BMI) 36.0-36.9, adult: Secondary | ICD-10-CM | POA: Diagnosis not present

## 2021-07-24 ENCOUNTER — Telehealth: Payer: Medicaid Other | Admitting: Physician Assistant

## 2021-07-24 DIAGNOSIS — J329 Chronic sinusitis, unspecified: Secondary | ICD-10-CM

## 2021-07-24 DIAGNOSIS — K047 Periapical abscess without sinus: Secondary | ICD-10-CM

## 2021-07-24 MED ORDER — IBUPROFEN 800 MG PO TABS: 800.0000 mg | ORAL_TABLET | Freq: Three times a day (TID) | ORAL | 0 refills | Status: DC | PRN

## 2021-07-24 MED ORDER — LEVOFLOXACIN 500 MG PO TABS: 500.0000 mg | ORAL_TABLET | Freq: Every day | ORAL | 0 refills | Status: AC

## 2021-07-24 NOTE — Progress Notes (Signed)
Virtual Visit Consent   Ellen Avery, you are scheduled for a virtual visit with a [facility] provider today.     Just as with appointments in the office, your consent must be obtained to participate.  Your consent will be active for this visit and any virtual visit you may have with one of our providers in the next 365 days.     If you have a MyChart account, a copy of this consent can be sent to you electronically.  All virtual visits are billed to your insurance company just like a traditional visit in the office.    As this is a virtual visit, video technology does not allow for your provider to perform a traditional examination.  This may limit your provider's ability to fully assess your condition.  If your provider identifies any concerns that need to be evaluated in person or the need to arrange testing (such as labs, EKG, etc.), we will make arrangements to do so.     Although advances in technology are sophisticated, we cannot ensure that it will always work on either your end or our end.  If the connection with a video visit is poor, the visit may have to be switched to a telephone visit.  With either a video or telephone visit, we are not always able to ensure that we have a secure connection.     I need to obtain your verbal consent now.   Are you willing to proceed with your visit today?    [name] has provided verbal consent on 07/24/2021 for a virtual visit (video or telephone).   [name], [place]   Date: 07/24/2021 1:26 PM   Virtual Visit via Video Note   I, [name], connected with  [name]  ([id number], [birth year]) on 07/24/21 at  1:15 PM EDT by a video-enabled telemedicine application and verified that I am speaking with the correct person using two identifiers.  Location: Patient: Virtual Visit Location Patient: Home Provider: Virtual Visit Location Provider: Home Office   I discussed the limitations of evaluation and management by  telemedicine and the availability of in person appointments. The patient expressed understanding and agreed to proceed.    History of Present Illness: Ellen Avery is a 55 y.o. who identifies as a female who was assigned female at birth, and is being seen today for recurrent sinusitis and possible dental infection. Patient was seen 2 weeks ago and treated for bacterial sinusitis with Augmentin by this provider. Notes initial improvement and mostly resolved symptoms that recurred within a few days of completing antibiotic. Now with maxillary sinus tenderness bilaterally. Also with a loose upper tooth next to partial with some swelling around the gums. Denies fever, chills, drainage from this area. Has taken tylenol and ibuprofen with only some improvement in pain. Has contacted her dentist and is still awaiting a response.    HPI: HPI  Problems:  Patient Active Problem List   Diagnosis Date Noted   Chest pain in adult 07/20/2019   IDDM (insulin dependent diabetes mellitus) 07/08/2018   HTN (hypertension) 07/08/2018   HLD (hyperlipidemia) 07/08/2018   Altered mental status 03/02/2016   MDD (major depressive disorder), recurrent episode, moderate ([place]) 03/02/2016   Psychoses ([place])     Allergies:  Allergies  Allergen Reactions   Doxycycline Hives and Itching   Flagyl [Metronidazole] Hives and Itching    Benadryl    Medications:  Current Outpatient Medications:  ibuprofen (ADVIL) 800 MG tablet, Take 1 tablet (800 mg total) by mouth every 8 (eight) hours as needed., Disp: 30 tablet, Rfl: 0   levofloxacin (LEVAQUIN) 500 MG tablet, Take 1 tablet (500 mg total) by mouth daily for 7 days., Disp: 7 tablet, Rfl: 0   ACCU-CHEK AVIVA PLUS test strip, USE TO CHECK BLOOD SUGAR 4 TIMES DAILY, Disp: , Rfl: 2   aspirin EC 81 MG tablet, Take 81 mg by mouth daily., Disp: , Rfl:    Cholecalciferol (VITAMIN D3) 3000 units TABS, Take 3,000 Units by mouth daily., Disp: , Rfl:    DULoxetine (CYMBALTA) 60  MG capsule, Take 1 capsule (60 mg total) by mouth daily., Disp: 14 capsule, Rfl: 0   fluticasone (FLONASE) 50 MCG/ACT nasal spray, Place 2 sprays into both nostrils daily., Disp: 16 g, Rfl: 0   gabapentin (NEURONTIN) 100 MG capsule, Take 1-2 capsules (100-200 mg total) by mouth See admin instructions. Take 100 mg in the morning, 100 mg at lunch, and 200 mg in the evening, Disp: 60 capsule, Rfl: 0   hydrOXYzine (ATARAX/VISTARIL) 25 MG tablet, Take 1 tablet (25 mg total) by mouth every 6 (six) hours as needed for anxiety., Disp: 20 tablet, Rfl: 0   LANTUS SOLOSTAR 100 UNIT/ML Solostar Pen, SMARTSIG:55 Unit(s) SUB-Q Every Night, Disp: , Rfl:    levocetirizine (XYZAL) 5 MG tablet, Take 1 tablet (5 mg total) by mouth every evening., Disp: 30 tablet, Rfl: 0   losartan-hydrochlorothiazide (HYZAAR) 100-12.5 MG tablet, Take 1 tablet by mouth daily. , Disp: , Rfl:    metFORMIN (GLUCOPHAGE) 1000 MG tablet, Take 1,000 mg by mouth daily with breakfast. , Disp: , Rfl:    tiZANidine (ZANAFLEX) 4 MG tablet, Take 1 tablet (4 mg total) by mouth every 6 (six) hours as needed for muscle spasms., Disp: 30 tablet, Rfl: 0  Observations/Objective: Patient is well-developed, well-nourished in no acute distress.  Resting comfortably at home.  Head is normocephalic, atraumatic.  No labored breathing. Speech is clear and coherent with logical content.  Patient is alert and oriented at baseline.   Assessment and Plan: 1. Dental infection - levofloxacin (LEVAQUIN) 500 MG tablet; Take 1 tablet (500 mg total) by mouth daily for 7 days.  Dispense: 7 tablet; Refill: 0 - ibuprofen (ADVIL) 800 MG tablet; Take 1 tablet (800 mg total) by mouth every 8 (eight) hours as needed.  Dispense: 30 tablet; Refill: 0 She is waiting on callback from dentist. Recent Augmentin with recurrent of sinusitis so do not want to restart. Concern for c diff with Clindamycin and want to use something to handle both issues. Per up-to-date recommendations  will start Levaquin (She is allergic to Doxycycline as well). Ibuprofen 800 for pain. Alternate with tylenol. Discussed will be up to dentist for further pain medication as we cannot prescribe controlled medication through this treatment avenue.   2. Recurrent sinusitis - levofloxacin (LEVAQUIN) 500 MG tablet; Take 1 tablet (500 mg total) by mouth daily for 7 days.  Dispense: 7 tablet; Refill: 0 Rx Levaquin.  Increase fluids.  Rest.  Saline nasal spray.  Probiotic.  Mucinex as directed.  Humidifier in bedroom. Restart Flonase.  Call or return to clinic if symptoms are not improving.   Follow Up Instructions: I discussed the assessment and treatment plan with the patient. The patient was provided an opportunity to ask questions and all were answered. The patient agreed with the plan and demonstrated an understanding of the instructions.  A copy of instructions were  sent to the patient via [facility].  The patient was advised to call back or seek an in-person evaluation if the symptoms worsen or if the condition fails to improve as anticipated.  Time:  I spent 15 minutes with the patient via telehealth technology discussing the above problems/concerns.    [name], PA-C

## 2021-08-14 DIAGNOSIS — E0842 Diabetes mellitus due to underlying condition with diabetic polyneuropathy: Secondary | ICD-10-CM | POA: Diagnosis not present

## 2021-08-14 DIAGNOSIS — I10 Essential (primary) hypertension: Secondary | ICD-10-CM | POA: Diagnosis not present

## 2021-08-14 DIAGNOSIS — E1142 Type 2 diabetes mellitus with diabetic polyneuropathy: Secondary | ICD-10-CM | POA: Diagnosis not present

## 2021-08-14 DIAGNOSIS — J302 Other seasonal allergic rhinitis: Secondary | ICD-10-CM | POA: Diagnosis not present

## 2021-08-23 DIAGNOSIS — Z0100 Encounter for examination of eyes and vision without abnormal findings: Secondary | ICD-10-CM | POA: Diagnosis not present

## 2021-08-23 DIAGNOSIS — E119 Type 2 diabetes mellitus without complications: Secondary | ICD-10-CM | POA: Diagnosis not present

## 2021-09-22 ENCOUNTER — Telehealth: Payer: Medicaid Other | Admitting: Nurse Practitioner

## 2021-09-22 DIAGNOSIS — J329 Chronic sinusitis, unspecified: Secondary | ICD-10-CM | POA: Diagnosis not present

## 2021-09-22 MED ORDER — AMOXICILLIN-POT CLAVULANATE 875-125 MG PO TABS: 1.0000 | ORAL_TABLET | Freq: Two times a day (BID) | ORAL | 0 refills | Status: AC

## 2021-09-22 NOTE — Progress Notes (Signed)
Virtual Visit Consent   Ellen Avery, you are scheduled for a virtual visit with a [facility] provider today.     Just as with appointments in the office, your consent must be obtained to participate.  Your consent will be active for this visit and any virtual visit you may have with one of our providers in the next 365 days.     If you have a MyChart account, a copy of this consent can be sent to you electronically.  All virtual visits are billed to your insurance company just like a traditional visit in the office.    As this is a virtual visit, video technology does not allow for your provider to perform a traditional examination.  This may limit your provider's ability to fully assess your condition.  If your provider identifies any concerns that need to be evaluated in person or the need to arrange testing (such as labs, EKG, etc.), we will make arrangements to do so.     Although advances in technology are sophisticated, we cannot ensure that it will always work on either your end or our end.  If the connection with a video visit is poor, the visit may have to be switched to a telephone visit.  With either a video or telephone visit, we are not always able to ensure that we have a secure connection.     I need to obtain your verbal consent now.   Are you willing to proceed with your visit today?    Ellen Avery has provided verbal consent on 09/22/2021 for a virtual visit (video or telephone).   [name], NP   Date: 09/22/2021 5:00 PM   Virtual Visit via Video Note   I, [name], connected with  Ellen Avery  ([id number], 11-19-1966) on 09/22/21 at  4:45 PM EDT by a video-enabled telemedicine application and verified that I am speaking with the correct person using two identifiers.  Location: Patient: Virtual Visit Location Patient: Home Provider: Virtual Visit Location Provider: Home Office   I discussed the limitations of evaluation and management by  telemedicine and the availability of in person appointments. The patient expressed understanding and agreed to proceed.    History of Present Illness: Ellen Avery is a 55 y.o. who identifies as a female who was assigned female at birth, and is being seen today for recurrent sinusitis.   HPI:  States her home has mold issues and despite taking zyrtec and benadryl sypmtoms of maxillary sinus pressure and congestion, fatigue and cough have persisted over the past week. She was treated with abx in July, august and currently for the same symptoms. States she was evaluated by ENT and they did not find any chronic bacterial sinusitis. She declines prednisone over abx today due to her DM.   Problems:  Patient Active Problem List   Diagnosis Date Noted   Chest pain in adult 07/20/2019   IDDM (insulin dependent diabetes mellitus) 07/08/2018   HTN (hypertension) 07/08/2018   HLD (hyperlipidemia) 07/08/2018   Altered mental status 03/02/2016   MDD (major depressive disorder), recurrent episode, moderate ([place]) 03/02/2016   Psychoses ([place])     Allergies:  Allergies  Allergen Reactions   Doxycycline Hives and Itching   Flagyl [Metronidazole] Hives and Itching    Benadryl    Medications:  Current Outpatient Medications:    amoxicillin-clavulanate (AUGMENTIN) 875-125 MG tablet, Take 1 tablet by mouth 2 (two) times daily for 7 days., Disp:  14 tablet, Rfl: 0   ACCU-CHEK AVIVA PLUS test strip, USE TO CHECK BLOOD SUGAR 4 TIMES DAILY, Disp: , Rfl: 2   aspirin EC 81 MG tablet, Take 81 mg by mouth daily., Disp: , Rfl:    Cholecalciferol (VITAMIN D3) 3000 units TABS, Take 3,000 Units by mouth daily., Disp: , Rfl:    DULoxetine (CYMBALTA) 60 MG capsule, Take 1 capsule (60 mg total) by mouth daily., Disp: 14 capsule, Rfl: 0   fluticasone (FLONASE) 50 MCG/ACT nasal spray, Place 2 sprays into both nostrils daily., Disp: 16 g, Rfl: 0   gabapentin (NEURONTIN) 100 MG capsule, Take 1-2 capsules (100-200 mg  total) by mouth See admin instructions. Take 100 mg in the morning, 100 mg at lunch, and 200 mg in the evening, Disp: 60 capsule, Rfl: 0   hydrOXYzine (ATARAX/VISTARIL) 25 MG tablet, Take 1 tablet (25 mg total) by mouth every 6 (six) hours as needed for anxiety., Disp: 20 tablet, Rfl: 0   ibuprofen (ADVIL) 800 MG tablet, Take 1 tablet (800 mg total) by mouth every 8 (eight) hours as needed., Disp: 30 tablet, Rfl: 0   LANTUS SOLOSTAR 100 UNIT/ML Solostar Pen, SMARTSIG:55 Unit(s) SUB-Q Every Night, Disp: , Rfl:    levocetirizine (XYZAL) 5 MG tablet, Take 1 tablet (5 mg total) by mouth every evening., Disp: 30 tablet, Rfl: 0   losartan-hydrochlorothiazide (HYZAAR) 100-12.5 MG tablet, Take 1 tablet by mouth daily. , Disp: , Rfl:    metFORMIN (GLUCOPHAGE) 1000 MG tablet, Take 1,000 mg by mouth daily with breakfast. , Disp: , Rfl:    tiZANidine (ZANAFLEX) 4 MG tablet, Take 1 tablet (4 mg total) by mouth every 6 (six) hours as needed for muscle spasms., Disp: 30 tablet, Rfl: 0  Observations/Objective: Patient is well-developed, well-nourished in no acute distress.  Resting comfortably  at home.  Head is normocephalic, atraumatic.  No labored breathing.  Speech is clear and coherent with logical content.  Patient is alert and oriented at baseline.    Assessment and Plan: 1. Recurrent sinusitis - amoxicillin-clavulanate (AUGMENTIN) 875-125 MG tablet; Take 1 tablet by mouth 2 (two) times daily for 7 days.  Dispense: 14 tablet; Refill: 0 Should follow up with ENT  Follow Up Instructions: I discussed the assessment and treatment plan with the patient. The patient was provided an opportunity to ask questions and all were answered. The patient agreed with the plan and demonstrated an understanding of the instructions.  A copy of instructions were sent to the patient via MyChart unless otherwise noted below.     The patient was advised to call back or seek an in-person evaluation if the symptoms worsen  or if the condition fails to improve as anticipated.  Time:  I spent 10 minutes with the patient via telehealth technology discussing the above problems/concerns.    [name], NP

## 2021-09-22 NOTE — Patient Instructions (Addendum)
[name], thank you for joining [name], NP for today's virtual visit.  While this provider is not your primary care provider (PCP), if your PCP is located in our provider database this encounter information will be shared with them immediately following your visit.  Consent: (Patient) Ellen Avery provided verbal consent for this virtual visit at the beginning of the encounter.  Current Medications:  Current Outpatient Medications:    amoxicillin-clavulanate (AUGMENTIN) 875-125 MG tablet, Take 1 tablet by mouth 2 (two) times daily for 7 days., Disp: 14 tablet, Rfl: 0   ACCU-CHEK AVIVA PLUS test strip, USE TO CHECK BLOOD SUGAR 4 TIMES DAILY, Disp: , Rfl: 2   aspirin EC 81 MG tablet, Take 81 mg by mouth daily., Disp: , Rfl:    Cholecalciferol (VITAMIN D3) 3000 units TABS, Take 3,000 Units by mouth daily., Disp: , Rfl:    DULoxetine (CYMBALTA) 60 MG capsule, Take 1 capsule (60 mg total) by mouth daily., Disp: 14 capsule, Rfl: 0   fluticasone (FLONASE) 50 MCG/ACT nasal spray, Place 2 sprays into both nostrils daily., Disp: 16 g, Rfl: 0   gabapentin (NEURONTIN) 100 MG capsule, Take 1-2 capsules (100-200 mg total) by mouth See admin instructions. Take 100 mg in the morning, 100 mg at lunch, and 200 mg in the evening, Disp: 60 capsule, Rfl: 0   hydrOXYzine (ATARAX/VISTARIL) 25 MG tablet, Take 1 tablet (25 mg total) by mouth every 6 (six) hours as needed for anxiety., Disp: 20 tablet, Rfl: 0   ibuprofen (ADVIL) 800 MG tablet, Take 1 tablet (800 mg total) by mouth every 8 (eight) hours as needed., Disp: 30 tablet, Rfl: 0   LANTUS SOLOSTAR 100 UNIT/ML Solostar Pen, SMARTSIG:55 Unit(s) SUB-Q Every Night, Disp: , Rfl:    levocetirizine (XYZAL) 5 MG tablet, Take 1 tablet (5 mg total) by mouth every evening., Disp: 30 tablet, Rfl: 0   losartan-hydrochlorothiazide (HYZAAR) 100-12.5 MG tablet, Take 1 tablet by mouth daily. , Disp: , Rfl:    metFORMIN (GLUCOPHAGE) 1000 MG tablet, Take 1,000 mg  by mouth daily with breakfast. , Disp: , Rfl:    tiZANidine (ZANAFLEX) 4 MG tablet, Take 1 tablet (4 mg total) by mouth every 6 (six) hours as needed for muscle spasms., Disp: 30 tablet, Rfl: 0   Medications ordered in this encounter:  Meds ordered this encounter  Medications   amoxicillin-clavulanate (AUGMENTIN) 875-125 MG tablet    Sig: Take 1 tablet by mouth 2 (two) times daily for 7 days.    Dispense:  14 tablet    Refill:  0    Order Specific Question:   Supervising Provider    Answer:   Sabra Heck, BRIAN [[id number]]     *If you need refills on other medications prior to your next appointment, please contact your pharmacy*  Follow-Up: Call back or seek an in-person evaluation if the symptoms worsen or if the condition fails to improve as anticipated.  Other Instructions Please keep in mind that 3 sinus infections within 3 months usually requires a referral to ENT.    If you have been instructed to have an in-person evaluation today at a local Urgent Care facility, please use the link below. It will take you to a list of all of our available [facility], including address, phone number and hours of operation. Please do not delay care.  [facility]  If you or a family member do not have a primary care provider, use the link  below to schedule a visit and establish care. When you choose a [facility] primary care physician or advanced practice provider, you gain a long-term partner in health. Find a Primary Care Provider  Learn more about [facility] in-office and virtual care options: [facility] E-Visit for Sinus Problems  We are sorry that you are not feeling well.  Here is how we plan to help!  Based on what you have shared with me it looks like you have sinusitis.  Sinusitis is inflammation and infection in the sinus cavities of the head.  Based on your presentation I believe you most likely have Acute Bacterial Sinusitis.  This is an  infection caused by bacteria and is treated with antibiotics. I have prescribed Augmentin 875mg /125mg  one tablet twice daily with food, for 7 days. You may use an oral decongestant such as Mucinex D or if you have glaucoma or high blood pressure use plain Mucinex. Saline nasal spray help and can safely be used as often as needed for congestion.  If you develop worsening sinus pain, fever or notice severe headache and vision changes, or if symptoms are not better after completion of antibiotic, please schedule an appointment with a health care provider.     Sinus infections are not as easily transmitted as other respiratory infection, however we still recommend that you avoid close contact with loved ones, especially the very young and elderly.  Remember to wash your hands thoroughly throughout the day as this is the number one way to prevent the spread of infection!  Home Care: Only take medications as instructed by your medical team. Complete the entire course of an antibiotic. Do not take these medications with alcohol. A steam or ultrasonic humidifier can help congestion.  You can place a towel over your head and breathe in the steam from hot water coming from a faucet. Avoid close contacts especially the very young and the elderly. Cover your mouth when you cough or sneeze. Always remember to wash your hands.  Get Help Right Away If: You develop worsening fever or sinus pain. You develop a severe head ache or visual changes. Your symptoms persist after you have completed your treatment plan.  Make sure you Understand these instructions. Will watch your condition. Will get help right away if you are not doing well or get worse.  Thank you for choosing an e-visit.  Your e-visit answers were reviewed by a board certified advanced clinical practitioner to complete your personal care plan. Depending upon the condition, your plan could have included both over the counter or prescription  medications.  Please review your pharmacy choice. Make sure the pharmacy is open so you can pick up prescription now. If there is a problem, you may contact your provider through [employer] and have the prescription routed to another pharmacy.  Your safety is important to [place]. If you have drug allergies check your prescription carefully.   For the next 24 hours you can use MyChart to ask questions about today's visit, request a non-urgent call back, or ask for a work or school excuse. You will get an email in the next two days asking about your experience. I hope that your e-visit has been valuable and will speed your recovery.

## 2021-10-03 ENCOUNTER — Other Ambulatory Visit: Payer: Medicaid Other

## 2021-10-08 ENCOUNTER — Other Ambulatory Visit: Payer: Self-pay

## 2021-10-08 ENCOUNTER — Encounter (HOSPITAL_COMMUNITY): Payer: Self-pay | Admitting: Emergency Medicine

## 2021-10-08 ENCOUNTER — Ambulatory Visit (HOSPITAL_COMMUNITY)
Admission: EM | Admit: 2021-10-08 | Discharge: 2021-10-08 | Disposition: A | Discharge status: Home / Self Care | Payer: Medicaid Other | Attending: Student | Admitting: Student

## 2021-10-08 DIAGNOSIS — E1169 Type 2 diabetes mellitus with other specified complication: Secondary | ICD-10-CM | POA: Diagnosis not present

## 2021-10-08 DIAGNOSIS — J069 Acute upper respiratory infection, unspecified: Secondary | ICD-10-CM | POA: Insufficient documentation

## 2021-10-08 DIAGNOSIS — Z794 Long term (current) use of insulin: Secondary | ICD-10-CM | POA: Insufficient documentation

## 2021-10-08 DIAGNOSIS — E118 Type 2 diabetes mellitus with unspecified complications: Secondary | ICD-10-CM | POA: Diagnosis not present

## 2021-10-08 DIAGNOSIS — Z1152 Encounter for screening for COVID-19: Secondary | ICD-10-CM | POA: Diagnosis not present

## 2021-10-08 LAB — POC INFLUENZA A AND B ANTIGEN (URGENT CARE ONLY)
INFLUENZA A ANTIGEN, POC: NEGATIVE
INFLUENZA B ANTIGEN, POC: NEGATIVE

## 2021-10-08 MED ORDER — PREDNISONE 20 MG PO TABS: 20.0000 mg | ORAL_TABLET | Freq: Every day | ORAL | 0 refills | Status: AC

## 2021-10-08 MED ORDER — LOPERAMIDE HCL 2 MG PO CAPS: 2.0000 mg | ORAL_CAPSULE | Freq: Four times a day (QID) | ORAL | 0 refills | Status: DC | PRN

## 2021-10-08 NOTE — ED Provider Notes (Signed)
[facility]    CSN: [id number] Arrival date & time: 10/08/21  1920      History   Chief Complaint Chief Complaint  Patient presents with   Cough   Diarrhea   Fatigue   Headache    HPI Ellen Avery is a 55 y.o. female presenting with for 1 month and body aches for few days.  Medical history arthritis, degenerative disc disease, fibromyalgia, GERD, hypertension as below.  Vague complaint of fatigue for about 1 month, now with body aches and congestion.  Has not tried any medications for the symptoms.  Has not monitored her temperature at home.  HPI  Past Medical History:  Diagnosis Date   Anxiety    Arthritis    Chronic headaches    DDD (degenerative disc disease), cervical    Fibromyalgia    GERD (gastroesophageal reflux disease)    History of colon polyps    History of psychosis    Hypercholesteremia    Hypertension    IBS (irritable bowel syndrome)    MDD (major depressive disorder)    Right sided facial pain    Seasonal allergic rhinitis    Sinus congestion    12-24-2018 per pt no fever   Type 2 diabetes mellitus treated with insulin ([place])    Vitamin D deficiency    Wears dentures    full upper,  partial lower   Wears glasses     Patient Active Problem List   Diagnosis Date Noted   Chest pain in adult 07/20/2019   IDDM (insulin dependent diabetes mellitus) 07/08/2018   HTN (hypertension) 07/08/2018   HLD (hyperlipidemia) 07/08/2018   Altered mental status 03/02/2016   MDD (major depressive disorder), recurrent episode, moderate ([place]) 03/02/2016   Psychoses ([place])     Past Surgical History:  Procedure Laterality Date   ARTERY BIOPSY Right 12/28/2018   Procedure: RIGHT BIOPSY TEMPORAL ARTERY;  Surgeon: Kieth Brightly, Arta Bruce, MD;  Location: [facility];  Service: General;  Laterality: Right;   CARDIAC CATHETERIZATION     MULTIPLE TOOTH EXTRACTIONS  04/ 2019    sedation   TOTAL ABDOMINAL HYSTERECTOMY W/ BILATERAL  SALPINGOOPHORECTOMY  2009    OB History   No obstetric history on file.      Home Medications    Prior to Admission medications   Medication Sig Start Date End Date Taking? Authorizing Provider  loperamide (IMODIUM) 2 MG capsule Take 1 capsule (2 mg total) by mouth 4 (four) times daily as needed for diarrhea or loose stools. 10/08/21  Yes Hazel Sams, PA-C  predniSONE (DELTASONE) 20 MG tablet Take 1 tablet (20 mg total) by mouth daily for 5 days. 10/08/21 10/13/21 Yes Hazel Sams, PA-C  ACCU-CHEK AVIVA PLUS test strip USE TO CHECK BLOOD SUGAR 4 TIMES DAILY 05/25/18   [provider]  aspirin EC 81 MG tablet Take 81 mg by mouth daily.    [provider]  Cholecalciferol (VITAMIN D3) 3000 units TABS Take 3,000 Units by mouth daily.    [provider]  DULoxetine (CYMBALTA) 60 MG capsule Take 1 capsule (60 mg total) by mouth daily. 04/03/20   Sharion Balloon, NP  fluticasone (FLONASE) 50 MCG/ACT nasal spray Place 2 sprays into both nostrils daily. 04/06/21   Brunetta Jeans, PA-C  gabapentin (NEURONTIN) 100 MG capsule Take 1-2 capsules (100-200 mg total) by mouth See admin instructions. Take 100 mg in the morning, 100 mg at lunch, and 200 mg in the  evening 04/03/20   Sharion Balloon, NP  hydrOXYzine (ATARAX/VISTARIL) 25 MG tablet Take 1 tablet (25 mg total) by mouth every 6 (six) hours as needed for anxiety. 09/13/18   [name], [name], MD  ibuprofen (ADVIL) 800 MG tablet Take 1 tablet (800 mg total) by mouth every 8 (eight) hours as needed. 07/24/21   Brunetta Jeans, PA-C  LANTUS SOLOSTAR 100 UNIT/ML Solostar Pen SMARTSIG:55 Unit(s) SUB-Q Every Night 12/12/19   [provider]  levocetirizine (XYZAL) 5 MG tablet Take 1 tablet (5 mg total) by mouth every evening. 04/06/21   Brunetta Jeans, PA-C  losartan-hydrochlorothiazide (HYZAAR) 100-12.5 MG tablet Take 1 tablet by mouth daily.  11/01/18   [provider]  metFORMIN (GLUCOPHAGE) 1000 MG tablet  Take 1,000 mg by mouth daily with breakfast.     [provider]  tiZANidine (ZANAFLEX) 4 MG tablet Take 1 tablet (4 mg total) by mouth every 6 (six) hours as needed for muscle spasms. 01/06/21   Jacquelin Hawking, NP    Family History Family History  Problem Relation Age of Onset   Diabetes Mother    Heart disease Paternal Grandmother    Breast cancer Paternal Grandmother    Diabetes Paternal Uncle    Heart Problems Brother    Breast cancer Maternal Aunt    Head & neck cancer Cousin    Cervical cancer Paternal Aunt    Colon cancer Neg Hx    Colon polyps Neg Hx    Esophageal cancer Neg Hx    Rectal cancer Neg Hx    Stomach cancer Neg Hx     Social History Social History   Tobacco Use   Smoking status: Every Day    Packs/day: 1.00    Years: 35.00    Pack years: 35.00    Types: Cigarettes   Smokeless tobacco: Never  Vaping Use   Vaping Use: Never used  Substance Use Topics   Alcohol use: No   Drug use: No     Allergies   Doxycycline and Flagyl [metronidazole]   Review of Systems Review of Systems  Constitutional:  Negative for appetite change, chills and fever.  HENT:  Negative for congestion, ear pain, rhinorrhea, sinus pressure, sinus pain and sore throat.   Eyes:  Negative for redness and visual disturbance.  Respiratory:  Negative for cough, chest tightness, shortness of breath and wheezing.   Cardiovascular:  Negative for chest pain and palpitations.  Gastrointestinal:  Negative for abdominal pain, constipation, diarrhea, nausea and vomiting.  Genitourinary:  Negative for dysuria, frequency and urgency.  Musculoskeletal:  Positive for myalgias.  Neurological:  Negative for dizziness, weakness and headaches.  Psychiatric/Behavioral:  Negative for confusion.   All other systems reviewed and are negative.   Physical Exam Triage Vital Signs ED Triage Vitals  Enc Vitals Group     BP 10/08/21 2019 135/90     Pulse Rate 10/08/21 2019 67     Resp  10/08/21 2019 18     Temp 10/08/21 2019 98.7 F (37.1 C)     Temp src --      SpO2 10/08/21 2019 100 %     Weight --      Height --      Head Circumference --      Peak Flow --      Pain Score 10/08/21 2020 7     Pain Loc --      Pain Edu? --      Excl.  in [place]? --    No data found.  Updated Vital Signs BP 135/90   Pulse 67   Temp 98.7 F (37.1 C)   Resp 18   SpO2 100%   Visual Acuity Right Eye Distance:   Left Eye Distance:   Bilateral Distance:    Right Eye Near:   Left Eye Near:    Bilateral Near:     Physical Exam Vitals reviewed.  Constitutional:      General: She is not in acute distress.    Appearance: Normal appearance. She is not ill-appearing.  HENT:     Head: Normocephalic and atraumatic.     Right Ear: Tympanic membrane, ear canal and external ear normal. No tenderness. No middle ear effusion. There is no impacted cerumen. Tympanic membrane is not perforated, erythematous, retracted or bulging.     Left Ear: Tympanic membrane, ear canal and external ear normal. No tenderness.  No middle ear effusion. There is no impacted cerumen. Tympanic membrane is not perforated, erythematous, retracted or bulging.     Nose: Nose normal. No congestion.     Mouth/Throat:     Mouth: Mucous membranes are moist.     Pharynx: Uvula midline. No oropharyngeal exudate or posterior oropharyngeal erythema.  Eyes:     Extraocular Movements: Extraocular movements intact.     Pupils: Pupils are equal, round, and reactive to light.  Cardiovascular:     Rate and Rhythm: Normal rate and regular rhythm.     Heart sounds: Normal heart sounds.  Pulmonary:     Effort: Pulmonary effort is normal.     Breath sounds: Normal breath sounds. No decreased breath sounds, wheezing, rhonchi or rales.  Abdominal:     Palpations: Abdomen is soft.     Tenderness: There is no abdominal tenderness. There is no guarding or rebound.  Lymphadenopathy:     Cervical: No cervical adenopathy.     Right  cervical: No superficial cervical adenopathy.    Left cervical: No superficial cervical adenopathy.  Neurological:     General: No focal deficit present.     Mental Status: She is alert and oriented to person, place, and time.  Psychiatric:        Mood and Affect: Mood normal.        Behavior: Behavior normal.        Thought Content: Thought content normal.        Judgment: Judgment normal.     UC Treatments / Results  Labs (all labs ordered are listed, but only abnormal results are displayed) Labs Reviewed  SARS CORONAVIRUS 2 (TAT 6-24 HRS)  POC INFLUENZA A AND B ANTIGEN (URGENT CARE ONLY)    EKG   Radiology No results found.  Procedures Procedures (including critical care time)  Medications Ordered in UC Medications - No data to display  Initial Impression / Assessment and Plan / UC Course  I have reviewed the triage vital signs and the nursing notes.  Pertinent labs & imaging results that were available during my care of the patient were reviewed by me and considered in my medical decision making (see chart for details).     This patient is a very pleasant 55 y.o. year old female presenting with fatigue and viral syndrome. Today this pt is afebrile nontachycardic nontachypneic, oxygenating well on room air, no wheezes rhonchi or rales.    Covid negative, influenza negative.   For diabetes - sugars running 160s at home. I sent low-dose prednisone, monitor this at  home  ED return precautions discussed. Patient verbalizes understanding and agreement.      Final Clinical Impressions(s) / UC Diagnoses   Final diagnoses:  Viral URI with cough  Encounter for screening for COVID-19  Type 2 diabetes mellitus with other specified complication, with long-term current use of insulin ([place])     Discharge Instructions      -Prednisone one pill daily x5 days. Take with food. This can cause energy -Take the Imodium (loperamide) up to 4 times daily for  diarrhea. -Take the Zofran (ondansetron) that you have at home up to 3 times daily for nausea and vomiting. Dissolve one pill under your tongue or between your teeth and your cheek.    ED Prescriptions     Medication Sig Dispense Auth. Provider   predniSONE (DELTASONE) 20 MG tablet Take 1 tablet (20 mg total) by mouth daily for 5 days. 5 tablet Hazel Sams, PA-C   loperamide (IMODIUM) 2 MG capsule Take 1 capsule (2 mg total) by mouth 4 (four) times daily as needed for diarrhea or loose stools. 12 capsule Hazel Sams, PA-C      PDMP not reviewed this encounter.   Hazel Sams, PA-C 10/11/21 1003

## 2021-10-08 NOTE — ED Triage Notes (Signed)
Pt is present today with nasal congestion, cough, HA, diarrhea, body aches and fatigue. Pt states that her sx started one month ago.

## 2021-10-08 NOTE — Discharge Instructions (Addendum)
-  Prednisone one pill daily x5 days. Take with food. This can cause energy -Take the Imodium (loperamide) up to 4 times daily for diarrhea. -Take the Zofran (ondansetron) that you have at home up to 3 times daily for nausea and vomiting. Dissolve one pill under your tongue or between your teeth and your cheek.

## 2021-10-09 LAB — SARS CORONAVIRUS 2 (TAT 6-24 HRS): SARS Coronavirus 2: NEGATIVE

## 2021-10-11 ENCOUNTER — Other Ambulatory Visit: Payer: Self-pay | Admitting: Internal Medicine

## 2021-10-11 DIAGNOSIS — Z1231 Encounter for screening mammogram for malignant neoplasm of breast: Secondary | ICD-10-CM

## 2021-10-21 ENCOUNTER — Telehealth: Payer: Medicaid Other | Admitting: Physician Assistant

## 2021-10-21 DIAGNOSIS — K0889 Other specified disorders of teeth and supporting structures: Secondary | ICD-10-CM

## 2021-10-21 DIAGNOSIS — K047 Periapical abscess without sinus: Secondary | ICD-10-CM

## 2021-10-21 MED ORDER — IBUPROFEN 800 MG PO TABS: 800.0000 mg | ORAL_TABLET | Freq: Three times a day (TID) | ORAL | 0 refills | Status: DC | PRN

## 2021-10-21 MED ORDER — AMOXICILLIN 500 MG PO CAPS: 500.0000 mg | ORAL_CAPSULE | Freq: Three times a day (TID) | ORAL | 0 refills | Status: AC

## 2021-10-21 NOTE — Progress Notes (Signed)
Virtual Visit Consent   Ellen Avery, you are scheduled for a virtual visit with a [facility] provider today.     Just as with appointments in the office, your consent must be obtained to participate.  Your consent will be active for this visit and any virtual visit you may have with one of our providers in the next 365 days.     If you have a MyChart account, a copy of this consent can be sent to you electronically.  All virtual visits are billed to your insurance company just like a traditional visit in the office.    As this is a virtual visit, video technology does not allow for your provider to perform a traditional examination.  This may limit your provider's ability to fully assess your condition.  If your provider identifies any concerns that need to be evaluated in person or the need to arrange testing (such as labs, EKG, etc.), we will make arrangements to do so.     Although advances in technology are sophisticated, we cannot ensure that it will always work on either your end or our end.  If the connection with a video visit is poor, the visit may have to be switched to a telephone visit.  With either a video or telephone visit, we are not always able to ensure that we have a secure connection.     I need to obtain your verbal consent now.   Are you willing to proceed with your visit today?    Ellen Avery has provided verbal consent on 10/21/2021 for a virtual visit (video or telephone).   Mar Daring, PA-C   Date: 10/21/2021 12:45 PM   Virtual Visit via Video Note   I, Mar Daring, connected with  Ellen Avery  ([id number], 01/03/1966) on 10/21/21 at 12:15 PM EST by a video-enabled telemedicine application and verified that I am speaking with the correct person using two identifiers.  Location: Patient: Virtual Visit Location Patient: Home Provider: Virtual Visit Location Provider: Home Office   I discussed the limitations of evaluation and management  by telemedicine and the availability of in person appointments. The patient expressed understanding and agreed to proceed.    History of Present Illness: Ellen Avery is a 55 y.o. who identifies as a female who was assigned female at birth, and is being seen today for dental issue.  HPI: Dental Pain  This is a new problem. The current episode started today. The problem occurs constantly. The pain is severe. Associated symptoms include difficulty swallowing (throat is sore), facial pain, a fever (99.8) and thermal sensitivity. Associated symptoms comments: Left facial swelling. She has tried acetaminophen (salt water gargles) for the symptoms. The treatment provided no relief.  Has partial on lower teeth. 3 teeth remain. Front lower left tooth is one that is painful and loose. Metal part of partial hits tooth and pushes causing more pain.   Problems:  Patient Active Problem List   Diagnosis Date Noted   Chest pain in adult 07/20/2019   IDDM (insulin dependent diabetes mellitus) 07/08/2018   HTN (hypertension) 07/08/2018   HLD (hyperlipidemia) 07/08/2018   Altered mental status 03/02/2016   MDD (major depressive disorder), recurrent episode, moderate ([place]) 03/02/2016   Psychoses ([place])     Allergies:  Allergies  Allergen Reactions   Doxycycline Hives and Itching   Flagyl [Metronidazole] Hives and Itching    Benadryl    Medications:  Current Outpatient Medications:  amoxicillin (AMOXIL) 500 MG capsule, Take 1 capsule (500 mg total) by mouth 3 (three) times daily for 10 days., Disp: 30 capsule, Rfl: 0   ibuprofen (ADVIL) 800 MG tablet, Take 1 tablet (800 mg total) by mouth every 8 (eight) hours as needed., Disp: 30 tablet, Rfl: 0   ACCU-CHEK AVIVA PLUS test strip, USE TO CHECK BLOOD SUGAR 4 TIMES DAILY, Disp: , Rfl: 2   aspirin EC 81 MG tablet, Take 81 mg by mouth daily., Disp: , Rfl:    Cholecalciferol (VITAMIN D3) 3000 units TABS, Take 3,000 Units by mouth daily., Disp: , Rfl:     DULoxetine (CYMBALTA) 60 MG capsule, Take 1 capsule (60 mg total) by mouth daily., Disp: 14 capsule, Rfl: 0   fluticasone (FLONASE) 50 MCG/ACT nasal spray, Place 2 sprays into both nostrils daily., Disp: 16 g, Rfl: 0   gabapentin (NEURONTIN) 100 MG capsule, Take 1-2 capsules (100-200 mg total) by mouth See admin instructions. Take 100 mg in the morning, 100 mg at lunch, and 200 mg in the evening, Disp: 60 capsule, Rfl: 0   hydrOXYzine (ATARAX/VISTARIL) 25 MG tablet, Take 1 tablet (25 mg total) by mouth every 6 (six) hours as needed for anxiety., Disp: 20 tablet, Rfl: 0   LANTUS SOLOSTAR 100 UNIT/ML Solostar Pen, SMARTSIG:55 Unit(s) SUB-Q Every Night, Disp: , Rfl:    levocetirizine (XYZAL) 5 MG tablet, Take 1 tablet (5 mg total) by mouth every evening., Disp: 30 tablet, Rfl: 0   loperamide (IMODIUM) 2 MG capsule, Take 1 capsule (2 mg total) by mouth 4 (four) times daily as needed for diarrhea or loose stools., Disp: 12 capsule, Rfl: 0   losartan-hydrochlorothiazide (HYZAAR) 100-12.5 MG tablet, Take 1 tablet by mouth daily. , Disp: , Rfl:    metFORMIN (GLUCOPHAGE) 1000 MG tablet, Take 1,000 mg by mouth daily with breakfast. , Disp: , Rfl:    tiZANidine (ZANAFLEX) 4 MG tablet, Take 1 tablet (4 mg total) by mouth every 6 (six) hours as needed for muscle spasms., Disp: 30 tablet, Rfl: 0  Observations/Objective: Patient is well-developed, well-nourished in no acute distress.  Resting comfortably at home.  Head is normocephalic, atraumatic.  No labored breathing.  Speech is clear and coherent with logical content.  Patient is alert and oriented at baseline.  Left lower jaw swollen, left front lower tooth does wiggle with patient manipulation  Assessment and Plan: 1. Tooth infection - amoxicillin (AMOXIL) 500 MG capsule; Take 1 capsule (500 mg total) by mouth 3 (three) times daily for 10 days.  Dispense: 30 capsule; Refill: 0  2. Pain, dental - amoxicillin (AMOXIL) 500 MG capsule; Take 1 capsule  (500 mg total) by mouth 3 (three) times daily for 10 days.  Dispense: 30 capsule; Refill: 0 - ibuprofen (ADVIL) 800 MG tablet; Take 1 tablet (800 mg total) by mouth every 8 (eight) hours as needed.  Dispense: 30 tablet; Refill: 0  - Will treat for possible infection with Amoxil - Ibuprofen 800mg  TID, tylenol between doses - Ice to jaw - Warm salt water gargles - Push fluids - Call dentist - Seek in person evaluation if not improving or worsening  Follow Up Instructions: I discussed the assessment and treatment plan with the patient. The patient was provided an opportunity to ask questions and all were answered. The patient agreed with the plan and demonstrated an understanding of the instructions.  A copy of instructions were sent to the patient via MyChart unless otherwise noted below.    The patient  was advised to call back or seek an in-person evaluation if the symptoms worsen or if the condition fails to improve as anticipated.  Time:  I spent 10 minutes with the patient via telehealth technology discussing the above problems/concerns.    Mar Daring, PA-C

## 2021-10-21 NOTE — Patient Instructions (Signed)
[name], thank you for joining Mar Daring, PA-C for today's virtual visit.  While this provider is not your primary care provider (PCP), if your PCP is located in our provider database this encounter information will be shared with them immediately following your visit.  Consent: (Patient) Ellen Avery provided verbal consent for this virtual visit at the beginning of the encounter.  Current Medications:  Current Outpatient Medications:    amoxicillin (AMOXIL) 500 MG capsule, Take 1 capsule (500 mg total) by mouth 3 (three) times daily for 10 days., Disp: 30 capsule, Rfl: 0   ibuprofen (ADVIL) 800 MG tablet, Take 1 tablet (800 mg total) by mouth every 8 (eight) hours as needed., Disp: 30 tablet, Rfl: 0   ACCU-CHEK AVIVA PLUS test strip, USE TO CHECK BLOOD SUGAR 4 TIMES DAILY, Disp: , Rfl: 2   aspirin EC 81 MG tablet, Take 81 mg by mouth daily., Disp: , Rfl:    Cholecalciferol (VITAMIN D3) 3000 units TABS, Take 3,000 Units by mouth daily., Disp: , Rfl:    DULoxetine (CYMBALTA) 60 MG capsule, Take 1 capsule (60 mg total) by mouth daily., Disp: 14 capsule, Rfl: 0   fluticasone (FLONASE) 50 MCG/ACT nasal spray, Place 2 sprays into both nostrils daily., Disp: 16 g, Rfl: 0   gabapentin (NEURONTIN) 100 MG capsule, Take 1-2 capsules (100-200 mg total) by mouth See admin instructions. Take 100 mg in the morning, 100 mg at lunch, and 200 mg in the evening, Disp: 60 capsule, Rfl: 0   hydrOXYzine (ATARAX/VISTARIL) 25 MG tablet, Take 1 tablet (25 mg total) by mouth every 6 (six) hours as needed for anxiety., Disp: 20 tablet, Rfl: 0   LANTUS SOLOSTAR 100 UNIT/ML Solostar Pen, SMARTSIG:55 Unit(s) SUB-Q Every Night, Disp: , Rfl:    levocetirizine (XYZAL) 5 MG tablet, Take 1 tablet (5 mg total) by mouth every evening., Disp: 30 tablet, Rfl: 0   loperamide (IMODIUM) 2 MG capsule, Take 1 capsule (2 mg total) by mouth 4 (four) times daily as needed for diarrhea or loose stools., Disp: 12 capsule,  Rfl: 0   losartan-hydrochlorothiazide (HYZAAR) 100-12.5 MG tablet, Take 1 tablet by mouth daily. , Disp: , Rfl:    metFORMIN (GLUCOPHAGE) 1000 MG tablet, Take 1,000 mg by mouth daily with breakfast. , Disp: , Rfl:    tiZANidine (ZANAFLEX) 4 MG tablet, Take 1 tablet (4 mg total) by mouth every 6 (six) hours as needed for muscle spasms., Disp: 30 tablet, Rfl: 0   Medications ordered in this encounter:  Meds ordered this encounter  Medications   amoxicillin (AMOXIL) 500 MG capsule    Sig: Take 1 capsule (500 mg total) by mouth 3 (three) times daily for 10 days.    Dispense:  30 capsule    Refill:  0    Order Specific Question:   Supervising Provider    Answer:   MILLER, BRIAN [[id number]]   ibuprofen (ADVIL) 800 MG tablet    Sig: Take 1 tablet (800 mg total) by mouth every 8 (eight) hours as needed.    Dispense:  30 tablet    Refill:  0    Order Specific Question:   Supervising Provider    Answer:   Sabra Heck, BRIAN [[id number]]     *If you need refills on other medications prior to your next appointment, please contact your pharmacy*  Follow-Up: Call back or seek an in-person evaluation if the symptoms worsen or if the condition fails to improve as anticipated.  Other  Instructions Dental Pain Dental pain is often a sign that something is wrong with your teeth or gums. You can also have pain after a dental treatment. If you have dental pain, it is important to contact your dentist, especially if the cause of the pain is not known. Dental pain may [place] a lot or a little and can be caused by many things, including: Tooth decay (cavities or caries). Infection. The inner part of the tooth being filled with pus (an abscess). Injury. A crack in the tooth. Gums that move back and expose the root of a tooth. Gum disease. Abnormal grinding or clenching of teeth. Not taking good care of your teeth. Sometimes the cause of pain is not known. You may have pain all the time, or it may happen only when you  are: Chewing. Exposed to hot or cold temperatures. Eating or drinking foods or drinks that have a lot of sugar in them, such as soda or candy. Follow these instructions at home: Medicines Take over-the-counter and prescription medicines only as told by your dentist. If you were prescribed an antibiotic medicine, take it as told by your dentist. Do not stop taking it even if you start to feel better. Eating and drinking Do not eat foods or drinks that cause you pain. These include: Very hot or very cold foods or drinks. Sweet or sugary foods or drinks. Managing pain and swelling  If told, put ice on the painful area of your face. To do this: Put ice in a plastic bag. Place a towel between your skin and the bag. Leave the ice on for 20 minutes, 2-3 times a day. Take off the ice if your skin turns bright red. This is very important. If you cannot feel pain, heat, or cold, you have a greater risk of damage to the area. Brushing your teeth Brush your teeth twice a day using a fluoride toothpaste. Use a toothpaste made for sensitive teeth as told by your dentist. Use a soft toothbrush. General instructions Floss your teeth at least once a day. Do not put heat on the outside of your face. Rinse your mouth often with salt water. To make salt water, dissolve -1 tsp (3-6 g) of salt in 1 cup (237 mL) of warm water. Watch your dental pain. Let your dentist know if there are any changes. Keep all follow-up visits. Contact a dentist if: You have dental pain and you do not know why. Medicine does not help your pain. Your symptoms get worse. You have new symptoms. Get help right away if: You cannot open your mouth. You are having trouble breathing or swallowing. You have a fever. Your face, neck, or jaw is swollen. These symptoms may be an emergency. Get help right away. Call your local emergency services (911 in the [place] Do not wait to see if the symptoms will go away. Do not drive  yourself to the hospital. Summary Dental pain may be caused by many things, including tooth decay, injury, or infection. In some cases, the cause is not known. Dental pain may [place] a lot or very little. You may have pain all the time, or you may have it only when you eat or drink. Take over-the-counter and prescription medicines only as told by your dentist. Watch your dental pain for any changes. Let your dentist know if symptoms get worse. This information is not intended to replace advice given to you by your health care provider. Make sure you discuss any questions you  have with your health care provider. Document Revised: 08/29/2020 Document Reviewed: 08/29/2020 Elsevier Patient Education  2022 [employer].    If you have been instructed to have an in-person evaluation today at a local Urgent Care facility, please use the link below. It will take you to a list of all of our available [facility], including address, phone number and hours of operation. Please do not delay care.  [facility]  If you or a family member do not have a primary care provider, use the link below to schedule a visit and establish care. When you choose a [facility] primary care physician or advanced practice provider, you gain a long-term partner in health. Find a Primary Care Provider  Learn more about [facility]'s in-office and virtual care options: [facility]

## 2021-11-19 ENCOUNTER — Ambulatory Visit (HOSPITAL_COMMUNITY)
Admission: EM | Admit: 2021-11-19 | Discharge: 2021-11-19 | Disposition: A | Discharge status: Home / Self Care | Payer: Medicaid Other | Attending: Physician Assistant | Admitting: Physician Assistant

## 2021-11-19 ENCOUNTER — Ambulatory Visit
Admission: RE | Admit: 2021-11-19 | Discharge: 2021-11-19 | Disposition: A | Discharge status: Home / Self Care | Payer: Medicaid Other | Source: Ambulatory Visit | Attending: Internal Medicine | Admitting: Internal Medicine

## 2021-11-19 ENCOUNTER — Encounter (HOSPITAL_COMMUNITY): Payer: Self-pay | Admitting: Emergency Medicine

## 2021-11-19 ENCOUNTER — Other Ambulatory Visit: Payer: Self-pay

## 2021-11-19 DIAGNOSIS — Z1231 Encounter for screening mammogram for malignant neoplasm of breast: Secondary | ICD-10-CM

## 2021-11-19 DIAGNOSIS — R0989 Other specified symptoms and signs involving the circulatory and respiratory systems: Secondary | ICD-10-CM | POA: Diagnosis not present

## 2021-11-19 DIAGNOSIS — J329 Chronic sinusitis, unspecified: Secondary | ICD-10-CM | POA: Diagnosis not present

## 2021-11-19 DIAGNOSIS — R051 Acute cough: Secondary | ICD-10-CM

## 2021-11-19 DIAGNOSIS — J40 Bronchitis, not specified as acute or chronic: Secondary | ICD-10-CM

## 2021-11-19 MED ORDER — CEFDINIR 300 MG PO CAPS: 300.0000 mg | ORAL_CAPSULE | Freq: Two times a day (BID) | ORAL | 0 refills | Status: DC

## 2021-11-19 MED ORDER — AZITHROMYCIN 250 MG PO TABS: 250.0000 mg | ORAL_TABLET | Freq: Every day | ORAL | 0 refills | Status: DC

## 2021-11-19 NOTE — ED Triage Notes (Signed)
Onset of symptoms 11/12/2021.  Patient complains of nasal and chest congestion.  Stomach pain, cough, and thinks she pulled a muscle.  Patient complains of diarrhea, no vomiting.  Reports 3 episodes of diarrhea today and did take immodium

## 2021-11-19 NOTE — Discharge Instructions (Signed)
Please take antibiotics as prescribed.  Continue using Mucinex, Flonase, over-the-counter medications for symptom relief.  If your symptoms are not improving we can consider prednisone but since you have diabetes this can raise your blood sugar and cause a lot of complications we will hold off on this for now.  Sure you rest and drink plenty of fluid.  If anything worsens please return for reevaluation.  Follow-up with your primary care provider within a week to ensure symptom improvement.

## 2021-11-19 NOTE — ED Provider Notes (Signed)
[facility]    CSN: [id number] Arrival date & time: 11/19/21  1129      History   Chief Complaint Chief Complaint  Patient presents with   URI    HPI Ellen Avery is a 55 y.o. female.   Patient presents today with a week plus long history of URI symptoms.  Reports nasal congestion, sinus pressure, productive cough, chest tightness, diarrhea, fatigue, malaise.  Denies any chest pain, shortness of breath, fever.  She has tried over-the-counter medications without improvement of symptoms.  She has had URI symptoms approximately 6 weeks ago and was given prednisone with resolution of symptoms until recurrence approximately a week.  Does report that she has been taking antibiotics for dental abscess (amoxicillin) 2 to 3 weeks ago.  Denies additional antibiotic use since that time.  She does have a history of diabetes but denies history of asthma, COPD.  She does have allergies and has been taking medication as prescribed.  She has not had COVID in the past.   Past Medical History:  Diagnosis Date   Anxiety    Arthritis    Chronic headaches    DDD (degenerative disc disease), cervical    Fibromyalgia    GERD (gastroesophageal reflux disease)    History of colon polyps    History of psychosis    Hypercholesteremia    Hypertension    IBS (irritable bowel syndrome)    MDD (major depressive disorder)    Right sided facial pain    Seasonal allergic rhinitis    Sinus congestion    12-24-2018 per pt no fever   Type 2 diabetes mellitus treated with insulin ([place])    Vitamin D deficiency    Wears dentures    full upper,  partial lower   Wears glasses     Patient Active Problem List   Diagnosis Date Noted   Chest pain in adult 07/20/2019   IDDM (insulin dependent diabetes mellitus) 07/08/2018   HTN (hypertension) 07/08/2018   HLD (hyperlipidemia) 07/08/2018   Altered mental status 03/02/2016   MDD (major depressive disorder), recurrent episode, moderate ([place])  03/02/2016   Psychoses ([place])     Past Surgical History:  Procedure Laterality Date   ARTERY BIOPSY Right 12/28/2018   Procedure: RIGHT BIOPSY TEMPORAL ARTERY;  Surgeon: Kieth Brightly, Arta Bruce, MD;  Location: [facility];  Service: General;  Laterality: Right;   CARDIAC CATHETERIZATION     MULTIPLE TOOTH EXTRACTIONS  04/ 2019    sedation   TOTAL ABDOMINAL HYSTERECTOMY W/ BILATERAL SALPINGOOPHORECTOMY  2009    OB History   No obstetric history on file.      Home Medications    Prior to Admission medications   Medication Sig Start Date End Date Taking? Authorizing Provider  azithromycin (ZITHROMAX) 250 MG tablet Take 1 tablet (250 mg total) by mouth daily. Take first 2 tablets together, then 1 every day until finished. 11/19/21  Yes Denzell Colasanti K, PA-C  cefdinir (OMNICEF) 300 MG capsule Take 1 capsule (300 mg total) by mouth 2 (two) times daily. 11/19/21  Yes Bolivar Koranda K, PA-C  ACCU-CHEK AVIVA PLUS test strip USE TO CHECK BLOOD SUGAR 4 TIMES DAILY 05/25/18   [provider]  aspirin EC 81 MG tablet Take 81 mg by mouth daily.    [provider]  Cholecalciferol (VITAMIN D3) 3000 units TABS Take 3,000 Units by mouth daily.    [provider]  DULoxetine (CYMBALTA) 60 MG capsule Take 1 capsule (  60 mg total) by mouth daily. 04/03/20   Sharion Balloon, NP  fluticasone (FLONASE) 50 MCG/ACT nasal spray Place 2 sprays into both nostrils daily. 04/06/21   Brunetta Jeans, PA-C  gabapentin (NEURONTIN) 100 MG capsule Take 1-2 capsules (100-200 mg total) by mouth See admin instructions. Take 100 mg in the morning, 100 mg at lunch, and 200 mg in the evening 04/03/20   Sharion Balloon, NP  hydrOXYzine (ATARAX/VISTARIL) 25 MG tablet Take 1 tablet (25 mg total) by mouth every 6 (six) hours as needed for anxiety. 09/13/18   [name], [name], MD  ibuprofen (ADVIL) 800 MG tablet Take 1 tablet (800 mg total) by mouth every 8 (eight) hours as needed. 10/21/21    Mar Daring, PA-C  LANTUS SOLOSTAR 100 UNIT/ML Solostar Pen SMARTSIG:55 Unit(s) SUB-Q Every Night 12/12/19   [provider]  levocetirizine (XYZAL) 5 MG tablet Take 1 tablet (5 mg total) by mouth every evening. 04/06/21   Brunetta Jeans, PA-C  loperamide (IMODIUM) 2 MG capsule Take 1 capsule (2 mg total) by mouth 4 (four) times daily as needed for diarrhea or loose stools. 10/08/21   Hazel Sams, PA-C  losartan-hydrochlorothiazide (HYZAAR) 100-12.5 MG tablet Take 1 tablet by mouth daily.  11/01/18   [provider]  metFORMIN (GLUCOPHAGE) 1000 MG tablet Take 1,000 mg by mouth daily with breakfast.     [provider]  tiZANidine (ZANAFLEX) 4 MG tablet Take 1 tablet (4 mg total) by mouth every 6 (six) hours as needed for muscle spasms. 01/06/21   Jacquelin Hawking, NP    Family History Family History  Problem Relation Age of Onset   Diabetes Mother    Heart disease Paternal Grandmother    Breast cancer Paternal Grandmother    Diabetes Paternal Uncle    Heart Problems Brother    Breast cancer Maternal Aunt    Head & neck cancer Cousin    Cervical cancer Paternal Aunt    Colon cancer Neg Hx    Colon polyps Neg Hx    Esophageal cancer Neg Hx    Rectal cancer Neg Hx    Stomach cancer Neg Hx     Social History Social History   Tobacco Use   Smoking status: Every Day    Packs/day: 1.00    Years: 35.00    Pack years: 35.00    Types: Cigarettes   Smokeless tobacco: Never  Vaping Use   Vaping Use: Never used  Substance Use Topics   Alcohol use: No   Drug use: No     Allergies   Doxycycline and Flagyl [metronidazole]   Review of Systems Review of Systems  Constitutional:  Positive for activity change, chills and fatigue. Negative for appetite change and fever.  HENT:  Positive for congestion and sinus pressure. Negative for sneezing and sore throat.   Respiratory:  Positive for cough, chest tightness and wheezing. Negative for shortness  of breath.   Cardiovascular:  Negative for chest pain.  Gastrointestinal:  Positive for diarrhea. Negative for abdominal pain, nausea and vomiting.  Musculoskeletal:  Negative for arthralgias and myalgias.  Neurological:  Positive for headaches. Negative for dizziness and light-headedness.    Physical Exam Triage Vital Signs ED Triage Vitals  Enc Vitals Group     BP 11/19/21 1321 132/86     Pulse Rate 11/19/21 1321 75     Resp 11/19/21 1321 (!) 22     Temp 11/19/21 1321 98 F (36.7 C)  Temp Source 11/19/21 1321 Oral     SpO2 11/19/21 1321 98 %     Weight --      Height --      Head Circumference --      Peak Flow --      Pain Score 11/19/21 1318 7     Pain Loc --      Pain Edu? --      Excl. in [place]? --    No data found.  Updated Vital Signs BP 132/86 (BP Location: Left Arm) Comment (BP Location): large cuff   Pulse 75    Temp 98 F (36.7 C) (Oral)    Resp (!) 22    SpO2 98%   Visual Acuity Right Eye Distance:   Left Eye Distance:   Bilateral Distance:    Right Eye Near:   Left Eye Near:    Bilateral Near:     Physical Exam Vitals reviewed.  Constitutional:      General: She is awake. She is not in acute distress.    Appearance: Normal appearance. She is well-developed. She is not ill-appearing.     Comments: Very pleasant female appears stated age in no acute distress sitting comfortably in exam room  HENT:     Head: Normocephalic and atraumatic.     Right Ear: Tympanic membrane, ear canal and external ear normal. Tympanic membrane is not erythematous or bulging.     Left Ear: Tympanic membrane, ear canal and external ear normal. Tympanic membrane is not erythematous or bulging.     Nose:     Right Sinus: Maxillary sinus tenderness and frontal sinus tenderness present.     Left Sinus: Maxillary sinus tenderness and frontal sinus tenderness present.     Mouth/Throat:     Pharynx: Uvula midline. Posterior oropharyngeal erythema present. No oropharyngeal  exudate.     Comments: Drainage present posterior pharynx Cardiovascular:     Rate and Rhythm: Normal rate and regular rhythm.     Heart sounds: Normal heart sounds, S1 normal and S2 normal. No murmur heard. Pulmonary:     Effort: Pulmonary effort is normal.     Breath sounds: Rhonchi present. No wheezing or rales.     Comments: Scattered rhonchi clear with cough Psychiatric:        Behavior: Behavior is cooperative.     UC Treatments / Results  Labs (all labs ordered are listed, but only abnormal results are displayed) Labs Reviewed - No data to display  EKG   Radiology No results found.  Procedures Procedures (including critical care time)  Medications Ordered in UC Medications - No data to display  Initial Impression / Assessment and Plan / UC Course  I have reviewed the triage vital signs and the nursing notes.  Pertinent labs & imaging results that were available during my care of the patient were reviewed by me and considered in my medical decision making (see chart for details).     No indication for viral testing given patient has been symptomatic for over a week and this would not change management.  Given recent worsening of symptoms will cover for sinobronchitis.  Patient has recently taken amoxicillin for tooth infection so will use cephalosporin and azithromycin.  She is diabetic so we will defer prednisone.  Recommended she use over-the-counter medications including Tylenol, Mucinex, Flonase for additional symptom relief.  She is to rest and drink plenty of fluid.  Discussed alarm symptoms that warrant emergent evaluation.  Discussed that if  symptoms persist, we would consider chest x-ray at follow-up appointment.  Return precautions given to which she expressed understanding.  Final Clinical Impressions(s) / UC Diagnoses   Final diagnoses:  Sinobronchitis  Acute cough  Chest congestion     Discharge Instructions      Please take antibiotics as  prescribed.  Continue using Mucinex, Flonase, over-the-counter medications for symptom relief.  If your symptoms are not improving we can consider prednisone but since you have diabetes this can raise your blood sugar and cause a lot of complications we will hold off on this for now.  Sure you rest and drink plenty of fluid.  If anything worsens please return for reevaluation.  Follow-up with your primary care provider within a week to ensure symptom improvement.     ED Prescriptions     Medication Sig Dispense Auth. Provider   azithromycin (ZITHROMAX) 250 MG tablet Take 1 tablet (250 mg total) by mouth daily. Take first 2 tablets together, then 1 every day until finished. 6 tablet Claritza July K, PA-C   cefdinir (OMNICEF) 300 MG capsule Take 1 capsule (300 mg total) by mouth 2 (two) times daily. 20 capsule Audrianna Driskill, Derry Skill, PA-C      PDMP not reviewed this encounter.   Terrilee Croak, PA-C 11/19/21 1355

## 2021-11-27 DIAGNOSIS — L298 Other pruritus: Secondary | ICD-10-CM | POA: Diagnosis not present

## 2021-11-27 DIAGNOSIS — E1142 Type 2 diabetes mellitus with diabetic polyneuropathy: Secondary | ICD-10-CM | POA: Diagnosis not present

## 2021-11-27 DIAGNOSIS — Z23 Encounter for immunization: Secondary | ICD-10-CM | POA: Diagnosis not present

## 2021-11-27 DIAGNOSIS — N760 Acute vaginitis: Secondary | ICD-10-CM | POA: Diagnosis not present

## 2021-11-27 DIAGNOSIS — J019 Acute sinusitis, unspecified: Secondary | ICD-10-CM | POA: Diagnosis not present

## 2021-11-27 DIAGNOSIS — K2100 Gastro-esophageal reflux disease with esophagitis, without bleeding: Secondary | ICD-10-CM | POA: Diagnosis not present

## 2021-12-06 ENCOUNTER — Encounter (HOSPITAL_COMMUNITY): Payer: Self-pay

## 2021-12-06 ENCOUNTER — Ambulatory Visit (HOSPITAL_COMMUNITY)
Admission: EM | Admit: 2021-12-06 | Discharge: 2021-12-06 | Disposition: A | Discharge status: Home / Self Care | Payer: Medicaid Other | Attending: Urgent Care | Admitting: Urgent Care

## 2021-12-06 ENCOUNTER — Other Ambulatory Visit: Payer: Self-pay

## 2021-12-06 DIAGNOSIS — Z794 Long term (current) use of insulin: Secondary | ICD-10-CM | POA: Diagnosis not present

## 2021-12-06 DIAGNOSIS — T7840XD Allergy, unspecified, subsequent encounter: Secondary | ICD-10-CM | POA: Diagnosis not present

## 2021-12-06 DIAGNOSIS — E1165 Type 2 diabetes mellitus with hyperglycemia: Secondary | ICD-10-CM

## 2021-12-06 MED ORDER — NOVOLOG FLEXPEN 100 UNIT/ML ~~LOC~~ SOPN: PEN_INJECTOR | SUBCUTANEOUS | 0 refills | Status: DC

## 2021-12-06 MED ORDER — PREDNISONE 10 MG PO TABS: 10.0000 mg | ORAL_TABLET | Freq: Two times a day (BID) | ORAL | 0 refills | Status: AC

## 2021-12-06 NOTE — ED Provider Notes (Signed)
[place]    CSN: [id number] Arrival date & time: 12/06/21  1844      History   Chief Complaint Chief Complaint  Patient presents with   Pruritis    HPI Ellen Avery is a 55 y.o. female.   Pleasant 55 year old female presents today with complaint of a 1 week history of itching, internal hives, and pain.  She apparently was seen on December 13 and given a prescription of azithromycin and cefdinir simultaneously.  She completed both antibiotics as prescribed, but states soon after completion she started developing hives.  She states initially she had a rash externally, but now the visible rash is resolved and "all symptoms are internally".  She was seen by her primary care physician and given a prescription of hydroxyzine and famotidine.  She states she has been taking this as prescribed for the past 5 days and feels that symptoms are not improved.  She states it is preventing her from sleeping.  She is requesting prednisone although she states it does cause elevations of her blood sugar.  She reports her glucose this morning was 200.  She admits to having had an epidural steroid injection in the past that did not cause any blood sugar fluctuations.  She has used NovoLog in the past when she was initially diagnosed and states she understands how to use it on a sliding scale.  Patient is denying any wheezing, throat swelling, tongue swelling, dysphagia, tingling.  She does have a history of reflux but denies it being worse at this time.    Past Medical History:  Diagnosis Date   Anxiety    Arthritis    Chronic headaches    DDD (degenerative disc disease), cervical    Fibromyalgia    GERD (gastroesophageal reflux disease)    History of colon polyps    History of psychosis    Hypercholesteremia    Hypertension    IBS (irritable bowel syndrome)    MDD (major depressive disorder)    Right sided facial pain    Seasonal allergic rhinitis    Sinus congestion     12-24-2018 per pt no fever   Type 2 diabetes mellitus treated with insulin ([place])    Vitamin D deficiency    Wears dentures    full upper,  partial lower   Wears glasses     Patient Active Problem List   Diagnosis Date Noted   Chest pain in adult 07/20/2019   IDDM (insulin dependent diabetes mellitus) 07/08/2018   HTN (hypertension) 07/08/2018   HLD (hyperlipidemia) 07/08/2018   Altered mental status 03/02/2016   MDD (major depressive disorder), recurrent episode, moderate ([place]) 03/02/2016   Psychoses ([place])     Past Surgical History:  Procedure Laterality Date   ARTERY BIOPSY Right 12/28/2018   Procedure: RIGHT BIOPSY TEMPORAL ARTERY;  Surgeon: Kieth Brightly, Arta Bruce, MD;  Location: [facility];  Service: General;  Laterality: Right;   CARDIAC CATHETERIZATION     MULTIPLE TOOTH EXTRACTIONS  04/ 2019    sedation   TOTAL ABDOMINAL HYSTERECTOMY W/ BILATERAL SALPINGOOPHORECTOMY  2009    OB History   No obstetric history on file.      Home Medications    Prior to Admission medications   Medication Sig Start Date End Date Taking? Authorizing Provider  insulin aspart (NOVOLOG FLEXPEN) 100 UNIT/ML FlexPen Inject 4 units subcutaneously three times daily as needed for glucose levels >250. 12/06/21  Yes Dawana Asper L, PA  predniSONE (DELTASONE) 10  MG tablet Take 1 tablet (10 mg total) by mouth 2 (two) times daily with a meal for 5 days. 12/06/21 12/11/21 Yes Charlisa Cham L, PA  ACCU-CHEK AVIVA PLUS test strip USE TO CHECK BLOOD SUGAR 4 TIMES DAILY 05/25/18   [provider]  aspirin EC 81 MG tablet Take 81 mg by mouth daily.    [provider]  Cholecalciferol (VITAMIN D3) 3000 units TABS Take 3,000 Units by mouth daily.    [provider]  DULoxetine (CYMBALTA) 60 MG capsule Take 1 capsule (60 mg total) by mouth daily. 04/03/20   Sharion Balloon, NP  fluticasone (FLONASE) 50 MCG/ACT nasal spray Place 2 sprays into both nostrils daily. 04/06/21    Brunetta Jeans, PA-C  gabapentin (NEURONTIN) 100 MG capsule Take 1-2 capsules (100-200 mg total) by mouth See admin instructions. Take 100 mg in the morning, 100 mg at lunch, and 200 mg in the evening 04/03/20   Sharion Balloon, NP  hydrOXYzine (ATARAX/VISTARIL) 25 MG tablet Take 1 tablet (25 mg total) by mouth every 6 (six) hours as needed for anxiety. 09/13/18   [name], [name], MD  ibuprofen (ADVIL) 800 MG tablet Take 1 tablet (800 mg total) by mouth every 8 (eight) hours as needed. 10/21/21   Mar Daring, PA-C  LANTUS SOLOSTAR 100 UNIT/ML Solostar Pen SMARTSIG:55 Unit(s) SUB-Q Every Night 12/12/19   [provider]  levocetirizine (XYZAL) 5 MG tablet Take 1 tablet (5 mg total) by mouth every evening. 04/06/21   Brunetta Jeans, PA-C  loperamide (IMODIUM) 2 MG capsule Take 1 capsule (2 mg total) by mouth 4 (four) times daily as needed for diarrhea or loose stools. 10/08/21   Hazel Sams, PA-C  losartan-hydrochlorothiazide (HYZAAR) 100-12.5 MG tablet Take 1 tablet by mouth daily.  11/01/18   [provider]  metFORMIN (GLUCOPHAGE) 1000 MG tablet Take 1,000 mg by mouth daily with breakfast.     [provider]  tiZANidine (ZANAFLEX) 4 MG tablet Take 1 tablet (4 mg total) by mouth every 6 (six) hours as needed for muscle spasms. 01/06/21   Jacquelin Hawking, NP    Family History Family History  Problem Relation Age of Onset   Diabetes Mother    Heart disease Paternal Grandmother    Breast cancer Paternal Grandmother    Diabetes Paternal Uncle    Heart Problems Brother    Breast cancer Maternal Aunt    Head & neck cancer Cousin    Cervical cancer Paternal Aunt    Colon cancer Neg Hx    Colon polyps Neg Hx    Esophageal cancer Neg Hx    Rectal cancer Neg Hx    Stomach cancer Neg Hx     Social History Social History   Tobacco Use   Smoking status: Every Day    Packs/day: 1.00    Years: 35.00    Pack years: 35.00    Types: Cigarettes    Smokeless tobacco: Never  Vaping Use   Vaping Use: Never used  Substance Use Topics   Alcohol use: No   Drug use: No     Allergies   Doxycycline and Flagyl [metronidazole]   Review of Systems Review of Systems As per hpi  Physical Exam Triage Vital Signs ED Triage Vitals  Enc Vitals Group     BP 12/06/21 1920 122/85     Pulse Rate 12/06/21 1920 81     Resp 12/06/21 1920 17     Temp  12/06/21 1920 99.6 F (37.6 C)     Temp Source 12/06/21 1920 Oral     SpO2 12/06/21 1920 93 %     Weight --      Height --      Head Circumference --      Peak Flow --      Pain Score 12/06/21 1923 0     Pain Loc --      Pain Edu? --      Excl. in [place]? --    No data found.  Updated Vital Signs BP 122/85 (BP Location: Left Arm)    Pulse 81    Temp 99.6 F (37.6 C) (Oral)    Resp 17    SpO2 93%   Visual Acuity Right Eye Distance:   Left Eye Distance:   Bilateral Distance:    Right Eye Near:   Left Eye Near:    Bilateral Near:     Physical Exam Vitals and nursing note reviewed. Exam conducted with a chaperone present.  Constitutional:      General: She is not in acute distress.    Appearance: Normal appearance. She is well-developed. She is obese. She is not ill-appearing or toxic-appearing.  HENT:     Head: Normocephalic and atraumatic.     Right Ear: Tympanic membrane, ear canal and external ear normal.     Left Ear: Tympanic membrane, ear canal and external ear normal.     Nose: Nose normal. No congestion or rhinorrhea.     Mouth/Throat:     Mouth: Mucous membranes are moist.     Pharynx: No oropharyngeal exudate or posterior oropharyngeal erythema.  Eyes:     General: No scleral icterus.       Right eye: No discharge.        Left eye: No discharge.     Conjunctiva/sclera: Conjunctivae normal.     Pupils: Pupils are equal, round, and reactive to light.  Cardiovascular:     Rate and Rhythm: Normal rate and regular rhythm.     Heart sounds: No murmur heard. Pulmonary:      Effort: Pulmonary effort is normal. No respiratory distress.     Breath sounds: Normal breath sounds. No stridor. No wheezing, rhonchi or rales.  Chest:     Chest wall: No tenderness.  Abdominal:     Palpations: Abdomen is soft.     Tenderness: There is no abdominal tenderness.  Musculoskeletal:        General: No swelling.     Cervical back: Normal range of motion and neck supple.  Skin:    General: Skin is warm and dry.     Capillary Refill: Capillary refill takes less than 2 seconds.  Neurological:     Mental Status: She is alert.  Psychiatric:        Mood and Affect: Mood normal.     UC Treatments / Results  Labs (all labs ordered are listed, but only abnormal results are displayed) Labs Reviewed - No data to display  EKG   Radiology No results found.  Procedures Procedures (including critical care time)  Medications Ordered in UC Medications - No data to display  Initial Impression / Assessment and Plan / UC Course  I have reviewed the triage vital signs and the nursing notes.  Pertinent labs & imaging results that were available during my care of the patient were reviewed by me and considered in my medical decision making (see chart for details).  Allergic reaction -suspect patient's symptoms are irritating her GI tract, no visible external symptoms noted.  Patient is already taking max dose famotidine and hydroxyzine.  She also takes Xyzal.  She reports she is extremely bothered by these internal symptoms and is requesting prednisone despite its known reaction to cause hyperglycemia. Diabetes mellitus -at baseline it appears uncontrolled.  Patient had over 200 blood sugar this morning.  I will give her a short-term dose of NovoLog to combat the likely elevated glucose reading she will see while taking the prednisone.  Patient understands the risks associated, and feels the benefit outweighs these risks.  She is to contact her PCP should her symptoms persist  despite the additive medication.  Final Clinical Impressions(s) / UC Diagnoses   Final diagnoses:  Allergic reaction, subsequent encounter  Type 2 diabetes mellitus with hyperglycemia, with long-term current use of insulin [facility])     Discharge Instructions      Start taking 10mg  prednisone twice daily for 5 days. Keep a close eye on your glucose levels. If blood sugar elevates >250, you can administer 4 units of novolog, a rapid acting insulin. You can do this up to three times daily as needed for elevated sugars. Continue your hydroxyzine - you can take a maximum dose of 100mg  daily (4 tabs daily) Continue famotidine - you can take a maximum dose of 40mg  daily  If your symptoms persist, or if your blood sugar does not tolerate the additional medication, follow up here or with PCP for further evaluation.    ED Prescriptions     Medication Sig Dispense Auth. Provider   predniSONE (DELTASONE) 10 MG tablet Take 1 tablet (10 mg total) by mouth 2 (two) times daily with a meal for 5 days. 10 tablet Kendrik Mcshan L, PA   insulin aspart (NOVOLOG FLEXPEN) 100 UNIT/ML FlexPen Inject 4 units subcutaneously three times daily as needed for glucose levels >250. 3 mL Hlee Fringer L, PA      PDMP not reviewed this encounter.   Chaney Malling, [place] 12/06/21 2017

## 2021-12-06 NOTE — ED Triage Notes (Signed)
Pt presents with generalized itching all over her body that is unrelieved with prescribed and OTC medication.

## 2021-12-06 NOTE — Discharge Instructions (Addendum)
Start taking 10mg  prednisone twice daily for 5 days. Keep a close eye on your glucose levels. If blood sugar elevates >250, you can administer 4 units of novolog, a rapid acting insulin. You can do this up to three times daily as needed for elevated sugars. Continue your hydroxyzine - you can take a maximum dose of 100mg  daily (4 tabs daily) Continue famotidine - you can take a maximum dose of 40mg  daily  If your symptoms persist, or if your blood sugar does not tolerate the additional medication, follow up here or with PCP for further evaluation.

## 2022-01-24 ENCOUNTER — Ambulatory Visit (HOSPITAL_COMMUNITY)
Admission: EM | Admit: 2022-01-24 | Discharge: 2022-01-24 | Disposition: A | Discharge status: Home / Self Care | Payer: Medicaid Other | Attending: Urgent Care | Admitting: Urgent Care

## 2022-01-24 ENCOUNTER — Telehealth: Payer: Medicaid Other | Admitting: Emergency Medicine

## 2022-01-24 ENCOUNTER — Encounter (HOSPITAL_COMMUNITY): Payer: Self-pay

## 2022-01-24 DIAGNOSIS — M545 Low back pain, unspecified: Secondary | ICD-10-CM | POA: Diagnosis not present

## 2022-01-24 DIAGNOSIS — N3000 Acute cystitis without hematuria: Secondary | ICD-10-CM

## 2022-01-24 LAB — POCT URINALYSIS DIPSTICK, ED / UC
Bilirubin Urine: NEGATIVE
Glucose, UA: 100 mg/dL — AB
Hgb urine dipstick: NEGATIVE
Leukocytes,Ua: NEGATIVE
Nitrite: POSITIVE — AB
Protein, ur: NEGATIVE mg/dL
Specific Gravity, Urine: 1.02 (ref 1.005–1.030)
Urobilinogen, UA: 0.2 mg/dL (ref 0.0–1.0)
pH: 5 (ref 5.0–8.0)

## 2022-01-24 MED ORDER — PHENAZOPYRIDINE HCL 200 MG PO TABS: 200.0000 mg | ORAL_TABLET | Freq: Three times a day (TID) | ORAL | 0 refills | Status: DC

## 2022-01-24 MED ORDER — SULFAMETHOXAZOLE-TRIMETHOPRIM 800-160 MG PO TABS: 1.0000 | ORAL_TABLET | Freq: Two times a day (BID) | ORAL | 0 refills | Status: AC

## 2022-01-24 NOTE — Progress Notes (Signed)
Virtual Visit Consent   Ellen Avery, you are scheduled for a virtual visit with a [facility] provider today.     Just as with appointments in the office, your consent must be obtained to participate.  Your consent will be active for this visit and any virtual visit you may have with one of our providers in the next 365 days.     If you have a MyChart account, a copy of this consent can be sent to you electronically.  All virtual visits are billed to your insurance company just like a traditional visit in the office.    As this is a virtual visit, video technology does not allow for your provider to perform a traditional examination.  This may limit your provider's ability to fully assess your condition.  If your provider identifies any concerns that need to be evaluated in person or the need to arrange testing (such as labs, EKG, etc.), we will make arrangements to do so.     Although advances in technology are sophisticated, we cannot ensure that it will always work on either your end or our end.  If the connection with a video visit is poor, the visit may have to be switched to a telephone visit.  With either a video or telephone visit, we are not always able to ensure that we have a secure connection.     I need to obtain your verbal consent now.   Are you willing to proceed with your visit today?    Ellen Avery has provided verbal consent on 01/24/2022 for a virtual visit (video or telephone).   Montine Circle, PA-C   Date: 01/24/2022 11:44 AM   Virtual Visit via Video Note   I, Montine Circle, connected with  Ellen Avery  ([id number], 1966-12-01) on 01/24/22 at 11:45 AM EST by a video-enabled telemedicine application and verified that I am speaking with the correct person using two identifiers.  Location: Patient: Virtual Visit Location Patient: Home Provider: Virtual Visit Location Provider: Home Office   I discussed the limitations of evaluation and management by  telemedicine and the availability of in person appointments. The patient expressed understanding and agreed to proceed.    History of Present Illness: Ellen Avery is a 56 y.o. who identifies as a female who was assigned female at birth, and is being seen today for low back pain.  States that the pain in the back started last week.  States that she had burning with urination last week, but that has resolved.  Denies fever or chills.  States that the pain is in the low back and runs up her spine.  States that the pain is worsening and keeping her from walking due to the pain.  States that she has been taking Trazadone and Cymbalta for her fibromyalgia.    HPI: HPI  Problems:  Patient Active Problem List   Diagnosis Date Noted   Chest pain in adult 07/20/2019   IDDM (insulin dependent diabetes mellitus) 07/08/2018   HTN (hypertension) 07/08/2018   HLD (hyperlipidemia) 07/08/2018   Altered mental status 03/02/2016   MDD (major depressive disorder), recurrent episode, moderate ([place]) 03/02/2016   Psychoses ([place])     Allergies:  Allergies  Allergen Reactions   Doxycycline Hives and Itching   Flagyl [Metronidazole] Hives and Itching    Benadryl    Medications:  Current Outpatient Medications:    ACCU-CHEK AVIVA PLUS test strip, USE TO CHECK BLOOD SUGAR 4 TIMES DAILY,  Disp: , Rfl: 2   aspirin EC 81 MG tablet, Take 81 mg by mouth daily., Disp: , Rfl:    Cholecalciferol (VITAMIN D3) 3000 units TABS, Take 3,000 Units by mouth daily., Disp: , Rfl:    DULoxetine (CYMBALTA) 60 MG capsule, Take 1 capsule (60 mg total) by mouth daily., Disp: 14 capsule, Rfl: 0   fluticasone (FLONASE) 50 MCG/ACT nasal spray, Place 2 sprays into both nostrils daily., Disp: 16 g, Rfl: 0   gabapentin (NEURONTIN) 100 MG capsule, Take 1-2 capsules (100-200 mg total) by mouth See admin instructions. Take 100 mg in the morning, 100 mg at lunch, and 200 mg in the evening, Disp: 60 capsule, Rfl: 0   hydrOXYzine  (ATARAX/VISTARIL) 25 MG tablet, Take 1 tablet (25 mg total) by mouth every 6 (six) hours as needed for anxiety., Disp: 20 tablet, Rfl: 0   ibuprofen (ADVIL) 800 MG tablet, Take 1 tablet (800 mg total) by mouth every 8 (eight) hours as needed., Disp: 30 tablet, Rfl: 0   insulin aspart (NOVOLOG FLEXPEN) 100 UNIT/ML FlexPen, Inject 4 units subcutaneously three times daily as needed for glucose levels >250., Disp: 3 mL, Rfl: 0   LANTUS SOLOSTAR 100 UNIT/ML Solostar Pen, SMARTSIG:55 Unit(s) SUB-Q Every Night, Disp: , Rfl:    levocetirizine (XYZAL) 5 MG tablet, Take 1 tablet (5 mg total) by mouth every evening., Disp: 30 tablet, Rfl: 0   loperamide (IMODIUM) 2 MG capsule, Take 1 capsule (2 mg total) by mouth 4 (four) times daily as needed for diarrhea or loose stools., Disp: 12 capsule, Rfl: 0   losartan-hydrochlorothiazide (HYZAAR) 100-12.5 MG tablet, Take 1 tablet by mouth daily. , Disp: , Rfl:    metFORMIN (GLUCOPHAGE) 1000 MG tablet, Take 1,000 mg by mouth daily with breakfast. , Disp: , Rfl:    tiZANidine (ZANAFLEX) 4 MG tablet, Take 1 tablet (4 mg total) by mouth every 6 (six) hours as needed for muscle spasms., Disp: 30 tablet, Rfl: 0  Observations/Objective: Patient is well-developed, well-nourished in no acute distress.  Resting comfortably  at home.  Head is normocephalic, atraumatic.  No labored breathing.  Speech is clear and coherent with logical content.  Patient is alert and oriented at baseline.    Assessment and Plan: There are no diagnoses linked to this encounter. - Ice, heat, rest, and walking.   -Continue home meds - In-person follow-up if worsening.  Doubt UTI, cauda equina, or kidney stone.  Follow Up Instructions: I discussed the assessment and treatment plan with the patient. The patient was provided an opportunity to ask questions and all were answered. The patient agreed with the plan and demonstrated an understanding of the instructions.  A copy of instructions were  sent to the patient via MyChart unless otherwise noted below.     The patient was advised to call back or seek an in-person evaluation if the symptoms worsen or if the condition fails to improve as anticipated.  Time:  I spent 10 minutes with the patient via telehealth technology discussing the above problems/concerns.    Montine Circle, PA-C

## 2022-01-24 NOTE — Discharge Instructions (Signed)
Your symptoms are consistent with a urinary tract infection. Start taking the antibiotic twice daily until completed. You can take Pyridium up to 3 times daily for maximum of 2 days, this is only to help with the frequency and the pain.  Take on an as-needed basis. We will send out your urine culture and only call if a change in treatment is necessary. Monitor for any change in or worsening symptoms; fever, hematuria or flank pain would warrant a recheck

## 2022-01-24 NOTE — ED Triage Notes (Signed)
Pt presents with back pain for over a week.

## 2022-01-24 NOTE — Patient Instructions (Signed)
I recommend continuing to apply ice, heat, and walking as much as possible.  If you run a fever or have new or worsening symptoms, please be seen in person.

## 2022-01-25 NOTE — ED Provider Notes (Signed)
[facility]    CSN: [id number] Arrival date & time: 01/24/22  1544      History   Chief Complaint Chief Complaint  Patient presents with   Back Pain    HPI Ellen Avery is a 56 y.o. female.   Pleasant 56 year old female presents today with a 1 week history of lower back pain.  She states that initially started more so in the right upper flank, but states it has been migrating downwards.  She states 1 week ago at the initiation of symptoms, she also had some dysuria.  She states the dysuria has resolved, but is still having urgency and frequency.  She did try Azo x2 days, and states that helped initially, but has not been doing anything since.  She had a video visit with her PCP earlier this morning, and was recommended to follow-up with the urgent care.  No medications were prescribed during the video visit.  She denies any aggravating or alleviating factors apart from the Azo initially.  She denies any abdominal or pelvic pain.  Denies vaginal discharge, hematuria, fever, nausea or vomiting.   Back Pain  Past Medical History:  Diagnosis Date   Anxiety    Arthritis    Chronic headaches    DDD (degenerative disc disease), cervical    Fibromyalgia    GERD (gastroesophageal reflux disease)    History of colon polyps    History of psychosis    Hypercholesteremia    Hypertension    IBS (irritable bowel syndrome)    MDD (major depressive disorder)    Right sided facial pain    Seasonal allergic rhinitis    Sinus congestion    12-24-2018 per pt no fever   Type 2 diabetes mellitus treated with insulin ([place])    Vitamin D deficiency    Wears dentures    full upper,  partial lower   Wears glasses     Patient Active Problem List   Diagnosis Date Noted   Chest pain in adult 07/20/2019   IDDM (insulin dependent diabetes mellitus) 07/08/2018   HTN (hypertension) 07/08/2018   HLD (hyperlipidemia) 07/08/2018   Altered mental status 03/02/2016   MDD (major  depressive disorder), recurrent episode, moderate ([place]) 03/02/2016   Psychoses ([place])     Past Surgical History:  Procedure Laterality Date   ARTERY BIOPSY Right 12/28/2018   Procedure: RIGHT BIOPSY TEMPORAL ARTERY;  Surgeon: Kieth Brightly, Arta Bruce, MD;  Location: [facility];  Service: General;  Laterality: Right;   CARDIAC CATHETERIZATION     MULTIPLE TOOTH EXTRACTIONS  04/ 2019    sedation   TOTAL ABDOMINAL HYSTERECTOMY W/ BILATERAL SALPINGOOPHORECTOMY  2009    OB History   No obstetric history on file.      Home Medications    Prior to Admission medications   Medication Sig Start Date End Date Taking? Authorizing Provider  phenazopyridine (PYRIDIUM) 200 MG tablet Take 1 tablet (200 mg total) by mouth 3 (three) times daily. 01/24/22  Yes Jade Burkard L, PA  sulfamethoxazole-trimethoprim (BACTRIM DS) 800-160 MG tablet Take 1 tablet by mouth 2 (two) times daily for 7 days. 01/24/22 01/31/22 Yes Eutimio Gharibian L, PA  ACCU-CHEK AVIVA PLUS test strip USE TO CHECK BLOOD SUGAR 4 TIMES DAILY 05/25/18   [provider]  aspirin EC 81 MG tablet Take 81 mg by mouth daily.    [provider]  Cholecalciferol (VITAMIN D3) 3000 units TABS Take 3,000 Units by mouth daily.  [provider]  DULoxetine (CYMBALTA) 60 MG capsule Take 1 capsule (60 mg total) by mouth daily. 04/03/20   Sharion Balloon, NP  fluticasone (FLONASE) 50 MCG/ACT nasal spray Place 2 sprays into both nostrils daily. 04/06/21   Brunetta Jeans, PA-C  gabapentin (NEURONTIN) 100 MG capsule Take 1-2 capsules (100-200 mg total) by mouth See admin instructions. Take 100 mg in the morning, 100 mg at lunch, and 200 mg in the evening 04/03/20   Sharion Balloon, NP  hydrOXYzine (ATARAX/VISTARIL) 25 MG tablet Take 1 tablet (25 mg total) by mouth every 6 (six) hours as needed for anxiety. 09/13/18   [name], [name], MD  ibuprofen (ADVIL) 800 MG tablet Take 1 tablet (800 mg total) by mouth every 8 (eight)  hours as needed. 10/21/21   Mar Daring, PA-C  insulin aspart (NOVOLOG FLEXPEN) 100 UNIT/ML FlexPen Inject 4 units subcutaneously three times daily as needed for glucose levels >250. 12/06/21   Marylan Glore L, PA  LANTUS SOLOSTAR 100 UNIT/ML Solostar Pen SMARTSIG:55 Unit(s) SUB-Q Every Night 12/12/19   [provider]  levocetirizine (XYZAL) 5 MG tablet Take 1 tablet (5 mg total) by mouth every evening. 04/06/21   Brunetta Jeans, PA-C  loperamide (IMODIUM) 2 MG capsule Take 1 capsule (2 mg total) by mouth 4 (four) times daily as needed for diarrhea or loose stools. 10/08/21   Hazel Sams, PA-C  losartan-hydrochlorothiazide (HYZAAR) 100-12.5 MG tablet Take 1 tablet by mouth daily.  11/01/18   [provider]  metFORMIN (GLUCOPHAGE) 1000 MG tablet Take 1,000 mg by mouth daily with breakfast.     [provider]  tiZANidine (ZANAFLEX) 4 MG tablet Take 1 tablet (4 mg total) by mouth every 6 (six) hours as needed for muscle spasms. 01/06/21   Jacquelin Hawking, NP    Family History Family History  Problem Relation Age of Onset   Diabetes Mother    Heart disease Paternal Grandmother    Breast cancer Paternal Grandmother    Diabetes Paternal Uncle    Heart Problems Brother    Breast cancer Maternal Aunt    Head & neck cancer Cousin    Cervical cancer Paternal Aunt    Colon cancer Neg Hx    Colon polyps Neg Hx    Esophageal cancer Neg Hx    Rectal cancer Neg Hx    Stomach cancer Neg Hx     Social History Social History   Tobacco Use   Smoking status: Every Day    Packs/day: 1.00    Years: 35.00    Pack years: 35.00    Types: Cigarettes   Smokeless tobacco: Never  Vaping Use   Vaping Use: Never used  Substance Use Topics   Alcohol use: No   Drug use: No     Allergies   Doxycycline and Flagyl [metronidazole]   Review of Systems Review of Systems  Genitourinary:  Positive for frequency and urgency.  Musculoskeletal:  Positive for back  pain.    Physical Exam Triage Vital Signs ED Triage Vitals  Enc Vitals Group     BP 01/24/22 1702 120/73     Pulse Rate 01/24/22 1702 79     Resp 01/24/22 1702 17     Temp 01/24/22 1702 98.5 F (36.9 C)     Temp Source 01/24/22 1702 Oral     SpO2 01/24/22 1702 95 %     Weight --      Height --  Head Circumference --      Peak Flow --      Pain Score 01/24/22 1703 7     Pain Loc --      Pain Edu? --      Excl. in [place]? --    No data found.  Updated Vital Signs BP 120/73 (BP Location: Right Arm)    Pulse 79    Temp 98.5 F (36.9 C) (Oral)    Resp 17    SpO2 95%   Visual Acuity Right Eye Distance:   Left Eye Distance:   Bilateral Distance:    Right Eye Near:   Left Eye Near:    Bilateral Near:     Physical Exam Vitals and nursing note reviewed.  Constitutional:      General: She is not in acute distress.    Appearance: Normal appearance. She is obese. She is not ill-appearing, toxic-appearing or diaphoretic.  HENT:     Head: Normocephalic and atraumatic.  Eyes:     Pupils: Pupils are equal, round, and reactive to light.  Cardiovascular:     Rate and Rhythm: Normal rate.     Pulses: Normal pulses.     Heart sounds: Normal heart sounds. No murmur heard.   No friction rub. No gallop.  Pulmonary:     Effort: Pulmonary effort is normal. No respiratory distress.     Breath sounds: Normal breath sounds. No wheezing or rales.  Chest:     Chest wall: No tenderness.  Abdominal:     General: Abdomen is flat. Bowel sounds are normal. There is no distension.     Palpations: Abdomen is soft. There is no mass.     Tenderness: There is no abdominal tenderness. There is right CVA tenderness. There is no left CVA tenderness, guarding or rebound.     Hernia: No hernia is present.  Musculoskeletal:        General: No swelling, tenderness, deformity or signs of injury. Normal range of motion.     Right lower leg: No edema.     Left lower leg: No edema.     Comments: No  reproducible musculoskeletal pain  Skin:    General: Skin is warm.     Findings: No bruising, erythema or rash.  Neurological:     General: No focal deficit present.     Mental Status: She is alert. Mental status is at baseline.     Cranial Nerves: No cranial nerve deficit.     Sensory: No sensory deficit.     Motor: No weakness.     Gait: Gait normal.  Psychiatric:        Mood and Affect: Mood normal.     UC Treatments / Results  Labs (all labs ordered are listed, but only abnormal results are displayed) Labs Reviewed  POCT URINALYSIS DIPSTICK, ED / UC - Abnormal; Notable for the following components:      Result Value   Glucose, UA 100 (*)    Ketones, ur TRACE (*)    Nitrite POSITIVE (*)    All other components within normal limits    EKG   Radiology No results found.  Procedures Procedures (including critical care time)  Medications Ordered in UC Medications - No data to display  Initial Impression / Assessment and Plan / UC Course  I have reviewed the triage vital signs and the nursing notes.  Pertinent labs & imaging results that were available during my care of the patient were reviewed by  me and considered in my medical decision making (see chart for details).     Acute low back pain - I suspect this to be secondary to her UTI. No reproducible symptoms upon exam. Will recommend heating pad PRN, possible NSAID if not resolved with abx UTI - positive nitrites on dipstick. Will cover for possible developing pyelonephritis given CVA tenderness. Bactrim prescribed BID x 7 days. No hx of sulfa allergy, G6PD or lupus  Final Clinical Impressions(s) / UC Diagnoses   Final diagnoses:  Acute right-sided low back pain without sciatica  Acute cystitis without hematuria     Discharge Instructions      Your symptoms are consistent with a urinary tract infection. Start taking the antibiotic twice daily until completed. You can take Pyridium up to 3 times daily  for maximum of 2 days, this is only to help with the frequency and the pain.  Take on an as-needed basis. We will send out your urine culture and only call if a change in treatment is necessary. Monitor for any change in or worsening symptoms; fever, hematuria or flank pain would warrant a recheck     ED Prescriptions     Medication Sig Dispense Auth. Provider   sulfamethoxazole-trimethoprim (BACTRIM DS) 800-160 MG tablet Take 1 tablet by mouth 2 (two) times daily for 7 days. 14 tablet Rainn Zupko L, PA   phenazopyridine (PYRIDIUM) 200 MG tablet Take 1 tablet (200 mg total) by mouth 3 (three) times daily. 6 tablet Aleecia Tapia L, [place]      PDMP not reviewed this encounter.   Chaney Malling, [place] 01/25/22 1022

## 2022-02-06 ENCOUNTER — Other Ambulatory Visit: Payer: Self-pay

## 2022-02-06 ENCOUNTER — Emergency Department (HOSPITAL_COMMUNITY): Payer: Medicaid Other

## 2022-02-06 ENCOUNTER — Emergency Department (HOSPITAL_COMMUNITY)
Admission: EM | Admit: 2022-02-06 | Discharge: 2022-02-07 | Disposition: A | Discharge status: Home / Self Care | Payer: Medicaid Other | Attending: Emergency Medicine | Admitting: Emergency Medicine

## 2022-02-06 DIAGNOSIS — Z7982 Long term (current) use of aspirin: Secondary | ICD-10-CM | POA: Diagnosis not present

## 2022-02-06 DIAGNOSIS — Z7984 Long term (current) use of oral hypoglycemic drugs: Secondary | ICD-10-CM | POA: Insufficient documentation

## 2022-02-06 DIAGNOSIS — Z79899 Other long term (current) drug therapy: Secondary | ICD-10-CM | POA: Insufficient documentation

## 2022-02-06 DIAGNOSIS — Z794 Long term (current) use of insulin: Secondary | ICD-10-CM | POA: Diagnosis not present

## 2022-02-06 DIAGNOSIS — E119 Type 2 diabetes mellitus without complications: Secondary | ICD-10-CM | POA: Insufficient documentation

## 2022-02-06 DIAGNOSIS — R1084 Generalized abdominal pain: Secondary | ICD-10-CM | POA: Diagnosis not present

## 2022-02-06 DIAGNOSIS — N12 Tubulo-interstitial nephritis, not specified as acute or chronic: Secondary | ICD-10-CM | POA: Diagnosis not present

## 2022-02-06 DIAGNOSIS — R109 Unspecified abdominal pain: Secondary | ICD-10-CM | POA: Diagnosis not present

## 2022-02-06 DIAGNOSIS — R350 Frequency of micturition: Secondary | ICD-10-CM

## 2022-02-06 DIAGNOSIS — I10 Essential (primary) hypertension: Secondary | ICD-10-CM | POA: Diagnosis not present

## 2022-02-06 DIAGNOSIS — N200 Calculus of kidney: Secondary | ICD-10-CM | POA: Diagnosis not present

## 2022-02-06 DIAGNOSIS — K760 Fatty (change of) liver, not elsewhere classified: Secondary | ICD-10-CM | POA: Diagnosis not present

## 2022-02-06 DIAGNOSIS — R739 Hyperglycemia, unspecified: Secondary | ICD-10-CM | POA: Diagnosis not present

## 2022-02-06 LAB — URINALYSIS, ROUTINE W REFLEX MICROSCOPIC
Bacteria, UA: NONE SEEN
Bilirubin Urine: NEGATIVE
Glucose, UA: NEGATIVE mg/dL
Hgb urine dipstick: NEGATIVE
Ketones, ur: NEGATIVE mg/dL
Leukocytes,Ua: NEGATIVE
Nitrite: POSITIVE — AB
Protein, ur: NEGATIVE mg/dL
Specific Gravity, Urine: 1.016 (ref 1.005–1.030)
pH: 5 (ref 5.0–8.0)

## 2022-02-06 LAB — CBC
HCT: 36.2 % (ref 36.0–46.0)
Hemoglobin: 11.9 g/dL — ABNORMAL LOW (ref 12.0–15.0)
MCH: 27.9 pg (ref 26.0–34.0)
MCHC: 32.9 g/dL (ref 30.0–36.0)
MCV: 85 fL (ref 80.0–100.0)
Platelets: 324 10*3/uL (ref 150–400)
RBC: 4.26 MIL/uL (ref 3.87–5.11)
RDW: 12.6 % (ref 11.5–15.5)
WBC: 8.5 10*3/uL (ref 4.0–10.5)
nRBC: 0 % (ref 0.0–0.2)

## 2022-02-06 LAB — BASIC METABOLIC PANEL
Anion gap: 9 (ref 5–15)
BUN: 15 mg/dL (ref 6–20)
CO2: 26 mmol/L (ref 22–32)
Calcium: 9.8 mg/dL (ref 8.9–10.3)
Chloride: 104 mmol/L (ref 98–111)
Creatinine, Ser: 0.93 mg/dL (ref 0.44–1.00)
GFR, Estimated: >60 mL/min (ref >60)
Glucose, Bld: 221 mg/dL — ABNORMAL HIGH (ref 70–99)
Potassium: 3.9 mmol/L (ref 3.5–5.1)
Sodium: 139 mmol/L (ref 135–145)

## 2022-02-06 MED ORDER — DIPHENHYDRAMINE HCL 50 MG/ML IJ SOLN
25.0000 mg | Freq: Once | INTRAMUSCULAR | Status: AC
  Administered 2022-02-07: 25 mg via INTRAVENOUS
  Filled 2022-02-06: qty 1

## 2022-02-06 MED ORDER — IOHEXOL 300 MG/ML  SOLN
100.0000 mL | Freq: Once | INTRAMUSCULAR | Status: AC | PRN
  Administered 2022-02-06: 100 mL via INTRAVENOUS

## 2022-02-06 MED ORDER — MORPHINE SULFATE (PF) 4 MG/ML IV SOLN
4.0000 mg | Freq: Once | INTRAVENOUS | Status: AC
  Administered 2022-02-06: 4 mg via INTRAVENOUS
  Filled 2022-02-06: qty 1

## 2022-02-06 MED ORDER — ONDANSETRON HCL 4 MG/2ML IJ SOLN
4.0000 mg | Freq: Once | INTRAMUSCULAR | Status: AC
  Administered 2022-02-06: 4 mg via INTRAVENOUS
  Filled 2022-02-06: qty 2

## 2022-02-06 NOTE — ED Provider Triage Note (Signed)
Emergency Medicine Provider Triage Evaluation Note ? ?Ellen Avery , a 56 y.o. female  was evaluated in triage.  Pt complains of continued dysuria.  Diagnosed and treated for UTI by UC.  Finished 7d course of bactrim yesterday.  Still has suprapubic pain which now includes her lower back.  Denies urinary/bowel incontinence or saddle anesthesia. ? ?Review of Systems  ?Positive: Dysuria, abdominal pain ?Negative: Fever, constipation ? ?Physical Exam  ?BP 124/82 (BP Location: Left Arm)   Pulse 83   Temp 99.3 ?F (37.4 ?C) (Oral)   Resp 16   SpO2 98%  ?Gen:   Awake, no distress   ?Resp:  Normal effort  ?MSK:   Moves extremities without difficulty  ?Other:  Suprapubic pain ? ?Medical Decision Making  ?Medically screening exam initiated at 3:39 PM.  Appropriate orders placed.  Ellen Avery was informed that the remainder of the evaluation will be completed by another provider, this initial triage assessment does not replace that evaluation, and the importance of remaining in the ED until their evaluation is complete. ? ?Labs ordered ?  ?Prince Rome, PA-C ?62/03/55 1545 ? ?

## 2022-02-06 NOTE — ED Triage Notes (Signed)
Pt here for continued burning with urination and back pain even after completing a course of Bactrim yesterday which was prescribed at [facility].  ?

## 2022-02-06 NOTE — ED Notes (Signed)
Patient transported to CT 

## 2022-02-06 NOTE — ED Provider Notes (Signed)
[facility] EMERGENCY DEPARTMENT Provider Note   CSN: [id number] Arrival date & time: 02/06/22  1508     History  Chief Complaint  Patient presents with   Urinary Tract Infection    Ellen Avery is a 56 y.o. female.  The history is provided by the patient and medical records. No language interpreter was used.  Urinary Tract Infection Pain quality:  Aching Pain severity:  Moderate Onset quality:  Gradual Duration:  1 week Timing:  Constant Progression:  Worsening Chronicity:  New Relieved by:  Nothing Worsened by:  Nothing Ineffective treatments:  None tried Urinary symptoms: frequent urination   Associated symptoms: flank pain   Associated symptoms: no abdominal pain, no fever, no nausea and no vomiting       Home Medications Prior to Admission medications   Medication Sig Start Date End Date Taking? Authorizing Provider  ACCU-CHEK AVIVA PLUS test strip USE TO CHECK BLOOD SUGAR 4 TIMES DAILY 05/25/18   [provider]  aspirin EC 81 MG tablet Take 81 mg by mouth daily.    [provider]  Cholecalciferol (VITAMIN D3) 3000 units TABS Take 3,000 Units by mouth daily.    [provider]  DULoxetine (CYMBALTA) 60 MG capsule Take 1 capsule (60 mg total) by mouth daily. 04/03/20   Sharion Balloon, NP  fluticasone (FLONASE) 50 MCG/ACT nasal spray Place 2 sprays into both nostrils daily. 04/06/21   Brunetta Jeans, PA-C  gabapentin (NEURONTIN) 100 MG capsule Take 1-2 capsules (100-200 mg total) by mouth See admin instructions. Take 100 mg in the morning, 100 mg at lunch, and 200 mg in the evening 04/03/20   Sharion Balloon, NP  hydrOXYzine (ATARAX/VISTARIL) 25 MG tablet Take 1 tablet (25 mg total) by mouth every 6 (six) hours as needed for anxiety. 09/13/18   [name], [name], MD  ibuprofen (ADVIL) 800 MG tablet Take 1 tablet (800 mg total) by mouth every 8 (eight) hours as needed. 10/21/21   Mar Daring, PA-C  insulin aspart  (NOVOLOG FLEXPEN) 100 UNIT/ML FlexPen Inject 4 units subcutaneously three times daily as needed for glucose levels >250. 12/06/21   Crain, Whitney L, PA  LANTUS SOLOSTAR 100 UNIT/ML Solostar Pen SMARTSIG:55 Unit(s) SUB-Q Every Night 12/12/19   [provider]  levocetirizine (XYZAL) 5 MG tablet Take 1 tablet (5 mg total) by mouth every evening. 04/06/21   Brunetta Jeans, PA-C  loperamide (IMODIUM) 2 MG capsule Take 1 capsule (2 mg total) by mouth 4 (four) times daily as needed for diarrhea or loose stools. 10/08/21   Hazel Sams, PA-C  losartan-hydrochlorothiazide (HYZAAR) 100-12.5 MG tablet Take 1 tablet by mouth daily.  11/01/18   [provider]  metFORMIN (GLUCOPHAGE) 1000 MG tablet Take 1,000 mg by mouth daily with breakfast.     [provider]  phenazopyridine (PYRIDIUM) 200 MG tablet Take 1 tablet (200 mg total) by mouth 3 (three) times daily. 01/24/22   Crain, Whitney L, PA  tiZANidine (ZANAFLEX) 4 MG tablet Take 1 tablet (4 mg total) by mouth every 6 (six) hours as needed for muscle spasms. 01/06/21   Jacquelin Hawking, NP      Allergies    Doxycycline and Flagyl [metronidazole]    Review of Systems   Review of Systems  Constitutional:  Positive for fatigue. Negative for chills and fever.  HENT:  Negative for congestion.   Respiratory:  Negative for cough, chest tightness and shortness of breath.  Cardiovascular:  Negative for chest pain.  Gastrointestinal:  Negative for abdominal pain, constipation, diarrhea, nausea and vomiting.  Genitourinary:  Positive for flank pain. Negative for dysuria (reslved now) and hematuria.  Musculoskeletal:  Positive for back pain. Negative for neck pain and neck stiffness.  Skin:  Negative for rash and wound.  Neurological:  Negative for light-headedness, numbness and headaches.  Psychiatric/Behavioral:  Negative for agitation.   All other systems reviewed and are negative.  Physical Exam Updated Vital Signs BP  136/89 (BP Location: Left Arm)    Pulse 75    Temp 98.4 F (36.9 C) (Oral)    Resp 15    Ht 5\' 6"  (1.676 m)    Wt 99.8 kg    SpO2 100%    BMI 35.51 kg/m  Physical Exam Vitals and nursing note reviewed.  Constitutional:      General: She is not in acute distress.    Appearance: She is well-developed. She is not ill-appearing, toxic-appearing or diaphoretic.  HENT:     Head: Normocephalic and atraumatic.  Eyes:     Conjunctiva/sclera: Conjunctivae normal.  Cardiovascular:     Rate and Rhythm: Normal rate and regular rhythm.     Heart sounds: No murmur heard. Pulmonary:     Effort: Pulmonary effort is normal. No respiratory distress.     Breath sounds: Normal breath sounds. No wheezing, rhonchi or rales.  Chest:     Chest wall: No tenderness.  Abdominal:     General: Abdomen is flat.     Palpations: Abdomen is soft.     Tenderness: There is no abdominal tenderness. There is right CVA tenderness. There is no left CVA tenderness, guarding or rebound.  Musculoskeletal:        General: Tenderness present. No swelling.     Cervical back: Neck supple. No tenderness.     Right lower leg: No edema.     Left lower leg: No edema.  Skin:    General: Skin is warm and dry.     Capillary Refill: Capillary refill takes less than 2 seconds.     Findings: No erythema or rash.  Neurological:     General: No focal deficit present.     Mental Status: She is alert.     Sensory: No sensory deficit.     Motor: No weakness.  Psychiatric:        Mood and Affect: Mood normal.    ED Results / Procedures / Treatments   Labs (all labs ordered are listed, but only abnormal results are displayed) Labs Reviewed  URINALYSIS, ROUTINE W REFLEX MICROSCOPIC - Abnormal; Notable for the following components:      Result Value   Color, Urine AMBER (*)    Nitrite POSITIVE (*)    All other components within normal limits  CBC - Abnormal; Notable for the following components:   Hemoglobin 11.9 (*)    All  other components within normal limits  BASIC METABOLIC PANEL - Abnormal; Notable for the following components:   Glucose, Bld 221 (*)    All other components within normal limits  HEPATIC FUNCTION PANEL - Abnormal; Notable for the following components:   AST 14 (*)    Total Bilirubin 0.2 (*)    All other components within normal limits  URINE CULTURE    EKG None  Radiology CT ABDOMEN PELVIS W CONTRAST  Result Date: 02/06/2022 CLINICAL DATA:  Right flank pain, kidney stone suspected. Recently treated for pyelonephritis with worsening symptoms. EXAM:  CT ABDOMEN AND PELVIS WITH CONTRAST TECHNIQUE: Multidetector CT imaging of the abdomen and pelvis was performed using the standard protocol following bolus administration of intravenous contrast. RADIATION DOSE REDUCTION: This exam was performed according to the departmental dose-optimization program which includes automated exposure control, adjustment of the mA and/or kV according to patient size and/or use of iterative reconstruction technique. CONTRAST:  134mL OMNIPAQUE IOHEXOL 300 MG/ML  SOLN COMPARISON:  None. FINDINGS: Lower chest: No acute abnormality. Hepatobiliary: No focal liver abnormality. Fatty infiltration of the liver is noted. No biliary ductal dilatation. The gallbladder is without stones. Pancreas: Unremarkable. No pancreatic ductal dilatation or surrounding inflammatory changes. Spleen: Normal in size without focal abnormality. Adrenals/Urinary Tract: No adrenal nodule or mass. No renal calculus or hydronephrosis. The kidneys enhance symmetrically. No ureteral calculus or obstructive uropathy. A complex hypodensity is present in the lower pole of the right kidney measuring 1.1 cm. The bladder is unremarkable. Stomach/Bowel: Stomach is within normal limits. Appendix appears normal. No evidence of bowel wall thickening, distention, or inflammatory changes. No free air or pneumatosis. Vascular/Lymphatic: No significant vascular findings  are present. No enlarged abdominal or pelvic lymph nodes. Reproductive: Status post hysterectomy. No adnexal masses. Other: There is diastasis of rectus abdominus with a small fat containing umbilical hernia. No ascites. Musculoskeletal: No acute or significant osseous findings. IMPRESSION: 1. No renal ureteral calculus or obstructive uropathy bilaterally. 2. Indeterminate complex hypodensity in the lower pole the right kidney. Ultrasound is recommended for further characterization on follow-up. 3. Hepatic steatosis. Electronically Signed   By: Brett Fairy M.D.   On: 02/06/2022 23:05    Procedures Procedures    Medications Ordered in ED Medications  levofloxacin (LEVAQUIN) tablet 750 mg (has no administration in time range)  morphine (PF) 4 MG/ML injection 4 mg (4 mg Intravenous Given 02/06/22 2228)  ondansetron (ZOFRAN) injection 4 mg (4 mg Intravenous Given 02/06/22 2234)  iohexol (OMNIPAQUE) 300 MG/ML solution 100 mL (100 mLs Intravenous Contrast Given 02/06/22 2251)  diphenhydrAMINE (BENADRYL) injection 25 mg (25 mg Intravenous Given 02/07/22 0005)    ED Course/ Medical Decision Making/ A&P                           Medical Decision Making Amount and/or Complexity of Data Reviewed Labs: ordered. Radiology: ordered.  Risk Prescription drug management.    TANEQUA KRETZ is a 56 y.o. female with a past medical history significant for hypertension, diabetes, degenerative changes, fibromyalgia, irritable bowel syndrome, and recent completion of antibiotics for suspected pyelonephritis who presents with worsened right flank pain, right back pain, and urinary frequency.  She reports that she completed Bactrim yesterday and had worsened symptoms.  She is not having significant fevers but is having some fatigue and chills.  She is reporting significant pain in her right flank and right back as well as having frequency.  She reports the dysuria has improved but she is not feeling better overall.  She  reports no nausea, vomiting, constipation, diarrhea,.  She denies any trauma.  Denies any rashes to suggest shingles.  Denies history of this.  Denies history of kidney stones, gallbladder disease, or previous renal abnormalities.  On exam, patient is tender in her right flank but no rash seen.  Not tender in the midline.  Abdomen nontender and bowel sounds were appreciated.  She did not report any vaginal symptoms.  Lungs clear and chest nontender.  Patient appears uncomfortable.  As patient is finished antibiotics,  I am concerned that patient either has persistent pyelonephritis, renal abscess, or even a gallbladder etiology wraparound causing the right flank pain that did not respond to the initial antibiotics for urine.  We will add on a urine culture as her urine from triage does show continued nitrites.  We will add on hepatic function and give her some pain medicine.  After speaking with the patient, we will get a CT scan to rule out renal abscess, stone, or even a gallbladder etiology of symptoms.  If work-up is reassuring, anticipate discharge with different antibiotic and pain medicine and follow-up with PCP.  11:53 PM CT scan returned showing no evidence of acute cholecystitis or appendicitis.  No evidence of kidney stone.  No abscesses seen.  Hepatic steatosis present there is a hypodensity on the right kidney which needs follow-up as an outpatient for repeat ultrasound.  After hepatic function panel returns, anticipate discharge home with new antibiotic for further management of suspected persistent pyelonephritis.  12:19 AM CT scan returned showing no evidence of kidney stone or infected stone.  There was a small hypodensity on the right side that recommend outpatient ultrasound for follow-up.  Patient is not describing any trauma or having history of clotting to suggest a traumatic or ischemic injury to the kidney.  Renal function normal.  Hepatic function normal.  We feel patient is  safe for discharge home with Levaquin to escalate antibiotics and pain medicine.  She will follow-up with PCP for outpatient ultrasound of the kidneys.  She agrees with plan of care and had no other questions or concerns.  Patient discharged in good condition with improved symptoms.          Final Clinical Impression(s) / ED Diagnoses Final diagnoses:  Pyelonephritis  Right flank pain  Urinary frequency    Rx / DC Orders ED Discharge Orders          Ordered    levofloxacin (LEVAQUIN) 750 MG tablet  Daily        02/07/22 0024    oxyCODONE-acetaminophen (PERCOCET/ROXICET) 5-325 MG tablet  Every 6 hours PRN        02/07/22 0024            Clinical Impression: 1. Pyelonephritis   2. Right flank pain   3. Urinary frequency     Disposition: Discharge  Condition: Good  I have discussed the results, Dx and Tx plan with the pt(& family if present). He/she/they expressed understanding and agree(s) with the plan. Discharge instructions discussed at great length. Strict return precautions discussed and pt &/or family have verbalized understanding of the instructions. No further questions at time of discharge.    New Prescriptions   LEVOFLOXACIN (LEVAQUIN) 750 MG TABLET    Take 1 tablet (750 mg total) by mouth daily for 5 days.   OXYCODONE-ACETAMINOPHEN (PERCOCET/ROXICET) 5-325 MG TABLET    Take 1 tablet by mouth every 6 (six) hours as needed for severe pain.    Follow Up: Nolene Ebbs, MD [place] [address] [phone]     [facility] EMERGENCY DEPARTMENT [address] [id number] mc [place]       Zandria Woldt, Gwenyth Allegra, MD 02/07/22 [phone]

## 2022-02-07 LAB — HEPATIC FUNCTION PANEL
ALT: 15 U/L (ref 0–44)
AST: 14 U/L — ABNORMAL LOW (ref 15–41)
Albumin: 3.7 g/dL (ref 3.5–5.0)
Alkaline Phosphatase: 76 U/L (ref 38–126)
Bilirubin, Direct: <0.1 mg/dL (ref 0.0–0.2)
Total Bilirubin: 0.2 mg/dL — ABNORMAL LOW (ref 0.3–1.2)
Total Protein: 6.7 g/dL (ref 6.5–8.1)

## 2022-02-07 MED ORDER — LEVOFLOXACIN 750 MG PO TABS
750.0000 mg | ORAL_TABLET | Freq: Once | ORAL | Status: AC
  Administered 2022-02-07: 750 mg via ORAL
  Filled 2022-02-07: qty 1

## 2022-02-07 MED ORDER — OXYCODONE-ACETAMINOPHEN 5-325 MG PO TABS: 1.0000 | ORAL_TABLET | Freq: Four times a day (QID) | ORAL | 0 refills | Status: DC | PRN

## 2022-02-07 MED ORDER — LEVOFLOXACIN 750 MG PO TABS: 750.0000 mg | ORAL_TABLET | Freq: Every day | ORAL | 0 refills | Status: AC

## 2022-02-07 NOTE — ED Notes (Signed)
Pt discharged and ambulated out of the ED without difficulty. 

## 2022-02-07 NOTE — Discharge Instructions (Signed)
Your history, exam, work-up today are consistent with recurrent pyelonephritis.  The CT scan did not show evidence of infected stone but did show the hypodensity on the right kidney.  Please follow-up with your primary doctor for this for likely outpatient ultrasound.  Please take the antibiotics and pain medicine and maintain hydration.  Please rest.  If any symptoms change or worsen acutely, please return to the nearest emergency department. ?

## 2022-02-09 LAB — URINE CULTURE: Culture: <10000 — AB

## 2022-02-24 IMAGING — XA DG INJECT/[PERSON_NAME] INC NEEDLE/CATH/PLC EPI/CERV/THOR W/IMG
2 series · 2 of 2 positions shown · non-contrast
Comparison: none

CLINICAL DATA: Neck pain.

[Series 1: ortho standard · 1 of 1 slices shown (1 of 2)]
[im 1/1]
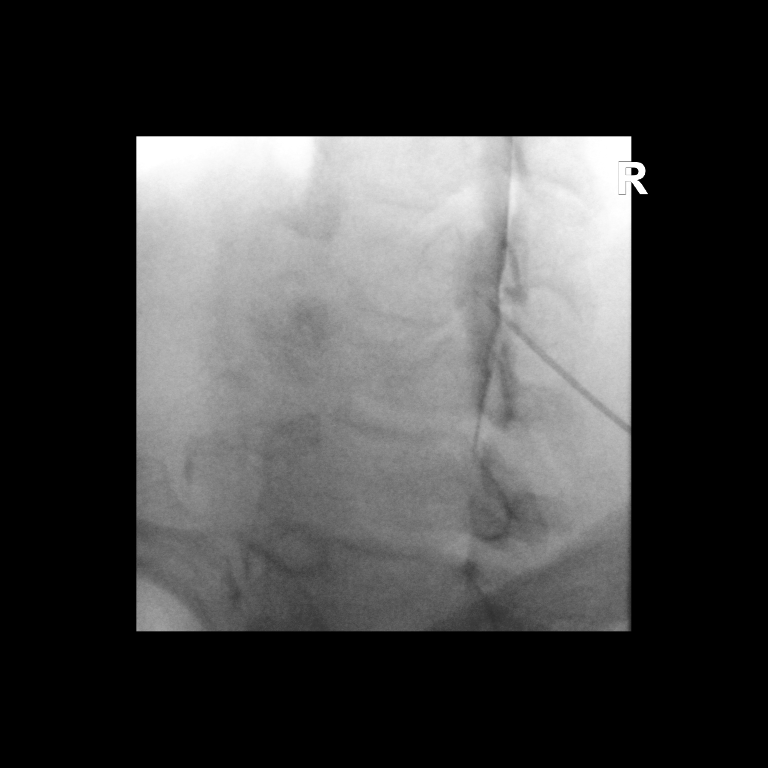

[Series 2: ortho standard · 1 of 1 slices shown (2 of 2)]
[im 1/1]
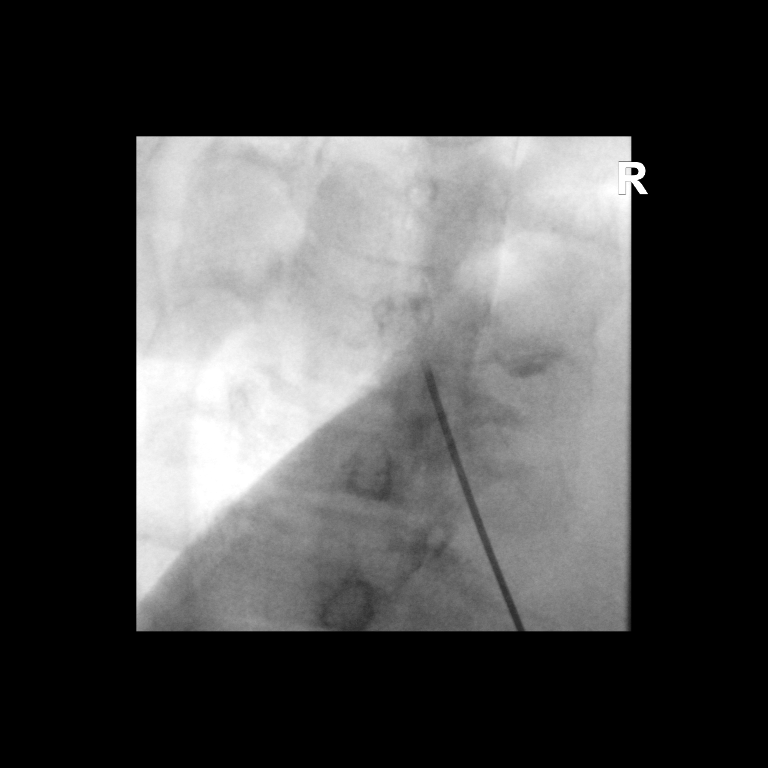

[2 of 2 positions shown; findings below may reference images not displayed]

FLUOROSCOPY TIME:  Radiation Exposure Index (as provided by the
fluoroscopic device): 38.94 uGy*m2

PROCEDURE:
CERVICAL EPIDURAL INJECTION

An interlaminar approach was performed on the right at C5-6. A 20
gauge epidural needle was advanced using loss-of-resistance
technique.

DIAGNOSTIC EPIDURAL INJECTION

Injection of Isovue-M 300 shows a good epidural pattern with spread
above and below the level of needle placement, primarily on the
right. No vascular opacification is seen. THERAPEUTIC

EPIDURAL INJECTION

1.5 ml of Kenalog 40 mixed with 1 ml of 1% Lidocaine and 2 ml of
normal saline were then instilled. The procedure was well-tolerated,
and the patient was discharged thirty minutes following the
injection in good condition.
IMPRESSION: Technically successful first epidural injection on the right at
C5-6.

## 2022-03-04 ENCOUNTER — Telehealth: Payer: Medicaid Other | Admitting: Physician Assistant

## 2022-03-04 DIAGNOSIS — G43809 Other migraine, not intractable, without status migrainosus: Secondary | ICD-10-CM | POA: Diagnosis not present

## 2022-03-04 MED ORDER — SUMATRIPTAN SUCCINATE 25 MG PO TABS: 25.0000 mg | ORAL_TABLET | Freq: Once | ORAL | 0 refills | Status: DC

## 2022-03-04 NOTE — Progress Notes (Signed)
?Virtual Visit Consent  ? ?Ellen Avery, you are scheduled for a virtual visit with a [facility] provider today.   ?  ?Just as with appointments in the office, your consent must be obtained to participate.  Your consent will be active for this visit and any virtual visit you may have with one of our providers in the next 365 days.   ?  ?If you have a MyChart account, a copy of this consent can be sent to you electronically.  All virtual visits are billed to your insurance company just like a traditional visit in the office.   ? ?As this is a virtual visit, video technology does not allow for your provider to perform a traditional examination.  This may limit your provider's ability to fully assess your condition.  If your provider identifies any concerns that need to be evaluated in person or the need to arrange testing (such as labs, EKG, etc.), we will make arrangements to do so.   ?  ?Although advances in technology are sophisticated, we cannot ensure that it will always work on either your end or our end.  If the connection with a video visit is poor, the visit may have to be switched to a telephone visit.  With either a video or telephone visit, we are not always able to ensure that we have a secure connection.    ? ?I need to obtain your verbal consent now.   Are you willing to proceed with your visit today?  ?  ?HAGAN MALTZ has provided verbal consent on 03/04/2022 for a virtual visit (video or telephone). ?  ?[name], PA-C  ? ?Date: 03/04/2022 3:05 PM ? ? ?Virtual Visit via Video Note  ? ?ILeeanne Rio, connected with  Ellen Avery  ([id number], August 06, 1966) on 03/04/22 at  2:45 PM EDT by a video-enabled telemedicine application and verified that I am speaking with the correct person using two identifiers. ? ?Location: ?Patient: Virtual Visit Location Patient: Home ?Provider: Virtual Visit Location Provider: Home Office ?  ?I discussed the limitations of evaluation and management by  telemedicine and the availability of in person appointments. The patient expressed understanding and agreed to proceed.   ? ?History of Present Illness: ?Ellen Avery is a 56 y.o. who identifies as a female who was assigned female at birth, and is being seen today for headache for 2 days that is R frontal and associated with photophobia, phonophobia. Denies any mental status changes, lightheadedness or dizziness. Denies sinus pressure or pain. Has history of migraines but has been some time since an acute headache.  ? ?HPI: HPI  ?Problems:  ?Patient Active Problem List  ? Diagnosis Date Noted  ? Chest pain in adult 07/20/2019  ? IDDM (insulin dependent diabetes mellitus) 07/08/2018  ? HTN (hypertension) 07/08/2018  ? HLD (hyperlipidemia) 07/08/2018  ? Altered mental status 03/02/2016  ? MDD (major depressive disorder), recurrent episode, moderate ([place]) 03/02/2016  ? Psychoses ([place])   ?  ?Allergies:  ?Allergies  ?Allergen Reactions  ? Doxycycline Hives and Itching  ? Flagyl [Metronidazole] Hives and Itching  ?  Benadryl   ? ?Medications:  ?Current Outpatient Medications:  ?  SUMAtriptan (IMITREX) 25 MG tablet, Take 1 tablet (25 mg total) by mouth once for 1 dose. May repeat in 2 hours if headache persists or recurs., Disp: 10 tablet, Rfl: 0 ?  ACCU-CHEK AVIVA PLUS test strip, Check blood sugar 3-4 times daily, Disp: , Rfl: 2 ?  acetaminophen (TYLENOL) 650 MG CR tablet, Take 1,300 mg by mouth daily., Disp: , Rfl:  ?  aspirin EC 81 MG tablet, Take 81 mg by mouth daily., Disp: , Rfl:  ?  Cholecalciferol (VITAMIN D3) 3000 units TABS, Take 3,000 Units by mouth daily as needed (vitamin d)., Disp: , Rfl:  ?  DULoxetine (CYMBALTA) 60 MG capsule, Take 1 capsule (60 mg total) by mouth daily., Disp: 14 capsule, Rfl: 0 ?  famotidine (PEPCID) 20 MG tablet, Take 20 mg by mouth every morning., Disp: , Rfl:  ?  fenofibrate (TRICOR) 48 MG tablet, Take 48 mg by mouth daily., Disp: , Rfl:  ?  fluticasone (FLONASE) 50 MCG/ACT nasal  spray, Place 2 sprays into both nostrils daily. (Patient taking differently: Place 2 sprays into both nostrils daily as needed for allergies or rhinitis.), Disp: 16 g, Rfl: 0 ?  gabapentin (NEURONTIN) 100 MG capsule, Take 1-2 capsules (100-200 mg total) by mouth See admin instructions. Take 100 mg in the morning, 100 mg at lunch, and 200 mg in the evening (Patient not taking: Reported on 02/06/2022), Disp: 60 capsule, Rfl: 0 ?  gabapentin (NEURONTIN) 300 MG capsule, Take 300 mg by mouth 2 (two) times daily., Disp: , Rfl:  ?  hydrOXYzine (ATARAX/VISTARIL) 25 MG tablet, Take 1 tablet (25 mg total) by mouth every 6 (six) hours as needed for anxiety., Disp: 20 tablet, Rfl: 0 ?  LANTUS SOLOSTAR 100 UNIT/ML Solostar Pen, Inject 75 Units into the skin at bedtime., Disp: , Rfl:  ?  loratadine (CLARITIN) 10 MG tablet, Take 10 mg by mouth daily as needed for allergies., Disp: , Rfl:  ?  losartan-hydrochlorothiazide (HYZAAR) 100-12.5 MG tablet, Take 1 tablet by mouth daily. , Disp: , Rfl:  ?  metFORMIN (GLUCOPHAGE) 1000 MG tablet, Take 1,000 mg by mouth 2 (two) times daily with a meal., Disp: , Rfl:  ?  oxyCODONE-acetaminophen (PERCOCET/ROXICET) 5-325 MG tablet, Take 1 tablet by mouth every 6 (six) hours as needed for severe pain., Disp: 15 tablet, Rfl: 0 ?  rosuvastatin (CRESTOR) 20 MG tablet, Take 20 mg by mouth at bedtime., Disp: , Rfl:  ?  tiZANidine (ZANAFLEX) 4 MG tablet, Take 1 tablet (4 mg total) by mouth every 6 (six) hours as needed for muscle spasms., Disp: 30 tablet, Rfl: 0 ? ?Observations/Objective: ?Patient is well-developed, well-nourished in no acute distress.  ?Resting comfortably at home.  ?Head is normocephalic, atraumatic.  ?No labored breathing. ?Speech is clear and coherent with logical content.  ?Patient is alert and oriented at baseline.  ? ?Assessment and Plan: ?1. Other migraine without status migrainosus, not intractable ?- SUMAtriptan (IMITREX) 25 MG tablet; Take 1 tablet (25 mg total) by mouth once  for 1 dose. May repeat in 2 hours if headache persists or recurs.  Dispense: 10 tablet; Refill: 0 ? ?Start Excedrin migraine OTC. Rest and hydration recommended. If not aborting, she is to use Imitrex as directed -- no more than as directed. If not fully resolving, will need in-person evaluation ASAP.  ? ?Follow Up Instructions: ?I discussed the assessment and treatment plan with the patient. The patient was provided an opportunity to ask questions and all were answered. The patient agreed with the plan and demonstrated an understanding of the instructions.  A copy of instructions were sent to the patient via MyChart unless otherwise noted below.  ? ?The patient was advised to call back or seek an in-person evaluation if the symptoms worsen or if the condition fails to improve  as anticipated. ? ?Time:  ?I spent 10 minutes with the patient via telehealth technology discussing the above problems/concerns.   ? ?[name], PA-C ?

## 2022-03-04 NOTE — Patient Instructions (Signed)
?[name], thank you for joining [name], PA-C for today's virtual visit.  While this provider is not your primary care provider (PCP), if your PCP is located in our provider database this encounter information will be shared with them immediately following your visit. ? ?Consent: ?(Patient) Ellen Avery provided verbal consent for this virtual visit at the beginning of the encounter. ? ?Current Medications: ? ?Current Outpatient Medications:  ?  SUMAtriptan (IMITREX) 25 MG tablet, Take 1 tablet (25 mg total) by mouth once for 1 dose. May repeat in 2 hours if headache persists or recurs., Disp: 10 tablet, Rfl: 0 ?  ACCU-CHEK AVIVA PLUS test strip, Check blood sugar 3-4 times daily, Disp: , Rfl: 2 ?  acetaminophen (TYLENOL) 650 MG CR tablet, Take 1,300 mg by mouth daily., Disp: , Rfl:  ?  aspirin EC 81 MG tablet, Take 81 mg by mouth daily., Disp: , Rfl:  ?  Cholecalciferol (VITAMIN D3) 3000 units TABS, Take 3,000 Units by mouth daily as needed (vitamin d)., Disp: , Rfl:  ?  DULoxetine (CYMBALTA) 60 MG capsule, Take 1 capsule (60 mg total) by mouth daily., Disp: 14 capsule, Rfl: 0 ?  famotidine (PEPCID) 20 MG tablet, Take 20 mg by mouth every morning., Disp: , Rfl:  ?  fenofibrate (TRICOR) 48 MG tablet, Take 48 mg by mouth daily., Disp: , Rfl:  ?  fluticasone (FLONASE) 50 MCG/ACT nasal spray, Place 2 sprays into both nostrils daily. (Patient taking differently: Place 2 sprays into both nostrils daily as needed for allergies or rhinitis.), Disp: 16 g, Rfl: 0 ?  gabapentin (NEURONTIN) 100 MG capsule, Take 1-2 capsules (100-200 mg total) by mouth See admin instructions. Take 100 mg in the morning, 100 mg at lunch, and 200 mg in the evening (Patient not taking: Reported on 02/06/2022), Disp: 60 capsule, Rfl: 0 ?  gabapentin (NEURONTIN) 300 MG capsule, Take 300 mg by mouth 2 (two) times daily., Disp: , Rfl:  ?  hydrOXYzine (ATARAX/VISTARIL) 25 MG tablet, Take 1 tablet (25 mg total) by mouth every 6 (six)  hours as needed for anxiety., Disp: 20 tablet, Rfl: 0 ?  LANTUS SOLOSTAR 100 UNIT/ML Solostar Pen, Inject 75 Units into the skin at bedtime., Disp: , Rfl:  ?  loratadine (CLARITIN) 10 MG tablet, Take 10 mg by mouth daily as needed for allergies., Disp: , Rfl:  ?  losartan-hydrochlorothiazide (HYZAAR) 100-12.5 MG tablet, Take 1 tablet by mouth daily. , Disp: , Rfl:  ?  metFORMIN (GLUCOPHAGE) 1000 MG tablet, Take 1,000 mg by mouth 2 (two) times daily with a meal., Disp: , Rfl:  ?  oxyCODONE-acetaminophen (PERCOCET/ROXICET) 5-325 MG tablet, Take 1 tablet by mouth every 6 (six) hours as needed for severe pain., Disp: 15 tablet, Rfl: 0 ?  rosuvastatin (CRESTOR) 20 MG tablet, Take 20 mg by mouth at bedtime., Disp: , Rfl:  ?  tiZANidine (ZANAFLEX) 4 MG tablet, Take 1 tablet (4 mg total) by mouth every 6 (six) hours as needed for muscle spasms., Disp: 30 tablet, Rfl: 0  ? ?Medications ordered in this encounter:  ?Meds ordered this encounter  ?Medications  ? SUMAtriptan (IMITREX) 25 MG tablet  ?  Sig: Take 1 tablet (25 mg total) by mouth once for 1 dose. May repeat in 2 hours if headache persists or recurs.  ?  Dispense:  10 tablet  ?  Refill:  0  ?  Order Specific Question:   Supervising Provider  ?  Answer:   MILLER,  BRIAN [[id number]]  ?  ? ?*If you need refills on other medications prior to your next appointment, please contact your pharmacy* ? ?Follow-Up: ?Call back or seek an in-person evaluation if the symptoms worsen or if the condition fails to improve as anticipated. ? ?Other Instructions ? ? ?If you have been instructed to have an in-person evaluation today at a local Urgent Care facility, please use the link below. It will take you to a list of all of our available [facility], including address, phone number and hours of operation. Please do not delay care.  ?[facility] ? ?If you or a family member do not have a primary care provider, use the link below to schedule a visit and establish  care. When you choose a [facility] primary care physician or advanced practice provider, you gain a long-term partner in health. ?Find a Primary Care Provider ? ?Learn more about [facility]'s in-office and virtual care options: ?[facility]  ?

## 2022-03-11 DIAGNOSIS — I10 Essential (primary) hypertension: Secondary | ICD-10-CM | POA: Diagnosis not present

## 2022-03-11 DIAGNOSIS — Z0000 Encounter for general adult medical examination without abnormal findings: Secondary | ICD-10-CM | POA: Diagnosis not present

## 2022-03-11 DIAGNOSIS — E7849 Other hyperlipidemia: Secondary | ICD-10-CM | POA: Diagnosis not present

## 2022-03-11 DIAGNOSIS — E1142 Type 2 diabetes mellitus with diabetic polyneuropathy: Secondary | ICD-10-CM | POA: Diagnosis not present

## 2022-03-11 DIAGNOSIS — N390 Urinary tract infection, site not specified: Secondary | ICD-10-CM | POA: Diagnosis not present

## 2022-03-11 DIAGNOSIS — Z1239 Encounter for other screening for malignant neoplasm of breast: Secondary | ICD-10-CM | POA: Diagnosis not present

## 2022-03-17 ENCOUNTER — Ambulatory Visit
Admission: RE | Admit: 2022-03-17 | Discharge: 2022-03-17 | Disposition: A | Discharge status: Home / Self Care | Payer: Medicaid Other | Source: Ambulatory Visit | Attending: Internal Medicine | Admitting: Internal Medicine

## 2022-03-17 DIAGNOSIS — E2839 Other primary ovarian failure: Secondary | ICD-10-CM

## 2022-03-19 ENCOUNTER — Other Ambulatory Visit: Payer: Self-pay | Admitting: Internal Medicine

## 2022-03-19 ENCOUNTER — Telehealth: Payer: Medicaid Other | Admitting: Physician Assistant

## 2022-03-19 DIAGNOSIS — M79641 Pain in right hand: Secondary | ICD-10-CM | POA: Diagnosis not present

## 2022-03-19 DIAGNOSIS — J019 Acute sinusitis, unspecified: Secondary | ICD-10-CM | POA: Diagnosis not present

## 2022-03-19 DIAGNOSIS — J302 Other seasonal allergic rhinitis: Secondary | ICD-10-CM | POA: Diagnosis not present

## 2022-03-19 DIAGNOSIS — E1142 Type 2 diabetes mellitus with diabetic polyneuropathy: Secondary | ICD-10-CM | POA: Diagnosis not present

## 2022-03-19 DIAGNOSIS — B9689 Other specified bacterial agents as the cause of diseases classified elsewhere: Secondary | ICD-10-CM | POA: Diagnosis not present

## 2022-03-19 DIAGNOSIS — E559 Vitamin D deficiency, unspecified: Secondary | ICD-10-CM | POA: Diagnosis not present

## 2022-03-19 DIAGNOSIS — N390 Urinary tract infection, site not specified: Secondary | ICD-10-CM | POA: Diagnosis not present

## 2022-03-19 DIAGNOSIS — E7849 Other hyperlipidemia: Secondary | ICD-10-CM | POA: Diagnosis not present

## 2022-03-19 DIAGNOSIS — I10 Essential (primary) hypertension: Secondary | ICD-10-CM | POA: Diagnosis not present

## 2022-03-19 DIAGNOSIS — K581 Irritable bowel syndrome with constipation: Secondary | ICD-10-CM | POA: Diagnosis not present

## 2022-03-19 DIAGNOSIS — Z0000 Encounter for general adult medical examination without abnormal findings: Secondary | ICD-10-CM | POA: Diagnosis not present

## 2022-03-19 MED ORDER — AMOXICILLIN-POT CLAVULANATE 875-125 MG PO TABS: 1.0000 | ORAL_TABLET | Freq: Two times a day (BID) | ORAL | 0 refills | Status: DC

## 2022-03-19 MED ORDER — MELOXICAM 15 MG PO TABS: 15.0000 mg | ORAL_TABLET | Freq: Every day | ORAL | 0 refills | Status: DC

## 2022-03-19 NOTE — Patient Instructions (Signed)
?[name], thank you for joining Mar Daring, PA-C for today's virtual visit.  While this provider is not your primary care provider (PCP), if your PCP is located in our provider database this encounter information will be shared with them immediately following your visit. ? ?Consent: ?(Patient) Ellen Avery provided verbal consent for this virtual visit at the beginning of the encounter. ? ?Current Medications: ? ?Current Outpatient Medications:  ?  amoxicillin-clavulanate (AUGMENTIN) 875-125 MG tablet, Take 1 tablet by mouth 2 (two) times daily., Disp: 14 tablet, Rfl: 0 ?  meloxicam (MOBIC) 15 MG tablet, Take 1 tablet (15 mg total) by mouth daily., Disp: 30 tablet, Rfl: 0 ?  ACCU-CHEK AVIVA PLUS test strip, Check blood sugar 3-4 times daily, Disp: , Rfl: 2 ?  acetaminophen (TYLENOL) 650 MG CR tablet, Take 1,300 mg by mouth daily., Disp: , Rfl:  ?  aspirin EC 81 MG tablet, Take 81 mg by mouth daily., Disp: , Rfl:  ?  Cholecalciferol (VITAMIN D3) 3000 units TABS, Take 3,000 Units by mouth daily as needed (vitamin d)., Disp: , Rfl:  ?  DULoxetine (CYMBALTA) 60 MG capsule, Take 1 capsule (60 mg total) by mouth daily., Disp: 14 capsule, Rfl: 0 ?  famotidine (PEPCID) 20 MG tablet, Take 20 mg by mouth every morning., Disp: , Rfl:  ?  fenofibrate (TRICOR) 48 MG tablet, Take 48 mg by mouth daily., Disp: , Rfl:  ?  fluticasone (FLONASE) 50 MCG/ACT nasal spray, Place 2 sprays into both nostrils daily. (Patient taking differently: Place 2 sprays into both nostrils daily as needed for allergies or rhinitis.), Disp: 16 g, Rfl: 0 ?  gabapentin (NEURONTIN) 100 MG capsule, Take 1-2 capsules (100-200 mg total) by mouth See admin instructions. Take 100 mg in the morning, 100 mg at lunch, and 200 mg in the evening (Patient not taking: Reported on 02/06/2022), Disp: 60 capsule, Rfl: 0 ?  gabapentin (NEURONTIN) 300 MG capsule, Take 300 mg by mouth 2 (two) times daily., Disp: , Rfl:  ?  hydrOXYzine (ATARAX/VISTARIL) 25 MG  tablet, Take 1 tablet (25 mg total) by mouth every 6 (six) hours as needed for anxiety., Disp: 20 tablet, Rfl: 0 ?  LANTUS SOLOSTAR 100 UNIT/ML Solostar Pen, Inject 75 Units into the skin at bedtime., Disp: , Rfl:  ?  loratadine (CLARITIN) 10 MG tablet, Take 10 mg by mouth daily as needed for allergies., Disp: , Rfl:  ?  losartan-hydrochlorothiazide (HYZAAR) 100-12.5 MG tablet, Take 1 tablet by mouth daily. , Disp: , Rfl:  ?  metFORMIN (GLUCOPHAGE) 1000 MG tablet, Take 1,000 mg by mouth 2 (two) times daily with a meal., Disp: , Rfl:  ?  oxyCODONE-acetaminophen (PERCOCET/ROXICET) 5-325 MG tablet, Take 1 tablet by mouth every 6 (six) hours as needed for severe pain., Disp: 15 tablet, Rfl: 0 ?  rosuvastatin (CRESTOR) 20 MG tablet, Take 20 mg by mouth at bedtime., Disp: , Rfl:  ?  SUMAtriptan (IMITREX) 25 MG tablet, Take 1 tablet (25 mg total) by mouth once for 1 dose. May repeat in 2 hours if headache persists or recurs., Disp: 10 tablet, Rfl: 0 ?  tiZANidine (ZANAFLEX) 4 MG tablet, Take 1 tablet (4 mg total) by mouth every 6 (six) hours as needed for muscle spasms., Disp: 30 tablet, Rfl: 0  ? ?Medications ordered in this encounter:  ?Meds ordered this encounter  ?Medications  ? amoxicillin-clavulanate (AUGMENTIN) 875-125 MG tablet  ?  Sig: Take 1 tablet by mouth 2 (two) times daily.  ?  Dispense:  14 tablet  ?  Refill:  0  ?  Order Specific Question:   Supervising Provider  ?  Answer:   Noemi Chapel [[id number]]  ? meloxicam (MOBIC) 15 MG tablet  ?  Sig: Take 1 tablet (15 mg total) by mouth daily.  ?  Dispense:  30 tablet  ?  Refill:  0  ?  Order Specific Question:   Supervising Provider  ?  Answer:   Noemi Chapel [[id number]]  ?  ? ?*If you need refills on other medications prior to your next appointment, please contact your pharmacy* ? ?Follow-Up: ?Call back or seek an in-person evaluation if the symptoms worsen or if the condition fails to improve as anticipated. ? ?Other Instructions ? ?[facility]  ? ?After  [facility] [phone]  ?[facility] [address] [place], [place] [address]  ?EVENINGS & WEEKENDS NO APPOINTMENT NECESSARY Mon-Fri 5:30PM - 9PM   ?Sat 9 AM - 2 PM Sun 10 AM - 2 PM  ? ? ?Hand Pain ?Many things can cause hand pain. Some common causes are: ?An injury. ?Repeating the same movement with your hand over and over (overuse). ?Osteoporosis. ?Arthritis. ?Lumps in the tendons or joints of the hand and wrist (ganglion cysts). ?Nerve compression syndromes (carpal tunnel syndrome). ?Inflammation of the tendons (tendinitis). ?Infection. ?Follow these instructions at home: ?Pay attention to any changes in your symptoms. Take these actions to help with your discomfort: ?Managing pain, stiffness, and swelling ? ?Take over-the-counter and prescription medicines only as told by your health care provider. ?Wear a hand splint or support as told by your health care provider. ?If directed, put ice on the affected area: ?Put ice in a plastic bag. ?Place a towel between your skin and the bag. ?Leave the ice on for 20 minutes, 2-3 times a day. ?Activity ?Take breaks from repetitive activity often. ?Avoid activities that make your pain worse. ?Minimize stress on your hands and wrists as much as possible. ?Do stretches or exercises as told by your health care provider. ?Do not do activities that make your pain worse. ?Contact a health care provider if: ?Your pain does not get better after a few days of self-care. ?Your pain gets worse. ?Your pain affects your ability to do your daily activities. ?Get help right away if: ?Your hand becomes warm, red, or swollen. ?Your hand is numb or tingling. ?Your hand is extremely swollen or deformed. ?Your hand or fingers turn white or blue. ?You cannot move your hand, wrist, or fingers. ?Summary ?Many things can cause hand pain. ?Contact your health care provider if your pain does not get better after a few days of self care. ?Minimize stress  on your hands and wrists as much as possible. ?Do not do activities that make your pain worse. ?This information is not intended to replace advice given to you by your health care provider. Make sure you discuss any questions you have with your health care provider. ?Document Revised: 03/14/2021 Document Reviewed: 03/14/2021 ?Elsevier Patient Education ? 2022 [place]. ? ? ? ?If you have been instructed to have an in-person evaluation today at a local Urgent Care facility, please use the link below. It will take you to a list of all of our available [facility], including address, phone number and hours of operation. Please do not delay care.  ?[facility] ? ?If you or a family member do not have a primary care provider, use the  link below to schedule a visit and establish care. When you choose a [facility] primary care physician or advanced practice provider, you gain a long-term partner in health. ?Find a Primary Care Provider ? ?Learn more about [facility]'s in-office and virtual care options: ?[facility] Now ?

## 2022-03-19 NOTE — Progress Notes (Signed)
?Virtual Visit Consent  ? ?Ellen Avery, you are scheduled for a virtual visit with a [facility] provider today.   ?  ?Just as with appointments in the office, your consent must be obtained to participate.  Your consent will be active for this visit and any virtual visit you may have with one of our providers in the next 365 days.   ?  ?If you have a MyChart account, a copy of this consent can be sent to you electronically.  All virtual visits are billed to your insurance company just like a traditional visit in the office.   ? ?As this is a virtual visit, video technology does not allow for your provider to perform a traditional examination.  This may limit your provider's ability to fully assess your condition.  If your provider identifies any concerns that need to be evaluated in person or the need to arrange testing (such as labs, EKG, etc.), we will make arrangements to do so.   ?  ?Although advances in technology are sophisticated, we cannot ensure that it will always work on either your end or our end.  If the connection with a video visit is poor, the visit may have to be switched to a telephone visit.  With either a video or telephone visit, we are not always able to ensure that we have a secure connection.    ? ?I need to obtain your verbal consent now.   Are you willing to proceed with your visit today?  ?  ?[name] has provided verbal consent on 03/19/2022 for a virtual visit (video or telephone). ?  ?Mar Daring, PA-C  ? ?Date: 03/19/2022 1:13 PM ? ? ?Virtual Visit via Video Note  ? ?Ellen Avery, connected with  JAKIRA MCFADDEN  ([id number], [birth year]) on 03/19/22 at  1:00 PM EDT by a video-enabled telemedicine application and verified that I am speaking with the correct person using two identifiers. ? ?Location: ?Patient: Virtual Visit Location Patient: Home ?Provider: Virtual Visit Location Provider: Home Office ?  ?I discussed the limitations of evaluation and management by  telemedicine and the availability of in person appointments. The patient expressed understanding and agreed to proceed.   ? ?History of Present Illness: ?Ellen Avery is a 56 y.o. who identifies as a female who was assigned female at birth, and is being seen today for possible sinus infection. ? ?HPI: Sinusitis ?This is a new problem. The current episode started 1 to 4 weeks ago. The problem has been gradually worsening since onset. There has been no fever. Associated symptoms include chills (does have anemia), congestion, coughing, ear pain (fullness), headaches, sinus pressure and a sore throat. Pertinent negatives include no hoarse voice. Past treatments include acetaminophen (zyrtec). The treatment provided no relief.   ? ?Hand pain: Right hand pain started a couple days ago. Pain is in mid palm. Feels like it is cramped. Does report weakness in the right hand with grip. Has pain and mild swelling in the dorsal hand near the carpometacarpal joint line. Denies redness or warmth. Limited wrist extension noted. Does have bilateral carpal tunnel and reports this feels completely different. Has tried tylenol and ibuprofen. Ibuprofen helped some.  ? ?Problems:  ?Patient Active Problem List  ? Diagnosis Date Noted  ? Chest pain in adult 07/20/2019  ? IDDM (insulin dependent diabetes mellitus) 07/08/2018  ? HTN (hypertension) 07/08/2018  ? HLD (hyperlipidemia) 07/08/2018  ? Altered mental status 03/02/2016  ? MDD (  major depressive disorder), recurrent episode, moderate ([facility]) 03/02/2016  ? Psychoses ([facility])   ?  ?Allergies:  ?Allergies  ?Allergen Reactions  ? Doxycycline Hives and Itching  ? Flagyl [Metronidazole] Hives and Itching  ?  Benadryl   ? ?Medications:  ?Current Outpatient Medications:  ?  amoxicillin-clavulanate (AUGMENTIN) 875-125 MG tablet, Take 1 tablet by mouth 2 (two) times daily., Disp: 14 tablet, Rfl: 0 ?  meloxicam (MOBIC) 15 MG tablet, Take 1 tablet (15 mg total) by mouth daily., Disp: 30 tablet, Rfl:  0 ?  ACCU-CHEK AVIVA PLUS test strip, Check blood sugar 3-4 times daily, Disp: , Rfl: 2 ?  acetaminophen (TYLENOL) 650 MG CR tablet, Take 1,300 mg by mouth daily., Disp: , Rfl:  ?  aspirin EC 81 MG tablet, Take 81 mg by mouth daily., Disp: , Rfl:  ?  Cholecalciferol (VITAMIN D3) 3000 units TABS, Take 3,000 Units by mouth daily as needed (vitamin d)., Disp: , Rfl:  ?  DULoxetine (CYMBALTA) 60 MG capsule, Take 1 capsule (60 mg total) by mouth daily., Disp: 14 capsule, Rfl: 0 ?  famotidine (PEPCID) 20 MG tablet, Take 20 mg by mouth every morning., Disp: , Rfl:  ?  fenofibrate (TRICOR) 48 MG tablet, Take 48 mg by mouth daily., Disp: , Rfl:  ?  fluticasone (FLONASE) 50 MCG/ACT nasal spray, Place 2 sprays into both nostrils daily. (Patient taking differently: Place 2 sprays into both nostrils daily as needed for allergies or rhinitis.), Disp: 16 g, Rfl: 0 ?  gabapentin (NEURONTIN) 100 MG capsule, Take 1-2 capsules (100-200 mg total) by mouth See admin instructions. Take 100 mg in the morning, 100 mg at lunch, and 200 mg in the evening (Patient not taking: Reported on 02/06/2022), Disp: 60 capsule, Rfl: 0 ?  gabapentin (NEURONTIN) 300 MG capsule, Take 300 mg by mouth 2 (two) times daily., Disp: , Rfl:  ?  hydrOXYzine (ATARAX/VISTARIL) 25 MG tablet, Take 1 tablet (25 mg total) by mouth every 6 (six) hours as needed for anxiety., Disp: 20 tablet, Rfl: 0 ?  LANTUS SOLOSTAR 100 UNIT/ML Solostar Pen, Inject 75 Units into the skin at bedtime., Disp: , Rfl:  ?  loratadine (CLARITIN) 10 MG tablet, Take 10 mg by mouth daily as needed for allergies., Disp: , Rfl:  ?  losartan-hydrochlorothiazide (HYZAAR) 100-12.5 MG tablet, Take 1 tablet by mouth daily. , Disp: , Rfl:  ?  metFORMIN (GLUCOPHAGE) 1000 MG tablet, Take 1,000 mg by mouth 2 (two) times daily with a meal., Disp: , Rfl:  ?  oxyCODONE-acetaminophen (PERCOCET/ROXICET) 5-325 MG tablet, Take 1 tablet by mouth every 6 (six) hours as needed for severe pain., Disp: 15 tablet, Rfl:  0 ?  rosuvastatin (CRESTOR) 20 MG tablet, Take 20 mg by mouth at bedtime., Disp: , Rfl:  ?  SUMAtriptan (IMITREX) 25 MG tablet, Take 1 tablet (25 mg total) by mouth once for 1 dose. May repeat in 2 hours if headache persists or recurs., Disp: 10 tablet, Rfl: 0 ?  tiZANidine (ZANAFLEX) 4 MG tablet, Take 1 tablet (4 mg total) by mouth every 6 (six) hours as needed for muscle spasms., Disp: 30 tablet, Rfl: 0 ? ?Observations/Objective: ?Patient is well-developed, well-nourished in no acute distress.  ?Resting comfortably at home.  ?Head is normocephalic, atraumatic.  ?No labored breathing.  ?Speech is clear and coherent with logical content.  ?Patient is alert and oriented at baseline.  ? ? ?Assessment and Plan: ?1. Acute bacterial sinusitis ?- amoxicillin-clavulanate (AUGMENTIN) 875-125 MG tablet; Take 1 tablet  by mouth 2 (two) times daily.  Dispense: 14 tablet; Refill: 0 ? ?2. Right hand pain ?- meloxicam (MOBIC) 15 MG tablet; Take 1 tablet (15 mg total) by mouth daily.  Dispense: 30 tablet; Refill: 0 ? ?- Augmentin for sinus infection ?- Will try Meloxicam for hand pain; DDx: arthritis, tendinitis, gout, early Dupuytren's Contracture ?- Cock up wrist splint to brace hand and limit movement x 10-14 days ?- Ice as needed ?- Discussed [facility]- already a patient within their office) if not improving ? ? ?Follow Up Instructions: ?I discussed the assessment and treatment plan with the patient. The patient was provided an opportunity to ask questions and all were answered. The patient agreed with the plan and demonstrated an understanding of the instructions.  A copy of instructions were sent to the patient via MyChart unless otherwise noted below.  ? ? ?The patient was advised to call back or seek an in-person evaluation if the symptoms worsen or if the condition fails to improve as anticipated. ? ?Time:  ?I spent 15 minutes with the patient via telehealth technology discussing the above  problems/concerns.   ? ?Mar Daring, PA-C ?

## 2022-03-20 LAB — LIPID PANEL
Cholesterol: 149 mg/dL (ref <200)
HDL: 58 mg/dL (ref >=50)
LDL Cholesterol (Calc): 70 mg/dL (calc)
Non-HDL Cholesterol (Calc): 91 mg/dL (calc) (ref <130)
Total CHOL/HDL Ratio: 2.6 (calc) (ref <5.0)
Triglycerides: 128 mg/dL (ref <150)

## 2022-03-20 LAB — VITAMIN D 25 HYDROXY (VIT D DEFICIENCY, FRACTURES): Vit D, 25-Hydroxy: 26 ng/mL — ABNORMAL LOW (ref 30–100)

## 2022-03-20 LAB — TSH: TSH: 0.57 mIU/L

## 2022-03-20 LAB — URINE CULTURE
MICRO NUMBER:: 13254232
SPECIMEN QUALITY:: ADEQUATE

## 2022-03-20 LAB — VITAMIN B12: Vitamin B-12: 233 pg/mL (ref 200–1100)

## 2022-03-20 LAB — FOLATE: Folate: 11.3 ng/mL

## 2022-03-20 LAB — EXTRA LAV TOP TUBE

## 2022-03-28 ENCOUNTER — Emergency Department (HOSPITAL_COMMUNITY)
Admission: EM | Admit: 2022-03-28 | Discharge: 2022-03-28 | Disposition: A | Discharge status: Home / Self Care | Payer: Medicaid Other | Attending: Emergency Medicine | Admitting: Emergency Medicine

## 2022-03-28 ENCOUNTER — Other Ambulatory Visit: Payer: Self-pay

## 2022-03-28 ENCOUNTER — Encounter (HOSPITAL_COMMUNITY): Payer: Self-pay

## 2022-03-28 DIAGNOSIS — E1165 Type 2 diabetes mellitus with hyperglycemia: Secondary | ICD-10-CM | POA: Diagnosis not present

## 2022-03-28 DIAGNOSIS — R519 Headache, unspecified: Secondary | ICD-10-CM | POA: Diagnosis present

## 2022-03-28 DIAGNOSIS — R0981 Nasal congestion: Secondary | ICD-10-CM | POA: Diagnosis not present

## 2022-03-28 DIAGNOSIS — I10 Essential (primary) hypertension: Secondary | ICD-10-CM | POA: Insufficient documentation

## 2022-03-28 DIAGNOSIS — R509 Fever, unspecified: Secondary | ICD-10-CM | POA: Diagnosis not present

## 2022-03-28 DIAGNOSIS — Z7984 Long term (current) use of oral hypoglycemic drugs: Secondary | ICD-10-CM | POA: Insufficient documentation

## 2022-03-28 DIAGNOSIS — Z20822 Contact with and (suspected) exposure to covid-19: Secondary | ICD-10-CM | POA: Diagnosis not present

## 2022-03-28 DIAGNOSIS — Z794 Long term (current) use of insulin: Secondary | ICD-10-CM | POA: Insufficient documentation

## 2022-03-28 DIAGNOSIS — G4489 Other headache syndrome: Secondary | ICD-10-CM | POA: Diagnosis not present

## 2022-03-28 DIAGNOSIS — J029 Acute pharyngitis, unspecified: Secondary | ICD-10-CM | POA: Insufficient documentation

## 2022-03-28 DIAGNOSIS — Z79899 Other long term (current) drug therapy: Secondary | ICD-10-CM | POA: Insufficient documentation

## 2022-03-28 DIAGNOSIS — Z7982 Long term (current) use of aspirin: Secondary | ICD-10-CM | POA: Diagnosis not present

## 2022-03-28 DIAGNOSIS — R059 Cough, unspecified: Secondary | ICD-10-CM | POA: Diagnosis not present

## 2022-03-28 LAB — BASIC METABOLIC PANEL
Anion gap: 9 (ref 5–15)
BUN: 8 mg/dL (ref 6–20)
CO2: 22 mmol/L (ref 22–32)
Calcium: 9.6 mg/dL (ref 8.9–10.3)
Chloride: 107 mmol/L (ref 98–111)
Creatinine, Ser: 0.83 mg/dL (ref 0.44–1.00)
GFR, Estimated: >60 mL/min (ref >60)
Glucose, Bld: 204 mg/dL — ABNORMAL HIGH (ref 70–99)
Potassium: 4.2 mmol/L (ref 3.5–5.1)
Sodium: 138 mmol/L (ref 135–145)

## 2022-03-28 LAB — CBC
HCT: 39.5 % (ref 36.0–46.0)
Hemoglobin: 12.6 g/dL (ref 12.0–15.0)
MCH: 27 pg (ref 26.0–34.0)
MCHC: 31.9 g/dL (ref 30.0–36.0)
MCV: 84.8 fL (ref 80.0–100.0)
Platelets: 344 10*3/uL (ref 150–400)
RBC: 4.66 MIL/uL (ref 3.87–5.11)
RDW: 12.4 % (ref 11.5–15.5)
WBC: 9.2 10*3/uL (ref 4.0–10.5)
nRBC: 0 % (ref 0.0–0.2)

## 2022-03-28 LAB — WET PREP, GENITAL
Clue Cells Wet Prep HPF POC: NONE SEEN
Sperm: NONE SEEN
Trich, Wet Prep: NONE SEEN
WBC, Wet Prep HPF POC: <10 (ref <10)
Yeast Wet Prep HPF POC: NONE SEEN

## 2022-03-28 LAB — RESP PANEL BY RT-PCR (FLU A&B, COVID) ARPGX2
Influenza A by PCR: NEGATIVE
Influenza B by PCR: NEGATIVE
SARS Coronavirus 2 by RT PCR: NEGATIVE

## 2022-03-28 MED ORDER — FLUCONAZOLE 150 MG PO TABS: 150.0000 mg | ORAL_TABLET | Freq: Every day | ORAL | 0 refills | Status: DC

## 2022-03-28 NOTE — ED Triage Notes (Signed)
Pt arrived via [facility] from home for a HA,, fatigue and [id number]. Pt denies N/V/D.   ?

## 2022-03-28 NOTE — ED Provider Notes (Signed)
?[facility] ?Provider Note ? ? ?CSN: [id number] ?Arrival date & time: 03/28/22  1321 ? ?  ? ?History ? ?Chief Complaint  ?Patient presents with  ? Headache  ? ? ?Ellen Avery is a 56 y.o. female with medical history of fibromyalgia, GERD, chronic headaches, hypertension, diabetes.  Presents ED for evaluation of headache, fatigue and cough x1 week.  Patient was seen at her PCP on 4/12 and diagnosed with sinus infection.  Patient was placed on Augmentin at this time.  Patient states that she took Augmentin for 2 days however she stopped due to having vaginal discharge.  The patient states that she also has associated body aches and chills, sore throat, cough, sinus pressure.  The patient denies any fevers, nausea, vomiting, diarrhea.  Patient denies any shortness of breath or chest pain. ? ? ?Headache ?Associated symptoms: sinus pressure and sore throat   ?Associated symptoms: no diarrhea, no fever, no nausea and no vomiting   ? ?  ? ?Home Medications ?Prior to Admission medications   ?Medication Sig Start Date End Date Taking? Authorizing Provider  ?fluconazole (DIFLUCAN) 150 MG tablet Take 1 tablet (150 mg total) by mouth daily. 03/28/22  Yes Azucena Cecil, PA-C  ?ACCU-CHEK AVIVA PLUS test strip Check blood sugar 3-4 times daily 05/25/18   [provider]  ?acetaminophen (TYLENOL) 650 MG CR tablet Take 1,300 mg by mouth daily.    [provider]  ?amoxicillin-clavulanate (AUGMENTIN) 875-125 MG tablet Take 1 tablet by mouth 2 (two) times daily. 03/19/22   Mar Daring, PA-C  ?aspirin EC 81 MG tablet Take 81 mg by mouth daily.    [provider]  ?Cholecalciferol (VITAMIN D3) 3000 units TABS Take 3,000 Units by mouth daily as needed (vitamin d).    [provider]  ?DULoxetine (CYMBALTA) 60 MG capsule Take 1 capsule (60 mg total) by mouth daily. 04/03/20   Sharion Balloon, NP  ?famotidine (PEPCID) 20 MG tablet Take 20 mg by mouth  every morning. 12/27/21   [provider]  ?fenofibrate (TRICOR) 48 MG tablet Take 48 mg by mouth daily. 12/27/21   [provider]  ?fluticasone (FLONASE) 50 MCG/ACT nasal spray Place 2 sprays into both nostrils daily. ?Patient taking differently: Place 2 sprays into both nostrils daily as needed for allergies or rhinitis. 04/06/21   Brunetta Jeans, PA-C  ?gabapentin (NEURONTIN) 100 MG capsule Take 1-2 capsules (100-200 mg total) by mouth See admin instructions. Take 100 mg in the morning, 100 mg at lunch, and 200 mg in the evening ?Patient not taking: Reported on 02/06/2022 04/03/20   Sharion Balloon, NP  ?gabapentin (NEURONTIN) 300 MG capsule Take 300 mg by mouth 2 (two) times daily. 12/27/21   [provider]  ?hydrOXYzine (ATARAX/VISTARIL) 25 MG tablet Take 1 tablet (25 mg total) by mouth every 6 (six) hours as needed for anxiety. 09/13/18   [name], [name], MD  ?LANTUS SOLOSTAR 100 UNIT/ML Solostar Pen Inject 75 Units into the skin at bedtime. 12/12/19   [provider]  ?loratadine (CLARITIN) 10 MG tablet Take 10 mg by mouth daily as needed for allergies.    [provider]  ?losartan-hydrochlorothiazide (HYZAAR) 100-12.5 MG tablet Take 1 tablet by mouth daily.  11/01/18   [provider]  ?meloxicam (MOBIC) 15 MG tablet Take 1 tablet (15 mg total) by mouth daily. 03/19/22   Mar Daring, PA-C  ?metFORMIN (GLUCOPHAGE) 1000 MG tablet Take 1,000 mg by  mouth 2 (two) times daily with a meal.    [provider]  ?oxyCODONE-acetaminophen (PERCOCET/ROXICET) 5-325 MG tablet Take 1 tablet by mouth every 6 (six) hours as needed for severe pain. 02/07/22   Tegeler, Gwenyth Allegra, MD  ?rosuvastatin (CRESTOR) 20 MG tablet Take 20 mg by mouth at bedtime. 12/27/21   [provider]  ?SUMAtriptan (IMITREX) 25 MG tablet Take 1 tablet (25 mg total) by mouth once for 1 dose. May repeat in 2 hours if headache persists or recurs. 03/04/22 03/04/22  Brunetta Jeans, PA-C  ?tiZANidine (ZANAFLEX) 4 MG tablet Take 1 tablet (4 mg total) by mouth every 6 (six) hours as needed for muscle spasms. 01/06/21   Jacquelin Hawking, NP  ?   ? ?Allergies    ?Doxycycline and Flagyl [metronidazole]   ? ?Review of Systems   ?Review of Systems  ?Constitutional:  Positive for chills. Negative for fever.  ?HENT:  Positive for sinus pressure and sore throat.   ?Respiratory:  Negative for shortness of breath.   ?Cardiovascular:  Negative for chest pain.  ?Gastrointestinal:  Negative for diarrhea, nausea and vomiting.  ?Neurological:  Positive for headaches.  ?All other systems reviewed and are negative. ? ?Physical Exam ?Updated Vital Signs ?BP 116/79 (BP Location: Right Arm)   Pulse 87   Temp 99.6 ?F (37.6 ?C) (Oral)   Resp 16   Ht '5\' 6"'$  (1.676 m)   Wt 99.8 kg   SpO2 98%   BMI 35.51 kg/m?  ?Physical Exam ?Vitals and nursing note reviewed.  ?Constitutional:   ?   General: She is not in acute distress. ?   Appearance: She is well-developed. She is not ill-appearing, toxic-appearing or diaphoretic.  ?HENT:  ?   Head: Normocephalic and atraumatic.  ?   Nose: Nose normal.  ?   Mouth/Throat:  ?   Mouth: Mucous membranes are moist.  ?   Pharynx: Oropharynx is clear.  ?Eyes:  ?   Extraocular Movements: Extraocular movements intact.  ?   Conjunctiva/sclera: Conjunctivae normal.  ?   Pupils: Pupils are equal, round, and reactive to light.  ?Cardiovascular:  ?   Rate and Rhythm: Normal rate and regular rhythm.  ?Pulmonary:  ?   Effort: Pulmonary effort is normal.  ?   Breath sounds: Normal breath sounds. No wheezing.  ?Abdominal:  ?   General: Abdomen is flat. Bowel sounds are normal.  ?   Palpations: Abdomen is soft.  ?   Tenderness: There is no abdominal tenderness.  ?Musculoskeletal:  ?   Cervical back: Normal range of motion and neck supple.  ?Skin: ?   General: Skin is warm and dry.  ?   Capillary Refill: Capillary refill takes less than 2 seconds.  ?Neurological:  ?   Mental Status: She  is alert and oriented to person, place, and time.  ?   GCS: GCS eye subscore is 4. GCS verbal subscore is 5. GCS motor subscore is 6.  ?   Cranial Nerves: Cranial nerves 2-12 are intact. No cranial nerve deficit.  ?   Sensory: Sensation is intact. No sensory deficit.  ?   Motor: Motor function is intact. No weakness.  ?   Coordination: Heel to Shin Test normal.  ? ? ?ED Results / Procedures / Treatments   ?Labs ?(all labs ordered are listed, but only abnormal results are displayed) ?Labs Reviewed  ?BASIC METABOLIC PANEL - Abnormal; Notable for the following components:  ?    Result Value  ?  Glucose, Bld 204 (*)   ? All other components within normal limits  ?WET PREP, GENITAL  ?RESP PANEL BY RT-PCR (FLU A&B, COVID) [id number]  ?CBC  ? ? ?EKG ?None ? ?Radiology ?No results found. ? ?Procedures ?Procedures  ? ? ?Medications Ordered in ED ?Medications - No data to display ? ?ED Course/ Medical Decision Making/ A&P ?  ?                        ?Medical Decision Making ?Amount and/or Complexity of Data Reviewed ?Labs: ordered. ? ? ?56 year old female presents ED for evaluation of URI symptoms.  Please see HPI for further details. ? ?On examination, the patient is afebrile, nontachycardic, not hypoxic.  The patient's lung sounds are clear bilaterally.  Patient abdomen is soft compressible all 4 quadrants.  Patient neurological examination shows no focal neurodeficits.  Patient denies this being "the worst headache of her life". ? ?Patient worked up with the following labs imaging studies interpreted by me personally: ?- BMP shows elevated glucose to 204, noncontributory to symptoms ?- CBC unremarkable ?- Wet prep shows no signs of yeast cells.  The patient will be given one-time dose of fluconazole to take at the end of her antibiotic course ?- Respiratory panel is pending ? ?Patient encouraged to continue taking Augmentin.  Patient will be given one-time dose of fluconazole to take at the end of her antibiotics.  The patient  was given return precautions and she voiced understanding with these.  The patient had all of her questions answered to her satisfaction.  The patient is stable for discharge home at this time. ? ? ?Final Clinical Imp

## 2022-03-28 NOTE — Discharge Instructions (Addendum)
Please return to the ED with any new symptoms such as shortness breath or chest pain ?Please finish your entire course of Augmentin. ?Once your Augmentin has been completed, please take your one-time dose of fluconazole to combat any yeast infection ?Please follow-up with your PCP for any ongoing needs ?You may also take a decongestant ?

## 2022-04-18 DIAGNOSIS — J302 Other seasonal allergic rhinitis: Secondary | ICD-10-CM | POA: Diagnosis not present

## 2022-04-18 DIAGNOSIS — I10 Essential (primary) hypertension: Secondary | ICD-10-CM | POA: Diagnosis not present

## 2022-04-18 DIAGNOSIS — E559 Vitamin D deficiency, unspecified: Secondary | ICD-10-CM | POA: Diagnosis not present

## 2022-04-18 DIAGNOSIS — E1142 Type 2 diabetes mellitus with diabetic polyneuropathy: Secondary | ICD-10-CM | POA: Diagnosis not present

## 2022-05-09 ENCOUNTER — Ambulatory Visit (HOSPITAL_COMMUNITY)
Admission: EM | Admit: 2022-05-09 | Discharge: 2022-05-09 | Disposition: A | Discharge status: Home / Self Care | Payer: Medicaid Other | Attending: Physician Assistant | Admitting: Physician Assistant

## 2022-05-09 ENCOUNTER — Encounter (HOSPITAL_COMMUNITY): Payer: Self-pay | Admitting: Emergency Medicine

## 2022-05-09 DIAGNOSIS — R5381 Other malaise: Secondary | ICD-10-CM | POA: Insufficient documentation

## 2022-05-09 DIAGNOSIS — R42 Dizziness and giddiness: Secondary | ICD-10-CM | POA: Diagnosis not present

## 2022-05-09 DIAGNOSIS — R5383 Other fatigue: Secondary | ICD-10-CM | POA: Insufficient documentation

## 2022-05-09 DIAGNOSIS — M546 Pain in thoracic spine: Secondary | ICD-10-CM | POA: Diagnosis not present

## 2022-05-09 LAB — CBC WITH DIFFERENTIAL/PLATELET
Abs Immature Granulocytes: 0.02 10*3/uL (ref 0.00–0.07)
Basophils Absolute: 0.1 10*3/uL (ref 0.0–0.1)
Basophils Relative: 1 %
Eosinophils Absolute: 0.1 10*3/uL (ref 0.0–0.5)
Eosinophils Relative: 1 %
HCT: 38.3 % (ref 36.0–46.0)
Hemoglobin: 12.4 g/dL (ref 12.0–15.0)
Immature Granulocytes: 0 %
Lymphocytes Relative: 40 %
Lymphs Abs: 4 10*3/uL (ref 0.7–4.0)
MCH: 27.6 pg (ref 26.0–34.0)
MCHC: 32.4 g/dL (ref 30.0–36.0)
MCV: 85.1 fL (ref 80.0–100.0)
Monocytes Absolute: 0.5 10*3/uL (ref 0.1–1.0)
Monocytes Relative: 5 %
Neutro Abs: 5.2 10*3/uL (ref 1.7–7.7)
Neutrophils Relative %: 53 %
Platelets: 380 10*3/uL (ref 150–400)
RBC: 4.5 MIL/uL (ref 3.87–5.11)
RDW: 12.7 % (ref 11.5–15.5)
WBC: 9.9 10*3/uL (ref 4.0–10.5)
nRBC: 0 % (ref 0.0–0.2)

## 2022-05-09 LAB — POCT URINALYSIS DIPSTICK, ED / UC
Bilirubin Urine: NEGATIVE
Glucose, UA: NEGATIVE mg/dL
Hgb urine dipstick: NEGATIVE
Ketones, ur: NEGATIVE mg/dL
Leukocytes,Ua: NEGATIVE
Nitrite: NEGATIVE
Protein, ur: NEGATIVE mg/dL
Specific Gravity, Urine: 1.025 (ref 1.005–1.030)
Urobilinogen, UA: 0.2 mg/dL (ref 0.0–1.0)
pH: 5 (ref 5.0–8.0)

## 2022-05-09 LAB — COMPREHENSIVE METABOLIC PANEL
ALT: 15 U/L (ref 0–44)
AST: 15 U/L (ref 15–41)
Albumin: 4 g/dL (ref 3.5–5.0)
Alkaline Phosphatase: 76 U/L (ref 38–126)
Anion gap: 11 (ref 5–15)
BUN: 9 mg/dL (ref 6–20)
CO2: 25 mmol/L (ref 22–32)
Calcium: 9.8 mg/dL (ref 8.9–10.3)
Chloride: 103 mmol/L (ref 98–111)
Creatinine, Ser: 0.74 mg/dL (ref 0.44–1.00)
GFR, Estimated: >60 mL/min (ref >60)
Glucose, Bld: 119 mg/dL — ABNORMAL HIGH (ref 70–99)
Potassium: 4 mmol/L (ref 3.5–5.1)
Sodium: 139 mmol/L (ref 135–145)
Total Bilirubin: 0.2 mg/dL — ABNORMAL LOW (ref 0.3–1.2)
Total Protein: 7.1 g/dL (ref 6.5–8.1)

## 2022-05-09 LAB — CBG MONITORING, ED: Glucose-Capillary: 131 mg/dL — ABNORMAL HIGH (ref 70–99)

## 2022-05-09 NOTE — ED Triage Notes (Signed)
Pt is present today with c/o HA, dizziness, and lower right back pian. Pt sx started yesterday

## 2022-05-09 NOTE — Discharge Instructions (Signed)
Your vital signs were normal.  Your EKG was normal.  Your blood sugar is appropriate.  I will contact you if your lab work is abnormal and this changes are planned.  Please make sure you are drinking lots of fluid and resting.  Eat small frequent meals.  It is possible that your recent change in blood sugar is contributing to your symptoms and if that is the case symptoms should improve within a few days.  I do recommend that you follow-up with cardiology; call to schedule an appointment.  If you have any worsening symptoms including passing out, severe headache, worsening lightheadedness, chest pain, shortness of breath, nausea, vomiting you need to go to the emergency room immediately.

## 2022-05-09 NOTE — ED Provider Notes (Signed)
[facility]    CSN: [id number] Arrival date & time: 05/09/22  1159      History   Chief Complaint Chief Complaint  Patient presents with   Back Pain   Dizziness    HPI Ellen Avery is a 56 y.o. female.   Patient presents today with several concerns.  She reports for the past several days she is experienced near syncope/episodic lightheadedness.  She describes this as a feeling that she is going to pass out but denies any syncopal episodes.  She denies any chest pain, shortness of breath, vision change.  She does report occasional headache and earlier today developed right flank pain.  She does report some urinary frequency but denies any hematuria, dysuria, abdominal pain, nausea, vomiting.  She does have a history of pyelonephritis and was treated several months ago.  Denies additional antibiotics since that time.  She does have a history of diabetes and reports that her blood sugars have significantly improved; she was traditionally between 300-400 but has recently added GLP-1 agonist with blood sugars around 100.  Denies additional medication changes.  Denies any recent illness or head injury.  She does report some situational stressors as there have been a death in the family.  She is eating and drinking normally.   Past Medical History:  Diagnosis Date   Anxiety    Arthritis    Chronic headaches    DDD (degenerative disc disease), cervical    Fibromyalgia    GERD (gastroesophageal reflux disease)    History of colon polyps    History of psychosis    Hypercholesteremia    Hypertension    IBS (irritable bowel syndrome)    MDD (major depressive disorder)    Right sided facial pain    Seasonal allergic rhinitis    Sinus congestion    12-24-2018 per pt no fever   Type 2 diabetes mellitus treated with insulin ([place])    Vitamin D deficiency    Wears dentures    full upper,  partial lower   Wears glasses     Patient Active Problem List   Diagnosis Date Noted    Chest pain in adult 07/20/2019   IDDM (insulin dependent diabetes mellitus) 07/08/2018   HTN (hypertension) 07/08/2018   HLD (hyperlipidemia) 07/08/2018   Altered mental status 03/02/2016   MDD (major depressive disorder), recurrent episode, moderate ([place]) 03/02/2016   Psychoses ([place])     Past Surgical History:  Procedure Laterality Date   ARTERY BIOPSY Right 12/28/2018   Procedure: RIGHT BIOPSY TEMPORAL ARTERY;  Surgeon: Kieth Brightly, Arta Bruce, MD;  Location: [facility];  Service: General;  Laterality: Right;   CARDIAC CATHETERIZATION     MULTIPLE TOOTH EXTRACTIONS  04/ 2019    sedation   TOTAL ABDOMINAL HYSTERECTOMY W/ BILATERAL SALPINGOOPHORECTOMY  2009    OB History   No obstetric history on file.      Home Medications    Prior to Admission medications   Medication Sig Start Date End Date Taking? Authorizing Provider  ACCU-CHEK AVIVA PLUS test strip Check blood sugar 3-4 times daily 05/25/18   [provider]  acetaminophen (TYLENOL) 650 MG CR tablet Take 1,300 mg by mouth daily.    [provider]  amoxicillin-clavulanate (AUGMENTIN) 875-125 MG tablet Take 1 tablet by mouth 2 (two) times daily. 03/19/22   Mar Daring, PA-C  aspirin EC 81 MG tablet Take 81 mg by mouth daily.    [provider]  Cholecalciferol (  VITAMIN D3) 3000 units TABS Take 3,000 Units by mouth daily as needed (vitamin d).    [provider]  DULoxetine (CYMBALTA) 60 MG capsule Take 1 capsule (60 mg total) by mouth daily. 04/03/20   Sharion Balloon, NP  famotidine (PEPCID) 20 MG tablet Take 20 mg by mouth every morning. 12/27/21   [provider]  fenofibrate (TRICOR) 48 MG tablet Take 48 mg by mouth daily. 12/27/21   [provider]  fluconazole (DIFLUCAN) 150 MG tablet Take 1 tablet (150 mg total) by mouth daily. 03/28/22   Azucena Cecil, PA-C  fluticasone (FLONASE) 50 MCG/ACT nasal spray Place 2 sprays into both nostrils  daily. Patient taking differently: Place 2 sprays into both nostrils daily as needed for allergies or rhinitis. 04/06/21   Brunetta Jeans, PA-C  gabapentin (NEURONTIN) 100 MG capsule Take 1-2 capsules (100-200 mg total) by mouth See admin instructions. Take 100 mg in the morning, 100 mg at lunch, and 200 mg in the evening Patient not taking: Reported on 02/06/2022 04/03/20   Sharion Balloon, NP  gabapentin (NEURONTIN) 300 MG capsule Take 300 mg by mouth 2 (two) times daily. 12/27/21   [provider]  hydrOXYzine (ATARAX/VISTARIL) 25 MG tablet Take 1 tablet (25 mg total) by mouth every 6 (six) hours as needed for anxiety. 09/13/18   [name], [name], MD  LANTUS SOLOSTAR 100 UNIT/ML Solostar Pen Inject 75 Units into the skin at bedtime. 12/12/19   [provider]  loratadine (CLARITIN) 10 MG tablet Take 10 mg by mouth daily as needed for allergies.    [provider]  losartan-hydrochlorothiazide (HYZAAR) 100-12.5 MG tablet Take 1 tablet by mouth daily.  11/01/18   [provider]  meloxicam (MOBIC) 15 MG tablet Take 1 tablet (15 mg total) by mouth daily. 03/19/22   Mar Daring, PA-C  metFORMIN (GLUCOPHAGE) 1000 MG tablet Take 1,000 mg by mouth 2 (two) times daily with a meal.    [provider]  oxyCODONE-acetaminophen (PERCOCET/ROXICET) 5-325 MG tablet Take 1 tablet by mouth every 6 (six) hours as needed for severe pain. 02/07/22   Tegeler, Gwenyth Allegra, MD  rosuvastatin (CRESTOR) 20 MG tablet Take 20 mg by mouth at bedtime. 12/27/21   [provider]  SUMAtriptan (IMITREX) 25 MG tablet Take 1 tablet (25 mg total) by mouth once for 1 dose. May repeat in 2 hours if headache persists or recurs. 03/04/22 03/04/22  Brunetta Jeans, PA-C  tiZANidine (ZANAFLEX) 4 MG tablet Take 1 tablet (4 mg total) by mouth every 6 (six) hours as needed for muscle spasms. 01/06/21   Jacquelin Hawking, NP    Family History Family History  Problem Relation Age of  Onset   Diabetes Mother    Heart disease Paternal Grandmother    Breast cancer Paternal Grandmother    Diabetes Paternal Uncle    Heart Problems Brother    Breast cancer Maternal Aunt    Head & neck cancer Cousin    Cervical cancer Paternal Aunt    Colon cancer Neg Hx    Colon polyps Neg Hx    Esophageal cancer Neg Hx    Rectal cancer Neg Hx    Stomach cancer Neg Hx     Social History Social History   Tobacco Use   Smoking status: Every Day    Packs/day: 1.00    Years: 35.00    Pack years: 35.00    Types: Cigarettes   Smokeless tobacco: Never  Vaping Use   Vaping Use: Never used  Substance Use Topics   Alcohol use: No   Drug use: No     Allergies   Doxycycline and Flagyl [metronidazole]   Review of Systems Review of Systems  Constitutional:  Positive for activity change. Negative for appetite change, fatigue and fever.  Respiratory:  Negative for cough and shortness of breath.   Cardiovascular:  Negative for chest pain.  Gastrointestinal:  Negative for abdominal pain, diarrhea, nausea and vomiting.  Neurological:  Positive for light-headedness and headaches. Negative for dizziness, seizures, syncope, speech difficulty and weakness.    Physical Exam Triage Vital Signs ED Triage Vitals [05/09/22 1223]  Enc Vitals Group     BP 111/74     Pulse Rate 73     Resp 18     Temp 98.3 F (36.8 C)     Temp src      SpO2 98 %     Weight      Height      Head Circumference      Peak Flow      Pain Score 8     Pain Loc      Pain Edu?      Excl. in [place]?    Orthostatic VS for the past 24 hrs:  BP- Lying Pulse- Lying BP- Sitting Pulse- Sitting BP- Standing at 0 minutes Pulse- Standing at 0 minutes  05/09/22 1304 110/74 78 124/83 76 120/87 83    Updated Vital Signs BP 111/74   Pulse 73   Temp 98.3 F (36.8 C)   Resp 18   SpO2 98%   Visual Acuity Right Eye Distance:   Left Eye Distance:   Bilateral Distance:    Right Eye Near:   Left Eye Near:     Bilateral Near:     Physical Exam Vitals reviewed.  Constitutional:      General: She is awake. She is not in acute distress.    Appearance: Normal appearance. She is well-developed. She is not ill-appearing.     Comments: Very pleasant female appears stated age in no acute distress sitting comfortably in exam room  HENT:     Head: Normocephalic and atraumatic. No raccoon eyes, Battle's sign or contusion.     Right Ear: Tympanic membrane, ear canal and external ear normal. No hemotympanum.     Left Ear: Tympanic membrane, ear canal and external ear normal. No hemotympanum.     Mouth/Throat:     Tongue: Tongue does not deviate from midline.     Pharynx: Uvula midline. No oropharyngeal exudate or posterior oropharyngeal erythema.  Eyes:     Extraocular Movements: Extraocular movements intact.     Conjunctiva/sclera: Conjunctivae normal.     Pupils: Pupils are equal, round, and reactive to light.  Cardiovascular:     Rate and Rhythm: Normal rate and regular rhythm.     Heart sounds: Normal heart sounds, S1 normal and S2 normal. No murmur heard. Pulmonary:     Effort: Pulmonary effort is normal.     Breath sounds: Normal breath sounds. No wheezing, rhonchi or rales.     Comments: Clear to auscultation bilaterally Abdominal:     General: Bowel sounds are normal.     Palpations: Abdomen is soft.     Tenderness: There is no abdominal tenderness. There is no right CVA tenderness, left CVA tenderness, guarding or rebound.     Comments: Benign abdominal exam  Musculoskeletal:     Cervical back:  No tenderness or bony tenderness. No spinous process tenderness or muscular tenderness.     Thoracic back: Tenderness present. No bony tenderness.     Lumbar back: No tenderness or bony tenderness.       Back:     Comments: Tender to palpation of right thoracic paraspinal muscles.  No deformity or step-off noted.  No pain percussion of vertebrae.  Strength 5/5 bilateral upper and lower  extremities  Lymphadenopathy:     Head:     Right side of head: No submental, submandibular or tonsillar adenopathy.     Left side of head: No submental, submandibular or tonsillar adenopathy.  Neurological:     General: No focal deficit present.     Mental Status: She is alert and oriented to person, place, and time.     Cranial Nerves: Cranial nerves 2-12 are intact.     Motor: Motor function is intact.     Coordination: Coordination is intact.     Gait: Gait is intact.  Psychiatric:        Behavior: Behavior is cooperative.     UC Treatments / Results  Labs (all labs ordered are listed, but only abnormal results are displayed) Labs Reviewed  CBG MONITORING, ED - Abnormal; Notable for the following components:      Result Value   Glucose-Capillary 131 (*)    All other components within normal limits  CBC WITH DIFFERENTIAL/PLATELET  COMPREHENSIVE METABOLIC PANEL  POCT URINALYSIS DIPSTICK, ED / UC    EKG   Radiology No results found.  Procedures Procedures (including critical care time)  Medications Ordered in UC Medications - No data to display  Initial Impression / Assessment and Plan / UC Course  I have reviewed the triage vital signs and the nursing notes.  Pertinent labs & imaging results that were available during my care of the patient were reviewed by me and considered in my medical decision making (see chart for details).     Vital signs and physical exam reassuring today; no indication for emergent evaluation or imaging.  EKG obtained showed normal sinus rhythm with ventricular rate of 70 bpm without ischemic changes; no significant change compared to 11/08/2020 tracing.  Blood sugar appropriate at 131.  UA was normal with no evidence of dehydration.  Orthostatic vital signs were normal.  CBC and CMP are pending.  Low suspicion for pyelonephritis given patient is afebrile without significant CVA tenderness.  Discussed that flank pain can be related to  nephrolithiasis but given no hematuria on UA or significant CVA tenderness KUB was deferred since this is not the preferred imaging modality to rule out kidney stone.  Suspect that back pain is musculoskeletal in etiology and encouraged her to use heat, rest, stretch for symptom relief.  Recommended that she rest and drink plenty of fluid.  Suspect that episodic lightheadedness is related to recent change in blood sugar with addition of GLP-1 agonist in the past few weeks.  Discussed that if that is the case and the symptoms should improve quickly.  I did recommend that she follow-up with cardiology and her PCP for further evaluation and management; she is to call to schedule an appointment.  Discussed that if she has any worsening symptoms including severe lightheadedness, syncopal episodes, chest pain, shortness of breath, nausea, vomiting, weakness she needs to go to the emergency room to which she expressed understanding.  Discharge return precautions given to which she expressed understanding.  Work excuse note provided.  Final Clinical Impressions(s) /  UC Diagnoses   Final diagnoses:  Episodic lightheadedness  Malaise and fatigue  Acute right-sided thoracic back pain     Discharge Instructions      Your vital signs were normal.  Your EKG was normal.  Your blood sugar is appropriate.  I will contact you if your lab work is abnormal and this changes are planned.  Please make sure you are drinking lots of fluid and resting.  Eat small frequent meals.  It is possible that your recent change in blood sugar is contributing to your symptoms and if that is the case symptoms should improve within a few days.  I do recommend that you follow-up with cardiology; call to schedule an appointment.  If you have any worsening symptoms including passing out, severe headache, worsening lightheadedness, chest pain, shortness of breath, nausea, vomiting you need to go to the emergency room immediately.     ED  Prescriptions   None    PDMP not reviewed this encounter.   Terrilee Croak, PA-C 05/09/22 1338

## 2022-05-13 ENCOUNTER — Encounter (HOSPITAL_COMMUNITY): Payer: Self-pay

## 2022-05-13 ENCOUNTER — Emergency Department (HOSPITAL_COMMUNITY): Payer: Medicaid Other

## 2022-05-13 ENCOUNTER — Other Ambulatory Visit: Payer: Self-pay

## 2022-05-13 ENCOUNTER — Emergency Department (HOSPITAL_COMMUNITY)
Admission: EM | Admit: 2022-05-13 | Discharge: 2022-05-13 | Disposition: A | Discharge status: Home / Self Care | Payer: Medicaid Other | Attending: Emergency Medicine | Admitting: Emergency Medicine

## 2022-05-13 DIAGNOSIS — Z7982 Long term (current) use of aspirin: Secondary | ICD-10-CM | POA: Insufficient documentation

## 2022-05-13 DIAGNOSIS — R1031 Right lower quadrant pain: Secondary | ICD-10-CM | POA: Diagnosis not present

## 2022-05-13 DIAGNOSIS — M549 Dorsalgia, unspecified: Secondary | ICD-10-CM | POA: Diagnosis not present

## 2022-05-13 DIAGNOSIS — R739 Hyperglycemia, unspecified: Secondary | ICD-10-CM | POA: Diagnosis not present

## 2022-05-13 DIAGNOSIS — R748 Abnormal levels of other serum enzymes: Secondary | ICD-10-CM | POA: Insufficient documentation

## 2022-05-13 DIAGNOSIS — R109 Unspecified abdominal pain: Secondary | ICD-10-CM

## 2022-05-13 DIAGNOSIS — R1011 Right upper quadrant pain: Secondary | ICD-10-CM | POA: Diagnosis not present

## 2022-05-13 DIAGNOSIS — D649 Anemia, unspecified: Secondary | ICD-10-CM | POA: Diagnosis not present

## 2022-05-13 DIAGNOSIS — R300 Dysuria: Secondary | ICD-10-CM | POA: Diagnosis not present

## 2022-05-13 DIAGNOSIS — Z90710 Acquired absence of both cervix and uterus: Secondary | ICD-10-CM | POA: Diagnosis not present

## 2022-05-13 DIAGNOSIS — Z90722 Acquired absence of ovaries, bilateral: Secondary | ICD-10-CM | POA: Diagnosis not present

## 2022-05-13 LAB — COMPREHENSIVE METABOLIC PANEL
ALT: 14 U/L (ref 0–44)
AST: 14 U/L — ABNORMAL LOW (ref 15–41)
Albumin: 3.5 g/dL (ref 3.5–5.0)
Alkaline Phosphatase: 70 U/L (ref 38–126)
Anion gap: 10 (ref 5–15)
BUN: 13 mg/dL (ref 6–20)
CO2: 24 mmol/L (ref 22–32)
Calcium: 9.3 mg/dL (ref 8.9–10.3)
Chloride: 110 mmol/L (ref 98–111)
Creatinine, Ser: 0.8 mg/dL (ref 0.44–1.00)
GFR, Estimated: >60 mL/min (ref >60)
Glucose, Bld: 119 mg/dL — ABNORMAL HIGH (ref 70–99)
Potassium: 4.2 mmol/L (ref 3.5–5.1)
Sodium: 144 mmol/L (ref 135–145)
Total Bilirubin: 0.3 mg/dL (ref 0.3–1.2)
Total Protein: 6.6 g/dL (ref 6.5–8.1)

## 2022-05-13 LAB — CBC WITH DIFFERENTIAL/PLATELET
Abs Immature Granulocytes: 0.02 10*3/uL (ref 0.00–0.07)
Basophils Absolute: 0.1 10*3/uL (ref 0.0–0.1)
Basophils Relative: 1 %
Eosinophils Absolute: 0.1 10*3/uL (ref 0.0–0.5)
Eosinophils Relative: 2 %
HCT: 35.8 % — ABNORMAL LOW (ref 36.0–46.0)
Hemoglobin: 11.8 g/dL — ABNORMAL LOW (ref 12.0–15.0)
Immature Granulocytes: 0 %
Lymphocytes Relative: 37 %
Lymphs Abs: 2.8 10*3/uL (ref 0.7–4.0)
MCH: 28.1 pg (ref 26.0–34.0)
MCHC: 33 g/dL (ref 30.0–36.0)
MCV: 85.2 fL (ref 80.0–100.0)
Monocytes Absolute: 0.4 10*3/uL (ref 0.1–1.0)
Monocytes Relative: 5 %
Neutro Abs: 4.2 10*3/uL (ref 1.7–7.7)
Neutrophils Relative %: 55 %
Platelets: 338 10*3/uL (ref 150–400)
RBC: 4.2 MIL/uL (ref 3.87–5.11)
RDW: 13 % (ref 11.5–15.5)
WBC: 7.6 10*3/uL (ref 4.0–10.5)
nRBC: 0 % (ref 0.0–0.2)

## 2022-05-13 LAB — URINALYSIS, ROUTINE W REFLEX MICROSCOPIC
Bilirubin Urine: NEGATIVE
Glucose, UA: NEGATIVE mg/dL
Hgb urine dipstick: NEGATIVE
Ketones, ur: NEGATIVE mg/dL
Leukocytes,Ua: NEGATIVE
Nitrite: NEGATIVE
Protein, ur: NEGATIVE mg/dL
Specific Gravity, Urine: 1.029 (ref 1.005–1.030)
pH: 5 (ref 5.0–8.0)

## 2022-05-13 LAB — LIPASE, BLOOD: Lipase: 52 U/L — ABNORMAL HIGH (ref 11–51)

## 2022-05-13 MED ORDER — METHOCARBAMOL 500 MG PO TABS: 500.0000 mg | ORAL_TABLET | Freq: Two times a day (BID) | ORAL | 0 refills | Status: DC

## 2022-05-13 MED ORDER — KETOROLAC TROMETHAMINE 15 MG/ML IJ SOLN
15.0000 mg | Freq: Once | INTRAMUSCULAR | Status: AC
  Administered 2022-05-13: 15 mg via INTRAMUSCULAR
  Filled 2022-05-13: qty 1

## 2022-05-13 NOTE — Discharge Instructions (Signed)
Please use Tylenol or ibuprofen for pain.  You may use 600 mg ibuprofen every 6 hours or 1000 mg of Tylenol every 6 hours.  You may choose to alternate between the 2.  This would be most effective.  Not to exceed 4 g of Tylenol within 24 hours.  Not to exceed 3200 mg ibuprofen 24 hours.  As we discussed my suspicions are a musculoskeletal injury of the right flank especially in context of your normal labs versus something like biliary colic.  Did not see any evidence of kidney stone, urinary infection or other intra-abdominal abnormality.  I recommend that you follow-up with your PCP, return to the emergency department if your symptoms worsen, you develop fever, chills, persistent nausea, vomiting or pain significantly worsens.

## 2022-05-13 NOTE — ED Provider Notes (Signed)
Accepted handoff at shift change from [facility]. Please see prior provider note for more detail.   Briefly: Patient is 56 y.o.   DDX: concern for Right sided abd pain. Pressure. Dysuria. +/- stone. Can go home if scans all look normal.  I independently interpreted imaging including CT renal stone study which shows no evidence of nephrolithiasis or any other intra-abdominal abnormality. I agree with the radiologist interpretation.  Impression reevaluation of patient her symptoms seem consistent with possible muscle strain of the serratus muscles on the right side, discussed differential including biliary colic versus other.  With stable lab work, imaging at this time encouraged ibuprofen, Tylenol, muscle relaxant, and PCP follow-up.  Patient understands agrees to plan, discharged in stable condition at this time.    RISR  EDTHIS    Dorien Chihuahua 05/13/22 1555    Fredia Sorrow, MD 05/19/22 1100

## 2022-05-13 NOTE — ED Provider Triage Note (Signed)
Emergency Medicine Provider Triage Evaluation Note  KAYLEAH APPLEYARD , a 56 y.o. female  was evaluated in triage.  Pt complains of right flank pain onset this week.  Patient has had associated decreased urinary output, dysuria.  No meds tried prior to arrival.  Was evaluated in urgent care last week and had a negative work-up.  Denies fever, chills, nausea, vomiting, hematuria, vaginal bleeding, vaginal discharge.  Still has her gallbladder and appendix.  Review of Systems  Positive: As per HPI above Negative:   Physical Exam  BP 120/84 (BP Location: Right Arm)   Pulse 76   Temp 98.7 F (37.1 C) (Oral)   Resp 14   Ht '5\' 6"'$  (1.676 m)   Wt 99.3 kg   SpO2 95%   BMI 35.35 kg/m  Gen:   Awake, no distress   Resp:  Normal effort  MSK:   Moves extremities without difficulty  Other:  Right CVA tenderness to palpation  Medical Decision Making  Medically screening exam initiated at 10:44 AM.  Appropriate orders placed.  MACEE VENABLES was informed that the remainder of the evaluation will be completed by another provider, this initial triage assessment does not replace that evaluation, and the importance of remaining in the ED until their evaluation is complete.  Work-up initiated   Daily Doe A, PA-C 05/13/22 1046

## 2022-05-13 NOTE — ED Provider Notes (Signed)
[facility] EMERGENCY DEPARTMENT Provider Note   CSN: [id number] Arrival date & time: 05/13/22  1019     History Chief Complaint  Patient presents with   Flank Pain   decreased urinary output    Ellen Avery is a 56 y.o. female who presents to the emergency department with a 5-day history of right-sided abdominal pain and dysuria.  Patient was seen evaluated urgent care over the weekend had negative work-up.  She states pain is getting worse and prompted her arrival today.  She denies any nausea, vomiting, diarrhea, fever, chills.  Patient's been eating and drinking without difficulty.  No urinary urgency or frequency.  She has been taking Tylenol with little relief.  She describes the pain as pressure.   Flank Pain      Home Medications Prior to Admission medications   Medication Sig Start Date End Date Taking? Authorizing Provider  ACCU-CHEK AVIVA PLUS test strip Check blood sugar 3-4 times daily 05/25/18   [provider]  acetaminophen (TYLENOL) 650 MG CR tablet Take 1,300 mg by mouth daily.    [provider]  amoxicillin-clavulanate (AUGMENTIN) 875-125 MG tablet Take 1 tablet by mouth 2 (two) times daily. 03/19/22   Mar Daring, PA-C  aspirin EC 81 MG tablet Take 81 mg by mouth daily.    [provider]  Cholecalciferol (VITAMIN D3) 3000 units TABS Take 3,000 Units by mouth daily as needed (vitamin d).    [provider]  DULoxetine (CYMBALTA) 60 MG capsule Take 1 capsule (60 mg total) by mouth daily. 04/03/20   Sharion Balloon, NP  famotidine (PEPCID) 20 MG tablet Take 20 mg by mouth every morning. 12/27/21   [provider]  fenofibrate (TRICOR) 48 MG tablet Take 48 mg by mouth daily. 12/27/21   [provider]  fluconazole (DIFLUCAN) 150 MG tablet Take 1 tablet (150 mg total) by mouth daily. 03/28/22   Azucena Cecil, PA-C  fluticasone (FLONASE) 50 MCG/ACT nasal spray Place 2 sprays into both  nostrils daily. Patient taking differently: Place 2 sprays into both nostrils daily as needed for allergies or rhinitis. 04/06/21   Brunetta Jeans, PA-C  gabapentin (NEURONTIN) 100 MG capsule Take 1-2 capsules (100-200 mg total) by mouth See admin instructions. Take 100 mg in the morning, 100 mg at lunch, and 200 mg in the evening Patient not taking: Reported on 02/06/2022 04/03/20   Sharion Balloon, NP  gabapentin (NEURONTIN) 300 MG capsule Take 300 mg by mouth 2 (two) times daily. 12/27/21   [provider]  hydrOXYzine (ATARAX/VISTARIL) 25 MG tablet Take 1 tablet (25 mg total) by mouth every 6 (six) hours as needed for anxiety. 09/13/18   [name], [name], MD  LANTUS SOLOSTAR 100 UNIT/ML Solostar Pen Inject 75 Units into the skin at bedtime. 12/12/19   [provider]  loratadine (CLARITIN) 10 MG tablet Take 10 mg by mouth daily as needed for allergies.    [provider]  losartan-hydrochlorothiazide (HYZAAR) 100-12.5 MG tablet Take 1 tablet by mouth daily.  11/01/18   [provider]  meloxicam (MOBIC) 15 MG tablet Take 1 tablet (15 mg total) by mouth daily. 03/19/22   Mar Daring, PA-C  metFORMIN (GLUCOPHAGE) 1000 MG tablet Take 1,000 mg by mouth 2 (two) times daily with a meal.    [provider]  oxyCODONE-acetaminophen (PERCOCET/ROXICET) 5-325 MG tablet Take 1 tablet by mouth every 6 (six) hours as needed for severe pain. 02/07/22  Tegeler, Gwenyth Allegra, MD  rosuvastatin (CRESTOR) 20 MG tablet Take 20 mg by mouth at bedtime. 12/27/21   [provider]  SUMAtriptan (IMITREX) 25 MG tablet Take 1 tablet (25 mg total) by mouth once for 1 dose. May repeat in 2 hours if headache persists or recurs. 03/04/22 03/04/22  Brunetta Jeans, PA-C  tiZANidine (ZANAFLEX) 4 MG tablet Take 1 tablet (4 mg total) by mouth every 6 (six) hours as needed for muscle spasms. 01/06/21   Jacquelin Hawking, NP      Allergies    Doxycycline and Flagyl  [metronidazole]    Review of Systems   Review of Systems  Genitourinary:  Positive for flank pain.  All other systems reviewed and are negative.  Physical Exam Updated Vital Signs BP 106/75 (BP Location: Left Arm)   Pulse 65   Temp 98.7 F (37.1 C) (Oral)   Resp 16   Ht '5\' 6"'$  (1.676 m)   Wt 99.3 kg   SpO2 99%   BMI 35.35 kg/m  Physical Exam Vitals and nursing note reviewed.  Constitutional:      General: She is not in acute distress.    Appearance: Normal appearance.  HENT:     Head: Normocephalic and atraumatic.  Eyes:     General:        Right eye: No discharge.        Left eye: No discharge.  Cardiovascular:     Comments: Regular rate and rhythm.  S1/S2 are distinct without any evidence of murmur, rubs, or gallops.  Radial pulses are 2+ bilaterally.  Dorsalis pedis pulses are 2+ bilaterally.  No evidence of pedal edema. Pulmonary:     Comments: Clear to auscultation bilaterally.  Normal effort.  No respiratory distress.  No evidence of wheezes, rales, or rhonchi heard throughout. Abdominal:     General: Abdomen is flat. Bowel sounds are normal. There is no distension.     Tenderness: There is abdominal tenderness. There is no guarding or rebound.     Comments: Tenderness in the right upper and right lower quadrants.  Musculoskeletal:        General: Normal range of motion.     Cervical back: Neck supple.  Skin:    General: Skin is warm and dry.     Findings: No rash.  Neurological:     General: No focal deficit present.     Mental Status: She is alert.  Psychiatric:        Mood and Affect: Mood normal.        Behavior: Behavior normal.    ED Results / Procedures / Treatments   Labs (all labs ordered are listed, but only abnormal results are displayed) Labs Reviewed  CBC WITH DIFFERENTIAL/PLATELET - Abnormal; Notable for the following components:      Result Value   Hemoglobin 11.8 (*)    HCT 35.8 (*)    All other components within normal limits   COMPREHENSIVE METABOLIC PANEL - Abnormal; Notable for the following components:   Glucose, Bld 119 (*)    AST 14 (*)    All other components within normal limits  LIPASE, BLOOD - Abnormal; Notable for the following components:   Lipase 52 (*)    All other components within normal limits  URINALYSIS, ROUTINE W REFLEX MICROSCOPIC    EKG None  Radiology No results found.  Procedures Procedures    Medications Ordered in ED Medications  ketorolac (TORADOL) 15 MG/ML injection 15 mg (15 mg  Intramuscular Given 05/13/22 1340)    ED Course/ Medical Decision Making/ A&P Clinical Course as of 05/13/22 1509  Tue May 13, 2022  1435 CBC with Differential(!) No evidence of leukocytosis but there is evidence of mild anemia in comparison to previous results. [CF]  1435 Lipase, blood(!) Lipase is slightly elevated.  Comparison to previous results has shown higher numbers in the past.  Does not meet criteria for pancreatitis at this time. [CF]  1436 Comprehensive metabolic panel(!) Elevated glucose but otherwise no significant realities. [CF]  1436 Urinalysis, Routine w reflex microscopic Urinalysis shows no evidence of UTI or hematuria. [CF]  1436 CT Renal Stone Study Still pending [CF]  1505 Right sided abd pain. Pressure. Dysuria. +/- stone. Can go home if scans all look normal. [CP]    Clinical Course User Index [CF] Hendricks Limes, PA-C [CP] Anselmo Pickler, PA-C                           Medical Decision Making AARIYA FERRICK is a 56 y.o. female patient who presents to the emergency department today for further evaluation of right-sided abdominal pain that started yesterday.  Differential diagnosis includes pyelonephritis, kidney stone, cystitis, appendicitis.  Work-up was initiated up in triage.  I personally reviewed the labs and the results are highlighted ED course.  CT renal stone study is still pending.   Amount and/or Complexity of Data Reviewed Labs: ordered.  Decision-making details documented in ED Course. Radiology:  Decision-making details documented in ED Course.  Risk Prescription drug management. Risk Details: Give the patient Toradol.  I offered patient fluid and she declined at this time.  Due to shift change patient will be signed off to oncoming provider or ultimate disposition will be made.  CT renal stone study is still pending and disposition will depend on these results.  Given the clinical scenario thus far, I do believe that the patient will likely be safe for discharge.   Final Clinical Impression(s) / ED Diagnoses Final diagnoses:  None    Rx / DC Orders ED Discharge Orders     None         Hendricks Limes, [place] 05/13/22 1509    Jeanell Sparrow, DO 05/17/22 [phone]

## 2022-05-13 NOTE — ED Triage Notes (Signed)
EMS stated, she has had decrease of urinary output and has had rt. Flank pain and is more with trying to urinary output. Pt. Ambulatory to truck.

## 2022-06-12 ENCOUNTER — Telehealth: Payer: Medicaid Other | Admitting: Nurse Practitioner

## 2022-06-12 DIAGNOSIS — J0110 Acute frontal sinusitis, unspecified: Secondary | ICD-10-CM | POA: Diagnosis not present

## 2022-06-12 DIAGNOSIS — H1013 Acute atopic conjunctivitis, bilateral: Secondary | ICD-10-CM | POA: Diagnosis not present

## 2022-06-12 MED ORDER — OLOPATADINE HCL 0.1 % OP SOLN: 1.0000 [drp] | Freq: Two times a day (BID) | OPHTHALMIC | 0 refills | Status: AC

## 2022-06-12 MED ORDER — AMOXICILLIN-POT CLAVULANATE 875-125 MG PO TABS: 1.0000 | ORAL_TABLET | Freq: Two times a day (BID) | ORAL | 0 refills | Status: AC

## 2022-06-12 NOTE — Progress Notes (Signed)
Virtual Visit Consent   Ellen Avery, you are scheduled for a virtual visit with a [facility] provider today. Just as with appointments in the office, your consent must be obtained to participate. Your consent will be active for this visit and any virtual visit you may have with one of our providers in the next 365 days. If you have a MyChart account, a copy of this consent can be sent to you electronically.  As this is a virtual visit, video technology does not allow for your provider to perform a traditional examination. This may limit your provider's ability to fully assess your condition. If your provider identifies any concerns that need to be evaluated in person or the need to arrange testing (such as labs, EKG, etc.), we will make arrangements to do so. Although advances in technology are sophisticated, we cannot ensure that it will always work on either your end or our end. If the connection with a video visit is poor, the visit may have to be switched to a telephone visit. With either a video or telephone visit, we are not always able to ensure that we have a secure connection.  By engaging in this virtual visit, you consent to the provision of healthcare and authorize for your insurance to be billed (if applicable) for the services provided during this visit. Depending on your insurance coverage, you may receive a charge related to this service.  I need to obtain your verbal consent now. Are you willing to proceed with your visit today? Ellen Avery has provided verbal consent on 06/12/2022 for a virtual visit (video or telephone). Apolonio Schneiders, FNP  Date: 06/12/2022 5:23 PM  Virtual Visit via Video Note   I, Apolonio Schneiders, connected with  Ellen Avery  ([id number], 09/02/1966) on 06/12/22 at  5:30 PM EDT by a video-enabled telemedicine application and verified that I am speaking with the correct person using two identifiers.  Location: Patient: Virtual Visit Location Patient:  Home Provider: Virtual Visit Location Provider: Home Office   I discussed the limitations of evaluation and management by telemedicine and the availability of in person appointments. The patient expressed understanding and agreed to proceed.    History of Present Illness: Ellen Avery is a 56 y.o. who identifies as a female who was assigned female at birth, and is being seen today with complaints of sinus pressure and headache. She has also had some burning in her eyes as well.   She feels this is related to a sinus infection and feels similar to when she has had a sinus infection in the past.   She has had some nasal congestion as well   Her eyes get worse when she is outside  She does have allergies and takes Zyrtec daily   She has been taking ibuprofen for her headache with some relief.   She has been monitoring her BP and blood glucose and both were normal   Problems:  Patient Active Problem List   Diagnosis Date Noted   Chest pain in adult 07/20/2019   IDDM (insulin dependent diabetes mellitus) 07/08/2018   HTN (hypertension) 07/08/2018   HLD (hyperlipidemia) 07/08/2018   Altered mental status 03/02/2016   MDD (major depressive disorder), recurrent episode, moderate ([place]) 03/02/2016   Psychoses ([place])     Allergies:  Allergies  Allergen Reactions   Doxycycline Hives and Itching   Flagyl [Metronidazole] Hives and Itching    Benadryl    Medications:  Current Outpatient Medications:  ACCU-CHEK AVIVA PLUS test strip, Check blood sugar 3-4 times daily, Disp: , Rfl: 2   acetaminophen (TYLENOL) 650 MG CR tablet, Take 1,300 mg by mouth daily., Disp: , Rfl:    amoxicillin-clavulanate (AUGMENTIN) 875-125 MG tablet, Take 1 tablet by mouth 2 (two) times daily., Disp: 14 tablet, Rfl: 0   aspirin EC 81 MG tablet, Take 81 mg by mouth daily., Disp: , Rfl:    Cholecalciferol (VITAMIN D3) 3000 units TABS, Take 3,000 Units by mouth daily as needed (vitamin d)., Disp: , Rfl:     DULoxetine (CYMBALTA) 60 MG capsule, Take 1 capsule (60 mg total) by mouth daily., Disp: 14 capsule, Rfl: 0   famotidine (PEPCID) 20 MG tablet, Take 20 mg by mouth every morning., Disp: , Rfl:    fenofibrate (TRICOR) 48 MG tablet, Take 48 mg by mouth daily., Disp: , Rfl:    fluconazole (DIFLUCAN) 150 MG tablet, Take 1 tablet (150 mg total) by mouth daily., Disp: 1 tablet, Rfl: 0   fluticasone (FLONASE) 50 MCG/ACT nasal spray, Place 2 sprays into both nostrils daily. (Patient taking differently: Place 2 sprays into both nostrils daily as needed for allergies or rhinitis.), Disp: 16 g, Rfl: 0   gabapentin (NEURONTIN) 100 MG capsule, Take 1-2 capsules (100-200 mg total) by mouth See admin instructions. Take 100 mg in the morning, 100 mg at lunch, and 200 mg in the evening (Patient not taking: Reported on 02/06/2022), Disp: 60 capsule, Rfl: 0   gabapentin (NEURONTIN) 300 MG capsule, Take 300 mg by mouth 2 (two) times daily., Disp: , Rfl:    hydrOXYzine (ATARAX/VISTARIL) 25 MG tablet, Take 1 tablet (25 mg total) by mouth every 6 (six) hours as needed for anxiety., Disp: 20 tablet, Rfl: 0   LANTUS SOLOSTAR 100 UNIT/ML Solostar Pen, Inject 75 Units into the skin at bedtime., Disp: , Rfl:    loratadine (CLARITIN) 10 MG tablet, Take 10 mg by mouth daily as needed for allergies., Disp: , Rfl:    losartan-hydrochlorothiazide (HYZAAR) 100-12.5 MG tablet, Take 1 tablet by mouth daily. , Disp: , Rfl:    meloxicam (MOBIC) 15 MG tablet, Take 1 tablet (15 mg total) by mouth daily., Disp: 30 tablet, Rfl: 0   metFORMIN (GLUCOPHAGE) 1000 MG tablet, Take 1,000 mg by mouth 2 (two) times daily with a meal., Disp: , Rfl:    methocarbamol (ROBAXIN) 500 MG tablet, Take 1 tablet (500 mg total) by mouth 2 (two) times daily., Disp: 20 tablet, Rfl: 0   oxyCODONE-acetaminophen (PERCOCET/ROXICET) 5-325 MG tablet, Take 1 tablet by mouth every 6 (six) hours as needed for severe pain., Disp: 15 tablet, Rfl: 0   rosuvastatin (CRESTOR) 20  MG tablet, Take 20 mg by mouth at bedtime., Disp: , Rfl:    SUMAtriptan (IMITREX) 25 MG tablet, Take 1 tablet (25 mg total) by mouth once for 1 dose. May repeat in 2 hours if headache persists or recurs., Disp: 10 tablet, Rfl: 0   tiZANidine (ZANAFLEX) 4 MG tablet, Take 1 tablet (4 mg total) by mouth every 6 (six) hours as needed for muscle spasms., Disp: 30 tablet, Rfl: 0  Observations/Objective: Patient is well-developed, well-nourished in no acute distress.  Resting comfortably  at home.  Head is normocephalic, atraumatic.  No labored breathing.  Speech is clear and coherent with logical content.  Patient is alert and oriented at baseline.    Assessment and Plan: 1. Acute non-recurrent frontal sinusitis  - amoxicillin-clavulanate (AUGMENTIN) 875-125 MG tablet; Take 1 tablet by mouth  2 (two) times daily for 7 days. Take with food  Dispense: 14 tablet; Refill: 0  2. Allergic conjunctivitis of both eyes  - olopatadine (PATANOL) 0.1 % ophthalmic solution; Place 1 drop into both eyes 2 (two) times daily for 10 days.  Dispense: 1 mL; Refill: 0     Follow Up Instructions: I discussed the assessment and treatment plan with the patient. The patient was provided an opportunity to ask questions and all were answered. The patient agreed with the plan and demonstrated an understanding of the instructions.  A copy of instructions were sent to the patient via MyChart unless otherwise noted below.    The patient was advised to call back or seek an in-person evaluation if the symptoms worsen or if the condition fails to improve as anticipated.  Time:  I spent 10 minutes with the patient via telehealth technology discussing the above problems/concerns.    Apolonio Schneiders, FNP

## 2022-06-29 ENCOUNTER — Telehealth: Payer: Medicaid Other | Admitting: Nurse Practitioner

## 2022-06-29 DIAGNOSIS — M797 Fibromyalgia: Secondary | ICD-10-CM | POA: Diagnosis not present

## 2022-06-29 MED ORDER — PREDNISONE 20 MG PO TABS: 40.0000 mg | ORAL_TABLET | Freq: Every day | ORAL | 0 refills | Status: AC

## 2022-06-29 MED ORDER — CYCLOBENZAPRINE HCL 10 MG PO TABS: 10.0000 mg | ORAL_TABLET | Freq: Three times a day (TID) | ORAL | 0 refills | Status: DC | PRN

## 2022-06-29 NOTE — Patient Instructions (Addendum)
Ellen Avery, thank you for joining Ellen Pounds, NP for today's virtual visit.  While this provider is not your primary care provider (PCP), if your PCP is located in our provider database this encounter information will be shared with them immediately following your visit.  Consent: (Patient) Ellen Avery provided verbal consent for this virtual visit at the beginning of the encounter.  Current Medications:  Current Outpatient Medications:    cyclobenzaprine (FLEXERIL) 10 MG tablet, Take 1 tablet (10 mg total) by mouth 3 (three) times daily as needed for muscle spasms., Disp: 30 tablet, Rfl: 0   predniSONE (DELTASONE) 20 MG tablet, Take 2 tablets (40 mg total) by mouth daily with breakfast for 3 days., Disp: 6 tablet, Rfl: 0   ACCU-CHEK AVIVA PLUS test strip, Check blood sugar 3-4 times daily, Disp: , Rfl: 2   acetaminophen (TYLENOL) 650 MG CR tablet, Take 1,300 mg by mouth daily., Disp: , Rfl:    amoxicillin-clavulanate (AUGMENTIN) 875-125 MG tablet, Take 1 tablet by mouth 2 (two) times daily., Disp: 14 tablet, Rfl: 0   aspirin EC 81 MG tablet, Take 81 mg by mouth daily., Disp: , Rfl:    Cholecalciferol (VITAMIN D3) 3000 units TABS, Take 3,000 Units by mouth daily as needed (vitamin d)., Disp: , Rfl:    DULoxetine (CYMBALTA) 60 MG capsule, Take 1 capsule (60 mg total) by mouth daily., Disp: 14 capsule, Rfl: 0   famotidine (PEPCID) 20 MG tablet, Take 20 mg by mouth every morning., Disp: , Rfl:    fenofibrate (TRICOR) 48 MG tablet, Take 48 mg by mouth daily., Disp: , Rfl:    fluconazole (DIFLUCAN) 150 MG tablet, Take 1 tablet (150 mg total) by mouth daily., Disp: 1 tablet, Rfl: 0   fluticasone (FLONASE) 50 MCG/ACT nasal spray, Place 2 sprays into both nostrils daily. (Patient taking differently: Place 2 sprays into both nostrils daily as needed for allergies or rhinitis.), Disp: 16 g, Rfl: 0   gabapentin (NEURONTIN) 100 MG capsule, Take 1-2 capsules (100-200 mg total) by mouth See admin  instructions. Take 100 mg in the morning, 100 mg at lunch, and 200 mg in the evening (Patient not taking: Reported on 02/06/2022), Disp: 60 capsule, Rfl: 0   gabapentin (NEURONTIN) 300 MG capsule, Take 300 mg by mouth 2 (two) times daily., Disp: , Rfl:    hydrOXYzine (ATARAX/VISTARIL) 25 MG tablet, Take 1 tablet (25 mg total) by mouth every 6 (six) hours as needed for anxiety., Disp: 20 tablet, Rfl: 0   LANTUS SOLOSTAR 100 UNIT/ML Solostar Pen, Inject 75 Units into the skin at bedtime., Disp: , Rfl:    loratadine (CLARITIN) 10 MG tablet, Take 10 mg by mouth daily as needed for allergies., Disp: , Rfl:    losartan-hydrochlorothiazide (HYZAAR) 100-12.5 MG tablet, Take 1 tablet by mouth daily. , Disp: , Rfl:    meloxicam (MOBIC) 15 MG tablet, Take 1 tablet (15 mg total) by mouth daily., Disp: 30 tablet, Rfl: 0   metFORMIN (GLUCOPHAGE) 1000 MG tablet, Take 1,000 mg by mouth 2 (two) times daily with a meal., Disp: , Rfl:    methocarbamol (ROBAXIN) 500 MG tablet, Take 1 tablet (500 mg total) by mouth 2 (two) times daily., Disp: 20 tablet, Rfl: 0   oxyCODONE-acetaminophen (PERCOCET/ROXICET) 5-325 MG tablet, Take 1 tablet by mouth every 6 (six) hours as needed for severe pain., Disp: 15 tablet, Rfl: 0   rosuvastatin (CRESTOR) 20 MG tablet, Take 20 mg by mouth at bedtime., Disp: ,  Rfl:    SUMAtriptan (IMITREX) 25 MG tablet, Take 1 tablet (25 mg total) by mouth once for 1 dose. May repeat in 2 hours if headache persists or recurs., Disp: 10 tablet, Rfl: 0   tiZANidine (ZANAFLEX) 4 MG tablet, Take 1 tablet (4 mg total) by mouth every 6 (six) hours as needed for muscle spasms., Disp: 30 tablet, Rfl: 0   Medications ordered in this encounter:  Meds ordered this encounter  Medications   cyclobenzaprine (FLEXERIL) 10 MG tablet    Sig: Take 1 tablet (10 mg total) by mouth 3 (three) times daily as needed for muscle spasms.    Dispense:  30 tablet    Refill:  0    Order Specific Question:   Supervising Provider     Answer:   Sabra Heck, BRIAN [[id number]]   predniSONE (DELTASONE) 20 MG tablet    Sig: Take 2 tablets (40 mg total) by mouth daily with breakfast for 3 days.    Dispense:  6 tablet    Refill:  0    Order Specific Question:   Supervising Provider    Answer:   Sabra Heck, BRIAN [[id number]]     *If you need refills on other medications prior to your next appointment, please contact your pharmacy*  Follow-Up: Call back or seek an in-person evaluation if the symptoms worsen or if the condition fails to improve as anticipated.  Other Instructions She is aware not to take tizanidine while taking flexeril/cyclobenzaprine   If you have been instructed to have an in-person evaluation today at a local Urgent Care facility, please use the link below. It will take you to a list of all of our available [facility], including address, phone number and hours of operation. Please do not delay care.  [facility]  If you or a family member do not have a primary care provider, use the link below to schedule a visit and establish care. When you choose a [facility] primary care physician or advanced practice provider, you gain a long-term partner in health. Find a Primary Care Provider  Learn more about [facility]'s in-office and virtual care options: [facility]

## 2022-06-29 NOTE — Progress Notes (Signed)
Virtual Visit Consent   Ellen Avery, you are scheduled for a virtual visit with a [facility] provider today. Just as with appointments in the office, your consent must be obtained to participate. Your consent will be active for this visit and any virtual visit you may have with one of our providers in the next 365 days. If you have a MyChart account, a copy of this consent can be sent to you electronically.  As this is a virtual visit, video technology does not allow for your provider to perform a traditional examination. This may limit your provider's ability to fully assess your condition. If your provider identifies any concerns that need to be evaluated in person or the need to arrange testing (such as labs, EKG, etc.), we will make arrangements to do so. Although advances in technology are sophisticated, we cannot ensure that it will always work on either your end or our end. If the connection with a video visit is poor, the visit may have to be switched to a telephone visit. With either a video or telephone visit, we are not always able to ensure that we have a secure connection.  By engaging in this virtual visit, you consent to the provision of healthcare and authorize for your insurance to be billed (if applicable) for the services provided during this visit. Depending on your insurance coverage, you may receive a charge related to this service.  I need to obtain your verbal consent now. Are you willing to proceed with your visit today? Ellen Avery has provided verbal consent on 06/29/2022 for a virtual visit (video or telephone). [name], NP  Date: 06/29/2022 8:43 AM  Virtual Visit via Video Note   I, [name], connected with  Ellen Avery  ([id number], June 06, 1966) on 06/29/22 at  8:30 AM EDT by a video-enabled telemedicine application and verified that I am speaking with the correct person using two identifiers.  Location: Patient: Virtual Visit Location Patient:  Home Provider: Virtual Visit Location Provider: Home Office   I discussed the limitations of evaluation and management by telemedicine and the availability of in person appointments. The patient expressed understanding and agreed to proceed.    History of Present Illness: Ellen Avery is a 56 y.o. who identifies as a female who was assigned female at birth, and is being seen today for muscle spasms and flare of fibromyalgia.    She is currently taking gabapentin and cymbalta which are usually effective in pain control however last night she began to have significant muscle spasms in her entire body including joint pain and has not been able to sleep due to pain. Medications prescribed for her in the past for similar flare symptoms include cyclobenzaprine and prednisone which were both effective.  HPI: HPI  Problems:  Patient Active Problem List   Diagnosis Date Noted   Chest pain in adult 07/20/2019   IDDM (insulin dependent diabetes mellitus) 07/08/2018   HTN (hypertension) 07/08/2018   HLD (hyperlipidemia) 07/08/2018   Altered mental status 03/02/2016   MDD (major depressive disorder), recurrent episode, moderate ([place]) 03/02/2016   Psychoses ([place])     Allergies:  Allergies  Allergen Reactions   Doxycycline Hives and Itching   Flagyl [Metronidazole] Hives and Itching    Benadryl    Medications:  Current Outpatient Medications:    cyclobenzaprine (FLEXERIL) 10 MG tablet, Take 1 tablet (10 mg total) by mouth 3 (three) times daily as needed for muscle spasms., Disp: 30  tablet, Rfl: 0   predniSONE (DELTASONE) 20 MG tablet, Take 2 tablets (40 mg total) by mouth daily with breakfast for 3 days., Disp: 6 tablet, Rfl: 0   ACCU-CHEK AVIVA PLUS test strip, Check blood sugar 3-4 times daily, Disp: , Rfl: 2   acetaminophen (TYLENOL) 650 MG CR tablet, Take 1,300 mg by mouth daily., Disp: , Rfl:    amoxicillin-clavulanate (AUGMENTIN) 875-125 MG tablet, Take 1 tablet by mouth 2 (two) times  daily., Disp: 14 tablet, Rfl: 0   aspirin EC 81 MG tablet, Take 81 mg by mouth daily., Disp: , Rfl:    Cholecalciferol (VITAMIN D3) 3000 units TABS, Take 3,000 Units by mouth daily as needed (vitamin d)., Disp: , Rfl:    DULoxetine (CYMBALTA) 60 MG capsule, Take 1 capsule (60 mg total) by mouth daily., Disp: 14 capsule, Rfl: 0   famotidine (PEPCID) 20 MG tablet, Take 20 mg by mouth every morning., Disp: , Rfl:    fenofibrate (TRICOR) 48 MG tablet, Take 48 mg by mouth daily., Disp: , Rfl:    fluconazole (DIFLUCAN) 150 MG tablet, Take 1 tablet (150 mg total) by mouth daily., Disp: 1 tablet, Rfl: 0   fluticasone (FLONASE) 50 MCG/ACT nasal spray, Place 2 sprays into both nostrils daily. (Patient taking differently: Place 2 sprays into both nostrils daily as needed for allergies or rhinitis.), Disp: 16 g, Rfl: 0   gabapentin (NEURONTIN) 100 MG capsule, Take 1-2 capsules (100-200 mg total) by mouth See admin instructions. Take 100 mg in the morning, 100 mg at lunch, and 200 mg in the evening (Patient not taking: Reported on 02/06/2022), Disp: 60 capsule, Rfl: 0   gabapentin (NEURONTIN) 300 MG capsule, Take 300 mg by mouth 2 (two) times daily., Disp: , Rfl:    hydrOXYzine (ATARAX/VISTARIL) 25 MG tablet, Take 1 tablet (25 mg total) by mouth every 6 (six) hours as needed for anxiety., Disp: 20 tablet, Rfl: 0   LANTUS SOLOSTAR 100 UNIT/ML Solostar Pen, Inject 75 Units into the skin at bedtime., Disp: , Rfl:    loratadine (CLARITIN) 10 MG tablet, Take 10 mg by mouth daily as needed for allergies., Disp: , Rfl:    losartan-hydrochlorothiazide (HYZAAR) 100-12.5 MG tablet, Take 1 tablet by mouth daily. , Disp: , Rfl:    meloxicam (MOBIC) 15 MG tablet, Take 1 tablet (15 mg total) by mouth daily., Disp: 30 tablet, Rfl: 0   metFORMIN (GLUCOPHAGE) 1000 MG tablet, Take 1,000 mg by mouth 2 (two) times daily with a meal., Disp: , Rfl:    methocarbamol (ROBAXIN) 500 MG tablet, Take 1 tablet (500 mg total) by mouth 2 (two)  times daily., Disp: 20 tablet, Rfl: 0   oxyCODONE-acetaminophen (PERCOCET/ROXICET) 5-325 MG tablet, Take 1 tablet by mouth every 6 (six) hours as needed for severe pain., Disp: 15 tablet, Rfl: 0   rosuvastatin (CRESTOR) 20 MG tablet, Take 20 mg by mouth at bedtime., Disp: , Rfl:    SUMAtriptan (IMITREX) 25 MG tablet, Take 1 tablet (25 mg total) by mouth once for 1 dose. May repeat in 2 hours if headache persists or recurs., Disp: 10 tablet, Rfl: 0   tiZANidine (ZANAFLEX) 4 MG tablet, Take 1 tablet (4 mg total) by mouth every 6 (six) hours as needed for muscle spasms., Disp: 30 tablet, Rfl: 0  Observations/Objective: Patient is well-developed, well-nourished in no acute distress.  Resting in bed  at home.  Head is normocephalic, atraumatic.  No labored breathing.  Speech is clear and coherent with logical  content.  Patient is alert and oriented at baseline.    Assessment and Plan: 1. Fibromyalgia affecting multiple sites - cyclobenzaprine (FLEXERIL) 10 MG tablet; Take 1 tablet (10 mg total) by mouth 3 (three) times daily as needed for muscle spasms.  Dispense: 30 tablet; Refill: 0 - predniSONE (DELTASONE) 20 MG tablet; Take 2 tablets (40 mg total) by mouth daily with breakfast for 3 days.  Dispense: 6 tablet; Refill: 0   She is aware not to take tizanidine while taking flexeril/cyclobenzaprine  Follow Up Instructions: I discussed the assessment and treatment plan with the patient. The patient was provided an opportunity to ask questions and all were answered. The patient agreed with the plan and demonstrated an understanding of the instructions.  A copy of instructions were sent to the patient via MyChart unless otherwise noted below.    The patient was advised to call back or seek an in-person evaluation if the symptoms worsen or if the condition fails to improve as anticipated.  Time:  I spent 12 minutes with the patient via telehealth technology discussing the above problems/concerns.     [name], NP

## 2022-07-15 ENCOUNTER — Telehealth: Payer: Medicaid Other | Admitting: Family Medicine

## 2022-07-15 DIAGNOSIS — M797 Fibromyalgia: Secondary | ICD-10-CM

## 2022-07-15 NOTE — Progress Notes (Signed)
[facility]   No improvement in her symptoms with Fibromyalgia after treatment with pred and flexeril. She is overall feeling worse. She is recommended to in person care.  Patient acknowledged agreement and understanding of the plan.

## 2022-07-15 NOTE — Patient Instructions (Signed)
Please call you PCP if they can not see you please go to the nearest Urgent Care for help with your symptoms.

## 2022-07-16 DIAGNOSIS — E1142 Type 2 diabetes mellitus with diabetic polyneuropathy: Secondary | ICD-10-CM | POA: Diagnosis not present

## 2022-07-16 DIAGNOSIS — M797 Fibromyalgia: Secondary | ICD-10-CM | POA: Diagnosis not present

## 2022-07-16 DIAGNOSIS — I10 Essential (primary) hypertension: Secondary | ICD-10-CM | POA: Diagnosis not present

## 2022-08-11 ENCOUNTER — Other Ambulatory Visit (INDEPENDENT_AMBULATORY_CARE_PROVIDER_SITE_OTHER): Payer: Self-pay | Admitting: Nurse Practitioner

## 2022-08-11 ENCOUNTER — Telehealth: Payer: Medicaid Other | Admitting: Nurse Practitioner

## 2022-08-11 DIAGNOSIS — M797 Fibromyalgia: Secondary | ICD-10-CM

## 2022-08-11 MED ORDER — IBUPROFEN 600 MG PO TABS: 600.0000 mg | ORAL_TABLET | Freq: Three times a day (TID) | ORAL | 0 refills | Status: DC | PRN

## 2022-08-11 MED ORDER — METHOCARBAMOL 500 MG PO TABS: 500.0000 mg | ORAL_TABLET | Freq: Four times a day (QID) | ORAL | 0 refills | Status: DC

## 2022-08-11 NOTE — Progress Notes (Signed)
Virtual Visit Consent   [name], you are scheduled for a virtual visit with a Ellen Avery provider today. Just as with appointments in the office, your consent must be obtained to participate. Your consent will be active for this visit and any virtual visit you may have with one of our providers in the next 365 days. If you have a MyChart account, a copy of this consent can be sent to you electronically.  As this is a virtual visit, video technology does not allow for your provider to perform a traditional examination. This may limit your provider's ability to fully assess your condition. If your provider identifies any concerns that need to be evaluated in person or the need to arrange testing (such as labs, EKG, etc.), we will make arrangements to do so. Although advances in technology are sophisticated, we cannot ensure that it will always work on either your end or our end. If the connection with a video visit is poor, the visit may have to be switched to a telephone visit. With either a video or telephone visit, we are not always able to ensure that we have a secure connection.  By engaging in this virtual visit, you consent to the provision of healthcare and authorize for your insurance to be billed (if applicable) for the services provided during this visit. Depending on your insurance coverage, you may receive a charge related to this service.  I need to obtain your verbal consent now. Are you willing to proceed with your visit today? Ellen Avery has provided verbal consent on 08/11/2022 for a virtual visit (video or telephone). [name], NP  Date: 08/11/2022 1:01 PM  Virtual Visit via Video Note   I, [name], connected with  Ellen Avery  ([id number], 04/29/1966) on 08/11/22 at 12:15 PM EDT by a video-enabled telemedicine application and verified that I am speaking with the correct person using two identifiers.  Location: Patient: Virtual Visit Location Patient:  Home Provider: Virtual Visit Location Provider: Home Office   I discussed the limitations of evaluation and management by telemedicine and the availability of in person appointments. The patient expressed understanding and agreed to proceed.    History of Present Illness: Ellen Avery is a 56 y.o. who identifies as a female who was assigned female at birth, and is being seen today for flare up of fibromyalgia.  Notes generalized pain, stiffness, aching and tenderness.  Usually takes a muscle relaxant and NSAIDs for several days to help her feel better.    Problems:  Patient Active Problem List   Diagnosis Date Noted   Chest pain in adult 07/20/2019   IDDM (insulin dependent diabetes mellitus) 07/08/2018   HTN (hypertension) 07/08/2018   HLD (hyperlipidemia) 07/08/2018   Altered mental status 03/02/2016   MDD (major depressive disorder), recurrent episode, moderate ([place]) 03/02/2016   Psychoses ([place])     Allergies:  Allergies  Allergen Reactions   Doxycycline Hives and Itching   Flagyl [Metronidazole] Hives and Itching    Benadryl    Medications:  Current Outpatient Medications:    ibuprofen (ADVIL) 600 MG tablet, Take 1 tablet (600 mg total) by mouth every 8 (eight) hours as needed., Disp: 30 tablet, Rfl: 0   methocarbamol (ROBAXIN) 500 MG tablet, Take 1 tablet (500 mg total) by mouth 4 (four) times daily., Disp: 40 tablet, Rfl: 0   ACCU-CHEK AVIVA PLUS test strip, Check blood sugar 3-4 times daily, Disp: , Rfl: 2  aspirin EC 81 MG tablet, Take 81 mg by mouth daily., Disp: , Rfl:    Cholecalciferol (VITAMIN D3) 3000 units TABS, Take 3,000 Units by mouth daily as needed (vitamin d)., Disp: , Rfl:    DULoxetine (CYMBALTA) 60 MG capsule, Take 1 capsule (60 mg total) by mouth daily., Disp: 14 capsule, Rfl: 0   famotidine (PEPCID) 20 MG tablet, Take 20 mg by mouth every morning., Disp: , Rfl:    fenofibrate (TRICOR) 48 MG tablet, Take 48 mg by mouth daily., Disp: , Rfl:     gabapentin (NEURONTIN) 300 MG capsule, Take 300 mg by mouth 2 (two) times daily., Disp: , Rfl:    hydrOXYzine (ATARAX/VISTARIL) 25 MG tablet, Take 1 tablet (25 mg total) by mouth every 6 (six) hours as needed for anxiety., Disp: 20 tablet, Rfl: 0   LANTUS SOLOSTAR 100 UNIT/ML Solostar Pen, Inject 75 Units into the skin at bedtime., Disp: , Rfl:    losartan-hydrochlorothiazide (HYZAAR) 100-12.5 MG tablet, Take 1 tablet by mouth daily. , Disp: , Rfl:    metFORMIN (GLUCOPHAGE) 1000 MG tablet, Take 1,000 mg by mouth 2 (two) times daily with a meal., Disp: , Rfl:    rosuvastatin (CRESTOR) 20 MG tablet, Take 20 mg by mouth at bedtime., Disp: , Rfl:   Observations/Objective: Patient is well-developed, well-nourished in no acute distress.  Resting comfortably  at home.  Head is normocephalic, atraumatic.  No labored breathing.  Speech is clear and coherent with logical content.  Patient is alert and oriented at baseline.    Assessment and Plan: 1. Fibromyalgia muscle pain - ibuprofen (ADVIL) 600 MG tablet; Take 1 tablet (600 mg total) by mouth every 8 (eight) hours as needed.  Dispense: 30 tablet; Refill: 0 - methocarbamol (ROBAXIN) 500 MG tablet; Take 1 tablet (500 mg total) by mouth 4 (four) times daily.  Dispense: 40 tablet; Refill: 0  Heat application for myalgias.   Follow Up Instructions: I discussed the assessment and treatment plan with the patient. The patient was provided an opportunity to ask questions and all were answered. The patient agreed with the plan and demonstrated an understanding of the instructions.  A copy of instructions were sent to the patient via MyChart unless otherwise noted below.    The patient was advised to call back or seek an in-person evaluation if the symptoms worsen or if the condition fails to improve as anticipated.  Time:  I spent 11 minutes with the patient via telehealth technology discussing the above problems/concerns.    [name], NP

## 2022-08-11 NOTE — Patient Instructions (Signed)
[name], thank you for joining [name], NP for today's virtual visit.  While this provider is not your primary care provider (PCP), if your PCP is located in our provider database this encounter information will be shared with them immediately following your visit.  Consent: (Patient) Ellen Avery provided verbal consent for this virtual visit at the beginning of the encounter.  Current Medications:  Current Outpatient Medications:    ibuprofen (ADVIL) 600 MG tablet, Take 1 tablet (600 mg total) by mouth every 8 (eight) hours as needed., Disp: 30 tablet, Rfl: 0   methocarbamol (ROBAXIN) 500 MG tablet, Take 1 tablet (500 mg total) by mouth 4 (four) times daily., Disp: 40 tablet, Rfl: 0   ACCU-CHEK AVIVA PLUS test strip, Check blood sugar 3-4 times daily, Disp: , Rfl: 2   aspirin EC 81 MG tablet, Take 81 mg by mouth daily., Disp: , Rfl:    Cholecalciferol (VITAMIN D3) 3000 units TABS, Take 3,000 Units by mouth daily as needed (vitamin d)., Disp: , Rfl:    DULoxetine (CYMBALTA) 60 MG capsule, Take 1 capsule (60 mg total) by mouth daily., Disp: 14 capsule, Rfl: 0   famotidine (PEPCID) 20 MG tablet, Take 20 mg by mouth every morning., Disp: , Rfl:    fenofibrate (TRICOR) 48 MG tablet, Take 48 mg by mouth daily., Disp: , Rfl:    gabapentin (NEURONTIN) 300 MG capsule, Take 300 mg by mouth 2 (two) times daily., Disp: , Rfl:    hydrOXYzine (ATARAX/VISTARIL) 25 MG tablet, Take 1 tablet (25 mg total) by mouth every 6 (six) hours as needed for anxiety., Disp: 20 tablet, Rfl: 0   LANTUS SOLOSTAR 100 UNIT/ML Solostar Pen, Inject 75 Units into the skin at bedtime., Disp: , Rfl:    losartan-hydrochlorothiazide (HYZAAR) 100-12.5 MG tablet, Take 1 tablet by mouth daily. , Disp: , Rfl:    metFORMIN (GLUCOPHAGE) 1000 MG tablet, Take 1,000 mg by mouth 2 (two) times daily with a meal., Disp: , Rfl:    rosuvastatin (CRESTOR) 20 MG tablet, Take 20 mg by mouth at bedtime., Disp: , Rfl:     Medications ordered in this encounter:  Meds ordered this encounter  Medications   ibuprofen (ADVIL) 600 MG tablet    Sig: Take 1 tablet (600 mg total) by mouth every 8 (eight) hours as needed.    Dispense:  30 tablet    Refill:  0    Order Specific Question:   Supervising Provider    Answer:   Sabra Heck, BRIAN [[id number]]   methocarbamol (ROBAXIN) 500 MG tablet    Sig: Take 1 tablet (500 mg total) by mouth 4 (four) times daily.    Dispense:  40 tablet    Refill:  0    Order Specific Question:   Supervising Provider    Answer:   Sabra Heck, BRIAN [[id number]]     *If you need refills on other medications prior to your next appointment, please contact your pharmacy*  Follow-Up: Call back or seek an in-person evaluation if the symptoms worsen or if the condition fails to improve as anticipated.  Other Instructions Heat therapy for myalgias   If you have been instructed to have an in-person evaluation today at a local Urgent Care facility, please use the link below. It will take you to a list of all of our available [facility], including address, phone number and hours of operation. Please do not delay care.  [facility]  If you  or a family member do not have a primary care provider, use the link below to schedule a visit and establish care. When you choose a [facility] primary care physician or advanced practice provider, you gain a long-term partner in health. Find a Primary Care Provider  Learn more about [facility]'s in-office and virtual care options: [facility]

## 2022-08-12 ENCOUNTER — Encounter (HOSPITAL_COMMUNITY): Payer: Self-pay | Admitting: Emergency Medicine

## 2022-08-12 ENCOUNTER — Emergency Department (HOSPITAL_COMMUNITY)
Admission: EM | Admit: 2022-08-12 | Discharge: 2022-08-12 | Disposition: A | Discharge status: Home / Self Care | Payer: Medicaid Other | Attending: Emergency Medicine | Admitting: Emergency Medicine

## 2022-08-12 ENCOUNTER — Emergency Department (HOSPITAL_COMMUNITY): Payer: Medicaid Other

## 2022-08-12 DIAGNOSIS — M25562 Pain in left knee: Secondary | ICD-10-CM | POA: Insufficient documentation

## 2022-08-12 DIAGNOSIS — Z79899 Other long term (current) drug therapy: Secondary | ICD-10-CM | POA: Diagnosis not present

## 2022-08-12 DIAGNOSIS — I10 Essential (primary) hypertension: Secondary | ICD-10-CM | POA: Diagnosis not present

## 2022-08-12 DIAGNOSIS — Z7982 Long term (current) use of aspirin: Secondary | ICD-10-CM | POA: Insufficient documentation

## 2022-08-12 DIAGNOSIS — E119 Type 2 diabetes mellitus without complications: Secondary | ICD-10-CM | POA: Insufficient documentation

## 2022-08-12 DIAGNOSIS — Z7984 Long term (current) use of oral hypoglycemic drugs: Secondary | ICD-10-CM | POA: Diagnosis not present

## 2022-08-12 NOTE — ED Provider Notes (Signed)
[facility] EMERGENCY DEPARTMENT Provider Note   CSN: [id number] Arrival date & time: 08/12/22  1132     History  Chief Complaint  Patient presents with   Knee Pain    Ellen Avery is a 56 y.o. female.  Presents to the ED for evaluation of left knee pain.  Patient reports pain has been going on for approximately 1 week and has not changed in frequency or intensity.  Denies inciting injury.  Localizes the pain to the lateral aspect of the left knee.  States pain sometimes shoots down the front of her left lower leg.  Patient notices pain the most when she is walking and before she goes to bed.  Pain is not present when resting.  Describes the pain as sharp.  Denies numbness, tingling of the affected extremity, also denies increasing swelling, history of blood clots, recent immobilization.   Knee Pain      Home Medications Prior to Admission medications   Medication Sig Start Date End Date Taking? Authorizing Provider  ACCU-CHEK AVIVA PLUS test strip Check blood sugar 3-4 times daily 05/25/18   [provider]  aspirin EC 81 MG tablet Take 81 mg by mouth daily.    [provider]  Cholecalciferol (VITAMIN D3) 3000 units TABS Take 3,000 Units by mouth daily as needed (vitamin d).    [provider]  DULoxetine (CYMBALTA) 60 MG capsule Take 1 capsule (60 mg total) by mouth daily. 04/03/20   Sharion Balloon, NP  famotidine (PEPCID) 20 MG tablet Take 20 mg by mouth every morning. 12/27/21   [provider]  fenofibrate (TRICOR) 48 MG tablet Take 48 mg by mouth daily. 12/27/21   [provider]  gabapentin (NEURONTIN) 300 MG capsule Take 300 mg by mouth 2 (two) times daily. 12/27/21   [provider]  hydrOXYzine (ATARAX/VISTARIL) 25 MG tablet Take 1 tablet (25 mg total) by mouth every 6 (six) hours as needed for anxiety. 09/13/18   [name], [name], MD  ibuprofen (ADVIL) 600 MG tablet Take 1 tablet (600 mg total) by mouth  every 8 (eight) hours as needed. 08/11/22   [name], NP  LANTUS SOLOSTAR 100 UNIT/ML Solostar Pen Inject 75 Units into the skin at bedtime. 12/12/19   [provider]  losartan-hydrochlorothiazide (HYZAAR) 100-12.5 MG tablet Take 1 tablet by mouth daily.  11/01/18   [provider]  metFORMIN (GLUCOPHAGE) 1000 MG tablet Take 1,000 mg by mouth 2 (two) times daily with a meal.    [provider]  methocarbamol (ROBAXIN) 500 MG tablet Take 1 tablet (500 mg total) by mouth 4 (four) times daily. 08/11/22   [name], NP  rosuvastatin (CRESTOR) 20 MG tablet Take 20 mg by mouth at bedtime. 12/27/21   [provider]      Allergies    Doxycycline and Flagyl [metronidazole]    Review of Systems   Review of Systems  Musculoskeletal:  Positive for myalgias. Negative for joint swelling.  All other systems reviewed and are negative.   Physical Exam Updated Vital Signs BP 120/85 (BP Location: Right Arm)   Pulse 83   Temp 98.9 F (37.2 C) (Oral)   Resp 17   Ht '5\' 6"'$  (1.676 m)   Wt 99 kg   SpO2 97%   BMI 35.23 kg/m  Physical Exam Vitals and nursing note reviewed.  Constitutional:      General: She is not in acute distress.  Appearance: Normal appearance. She is normal weight. She is not ill-appearing.  HENT:     Head: Normocephalic and atraumatic.  Cardiovascular:     Pulses:          Dorsalis pedis pulses are 2+ on the right side and 2+ on the left side.  Pulmonary:     Effort: Pulmonary effort is normal. No respiratory distress.  Abdominal:     General: Abdomen is flat.  Musculoskeletal:     Cervical back: Neck supple.     Right knee: Normal.     Left knee: Bony tenderness present. No swelling, deformity, effusion or erythema. Decreased range of motion.     Comments: Patient is able to ambulate.  Range of motion decreased secondary to pain.  Mild tenderness to palpation.  No unilateral swelling or erythema, DP pulses intact.  Skin:     General: Skin is warm and dry.  Neurological:     Mental Status: She is alert and oriented to person, place, and time.  Psychiatric:        Mood and Affect: Mood normal.        Behavior: Behavior normal.     ED Results / Procedures / Treatments   Labs (all labs ordered are listed, but only abnormal results are displayed) Labs Reviewed - No data to display  EKG None  Radiology DG Knee Complete 4 Views Left  Result Date: 08/12/2022 CLINICAL DATA:  LEFT knee pain without injury EXAM: LEFT KNEE - COMPLETE 4+ VIEW COMPARISON:  None FINDINGS: No fracture of the proximal tibia or distal femur. Patella is normal. No joint effusion. IMPRESSION: No fracture or dislocation. Electronically Signed   By: Suzy Bouchard M.D.   On: 08/12/2022 13:00    Procedures Procedures    Medications Ordered in ED Medications - No data to display  ED Course/ Medical Decision Making/ A&P                           Medical Decision Making This patient presents to the ED for concern of left knee pain. The differential diagnosis includes knee strain or sprain, osteoarthritis, DVT   Co morbidities that complicate the patient evaluation  Diabetes, hypertension, fibromyalgia, anxiety  My initial workup includes x-ray left knee  Additional history obtained from: Nursing notes from this visit.   I ordered imaging studies including x-ray of the left knee I independently visualized and interpreted imaging which showed no bony abnormality I agree with the radiologist interpretation  Afebrile, hemodynamically stable.  Physical exam largely unremarkable, x-ray negative.  Pain localized to the lateral aspect of the knee. I believe patient may have a knee sprain or muscular strain.  I have low suspicion for DVT due to symptoms, lack of erythema or edema and insignificant history.  I treated patient with an Ace wrap while in the ED and given crutches for comfort.  I have instructed patient that she should take  Tylenol and ibuprofen on a schedule over the next couple of days.  She should also perform RICE therapy.  I have given her to the phone number contact orthopedics for follow-up visit.  I have given patient strict return precautions.  Stable at discharge.  At this time there does not appear to be any evidence of an acute emergency medical condition and the patient appears stable for discharge with appropriate outpatient follow up. Diagnosis was discussed with patient who verbalizes understanding of care plan and is agreeable  to discharge. I have discussed return precautions with patient who verbalizes understanding. Patient encouraged to follow-up with their PCP within 1 week.  I have also given patient phone number for orthopedics and advised to follow-up.  All questions answered.  Patient's case discussed with Dr. Vanita Panda who agrees with plan to discharge with follow-up.   Note: Portions of this report may have been transcribed using voice recognition software. Every effort was made to ensure accuracy; however, inadvertent computerized transcription errors may still be present.         Final Clinical Impression(s) / ED Diagnoses Final diagnoses:  Acute pain of left knee    Rx / DC Orders ED Discharge Orders     None         Nehemiah Massed 08/12/22 1525    Carmin Muskrat, MD 08/13/22 1030

## 2022-08-12 NOTE — ED Notes (Signed)
DC instructions reviewed with pt. PT verbalized understanding. PT DC °

## 2022-08-12 NOTE — ED Provider Triage Note (Signed)
Emergency Medicine Provider Triage Evaluation Note  Ellen Avery , a 56 y.o. female  was evaluated in triage.  Pt complains of left knee pain.  Ongoing for several days.  Patient denies any specific injuries to the knee.  Denies any redness, warmth or fevers.  Patient has no history of same pain.  Has been taken Tylenol Motrin for pain with little relief.  Pain is worse with ambulation, palpation or range of motion..  Review of Systems  Positive: Left knee pain Negative: Swelling, erythema, or warmth, fevers  Physical Exam  BP 120/85 (BP Location: Right Arm)   Pulse 83   Temp 98.9 F (37.2 C) (Oral)   Resp 17   Ht '5\' 6"'$  (1.676 m)   Wt 99 kg   SpO2 97%   BMI 35.23 kg/m  Gen:   Awake, no distress sitting in the wheelchair appears to be in no acute distress at this time answer questions appropriately. Resp:  Normal effort  MSK:   Moves extremities without difficulty, tender to palpation over the left lateral joint line.  There is no erythema, warmth or large effusion appreciated.  Compartments are soft.  DP and PT pulse are 2+.  Cap refills normal.  Sensation intact.  Patient has pain with external rotation of the left knee with a clicking sound appreciated.  Patient has no joint laxity with valgus and varus stress.  Negative anterior drawer test.  Patella tracks normally. Other:    Medical Decision Making  Medically screening exam initiated at 12:38 PM.  Appropriate orders placed.  Ellen Avery was informed that the remainder of the evaluation will be completed by another provider, this initial triage assessment does not replace that evaluation, and the importance of remaining in the ED until their evaluation is complete.  Patient presents for atraumatic left-sided knee pain.  Patient tender over the left lateral joint line.  There is no evidence of infection on exam.  No evidence of large effusion.  Low sufficient for DVT at this time.  Will obtain extremity to assess for bony  abnormality or large joint effusion.  Patient denies trauma.  Can consider meniscal injury or possible ligamentous injury versus osteoarthritis.   Doristine Devoid, PA-C 08/12/22 1240

## 2022-08-12 NOTE — Discharge Instructions (Addendum)
You have been seen today for your complaint of left knee pain. Your imaging was reassuring and showed no bony abnormalities. Home care instructions are as follows:  You should rest, ice the affected area for 15 minutes at a time multiple times throughout the day, use the Ace wrap that we gave you today for compression, and elevate the extremity above the heart when resting.  You should alternate Tylenol and ibuprofen at home for pain.  You should do this on a schedule.  You may take these medications 4 hours apart from each other.  You should follow dosing instructions on the bottle. Follow up with: Orthopedics.  You should call the phone number listed on this packet to schedule an appointment. Please seek immediate medical care if you develop any of the following symptoms: Your knee swells, and the swelling gets worse. You cannot move your knee. You have very bad knee pain that does not get better with pain medicine. At this time there does not appear to be the presence of an emergent medical condition, however there is always the potential for conditions to change. Please read and follow the below instructions.  Do not take your medicine if  develop an itchy rash, swelling in your mouth or lips, or difficulty breathing; call 911 and seek immediate emergency medical attention if this occurs.  You may review your lab tests and imaging results in their entirety on your MyChart account.  Please discuss all results of fully with your primary care provider and other specialist at your follow-up visit.  Note: Portions of this text may have been transcribed using voice recognition software. Every effort was made to ensure accuracy; however, inadvertent computerized transcription errors may still be present.

## 2022-08-12 NOTE — ED Triage Notes (Signed)
Pt reports left knee pain for a week, pain worse on outside of knee. Unsure of any injury.

## 2022-08-16 ENCOUNTER — Other Ambulatory Visit: Payer: Self-pay | Admitting: Nurse Practitioner

## 2022-08-16 DIAGNOSIS — M797 Fibromyalgia: Secondary | ICD-10-CM

## 2022-09-01 ENCOUNTER — Telehealth: Payer: Medicaid Other | Admitting: Physician Assistant

## 2022-09-01 DIAGNOSIS — B9689 Other specified bacterial agents as the cause of diseases classified elsewhere: Secondary | ICD-10-CM | POA: Diagnosis not present

## 2022-09-01 DIAGNOSIS — R3989 Other symptoms and signs involving the genitourinary system: Secondary | ICD-10-CM | POA: Diagnosis not present

## 2022-09-01 DIAGNOSIS — J019 Acute sinusitis, unspecified: Secondary | ICD-10-CM

## 2022-09-01 MED ORDER — SULFAMETHOXAZOLE-TRIMETHOPRIM 800-160 MG PO TABS: 1.0000 | ORAL_TABLET | Freq: Two times a day (BID) | ORAL | 0 refills | Status: DC

## 2022-09-01 NOTE — Progress Notes (Signed)
Virtual Visit Consent   Ellen Avery, you are scheduled for a virtual visit with a [facility] provider today. Just as with appointments in the office, your consent must be obtained to participate. Your consent will be active for this visit and any virtual visit you may have with one of our providers in the next 365 days. If you have a MyChart account, a copy of this consent can be sent to you electronically.  As this is a virtual visit, video technology does not allow for your provider to perform a traditional examination. This may limit your provider's ability to fully assess your condition. If your provider identifies any concerns that need to be evaluated in person or the need to arrange testing (such as labs, EKG, etc.), we will make arrangements to do so. Although advances in technology are sophisticated, we cannot ensure that it will always work on either your end or our end. If the connection with a video visit is poor, the visit may have to be switched to a telephone visit. With either a video or telephone visit, we are not always able to ensure that we have a secure connection.  By engaging in this virtual visit, you consent to the provision of healthcare and authorize for your insurance to be billed (if applicable) for the services provided during this visit. Depending on your insurance coverage, you may receive a charge related to this service.  I need to obtain your verbal consent now. Are you willing to proceed with your visit today? Ellen Avery has provided verbal consent on 09/01/2022 for a virtual visit (video or telephone). Mar Daring, PA-C  Date: 09/01/2022 2:05 PM  Virtual Visit via Video Note   I, Mar Daring, connected with  Ellen Avery  ([id number], 10/05/1966) on 09/01/22 at  2:00 PM EDT by a video-enabled telemedicine application and verified that I am speaking with the correct person using two identifiers.  Location: Patient: Virtual Visit Location  Patient: Home Provider: Virtual Visit Location Provider: Home Office   I discussed the limitations of evaluation and management by telemedicine and the availability of in person appointments. The patient expressed understanding and agreed to proceed.    History of Present Illness: Ellen Avery is a 56 y.o. who identifies as a female who was assigned female at birth, and is being seen today for possible UTI and possible sinus infection.  HPI: Urinary Tract Infection  This is a new problem. The current episode started in the past 7 days. The problem has been gradually worsening. The quality of the pain is described as aching and burning. The pain is moderate. There has been no fever. Associated symptoms include frequency, hesitancy and urgency. Pertinent negatives include no chills, discharge, flank pain, hematuria, nausea or vomiting. Treatments tried: AZO. There is no history of recurrent UTIs.  Sinusitis This is a new problem. The current episode started in the past 7 days. The problem has been gradually worsening since onset. There has been no fever. She is experiencing no pain. Associated symptoms include congestion, ear pain (right), headaches, sinus pressure and a sore throat (mild). Pertinent negatives include no chills, coughing, diaphoresis or hoarse voice. (Eyes feel full and itching) Treatments tried: zyrtec, flonase, benadryl. The treatment provided no relief.      Problems:  Patient Active Problem List   Diagnosis Date Noted   Chest pain in adult 07/20/2019   IDDM (insulin dependent diabetes mellitus) 07/08/2018   HTN (hypertension) 07/08/2018  HLD (hyperlipidemia) 07/08/2018   Altered mental status 03/02/2016   MDD (major depressive disorder), recurrent episode, moderate ([place]) 03/02/2016   Psychoses ([place])     Allergies:  Allergies  Allergen Reactions   Doxycycline Hives and Itching   Flagyl [Metronidazole] Hives and Itching    Benadryl    Medications:  Current  Outpatient Medications:    sulfamethoxazole-trimethoprim (BACTRIM DS) 800-160 MG tablet, Take 1 tablet by mouth 2 (two) times daily., Disp: 14 tablet, Rfl: 0   ACCU-CHEK AVIVA PLUS test strip, Check blood sugar 3-4 times daily, Disp: , Rfl: 2   aspirin EC 81 MG tablet, Take 81 mg by mouth daily., Disp: , Rfl:    Cholecalciferol (VITAMIN D3) 3000 units TABS, Take 3,000 Units by mouth daily as needed (vitamin d)., Disp: , Rfl:    DULoxetine (CYMBALTA) 60 MG capsule, Take 1 capsule (60 mg total) by mouth daily., Disp: 14 capsule, Rfl: 0   famotidine (PEPCID) 20 MG tablet, Take 20 mg by mouth every morning., Disp: , Rfl:    fenofibrate (TRICOR) 48 MG tablet, Take 48 mg by mouth daily., Disp: , Rfl:    gabapentin (NEURONTIN) 300 MG capsule, Take 300 mg by mouth 2 (two) times daily., Disp: , Rfl:    hydrOXYzine (ATARAX/VISTARIL) 25 MG tablet, Take 1 tablet (25 mg total) by mouth every 6 (six) hours as needed for anxiety., Disp: 20 tablet, Rfl: 0   ibuprofen (ADVIL) 600 MG tablet, Take 1 tablet (600 mg total) by mouth every 8 (eight) hours as needed., Disp: 30 tablet, Rfl: 0   LANTUS SOLOSTAR 100 UNIT/ML Solostar Pen, Inject 75 Units into the skin at bedtime., Disp: , Rfl:    losartan-hydrochlorothiazide (HYZAAR) 100-12.5 MG tablet, Take 1 tablet by mouth daily. , Disp: , Rfl:    metFORMIN (GLUCOPHAGE) 1000 MG tablet, Take 1,000 mg by mouth 2 (two) times daily with a meal., Disp: , Rfl:    methocarbamol (ROBAXIN) 500 MG tablet, Take 1 tablet (500 mg total) by mouth 4 (four) times daily., Disp: 40 tablet, Rfl: 0   rosuvastatin (CRESTOR) 20 MG tablet, Take 20 mg by mouth at bedtime., Disp: , Rfl:   Observations/Objective: Patient is well-developed, well-nourished in no acute distress.  Resting comfortably at home.  Head is normocephalic, atraumatic.  No labored breathing.  Speech is clear and coherent with logical content.  Patient is alert and oriented at baseline.    Assessment and Plan: 1. Acute  bacterial sinusitis - sulfamethoxazole-trimethoprim (BACTRIM DS) 800-160 MG tablet; Take 1 tablet by mouth 2 (two) times daily.  Dispense: 14 tablet; Refill: 0  2. Suspected UTI - sulfamethoxazole-trimethoprim (BACTRIM DS) 800-160 MG tablet; Take 1 tablet by mouth 2 (two) times daily.  Dispense: 14 tablet; Refill: 0  - Worsening symptoms.  - Will treat empirically with Bactrim - Continue allergy medications.  - Steam and humidifier can help - Stay well hydrated and get plenty of rest.  - Seek in person evaluation if no symptom improvement or if symptoms worsen  - May use AZO for bladder spasms - Continue to push fluids.  - Seek in person evaluation for urine culture if symptoms do not improve or if they worsen.    Follow Up Instructions: I discussed the assessment and treatment plan with the patient. The patient was provided an opportunity to ask questions and all were answered. The patient agreed with the plan and demonstrated an understanding of the instructions.  A copy of instructions were sent to the patient  via MyChart unless otherwise noted below.    The patient was advised to call back or seek an in-person evaluation if the symptoms worsen or if the condition fails to improve as anticipated.  Time:  I spent 10 minutes with the patient via telehealth technology discussing the above problems/concerns.    Mar Daring, PA-C

## 2022-09-01 NOTE — Patient Instructions (Signed)
[name], thank you for joining Mar Daring, PA-C for today's virtual visit.  While this provider is not your primary care provider (PCP), if your PCP is located in our provider database this encounter information will be shared with them immediately following your visit.  Consent: (Patient) Ellen Avery provided verbal consent for this virtual visit at the beginning of the encounter.  Current Medications:  Current Outpatient Medications:    sulfamethoxazole-trimethoprim (BACTRIM DS) 800-160 MG tablet, Take 1 tablet by mouth 2 (two) times daily., Disp: 14 tablet, Rfl: 0   ACCU-CHEK AVIVA PLUS test strip, Check blood sugar 3-4 times daily, Disp: , Rfl: 2   aspirin EC 81 MG tablet, Take 81 mg by mouth daily., Disp: , Rfl:    Cholecalciferol (VITAMIN D3) 3000 units TABS, Take 3,000 Units by mouth daily as needed (vitamin d)., Disp: , Rfl:    DULoxetine (CYMBALTA) 60 MG capsule, Take 1 capsule (60 mg total) by mouth daily., Disp: 14 capsule, Rfl: 0   famotidine (PEPCID) 20 MG tablet, Take 20 mg by mouth every morning., Disp: , Rfl:    fenofibrate (TRICOR) 48 MG tablet, Take 48 mg by mouth daily., Disp: , Rfl:    gabapentin (NEURONTIN) 300 MG capsule, Take 300 mg by mouth 2 (two) times daily., Disp: , Rfl:    hydrOXYzine (ATARAX/VISTARIL) 25 MG tablet, Take 1 tablet (25 mg total) by mouth every 6 (six) hours as needed for anxiety., Disp: 20 tablet, Rfl: 0   ibuprofen (ADVIL) 600 MG tablet, Take 1 tablet (600 mg total) by mouth every 8 (eight) hours as needed., Disp: 30 tablet, Rfl: 0   LANTUS SOLOSTAR 100 UNIT/ML Solostar Pen, Inject 75 Units into the skin at bedtime., Disp: , Rfl:    losartan-hydrochlorothiazide (HYZAAR) 100-12.5 MG tablet, Take 1 tablet by mouth daily. , Disp: , Rfl:    metFORMIN (GLUCOPHAGE) 1000 MG tablet, Take 1,000 mg by mouth 2 (two) times daily with a meal., Disp: , Rfl:    methocarbamol (ROBAXIN) 500 MG tablet, Take 1 tablet (500 mg total) by mouth 4 (four)  times daily., Disp: 40 tablet, Rfl: 0   rosuvastatin (CRESTOR) 20 MG tablet, Take 20 mg by mouth at bedtime., Disp: , Rfl:    Medications ordered in this encounter:  Meds ordered this encounter  Medications   sulfamethoxazole-trimethoprim (BACTRIM DS) 800-160 MG tablet    Sig: Take 1 tablet by mouth 2 (two) times daily.    Dispense:  14 tablet    Refill:  0    Order Specific Question:   Supervising Provider    Answer:   Chase Picket [id number]     *If you need refills on other medications prior to your next appointment, please contact your pharmacy*  Follow-Up: Call back or seek an in-person evaluation if the symptoms worsen or if the condition fails to improve as anticipated.  [place] [phone]  Other Instructions  Sinus Infection, Adult A sinus infection is soreness and swelling (inflammation) of your sinuses. Sinuses are hollow spaces in the bones around your face. They are located: Around your eyes. In the middle of your forehead. Behind your nose. In your cheekbones. Your sinuses and nasal passages are lined with a fluid called mucus. Mucus drains out of your sinuses. Swelling can trap mucus in your sinuses. This lets germs (bacteria, virus, or fungus) grow, which leads to infection. Most of the time, this condition is caused by a virus. What are the  causes? Allergies. Asthma. Germs. Things that block your nose or sinuses. Growths in the nose (nasal polyps). Chemicals or irritants in the air. A fungus. This is rare. What increases the risk? Having a weak body defense system (immune system). Doing a lot of swimming or diving. Using nasal sprays too much. Smoking. What are the signs or symptoms? The main symptoms of this condition are pain and a feeling of pressure around the sinuses. Other symptoms include: Stuffy nose (congestion). This may make it hard to breathe through your nose. Runny nose (drainage). Soreness, swelling, and warmth in  the sinuses. A cough that may get worse at night. Being unable to smell and taste. Mucus that collects in the throat or the back of the nose (postnasal drip). This may cause a sore throat or bad breath. Being very tired (fatigued). A fever. How is this diagnosed? Your symptoms. Your medical history. A physical exam. Tests to find out if your condition is short-term (acute) or long-term (chronic). Your doctor may: Check your nose for growths (polyps). Check your sinuses using a tool that has a light on one end (endoscope). Check for allergies or germs. Do imaging tests, such as an MRI or CT scan. How is this treated? Treatment for this condition depends on the cause and whether it is short-term or long-term. If caused by a virus, your symptoms should go away on their own within 10 days. You may be given medicines to relieve symptoms. They include: Medicines that shrink swollen tissue in the nose. A spray that treats swelling of the nostrils. Rinses that help get rid of thick mucus in your nose (nasal saline washes). Medicines that treat allergies (antihistamines). Over-the-counter pain relievers. If caused by bacteria, your doctor may wait to see if you will get better without treatment. You may be given antibiotic medicine if you have: A very bad infection. A weak body defense system. If caused by growths in the nose, surgery may be needed. Follow these instructions at home: Medicines Take, use, or apply over-the-counter and prescription medicines only as told by your doctor. These may include nasal sprays. If you were prescribed an antibiotic medicine, take it as told by your doctor. Do not stop taking it even if you start to feel better. Hydrate and humidify  Drink enough water to keep your pee (urine) pale yellow. Use a cool mist humidifier to keep the humidity level in your home above 50%. Breathe in steam for 10-15 minutes, 3-4 times a day, or as told by your doctor. You can  do this in the bathroom while a hot shower is running. Try not to spend time in cool or dry air. Rest Rest as much as you can. Sleep with your head raised (elevated). Make sure you get enough sleep each night. General instructions  Put a warm, moist washcloth on your face 3-4 times a day, or as often as told by your doctor. Use nasal saline washes as often as told by your doctor. Wash your hands often with soap and water. If you cannot use soap and water, use hand sanitizer. Do not smoke. Avoid being around people who are smoking (secondhand smoke). Keep all follow-up visits. Contact a doctor if: You have a fever. Your symptoms get worse. Your symptoms do not get better within 10 days. Get help right away if: You have a very bad headache. You cannot stop vomiting. You have very bad pain or swelling around your face or eyes. You have trouble seeing. You feel  confused. Your neck is stiff. You have trouble breathing. These symptoms may be an emergency. Get help right away. Call 911. Do not wait to see if the symptoms will go away. Do not drive yourself to the hospital. Summary A sinus infection is swelling of your sinuses. Sinuses are hollow spaces in the bones around your face. This condition is caused by tissues in your nose that become inflamed or swollen. This traps germs. These can lead to infection. If you were prescribed an antibiotic medicine, take it as told by your doctor. Do not stop taking it even if you start to feel better. Keep all follow-up visits. This information is not intended to replace advice given to you by your health care provider. Make sure you discuss any questions you have with your health care provider. Document Revised: 10/29/2021 Document Reviewed: 10/29/2021 Elsevier Patient Education  [place].   Urinary Tract Infection, Adult A urinary tract infection (UTI) is an infection of any part of the urinary tract. The urinary tract  includes: The kidneys. The ureters. The bladder. The urethra. These organs make, store, and get rid of pee (urine) in the body. What are the causes? This infection is caused by germs (bacteria) in your genital area. These germs grow and cause swelling (inflammation) of your urinary tract. What increases the risk? The following factors may make you more likely to develop this condition: Using a small, thin tube (catheter) to drain pee. Not being able to control when you pee or poop (incontinence). Being female. If you are female, these things can increase the risk: Using these methods to prevent pregnancy: A medicine that kills sperm (spermicide). A device that blocks sperm (diaphragm). Having low levels of a female hormone (estrogen). Being pregnant. You are more likely to develop this condition if: You have genes that add to your risk. You are sexually active. You take antibiotic medicines. You have trouble peeing because of: A prostate that is bigger than normal, if you are female. A blockage in the part of your body that drains pee from the bladder. A kidney stone. A nerve condition that affects your bladder. Not getting enough to drink. Not peeing often enough. You have other conditions, such as: Diabetes. A weak disease-fighting system (immune system). Sickle cell disease. Gout. Injury of the spine. What are the signs or symptoms? Symptoms of this condition include: Needing to pee right away. Peeing small amounts often. Pain or burning when peeing. Blood in the pee. Pee that smells bad or not like normal. Trouble peeing. Pee that is cloudy. Fluid coming from the vagina, if you are female. Pain in the belly or lower back. Other symptoms include: Vomiting. Not feeling hungry. Feeling mixed up (confused). This may be the first symptom in older adults. Being tired and grouchy (irritable). A fever. Watery poop (diarrhea). How is this treated? Taking antibiotic  medicine. Taking other medicines. Drinking enough water. In some cases, you may need to see a specialist. Follow these instructions at home:  Medicines Take over-the-counter and prescription medicines only as told by your doctor. If you were prescribed an antibiotic medicine, take it as told by your doctor. Do not stop taking it even if you start to feel better. General instructions Make sure you: Pee until your bladder is empty. Do not hold pee for a long time. Empty your bladder after sex. Wipe from front to back after peeing or pooping if you are a female. Use each tissue one time when you wipe.  Drink enough fluid to keep your pee pale yellow. Keep all follow-up visits. Contact a doctor if: You do not get better after 1-2 days. Your symptoms go away and then come back. Get help right away if: You have very bad back pain. You have very bad pain in your lower belly. You have a fever. You have chills. You feeling like you will vomit or you vomit. Summary A urinary tract infection (UTI) is an infection of any part of the urinary tract. This condition is caused by germs in your genital area. There are many risk factors for a UTI. Treatment includes antibiotic medicines. Drink enough fluid to keep your pee pale yellow. This information is not intended to replace advice given to you by your health care provider. Make sure you discuss any questions you have with your health care provider. Document Revised: 07/06/2020 Document Reviewed: 07/06/2020 Elsevier Patient Education  [place].    If you have been instructed to have an in-person evaluation today at a local Urgent Care facility, please use the link below. It will take you to a list of all of our available [facility], including address, phone number and hours of operation. Please do not delay care.  [facility]  If you or a family member do not have a primary care provider, use the link  below to schedule a visit and establish care. When you choose a [facility] primary care physician or advanced practice provider, you gain a long-term partner in health. Find a Primary Care Provider  Learn more about [facility]'s in-office and virtual care options: [facility]

## 2022-10-12 ENCOUNTER — Encounter (HOSPITAL_COMMUNITY): Payer: Self-pay

## 2022-10-12 ENCOUNTER — Ambulatory Visit (HOSPITAL_COMMUNITY)
Admission: EM | Admit: 2022-10-12 | Discharge: 2022-10-12 | Disposition: A | Discharge status: Home / Self Care | Payer: Medicaid Other | Attending: Family Medicine | Admitting: Family Medicine

## 2022-10-12 DIAGNOSIS — R109 Unspecified abdominal pain: Secondary | ICD-10-CM | POA: Diagnosis not present

## 2022-10-12 NOTE — Discharge Instructions (Signed)
Urinalysis did not have any white blood cells or red blood cells on it.  There was some sugar and it did look like you are may be a little dehydrated.   Make sure you are drinking enough water.  Try taking MiraLAX daily or a stool softener daily, along with your Linzess.  With your primary care

## 2022-10-12 NOTE — ED Provider Notes (Signed)
[facility]    CSN: [id number] Arrival date & time: 10/12/22  1522      History   Chief Complaint Chief Complaint  Patient presents with   right side pain    HPI Ellen Avery is a 56 y.o. female.   HPI Here for right sided abdominal pain that is been going on for about 2 weeks.  It was harder for 3 or 4 days, and then it will get better for 3 to 4 days.  She feels that maybe gets better when she drinks more water or if she drinks some cranberry juice.  No fever or chills.  Is not really associated with movement.  She maybe has some little sharp pains at times in the same area.  Not necessarily affected by urination, and no hematuria or dysuria.  She is chronically constipated and has to take a laxative a lot of the time.  Her last bowel movement was today after she took a laxative.  No blood in the stool.  No nausea or vomiting  Past Medical History:  Diagnosis Date   Anxiety    Arthritis    Chronic headaches    DDD (degenerative disc disease), cervical    Fibromyalgia    GERD (gastroesophageal reflux disease)    History of colon polyps    History of psychosis    Hypercholesteremia    Hypertension    IBS (irritable bowel syndrome)    MDD (major depressive disorder)    Right sided facial pain    Seasonal allergic rhinitis    Sinus congestion    12-24-2018 per pt no fever   Type 2 diabetes mellitus treated with insulin ([place])    Vitamin D deficiency    Wears dentures    full upper,  partial lower   Wears glasses     Patient Active Problem List   Diagnosis Date Noted   Chest pain in adult 07/20/2019   IDDM (insulin dependent diabetes mellitus) 07/08/2018   HTN (hypertension) 07/08/2018   HLD (hyperlipidemia) 07/08/2018   Altered mental status 03/02/2016   MDD (major depressive disorder), recurrent episode, moderate ([place]) 03/02/2016   Psychoses ([place])     Past Surgical History:  Procedure Laterality Date   ARTERY BIOPSY Right 12/28/2018    Procedure: RIGHT BIOPSY TEMPORAL ARTERY;  Surgeon: Kieth Brightly, Arta Bruce, MD;  Location: [facility];  Service: General;  Laterality: Right;   CARDIAC CATHETERIZATION     MULTIPLE TOOTH EXTRACTIONS  04/ 2019    sedation   TOTAL ABDOMINAL HYSTERECTOMY W/ BILATERAL SALPINGOOPHORECTOMY  2009    OB History   No obstetric history on file.      Home Medications    Prior to Admission medications   Medication Sig Start Date End Date Taking? Authorizing Provider  ACCU-CHEK AVIVA PLUS test strip Check blood sugar 3-4 times daily 05/25/18   [provider]  aspirin EC 81 MG tablet Take 81 mg by mouth daily.    [provider]  Cholecalciferol (VITAMIN D3) 3000 units TABS Take 3,000 Units by mouth daily as needed (vitamin d).    [provider]  DULoxetine (CYMBALTA) 60 MG capsule Take 1 capsule (60 mg total) by mouth daily. 04/03/20   Sharion Balloon, NP  famotidine (PEPCID) 20 MG tablet Take 20 mg by mouth every morning. 12/27/21   [provider]  fenofibrate (TRICOR) 48 MG tablet Take 48 mg by mouth daily. 12/27/21   [provider]  gabapentin (  NEURONTIN) 300 MG capsule Take 300 mg by mouth 2 (two) times daily. 12/27/21   [provider]  hydrOXYzine (ATARAX/VISTARIL) 25 MG tablet Take 1 tablet (25 mg total) by mouth every 6 (six) hours as needed for anxiety. 09/13/18   [name], [name], MD  ibuprofen (ADVIL) 600 MG tablet Take 1 tablet (600 mg total) by mouth every 8 (eight) hours as needed. 08/11/22   [name], NP  LANTUS SOLOSTAR 100 UNIT/ML Solostar Pen Inject 75 Units into the skin at bedtime. 12/12/19   [provider]  losartan-hydrochlorothiazide (HYZAAR) 100-12.5 MG tablet Take 1 tablet by mouth daily.  11/01/18   [provider]  metFORMIN (GLUCOPHAGE) 1000 MG tablet Take 1,000 mg by mouth 2 (two) times daily with a meal.    [provider]  methocarbamol (ROBAXIN) 500 MG tablet Take 1 tablet  (500 mg total) by mouth 4 (four) times daily. 08/11/22   [name], NP  rosuvastatin (CRESTOR) 20 MG tablet Take 20 mg by mouth at bedtime. 12/27/21   [provider]    Family History Family History  Problem Relation Age of Onset   Diabetes Mother    Heart disease Paternal Grandmother    Breast cancer Paternal Grandmother    Diabetes Paternal Uncle    Heart Problems Brother    Breast cancer Maternal Aunt    Head & neck cancer Cousin    Cervical cancer Paternal Aunt    Colon cancer Neg Hx    Colon polyps Neg Hx    Esophageal cancer Neg Hx    Rectal cancer Neg Hx    Stomach cancer Neg Hx     Social History Social History   Tobacco Use   Smoking status: Every Day    Packs/day: 1.00    Years: 35.00    Total pack years: 35.00    Types: Cigarettes   Smokeless tobacco: Never  Vaping Use   Vaping Use: Never used  Substance Use Topics   Alcohol use: No   Drug use: No     Allergies   Doxycycline and Flagyl [metronidazole]   Review of Systems Review of Systems   Physical Exam Triage Vital Signs ED Triage Vitals  Enc Vitals Group     BP 10/12/22 1631 108/75     Pulse Rate 10/12/22 1631 73     Resp 10/12/22 1631 12     Temp 10/12/22 1631 98.2 F (36.8 C)     Temp Source 10/12/22 1631 Oral     SpO2 10/12/22 1631 98 %     Weight --      Height --      Head Circumference --      Peak Flow --      Pain Score 10/12/22 1629 8     Pain Loc --      Pain Edu? --      Excl. in [place]? --    No data found.  Updated Vital Signs BP 108/75 (BP Location: Right Arm)   Pulse 73   Temp 98.2 F (36.8 C) (Oral)   Resp 12   SpO2 98%   Visual Acuity Right Eye Distance:   Left Eye Distance:   Bilateral Distance:    Right Eye Near:   Left Eye Near:    Bilateral Near:     Physical Exam Vitals reviewed.  Constitutional:      General: She is not in acute distress.    Appearance: She is not ill-appearing,  toxic-appearing or diaphoretic.  HENT:      Mouth/Throat:     Mouth: Mucous membranes are moist.  Eyes:     Extraocular Movements: Extraocular movements intact.     Conjunctiva/sclera: Conjunctivae normal.     Pupils: Pupils are equal, round, and reactive to light.  Cardiovascular:     Rate and Rhythm: Normal rate and regular rhythm.     Heart sounds: No murmur heard. Pulmonary:     Effort: Pulmonary effort is normal.     Breath sounds: Normal breath sounds.  Abdominal:     Palpations: Abdomen is soft.     Tenderness: There is abdominal tenderness (right lateral abdomen at anterior axillary line). There is no right CVA tenderness or left CVA tenderness.  Musculoskeletal:     Cervical back: Neck supple.  Lymphadenopathy:     Cervical: No cervical adenopathy.  Skin:    Coloration: Skin is not jaundiced or pale.  Neurological:     Mental Status: She is alert and oriented to person, place, and time.  Psychiatric:        Behavior: Behavior normal.      UC Treatments / Results  Labs (all labs ordered are listed, but only abnormal results are displayed) Labs Reviewed  POCT URINALYSIS DIPSTICK, ED / UC    EKG   Radiology No results found.  Procedures Procedures (including critical care time)  Medications Ordered in UC Medications - No data to display  Initial Impression / Assessment and Plan / UC Course  I have reviewed the triage vital signs and the nursing notes.  Pertinent labs & imaging results that were available during my care of the patient were reviewed by me and considered in my medical decision making (see chart for details).        Urinalysis shows over 1000 mg percent of glucose and greater than 1.03 specific gravity.  There are no white cells and no blood.  Also no nitrites.  I discussed the urinalysis findings with her, and she states that her sugars have been doing better at home.  I have asked her to drink more fluids and to begin either taking a stool softener or MiraLAX.  She already takes  Linzess daily.  I have asked her to follow-up with her primary care about this pain Final Clinical Impressions(s) / UC Diagnoses   Final diagnoses:  Abdominal pain, unspecified abdominal location     Discharge Instructions      Urinalysis did not have any white blood cells or red blood cells on it.  There was some sugar and it did look like you are may be a little dehydrated.   Make sure you are drinking enough water.  Try taking MiraLAX daily or a stool softener daily, along with your Linzess.  With your primary care     ED Prescriptions   None    I have reviewed the PDMP during this encounter.   Barrett Henle, MD 10/12/22 [phone]

## 2022-10-12 NOTE — ED Triage Notes (Signed)
Pt is here for right side pain on and off x2wks

## 2022-10-13 DIAGNOSIS — E119 Type 2 diabetes mellitus without complications: Secondary | ICD-10-CM | POA: Diagnosis not present

## 2022-10-13 LAB — POCT URINALYSIS DIPSTICK, ED / UC
Bilirubin Urine: NEGATIVE
Glucose, UA: >=1000 mg/dL — AB
Hgb urine dipstick: NEGATIVE
Leukocytes,Ua: NEGATIVE
Nitrite: NEGATIVE
Protein, ur: NEGATIVE mg/dL
Specific Gravity, Urine: >=1.03 (ref 1.005–1.030)
Urobilinogen, UA: 1 mg/dL (ref 0.0–1.0)
pH: 5.5 (ref 5.0–8.0)

## 2022-10-17 ENCOUNTER — Telehealth: Payer: Medicaid Other | Admitting: Physician Assistant

## 2022-10-17 DIAGNOSIS — J019 Acute sinusitis, unspecified: Secondary | ICD-10-CM | POA: Diagnosis not present

## 2022-10-17 DIAGNOSIS — B9689 Other specified bacterial agents as the cause of diseases classified elsewhere: Secondary | ICD-10-CM

## 2022-10-17 MED ORDER — AMOXICILLIN-POT CLAVULANATE 875-125 MG PO TABS: 1.0000 | ORAL_TABLET | Freq: Two times a day (BID) | ORAL | 0 refills | Status: DC

## 2022-10-17 NOTE — Progress Notes (Signed)
Virtual Visit Consent   Ellen Avery, you are scheduled for a virtual visit with a [facility] provider today. Just as with appointments in the office, your consent must be obtained to participate. Your consent will be active for this visit and any virtual visit you may have with one of our providers in the next 365 days. If you have a MyChart account, a copy of this consent can be sent to you electronically.  As this is a virtual visit, video technology does not allow for your provider to perform a traditional examination. This may limit your provider's ability to fully assess your condition. If your provider identifies any concerns that need to be evaluated in person or the need to arrange testing (such as labs, EKG, etc.), we will make arrangements to do so. Although advances in technology are sophisticated, we cannot ensure that it will always work on either your end or our end. If the connection with a video visit is poor, the visit may have to be switched to a telephone visit. With either a video or telephone visit, we are not always able to ensure that we have a secure connection.  By engaging in this virtual visit, you consent to the provision of healthcare and authorize for your insurance to be billed (if applicable) for the services provided during this visit. Depending on your insurance coverage, you may receive a charge related to this service.  I need to obtain your verbal consent now. Are you willing to proceed with your visit today? Ellen Avery has provided verbal consent on 10/17/2022 for a virtual visit (video or telephone). Mar Daring, PA-C  Date: 10/17/2022 4:29 PM  Virtual Visit via Video Note   I, Mar Daring, connected with  Ellen Avery  ([id number], [birth year]) on 10/17/22 at  4:15 PM EST by a video-enabled telemedicine application and verified that I am speaking with the correct person using two identifiers.  Location: Patient: Virtual Visit Location  Patient: Home Provider: Virtual Visit Location Provider: Home Office   I discussed the limitations of evaluation and management by telemedicine and the availability of in person appointments. The patient expressed understanding and agreed to proceed.    History of Present Illness: Ellen Avery is a 56 y.o. who identifies as a female who was assigned female at birth, and is being seen today for positive covid 19 exposure and having symptoms.  HPI: URI  This is a new problem. The current episode started 1 to 4 weeks ago (2 weeks). The problem has been gradually worsening. Associated symptoms include congestion, coughing, headaches, a plugged ear sensation (fullness in left ear) and sinus pain. Pertinent negatives include no diarrhea, ear pain, nausea, rhinorrhea, sore throat or vomiting. Associated symptoms comments: Pressure behind eyes, chills, mild myalgias. She has tried antihistamine and NSAIDs (zyrtec daily, flonase, benadryl with decongestant, theraflu) for the symptoms. The treatment provided no relief.   Reports she tested negative for covid 19 on at home test   Problems:  Patient Active Problem List   Diagnosis Date Noted   Chest pain in adult 07/20/2019   IDDM (insulin dependent diabetes mellitus) 07/08/2018   HTN (hypertension) 07/08/2018   HLD (hyperlipidemia) 07/08/2018   Altered mental status 03/02/2016   MDD (major depressive disorder), recurrent episode, moderate ([place]) 03/02/2016   Psychoses ([place])     Allergies:  Allergies  Allergen Reactions   Doxycycline Hives and Itching   Flagyl [Metronidazole] Hives and Itching    Benadryl  Medications:  Current Outpatient Medications:    amoxicillin-clavulanate (AUGMENTIN) 875-125 MG tablet, Take 1 tablet by mouth 2 (two) times daily., Disp: 20 tablet, Rfl: 0   ACCU-CHEK AVIVA PLUS test strip, Check blood sugar 3-4 times daily, Disp: , Rfl: 2   aspirin EC 81 MG tablet, Take 81 mg by mouth daily., Disp: , Rfl:     Cholecalciferol (VITAMIN D3) 3000 units TABS, Take 3,000 Units by mouth daily as needed (vitamin d)., Disp: , Rfl:    DULoxetine (CYMBALTA) 60 MG capsule, Take 1 capsule (60 mg total) by mouth daily., Disp: 14 capsule, Rfl: 0   famotidine (PEPCID) 20 MG tablet, Take 20 mg by mouth every morning., Disp: , Rfl:    fenofibrate (TRICOR) 48 MG tablet, Take 48 mg by mouth daily., Disp: , Rfl:    gabapentin (NEURONTIN) 300 MG capsule, Take 300 mg by mouth 2 (two) times daily., Disp: , Rfl:    hydrOXYzine (ATARAX/VISTARIL) 25 MG tablet, Take 1 tablet (25 mg total) by mouth every 6 (six) hours as needed for anxiety., Disp: 20 tablet, Rfl: 0   ibuprofen (ADVIL) 600 MG tablet, Take 1 tablet (600 mg total) by mouth every 8 (eight) hours as needed., Disp: 30 tablet, Rfl: 0   LANTUS SOLOSTAR 100 UNIT/ML Solostar Pen, Inject 75 Units into the skin at bedtime., Disp: , Rfl:    losartan-hydrochlorothiazide (HYZAAR) 100-12.5 MG tablet, Take 1 tablet by mouth daily. , Disp: , Rfl:    metFORMIN (GLUCOPHAGE) 1000 MG tablet, Take 1,000 mg by mouth 2 (two) times daily with a meal., Disp: , Rfl:    methocarbamol (ROBAXIN) 500 MG tablet, Take 1 tablet (500 mg total) by mouth 4 (four) times daily., Disp: 40 tablet, Rfl: 0   rosuvastatin (CRESTOR) 20 MG tablet, Take 20 mg by mouth at bedtime., Disp: , Rfl:   Observations/Objective: Patient is well-developed, well-nourished in no acute distress.  Resting comfortably at home.  Head is normocephalic, atraumatic.  No labored breathing.  Speech is clear and coherent with logical content.  Patient is alert and oriented at baseline.    Assessment and Plan: 1. Acute bacterial sinusitis - amoxicillin-clavulanate (AUGMENTIN) 875-125 MG tablet; Take 1 tablet by mouth 2 (two) times daily.  Dispense: 20 tablet; Refill: 0  - Worsening symptoms that have not responded to OTC medications.  - Will give Augmentin - Continue allergy medications.  - Steam and humidifier can help -  Stay well hydrated and get plenty of rest.  - Seek in person evaluation if no symptom improvement or if symptoms worsen   Follow Up Instructions: I discussed the assessment and treatment plan with the patient. The patient was provided an opportunity to ask questions and all were answered. The patient agreed with the plan and demonstrated an understanding of the instructions.  A copy of instructions were sent to the patient via MyChart unless otherwise noted below.    The patient was advised to call back or seek an in-person evaluation if the symptoms worsen or if the condition fails to improve as anticipated.  Time:  I spent 8 minutes with the patient via telehealth technology discussing the above problems/concerns.    Mar Daring, PA-C

## 2022-10-17 NOTE — Patient Instructions (Signed)
[name], thank you for joining Mar Daring, PA-C for today's virtual visit.  While this provider is not your primary care provider (PCP), if your PCP is located in our provider database this encounter information will be shared with them immediately following your visit.   [facility] account gives you access to today's visit and all your visits, tests, and labs performed at [facility] " click here if you don't have a [facility] account or go to [email or web address]  Consent: (Patient) Ellen Avery provided verbal consent for this virtual visit at the beginning of the encounter.  Current Medications:  Current Outpatient Medications:    amoxicillin-clavulanate (AUGMENTIN) 875-125 MG tablet, Take 1 tablet by mouth 2 (two) times daily., Disp: 20 tablet, Rfl: 0   ACCU-CHEK AVIVA PLUS test strip, Check blood sugar 3-4 times daily, Disp: , Rfl: 2   aspirin EC 81 MG tablet, Take 81 mg by mouth daily., Disp: , Rfl:    Cholecalciferol (VITAMIN D3) 3000 units TABS, Take 3,000 Units by mouth daily as needed (vitamin d)., Disp: , Rfl:    DULoxetine (CYMBALTA) 60 MG capsule, Take 1 capsule (60 mg total) by mouth daily., Disp: 14 capsule, Rfl: 0   famotidine (PEPCID) 20 MG tablet, Take 20 mg by mouth every morning., Disp: , Rfl:    fenofibrate (TRICOR) 48 MG tablet, Take 48 mg by mouth daily., Disp: , Rfl:    gabapentin (NEURONTIN) 300 MG capsule, Take 300 mg by mouth 2 (two) times daily., Disp: , Rfl:    hydrOXYzine (ATARAX/VISTARIL) 25 MG tablet, Take 1 tablet (25 mg total) by mouth every 6 (six) hours as needed for anxiety., Disp: 20 tablet, Rfl: 0   ibuprofen (ADVIL) 600 MG tablet, Take 1 tablet (600 mg total) by mouth every 8 (eight) hours as needed., Disp: 30 tablet, Rfl: 0   LANTUS SOLOSTAR 100 UNIT/ML Solostar Pen, Inject 75 Units into the skin at bedtime., Disp: , Rfl:    losartan-hydrochlorothiazide (HYZAAR) 100-12.5 MG tablet, Take 1 tablet by  mouth daily. , Disp: , Rfl:    metFORMIN (GLUCOPHAGE) 1000 MG tablet, Take 1,000 mg by mouth 2 (two) times daily with a meal., Disp: , Rfl:    methocarbamol (ROBAXIN) 500 MG tablet, Take 1 tablet (500 mg total) by mouth 4 (four) times daily., Disp: 40 tablet, Rfl: 0   rosuvastatin (CRESTOR) 20 MG tablet, Take 20 mg by mouth at bedtime., Disp: , Rfl:    Medications ordered in this encounter:  Meds ordered this encounter  Medications   amoxicillin-clavulanate (AUGMENTIN) 875-125 MG tablet    Sig: Take 1 tablet by mouth 2 (two) times daily.    Dispense:  20 tablet    Refill:  0    Order Specific Question:   Supervising Provider    Answer:   Chase Picket [id number]     *If you need refills on other medications prior to your next appointment, please contact your pharmacy*  Follow-Up: Call back or seek an in-person evaluation if the symptoms worsen or if the condition fails to improve as anticipated.  [place] [phone]  Other Instructions Sinus Infection, Adult A sinus infection is soreness and swelling (inflammation) of your sinuses. Sinuses are hollow spaces in the bones around your face. They are located: Around your eyes. In the middle of your forehead. Behind your nose. In your cheekbones. Your sinuses and nasal passages are lined with a fluid called mucus. Mucus drains  out of your sinuses. Swelling can trap mucus in your sinuses. This lets germs (bacteria, virus, or fungus) grow, which leads to infection. Most of the time, this condition is caused by a virus. What are the causes? Allergies. Asthma. Germs. Things that block your nose or sinuses. Growths in the nose (nasal polyps). Chemicals or irritants in the air. A fungus. This is rare. What increases the risk? Having a weak body defense system (immune system). Doing a lot of swimming or diving. Using nasal sprays too much. Smoking. What are the signs or symptoms? The main symptoms of this  condition are pain and a feeling of pressure around the sinuses. Other symptoms include: Stuffy nose (congestion). This may make it hard to breathe through your nose. Runny nose (drainage). Soreness, swelling, and warmth in the sinuses. A cough that may get worse at night. Being unable to smell and taste. Mucus that collects in the throat or the back of the nose (postnasal drip). This may cause a sore throat or bad breath. Being very tired (fatigued). A fever. How is this diagnosed? Your symptoms. Your medical history. A physical exam. Tests to find out if your condition is short-term (acute) or long-term (chronic). Your doctor may: Check your nose for growths (polyps). Check your sinuses using a tool that has a light on one end (endoscope). Check for allergies or germs. Do imaging tests, such as an MRI or CT scan. How is this treated? Treatment for this condition depends on the cause and whether it is short-term or long-term. If caused by a virus, your symptoms should go away on their own within 10 days. You may be given medicines to relieve symptoms. They include: Medicines that shrink swollen tissue in the nose. A spray that treats swelling of the nostrils. Rinses that help get rid of thick mucus in your nose (nasal saline washes). Medicines that treat allergies (antihistamines). Over-the-counter pain relievers. If caused by bacteria, your doctor may wait to see if you will get better without treatment. You may be given antibiotic medicine if you have: A very bad infection. A weak body defense system. If caused by growths in the nose, surgery may be needed. Follow these instructions at home: Medicines Take, use, or apply over-the-counter and prescription medicines only as told by your doctor. These may include nasal sprays. If you were prescribed an antibiotic medicine, take it as told by your doctor. Do not stop taking it even if you start to feel better. Hydrate and  humidify  Drink enough water to keep your pee (urine) pale yellow. Use a cool mist humidifier to keep the humidity level in your home above 50%. Breathe in steam for 10-15 minutes, 3-4 times a day, or as told by your doctor. You can do this in the bathroom while a hot shower is running. Try not to spend time in cool or dry air. Rest Rest as much as you can. Sleep with your head raised (elevated). Make sure you get enough sleep each night. General instructions  Put a warm, moist washcloth on your face 3-4 times a day, or as often as told by your doctor. Use nasal saline washes as often as told by your doctor. Wash your hands often with soap and water. If you cannot use soap and water, use hand sanitizer. Do not smoke. Avoid being around people who are smoking (secondhand smoke). Keep all follow-up visits. Contact a doctor if: You have a fever. Your symptoms get worse. Your symptoms do not get  better within 10 days. Get help right away if: You have a very bad headache. You cannot stop vomiting. You have very bad pain or swelling around your face or eyes. You have trouble seeing. You feel confused. Your neck is stiff. You have trouble breathing. These symptoms may be an emergency. Get help right away. Call 911. Do not wait to see if the symptoms will go away. Do not drive yourself to the hospital. Summary A sinus infection is swelling of your sinuses. Sinuses are hollow spaces in the bones around your face. This condition is caused by tissues in your nose that become inflamed or swollen. This traps germs. These can lead to infection. If you were prescribed an antibiotic medicine, take it as told by your doctor. Do not stop taking it even if you start to feel better. Keep all follow-up visits. This information is not intended to replace advice given to you by your health care provider. Make sure you discuss any questions you have with your health care provider. Document Revised:  10/29/2021 Document Reviewed: 10/29/2021 Elsevier Patient Education  [place].    If you have been instructed to have an in-person evaluation today at a local Urgent Care facility, please use the link below. It will take you to a list of all of our available [facility], including address, phone number and hours of operation. Please do not delay care.  [facility]  If you or a family member do not have a primary care provider, use the link below to schedule a visit and establish care. When you choose a [facility] primary care physician or advanced practice provider, you gain a long-term partner in health. Find a Primary Care Provider  Learn more about [facility] in-office and virtual care options: [facility]

## 2022-10-22 ENCOUNTER — Encounter: Payer: Self-pay | Admitting: Nurse Practitioner

## 2022-10-22 ENCOUNTER — Ambulatory Visit (INDEPENDENT_AMBULATORY_CARE_PROVIDER_SITE_OTHER): Payer: Medicaid Other | Admitting: Nurse Practitioner

## 2022-10-22 VITALS — BP 108/67 | HR 94 | Temp 98.7°F | Ht 65.5 in | Wt 222.2 lb

## 2022-10-22 DIAGNOSIS — I10 Essential (primary) hypertension: Secondary | ICD-10-CM | POA: Diagnosis not present

## 2022-10-22 DIAGNOSIS — Z716 Tobacco abuse counseling: Secondary | ICD-10-CM

## 2022-10-22 DIAGNOSIS — E782 Mixed hyperlipidemia: Secondary | ICD-10-CM

## 2022-10-22 DIAGNOSIS — M79604 Pain in right leg: Secondary | ICD-10-CM | POA: Diagnosis not present

## 2022-10-22 DIAGNOSIS — M79605 Pain in left leg: Secondary | ICD-10-CM

## 2022-10-22 DIAGNOSIS — E1165 Type 2 diabetes mellitus with hyperglycemia: Secondary | ICD-10-CM | POA: Diagnosis not present

## 2022-10-22 DIAGNOSIS — M797 Fibromyalgia: Secondary | ICD-10-CM | POA: Diagnosis not present

## 2022-10-22 LAB — POCT GLYCOSYLATED HEMOGLOBIN (HGB A1C): Hemoglobin A1C: 8.6 % — AB (ref 4.0–5.6)

## 2022-10-22 LAB — POCT URINALYSIS DIP (PROADVANTAGE DEVICE)
Bilirubin, UA: NEGATIVE
Blood, UA: NEGATIVE
Glucose, UA: 100 mg/dL — AB
Ketones, POC UA: NEGATIVE mg/dL
Leukocytes, UA: NEGATIVE
Nitrite, UA: NEGATIVE
Protein Ur, POC: NEGATIVE mg/dL
Specific Gravity, Urine: >1.03
Urobilinogen, Ur: 0.2
pH, UA: 5.5 (ref 5.0–8.0)

## 2022-10-22 MED ORDER — SEMAGLUTIDE (2 MG/DOSE) 8 MG/3ML ~~LOC~~ SOPN: 2.0000 mg | PEN_INJECTOR | SUBCUTANEOUS | 0 refills | Status: DC

## 2022-10-22 MED ORDER — SEMAGLUTIDE (1 MG/DOSE) 4 MG/3ML ~~LOC~~ SOPN: 1.0000 mg | PEN_INJECTOR | SUBCUTANEOUS | 0 refills | Status: AC

## 2022-10-22 NOTE — Assessment & Plan Note (Signed)
Lab Results  Component Value Date   HGBA1C 8.6 (A) 10/22/2022  Chronic uncontrolled condition Currently on Ozempic 0.5 mg once weekly injection, Lantus 75 units daily at bedtime States that she can do better with her diet, drinks soda Patient counseled on low-carb modified diet Start Ozempic 1 mg in 2 weeks after 4 weeks start Ozempic 2 mg once weekly injection She denies adverse reactions to Ozempic she was advised to call the office if she has abdominal pain nausea vomiting. Diabetic foot exam completed today Urine creatinine albumin labs ordered Check lipid panel at next visit as she is not fasting today.

## 2022-10-22 NOTE — Assessment & Plan Note (Signed)
Lab Results  Component Value Date   CHOL 149 03/19/2022   HDL 58 03/19/2022   LDLCALC 70 03/19/2022   TRIG 128 03/19/2022   CHOLHDL 2.6 03/19/2022  Currently on rosuvastatin 20 mg daily, fenofibrate 48 mg daily Continue current medications Avoid fatty fried foods LDL goal is less than 70

## 2022-10-22 NOTE — Progress Notes (Signed)
New Patient Office Visit  Subjective:  Patient ID: Ellen Avery, female    DOB: 1966-10-01  Age: 56 y.o. MRN: [id number]  CC:  Chief Complaint  Patient presents with   Establish Care    Not fasting     HPI Ellen Avery is a 56 y.o. female with past medical history of uncontrolled type 2 diabetes, hyperlipidemia, hypertension, tobacco abuse, major depression, anxiety, fibromyalgia, neuropathy presents to establish care for her chronic medical conditions.  Previous PCP Dr.Avbuere.  Last visit was about 2 months ago.   Uncontrolled type 2 diabetes.  Currently on Lantus 75 units daily, Ozempic 0.5 mg once weekly injection, states that she has been on Ozempic 0.5 mg since about 2 weeks ago.  She has not been taking metformin because she feels that she is on too many medications.  Patient denies hypoglycemia, polyphagia, polyuria, polydipsia.  She is up-to-date with her diabetic eye exam, states that she was told she has no retinopathy.    Hypertension currently on losartan-hydrochlorothiazide 100-12.5 mg 1 tablet daily patient denies chest pain, edema, dizziness.  Fibromyalgia/neuropathy.  Currently on Cymbalta 60 mg daily, gabapentin 600 mg twice daily.   Current smoker.  Smokes 1 pack of cigarettes daily for over 30 years.  Patient denies cough, shortness of breath, wheezing.  Had abnormal CT scan of her chest in 2021 there was a recommendation for follow-up CT scan 6 months afterwards.  Recommendation was discussed with patient but she declined referral for chest CT scan.   Patient complains of bilateral leg throbbing aching pain that started about 2 months ago.  States that she has leg pain when walking, pain is worse at night when she lays down to sleep.  Pain sometimes wakes her up at night . she has been taking gabapentin as ordered without relief.  Her mother has history of PAD.   Due for shingles vaccine and Tdap vaccine need for both vaccines discussed with patient patient  encouraged to get the vaccines at her pharmacy.      Past Medical History:  Diagnosis Date   Anxiety    Arthritis    Chronic headaches    DDD (degenerative disc disease), cervical    Fibromyalgia    GERD (gastroesophageal reflux disease)    History of colon polyps    History of psychosis    Hypercholesteremia    Hypertension    IBS (irritable bowel syndrome)    IBS (irritable bowel syndrome)    MDD (major depressive disorder)    Right sided facial pain    Seasonal allergic rhinitis    Sinus congestion    12-24-2018 per pt no fever   Type 2 diabetes mellitus treated with insulin ([place])    Vitamin D deficiency    Wears dentures    full upper,  partial lower   Wears glasses     Past Surgical History:  Procedure Laterality Date   ARTERY BIOPSY Right 12/28/2018   Procedure: RIGHT BIOPSY TEMPORAL ARTERY;  Surgeon: Kinsinger, Arta Bruce, MD;  Location: [facility];  Service: General;  Laterality: Right;   CARDIAC CATHETERIZATION     MULTIPLE TOOTH EXTRACTIONS  04/ 2019    sedation   TOTAL ABDOMINAL HYSTERECTOMY W/ BILATERAL SALPINGOOPHORECTOMY  2009    Family History  Problem Relation Age of Onset   Diabetes Mother    Heart disease Mother    High blood pressure Mother    Hyperlipidemia Mother    Diabetes Mellitus II Father  Heart Problems Brother    Breast cancer Maternal Aunt    Cervical cancer Paternal Aunt    Diabetes Paternal Uncle    Heart disease Paternal Grandmother    Breast cancer Paternal Grandmother    Head & neck cancer Cousin    Colon cancer Neg Hx    Colon polyps Neg Hx    Esophageal cancer Neg Hx    Rectal cancer Neg Hx    Stomach cancer Neg Hx     Social History   Socioeconomic History   Marital status: Divorced    Spouse name: Not on file   Number of children: 2   Years of education: Not on file   Highest education level: Not on file  Occupational History   Not on file  Tobacco Use   Smoking status: Every Day     Packs/day: 1.00    Years: 35.00    Total pack years: 35.00    Types: Cigarettes   Smokeless tobacco: Never  Vaping Use   Vaping Use: Never used  Substance and Sexual Activity   Alcohol use: No   Drug use: No   Sexual activity: Not Currently    Birth control/protection: Surgical  Other Topics Concern   Not on file  Social History Narrative   Lives home alone    Social Determinants of Health   Financial Resource Strain: Not on file  Food Insecurity: Not on file  Transportation Needs: Not on file  Physical Activity: Not on file  Stress: Not on file  Social Connections: Not on file  Intimate Partner Violence: Not on file    ROS Review of Systems  Constitutional: Negative.  Negative for activity change, appetite change, chills, diaphoresis and fatigue.  Respiratory: Negative.  Negative for cough, shortness of breath, wheezing and stridor.   Cardiovascular: Negative.  Negative for chest pain, palpitations and leg swelling.  Endocrine: Negative for polydipsia, polyphagia and polyuria.  Musculoskeletal:  Negative for gait problem, joint swelling, neck pain and neck stiffness.  Skin: Negative.   Neurological:  Negative for dizziness, facial asymmetry, light-headedness and headaches.  Psychiatric/Behavioral:  Negative for hallucinations, self-injury, sleep disturbance and suicidal ideas. The patient is not hyperactive.     Objective:   Today's Vitals: BP 108/67   Pulse 94   Temp 98.7 F (37.1 C)   Ht 5' 5.5" (1.664 m)   Wt 222 lb 3.2 oz (100.8 kg)   SpO2 98%   BMI 36.41 kg/m   Physical Exam Constitutional:      General: She is not in acute distress.    Appearance: Normal appearance. She is not ill-appearing, toxic-appearing or diaphoretic.  Eyes:     General:        Right eye: No discharge.        Left eye: No discharge.     Extraocular Movements: Extraocular movements intact.     Conjunctiva/sclera: Conjunctivae normal.     Pupils: Pupils are equal, round, and  reactive to light.  Cardiovascular:     Rate and Rhythm: Normal rate and regular rhythm.     Pulses: Normal pulses.     Heart sounds: Normal heart sounds. No murmur heard.    No friction rub. No gallop.  Pulmonary:     Effort: Pulmonary effort is normal. No respiratory distress.     Breath sounds: Normal breath sounds. No stridor. No wheezing or rales.  Chest:     Chest wall: No tenderness.  Abdominal:     General: There  is no distension.     Palpations: Abdomen is soft.     Tenderness: There is no abdominal tenderness. There is no right CVA tenderness, left CVA tenderness or guarding.  Musculoskeletal:        General: No swelling, tenderness, deformity or signs of injury.     Right lower leg: No edema.     Left lower leg: No edema.  Skin:    Capillary Refill: Capillary refill takes less than 2 seconds.     Coloration: Skin is not jaundiced or pale.     Findings: No bruising, erythema, lesion or rash.  Neurological:     Mental Status: She is alert.     Cranial Nerves: No cranial nerve deficit.     Sensory: No sensory deficit.     Motor: No weakness.     Coordination: Coordination normal.     Gait: Gait normal.  Psychiatric:        Mood and Affect: Mood normal.        Behavior: Behavior normal.        Thought Content: Thought content normal.        Judgment: Judgment normal.     Assessment & Plan:   Problem List Items Addressed This Visit       Cardiovascular and Mediastinum   HTN (hypertension)    BP Readings from Last 3 Encounters:  10/22/22 108/67  10/12/22 108/75  08/12/22 128/80  Chronic medical condition well-controlled on losartan-hydrochlorothiazide 100-12.5 mg 1 tablet daily Continue current medication DASH diet advised engage in regular moderate to vigorous exercises at least 150 minutes weekly CMP today Follow-up in 3 months      Relevant Orders   Basic Metabolic Panel     Endocrine   Uncontrolled type 2 diabetes mellitus with hyperglycemia ([place])  - Primary    Lab Results  Component Value Date   HGBA1C 8.6 (A) 10/22/2022  Chronic uncontrolled condition Currently on Ozempic 0.5 mg once weekly injection, Lantus 75 units daily at bedtime States that she can do better with her diet, drinks soda Patient counseled on low-carb modified diet Start Ozempic 1 mg in 2 weeks after 4 weeks start Ozempic 2 mg once weekly injection She denies adverse reactions to Ozempic she was advised to call the office if she has abdominal pain nausea vomiting. Diabetic foot exam completed today Urine creatinine albumin labs ordered Check lipid panel at next visit as she is not fasting today.       Relevant Medications   Semaglutide, 1 MG/DOSE, 4 MG/3ML SOPN   Semaglutide, 2 MG/DOSE, 8 MG/3ML SOPN (Start on 11/26/2022)   Other Relevant Orders   POCT Urinalysis DIP (Proadvantage Device) (Completed)   Urine microalbumin-creatinine with uACR   Basic Metabolic Panel   HgB V7Q (Completed)     Other   HLD (hyperlipidemia)    Lab Results  Component Value Date   CHOL 149 03/19/2022   HDL 58 03/19/2022   LDLCALC 70 03/19/2022   TRIG 128 03/19/2022   CHOLHDL 2.6 03/19/2022  Currently on rosuvastatin 20 mg daily, fenofibrate 48 mg daily Continue current medications Avoid fatty fried foods LDL goal is less than 70      Tobacco abuse counseling    Smokes about 1 pack/day  Asked about quitting: confirms that he/she currently smokes cigarettes Advise to quit smoking: Educated about QUITTING to reduce the risk of cancer, cardio and cerebrovascular disease. Assess willingness: Unwilling to quit at this time. Assist with counseling and pharmacotherapy: Counseled  for 5 minutes and literature provided. Arrange for follow up: follow up in 3 months and continue to offer help.   CT chest 2021 Nodular opacity in the medial aspect of the anterior segment right upper lobe remains stable, measuring 1.5 x 1.3 cm. Note that this lesion did not show significant  abnormal radiotracer uptake on prior PET study. A lesion of this nature should be followed serially without change for a minimum of 2 years to confirm benign etiology. A follow-up noncontrast chest CT in 6-9 months in this regard is reasonable. Note that there is no new opacity evident. No airspace consolidation.  Pt decline referral for chest CT scan today       Leg pain, bilateral    Since the past 2 months Due to her history of diabetes, hyperlipidemia tobacco abuse patient referred to vascular specialist to rule out PAD.       Relevant Orders   Ambulatory referral to Vascular Surgery   Fibromyalgia    Continue Cymbalta 60 mg daily, gabapentin 300 mg twice daily.       Relevant Medications   tiZANidine (ZANAFLEX) 4 MG tablet    Outpatient Encounter Medications as of 10/22/2022  Medication Sig   ACCU-CHEK AVIVA PLUS test strip Check blood sugar 3-4 times daily   amoxicillin-clavulanate (AUGMENTIN) 875-125 MG tablet Take 1 tablet by mouth 2 (two) times daily.   aspirin EC 81 MG tablet Take 81 mg by mouth daily.   Cholecalciferol (VITAMIN D3) 3000 units TABS Take 3,000 Units by mouth daily as needed (vitamin d).   DULoxetine (CYMBALTA) 60 MG capsule Take 1 capsule (60 mg total) by mouth daily.   famotidine (PEPCID) 20 MG tablet Take 20 mg by mouth every morning.   fenofibrate (TRICOR) 48 MG tablet Take 48 mg by mouth daily.   gabapentin (NEURONTIN) 300 MG capsule Take 300 mg by mouth 2 (two) times daily.   GNP ULTICARE PEN NEEDLES 32G X 4 MM MISC USE WITH insulin ONCE DAILY   hydrOXYzine (ATARAX/VISTARIL) 25 MG tablet Take 1 tablet (25 mg total) by mouth every 6 (six) hours as needed for anxiety.   LANTUS SOLOSTAR 100 UNIT/ML Solostar Pen Inject 75 Units into the skin at bedtime.   LINZESS 145 MCG CAPS capsule Take 145 mcg by mouth every morning.   losartan-hydrochlorothiazide (HYZAAR) 100-12.5 MG tablet Take 1 tablet by mouth daily.    metFORMIN (GLUCOPHAGE) 1000 MG tablet  Take 1,000 mg by mouth 2 (two) times daily with a meal.   rosuvastatin (CRESTOR) 20 MG tablet Take 20 mg by mouth at bedtime.   Semaglutide, 1 MG/DOSE, 4 MG/3ML SOPN Inject 1 mg as directed once a week for 28 days.   [START ON 11/26/2022] Semaglutide, 2 MG/DOSE, 8 MG/3ML SOPN Inject 2 mg as directed once a week.   tiZANidine (ZANAFLEX) 4 MG tablet Take 4 mg by mouth every 6 (six) hours as needed.   [DISCONTINUED] OZEMPIC, 0.25 OR 0.5 MG/DOSE, 2 MG/3ML SOPN Inject 0.5 mg into the skin once a week.   [DISCONTINUED] ibuprofen (ADVIL) 600 MG tablet Take 1 tablet (600 mg total) by mouth every 8 (eight) hours as needed.   [DISCONTINUED] methocarbamol (ROBAXIN) 500 MG tablet Take 1 tablet (500 mg total) by mouth 4 (four) times daily.   No facility-administered encounter medications on file as of 10/22/2022.    Follow-up: Return in about 3 months (around 01/22/2023) for DM/HTN.   Renee Rival, FNP

## 2022-10-22 NOTE — Assessment & Plan Note (Signed)
Continue Cymbalta 60 mg daily, gabapentin 300 mg twice daily.

## 2022-10-22 NOTE — Assessment & Plan Note (Signed)
Since the past 2 months Due to her history of diabetes, hyperlipidemia tobacco abuse patient referred to vascular specialist to rule out PAD.

## 2022-10-22 NOTE — Patient Instructions (Addendum)
Please get your shingles vaccine and TDAP vaccine at the pharmacy.   Semaglutide, 1 MG/DOSE, 4 MG/3ML SOPN         Inject 1 mg as directed once a week for 28 days., Starting Wed 10/22/2022, Until Wed 11/19/2022, Normal     Semaglutide, 2 MG/DOSE, 8 MG/3ML SOPN        Inject 2 mg as directed once a week., Starting Wed 11/26/2022, Normal     It is important that you exercise regularly at least 30 minutes 5 times a week as tolerated  Think about what you will eat, plan ahead. Choose " clean, green, fresh or frozen" over canned, processed or packaged foods which are more sugary, salty and fatty. 70 to 75% of food eaten should be vegetables and fruit. Three meals at set times with snacks allowed between meals, but they must be fruit or vegetables. Aim to eat over a 12 hour period , example 7 am to 7 pm, and STOP after  your last meal of the day. Drink water,generally about 64 ounces per day, no other drink is as healthy. Fruit juice is best enjoyed in a healthy way, by EATING the fruit.  Thanks for choosing [facility] we consider it a privelige to serve you.

## 2022-10-22 NOTE — Assessment & Plan Note (Signed)
BP Readings from Last 3 Encounters:  10/22/22 108/67  10/12/22 108/75  08/12/22 128/80  Chronic medical condition well-controlled on losartan-hydrochlorothiazide 100-12.5 mg 1 tablet daily Continue current medication DASH diet advised engage in regular moderate to vigorous exercises at least 150 minutes weekly CMP today Follow-up in 3 months

## 2022-10-22 NOTE — Assessment & Plan Note (Addendum)
Smokes about 1 pack/day  Asked about quitting: confirms that he/she currently smokes cigarettes Advise to quit smoking: Educated about QUITTING to reduce the risk of cancer, cardio and cerebrovascular disease. Assess willingness: Unwilling to quit at this time. Assist with counseling and pharmacotherapy: Counseled for 5 minutes and literature provided. Arrange for follow up: follow up in 3 months and continue to offer help.   CT chest 2021 Nodular opacity in the medial aspect of the anterior segment right upper lobe remains stable, measuring 1.5 x 1.3 cm. Note that this lesion did not show significant abnormal radiotracer uptake on prior PET study. A lesion of this nature should be followed serially without change for a minimum of 2 years to confirm benign etiology. A follow-up noncontrast chest CT in 6-9 months in this regard is reasonable. Note that there is no new opacity evident. No airspace consolidation.  Pt decline referral for chest CT scan today

## 2022-10-23 LAB — BASIC METABOLIC PANEL
BUN/Creatinine Ratio: 16 (ref 9–23)
BUN: 13 mg/dL (ref 6–24)
CO2: 29 mmol/L (ref 20–29)
Calcium: 10.4 mg/dL — ABNORMAL HIGH (ref 8.7–10.2)
Chloride: 98 mmol/L (ref 96–106)
Creatinine, Ser: 0.82 mg/dL (ref 0.57–1.00)
Glucose: 247 mg/dL — ABNORMAL HIGH (ref 70–99)
Potassium: 4.2 mmol/L (ref 3.5–5.2)
Sodium: 141 mmol/L (ref 134–144)
eGFR: 84 mL/min/{1.73_m2} (ref >59)

## 2022-10-23 LAB — MICROALBUMIN / CREATININE URINE RATIO
Creatinine, Urine: 240.9 mg/dL
Microalb/Creat Ratio: 9 mg/g creat (ref 0–29)
Microalbumin, Urine: 22.5 ug/mL

## 2022-10-24 NOTE — Progress Notes (Signed)
Normal kidney function   Calcium level is high , she should avoid calcium supplement , recheck labs at next visit.

## 2022-10-29 ENCOUNTER — Ambulatory Visit: Payer: Medicaid Other | Admitting: Nurse Practitioner

## 2022-11-09 ENCOUNTER — Emergency Department (HOSPITAL_BASED_OUTPATIENT_CLINIC_OR_DEPARTMENT_OTHER): Payer: Medicaid Other

## 2022-11-09 ENCOUNTER — Encounter (HOSPITAL_COMMUNITY): Payer: Self-pay

## 2022-11-09 ENCOUNTER — Emergency Department (HOSPITAL_COMMUNITY): Payer: Medicaid Other

## 2022-11-09 ENCOUNTER — Emergency Department (HOSPITAL_COMMUNITY)
Admission: EM | Admit: 2022-11-09 | Discharge: 2022-11-09 | Disposition: A | Discharge status: Home / Self Care | Payer: Medicaid Other | Attending: Emergency Medicine | Admitting: Emergency Medicine

## 2022-11-09 ENCOUNTER — Other Ambulatory Visit: Payer: Self-pay

## 2022-11-09 DIAGNOSIS — M25562 Pain in left knee: Secondary | ICD-10-CM | POA: Diagnosis not present

## 2022-11-09 DIAGNOSIS — Z7982 Long term (current) use of aspirin: Secondary | ICD-10-CM | POA: Diagnosis not present

## 2022-11-09 DIAGNOSIS — Z7984 Long term (current) use of oral hypoglycemic drugs: Secondary | ICD-10-CM | POA: Insufficient documentation

## 2022-11-09 DIAGNOSIS — E1140 Type 2 diabetes mellitus with diabetic neuropathy, unspecified: Secondary | ICD-10-CM | POA: Insufficient documentation

## 2022-11-09 DIAGNOSIS — Z794 Long term (current) use of insulin: Secondary | ICD-10-CM | POA: Insufficient documentation

## 2022-11-09 DIAGNOSIS — M79662 Pain in left lower leg: Secondary | ICD-10-CM | POA: Diagnosis not present

## 2022-11-09 LAB — CBC WITH DIFFERENTIAL/PLATELET
Abs Immature Granulocytes: 0.03 10*3/uL (ref 0.00–0.07)
Basophils Absolute: 0.1 10*3/uL (ref 0.0–0.1)
Basophils Relative: 1 %
Eosinophils Absolute: 0.2 10*3/uL (ref 0.0–0.5)
Eosinophils Relative: 2 %
HCT: 39.7 % (ref 36.0–46.0)
Hemoglobin: 12.8 g/dL (ref 12.0–15.0)
Immature Granulocytes: 0 %
Lymphocytes Relative: 38 %
Lymphs Abs: 3.9 10*3/uL (ref 0.7–4.0)
MCH: 27.4 pg (ref 26.0–34.0)
MCHC: 32.2 g/dL (ref 30.0–36.0)
MCV: 85 fL (ref 80.0–100.0)
Monocytes Absolute: 0.6 10*3/uL (ref 0.1–1.0)
Monocytes Relative: 6 %
Neutro Abs: 5.4 10*3/uL (ref 1.7–7.7)
Neutrophils Relative %: 53 %
Platelets: 415 10*3/uL — ABNORMAL HIGH (ref 150–400)
RBC: 4.67 MIL/uL (ref 3.87–5.11)
RDW: 12.7 % (ref 11.5–15.5)
WBC: 10.2 10*3/uL (ref 4.0–10.5)
nRBC: 0 % (ref 0.0–0.2)

## 2022-11-09 LAB — BASIC METABOLIC PANEL
Anion gap: 11 (ref 5–15)
BUN: 12 mg/dL (ref 6–20)
CO2: 24 mmol/L (ref 22–32)
Calcium: 9.5 mg/dL (ref 8.9–10.3)
Chloride: 104 mmol/L (ref 98–111)
Creatinine, Ser: 0.8 mg/dL (ref 0.44–1.00)
GFR, Estimated: >60 mL/min (ref >60)
Glucose, Bld: 207 mg/dL — ABNORMAL HIGH (ref 70–99)
Potassium: 4.1 mmol/L (ref 3.5–5.1)
Sodium: 139 mmol/L (ref 135–145)

## 2022-11-09 NOTE — ED Provider Triage Note (Signed)
Emergency Medicine Provider Triage Evaluation Note  Ellen Avery , a 56 y.o. female  was evaluated in triage.  Pt complains of left knee pain.  Ongoing for couple weeks.  CT of the ED previously.  States recently seen by PCP.  Has ordered DVT study.  Has not gotten this yet.  Patient states pain is worse when she is attempting to sleep.  Has good range of motion and without tenderness to palpation of the left knee joint.  No prior history of DVT or PE.  Review of Systems  Positive: As above Negative: As above  Physical Exam  BP (!) 129/90 (BP Location: Right Arm)   Pulse 95   Temp 98.3 F (36.8 C)   Resp 18   Ht 5' 5.5" (1.664 m)   Wt 100.7 kg   SpO2 96%   BMI 36.38 kg/m  Gen:   Awake, no distress   Resp:  Normal effort  MSK:   Moves extremities without difficulty  Other:    Medical Decision Making  Medically screening exam initiated at 3:09 AM.  Appropriate orders placed.  MEHEK GREGA was informed that the remainder of the evaluation will be completed by another provider, this initial triage assessment does not replace that evaluation, and the importance of remaining in the ED until their evaluation is complete.     Evlyn Courier, PA-C 11/09/22 [phone]

## 2022-11-09 NOTE — Progress Notes (Signed)
VASCULAR LAB    Left lower extremity venous duplex has been performed.  See CV proc for preliminary results.  Messaged negative results to Dr. Alvino Chapel via secure chat.   Alyanna Stoermer, RVT 11/09/2022, 9:51 AM

## 2022-11-09 NOTE — ED Triage Notes (Signed)
Complains of left knee pain that is worse when trying to sleep.  Reports has been here for the same and had xrays an told it was inflammation.  Patient has not seen orthopedic yet.

## 2022-11-09 NOTE — Discharge Instructions (Addendum)
Thank you for allowing me to be part of your care today.  Your ultrasound was negative for a blood clot in your leg.  Please follow-up with your primary care physician regarding your symptoms.   Return to the ER if you develop and new or worsening symptoms, or have any new concerns.

## 2022-11-09 NOTE — ED Provider Notes (Signed)
[facility] EMERGENCY DEPARTMENT Provider Note   CSN: [id number] Arrival date & time: 11/09/22  0244     History  Chief Complaint  Patient presents with   Knee Pain    Ellen Avery is a 56 y.o. female presents to the ER with complaint of left knee pain that has been going on for "awhile".  She states the pain is worse when she is trying to sleep or when she is walking.  She reports some pain in her right knee as well, but "not nearly as bad" as the left. She describes the pain as a burning pain around the side and front of the knee.  Patient has history significant for fibromyalgia, uncontrolled type 2 diabetes, neuropathy.  Denies fevers, chills, difficulty walking, numbness, or tingling in her lower extremities.  She has not been evaluated by orthopedics, but did see her primary care physician who recommended ruling out a blood clot in her leg.        Home Medications Prior to Admission medications   Medication Sig Start Date End Date Taking? Authorizing Provider  ACCU-CHEK AVIVA PLUS test strip Check blood sugar 3-4 times daily 05/25/18   [provider]  amoxicillin-clavulanate (AUGMENTIN) 875-125 MG tablet Take 1 tablet by mouth 2 (two) times daily. 10/17/22   Mar Daring, PA-C  aspirin EC 81 MG tablet Take 81 mg by mouth daily.    [provider]  Cholecalciferol (VITAMIN D3) 3000 units TABS Take 3,000 Units by mouth daily as needed (vitamin d).    [provider]  DULoxetine (CYMBALTA) 60 MG capsule Take 1 capsule (60 mg total) by mouth daily. 04/03/20   Sharion Balloon, NP  famotidine (PEPCID) 20 MG tablet Take 20 mg by mouth every morning. 12/27/21   [provider]  fenofibrate (TRICOR) 48 MG tablet Take 48 mg by mouth daily. 12/27/21   [provider]  gabapentin (NEURONTIN) 300 MG capsule Take 300 mg by mouth 2 (two) times daily. 12/27/21   [provider]  GNP ULTICARE PEN NEEDLES 32G X 4 MM MISC  USE WITH insulin ONCE DAILY 07/09/22   [provider]  hydrOXYzine (ATARAX/VISTARIL) 25 MG tablet Take 1 tablet (25 mg total) by mouth every 6 (six) hours as needed for anxiety. 09/13/18   [name], [name], MD  LANTUS SOLOSTAR 100 UNIT/ML Solostar Pen Inject 75 Units into the skin at bedtime. 12/12/19   [provider]  LINZESS 145 MCG CAPS capsule Take 145 mcg by mouth every morning. 10/08/22   [provider]  losartan-hydrochlorothiazide (HYZAAR) 100-12.5 MG tablet Take 1 tablet by mouth daily.  11/01/18   [provider]  metFORMIN (GLUCOPHAGE) 1000 MG tablet Take 1,000 mg by mouth 2 (two) times daily with a meal.    [provider]  rosuvastatin (CRESTOR) 20 MG tablet Take 20 mg by mouth at bedtime. 12/27/21   [provider]  Semaglutide, 1 MG/DOSE, 4 MG/3ML SOPN Inject 1 mg as directed once a week for 28 days. 10/22/22 11/19/22  PasedaDewaine Conger, FNP  Semaglutide, 2 MG/DOSE, 8 MG/3ML SOPN Inject 2 mg as directed once a week. 11/26/22   Paseda, Dewaine Conger, FNP  tiZANidine (ZANAFLEX) 4 MG tablet Take 4 mg by mouth every 6 (six) hours as needed. 10/08/22   [provider]      Allergies    Doxycycline and Flagyl [metronidazole]    Review of Systems   Review of Systems  Constitutional:  Negative for fever.  Musculoskeletal:  Positive for arthralgias. Negative for gait problem and joint swelling.       Left knee pain  Neurological:  Negative for weakness and numbness.    Physical Exam Updated Vital Signs BP 135/87 (BP Location: Right Arm)   Pulse 78   Temp 98.7 F (37.1 C)   Resp 18   Ht 5' 5.5" (1.664 m)   Wt 100.7 kg   SpO2 96%   BMI 36.38 kg/m  Physical Exam Vitals and nursing note reviewed.  Constitutional:      General: She is not in acute distress.    Appearance: Normal appearance. She is not ill-appearing or diaphoretic.  Pulmonary:     Effort: Pulmonary effort is normal.  Musculoskeletal:     Right  knee: Normal. No bony tenderness. Normal range of motion. No tenderness. Normal pulse.     Left knee: Normal. No swelling, deformity, erythema, bony tenderness or crepitus. Normal range of motion. No tenderness. Normal pulse.     Comments: No appreciable swelling, erythema, or tenderness to palpation of the left knee.  Patient able to ambulate without difficulty.  Popliteal pulse palpable bilaterally.   Neurological:     Mental Status: She is alert. Mental status is at baseline.  Psychiatric:        Mood and Affect: Mood normal.        Behavior: Behavior normal.     ED Results / Procedures / Treatments   Labs (all labs ordered are listed, but only abnormal results are displayed) Labs Reviewed  CBC WITH DIFFERENTIAL/PLATELET - Abnormal; Notable for the following components:      Result Value   Platelets 415 (*)    All other components within normal limits  BASIC METABOLIC PANEL - Abnormal; Notable for the following components:   Glucose, Bld 207 (*)    All other components within normal limits    EKG None  Radiology VAS [place] LOWER EXTREMITY VENOUS (DVT) (7a-7p)  Result Date: 11/09/2022  Lower Venous DVT Study Patient Name:  Ellen Avery  Date of Exam:   11/09/2022 Medical Rec #: [id number]       Accession #:    [id number] Date of Birth: 06/02/1966       Patient Gender: F Patient Age:   89 years Exam Location:  [facility] Procedure:      VAS [place] LOWER EXTREMITY VENOUS (DVT) Referring Phys: AMJAD ALI --------------------------------------------------------------------------------  Indications: Left lateral knee pain.  Comparison Study: No prior study on file Performing Technologist: Sharion Dove RVS  Examination Guidelines: A complete evaluation includes B-mode imaging, spectral Doppler, color Doppler, and power Doppler as needed of all accessible portions of each vessel. Bilateral testing is considered an integral part of a complete examination. Limited examinations for reoccurring  indications may be performed as noted. The reflux portion of the exam is performed with the patient in reverse Trendelenburg.  +-----+---------------+---------+-----------+----------+--------------+ RIGHTCompressibilityPhasicitySpontaneityPropertiesThrombus Aging +-----+---------------+---------+-----------+----------+--------------+ CFV  Full           Yes      Yes                                 +-----+---------------+---------+-----------+----------+--------------+   +---------+---------------+---------+-----------+----------+--------------+ LEFT     CompressibilityPhasicitySpontaneityPropertiesThrombus Aging +---------+---------------+---------+-----------+----------+--------------+ CFV      Full           Yes      Yes                                 +---------+---------------+---------+-----------+----------+--------------+  SFJ      Full                                                        +---------+---------------+---------+-----------+----------+--------------+ FV Prox  Full                                                        +---------+---------------+---------+-----------+----------+--------------+ FV Mid   Full                                                        +---------+---------------+---------+-----------+----------+--------------+ FV DistalFull                                                        +---------+---------------+---------+-----------+----------+--------------+ PFV      Full                                                        +---------+---------------+---------+-----------+----------+--------------+ POP      Full           Yes      Yes                                 +---------+---------------+---------+-----------+----------+--------------+ PTV      Full                                                        +---------+---------------+---------+-----------+----------+--------------+ PERO     Full                                                         +---------+---------------+---------+-----------+----------+--------------+     Summary: RIGHT: - No evidence of common femoral vein obstruction.  LEFT: - There is no evidence of deep vein thrombosis in the lower extremity.  - No cystic structure found in the popliteal fossa.  *See table(s) above for measurements and observations.    Preliminary    DG Knee Complete 4 Views Left  Result Date: 11/09/2022 CLINICAL DATA:  Chronic left knee pain. EXAM: LEFT KNEE - COMPLETE 4+ VIEW COMPARISON:  August 12, 2022 FINDINGS: No evidence of fracture, dislocation, or joint effusion. No evidence of arthropathy or other focal bone abnormality. Soft tissues are unremarkable. IMPRESSION: Negative. Electronically Signed   By:  Virgina Norfolk M.D.   On: 11/09/2022 03:52    Procedures Procedures    Medications Ordered in ED Medications - No data to display  ED Course/ Medical Decision Making/ A&P                           Medical Decision Making  This patient presents to the ED with chief complaint(s) of left knee pain with pertinent past medical history of type 2 diabetes, fibromyalgia, neuropathy .The complaint involves an extensive differential diagnosis and also carries with it a high risk of complications and morbidity.    The differential diagnosis includes fibromyalgia, neuropathy, restless leg syndrome, PAD, osteoarthritis, rheumatoid arthritis, gout, joint effusion; low suspicion for DVT given patient presentation and duration of symptoms; low suspicion of septic arthritis - patient is afebrile, without erythema, swelling, or increased warmth around the joint  The initial plan is to obtain baseline labs to r/o infectious process and obtain DVT study  Additional history obtained: Records reviewed Primary Care Documents  Initial Assessment:   On exam, patient is resting comfortably in bed, is in no acute distress.  Left knee has no gross  deformities, swelling, erythema, or warmth on exam.  Mild tenderness to palpation of the lateral aspect of the knee.  ROM normal.  Popliteal pulse is palpable.  Negative Homan's sign.  DP pulse palpable.  Right knee also without deformities, swelling, erythema, tenderness, warmth, or decreased ROM.  Sensation and motor function intact in both lower extremities.   Independent ECG/labs interpretation:  The following labs were independently interpreted:  CBC reveals no evidence of leukocytosis, mildly elevated platelets at 415, no anemia.  Metabolic panel significant for elevated glucose at 207, no major electrolyte disturbance, kidney function is normal.  Patient does have poorly controlled type 2 DM.   Independent visualization and interpretation of imaging: I independently visualized the following imaging with scope of interpretation limited to determining acute life threatening conditions related to emergency care: x-ray of left knee, which revealed no evidence of effusion, fracture, or dislocation; no evidence of osteoarthritis or severe degenerative changes.   DVT ultrasound negative.   Treatment and Reassessment: Discussed with patient findings of DVT study and likelihood her pain is related to her neuropathy and/or fibromyalgia.  Patient is already taking prescribed gabapentin and Cymbalta.  I do not feel that any additional treatment is necessary in the ED at this time.  Patient is comfortable and not complaining of pain.   Disposition:   After consideration of the diagnostic results and the patients response to treatment, I feel that emergency department workup does not suggest an emergent condition requiring admission or immediate intervention beyond what has been performed at this time.  The patient is safe for discharge and has been instructed to return immediately for worsening symptoms, change in symptoms or any other concerns.  Patient's "burning" knee pain without evidence of  arthropathy is likely due to chronic underlying condition, suspect neuropathy and/or her fibromyalgia.  Patient may also have restless leg syndrome, given her symptoms are worse at night.  Patient also at high risk of PAD given tobacco use.  Recommend patient follow-up with her primary care physician regarding her symptoms.          Final Clinical Impression(s) / ED Diagnoses Final diagnoses:  Left knee pain, unspecified chronicity    Rx / DC Orders ED Discharge Orders     None  Pat Kocher, [place] 11/09/22 1017    Davonna Belling, MD 11/09/22 1521

## 2022-11-10 ENCOUNTER — Telehealth: Payer: Self-pay

## 2022-11-10 NOTE — Patient Outreach (Signed)
Transition Care Management Unsuccessful Follow-up Telephone Call  Date of discharge and from where:  11/09/22 [facility]  Attempts:  1st Attempt  Reason for unsuccessful TCM follow-up call:  Left voice message   Ellen Avery, BSW, MHA [facility]  [place]  High Risk Managed Medicaid Team  [phone]     

## 2022-11-11 ENCOUNTER — Telehealth: Payer: Self-pay

## 2022-11-11 NOTE — Patient Outreach (Signed)
Transition Care Management Unsuccessful Follow-up Telephone Call  Date of discharge and from where:  11/09/22 [facility]  Attempts:  2nd Attempt  Reason for unsuccessful TCM follow-up call:  Left voice message  Liddy Deam, BSW, MHA [facility]  [place]  High Risk Managed Medicaid Team  [phone]     

## 2022-12-09 ENCOUNTER — Other Ambulatory Visit: Payer: Self-pay | Admitting: Nurse Practitioner

## 2022-12-10 ENCOUNTER — Other Ambulatory Visit: Payer: Self-pay | Admitting: Nurse Practitioner

## 2022-12-10 ENCOUNTER — Telehealth: Payer: Self-pay | Admitting: Nurse Practitioner

## 2022-12-10 MED ORDER — DULOXETINE HCL 60 MG PO CPEP: 60.0000 mg | ORAL_CAPSULE | Freq: Every day | ORAL | 1 refills | Status: DC

## 2022-12-10 NOTE — Telephone Encounter (Signed)
Was routed to the provider. [place]

## 2022-12-10 NOTE — Telephone Encounter (Addendum)
Caller & Relationship to patient:  MRN #  [id number]   Call Back Number:   Date of Last Office Visit: 12/09/2022     Date of Next Office Visit: 01/20/2023    Medication(s) to be Refilled: Cymbalta, anxiety med's  Preferred Pharmacy: [facility]  ** Please notify patient to allow 48-72 hours to process** **Let patient know to contact pharmacy at the end of the day to make sure medication is ready. ** **If patient has not been seen in a year or longer, book an appointment **Advise to use MyChart for refill requests OR to contact their pharmacy

## 2022-12-12 ENCOUNTER — Other Ambulatory Visit: Payer: Self-pay | Admitting: Nurse Practitioner

## 2022-12-12 ENCOUNTER — Other Ambulatory Visit: Payer: Self-pay

## 2022-12-12 ENCOUNTER — Telehealth: Payer: Self-pay | Admitting: Nurse Practitioner

## 2022-12-12 MED ORDER — ACCU-CHEK AVIVA PLUS VI STRP: ORAL_STRIP | 2 refills | Status: DC

## 2022-12-12 MED ORDER — TIZANIDINE HCL 4 MG PO TABS: 4.0000 mg | ORAL_TABLET | Freq: Three times a day (TID) | ORAL | 0 refills | Status: DC | PRN

## 2022-12-12 MED ORDER — GNP ULTICARE PEN NEEDLES 32G X 4 MM MISC: 2 refills | Status: DC

## 2022-12-12 MED ORDER — LOSARTAN POTASSIUM-HCTZ 100-12.5 MG PO TABS: 1.0000 | ORAL_TABLET | Freq: Every day | ORAL | 1 refills | Status: DC

## 2022-12-12 NOTE — Telephone Encounter (Signed)
Caller & Relationship to patient: patient  MRN #  [id number]   Call Back Number: [phone]  Date of Last Office Visit: 12/12/2022     Date of Next Office Visit: 01/20/2023    Medication(s) to be Refilled: losartan  [facility] [address]   ** Please notify patient to allow 48-72 hours to process** **Let patient know to contact pharmacy at the end of the day to make sure medication is ready. ** **If patient has not been seen in a year or longer, book an appointment **Advise to use MyChart for refill requests OR to contact their pharmacy

## 2022-12-12 NOTE — Telephone Encounter (Signed)
From: [name] To: Office of Renee Rival, [place] Sent: 12/12/2022 9:27 AM EST Subject: Medication Renewal Request  Refills have been requested for the following medications:   losartan-hydrochlorothiazide (HYZAAR) 100-12.5 MG tablet  Patient Comment: I use [facility] on [address] please send to them   Preferred pharmacy: [facility] [place] ([place]), [place] - [address] Delivery method: [employer]

## 2022-12-15 NOTE — Telephone Encounter (Signed)
Done 12/12/22. [place]

## 2022-12-18 ENCOUNTER — Telehealth: Payer: Self-pay | Admitting: Nurse Practitioner

## 2022-12-18 NOTE — Telephone Encounter (Signed)
Izora Gala from [facility] LVM on nurse line stating that this patient's referral will be closed out because patient has not responded to requests for scheduling.  [phone]

## 2022-12-24 ENCOUNTER — Emergency Department (HOSPITAL_COMMUNITY)
Admission: EM | Admit: 2022-12-24 | Discharge: 2022-12-25 | Discharge status: Against Medical Advice | Payer: Medicaid Other | Attending: Emergency Medicine | Admitting: Emergency Medicine

## 2022-12-24 ENCOUNTER — Other Ambulatory Visit: Payer: Self-pay

## 2022-12-24 ENCOUNTER — Encounter (HOSPITAL_COMMUNITY): Payer: Self-pay

## 2022-12-24 DIAGNOSIS — R413 Other amnesia: Secondary | ICD-10-CM | POA: Insufficient documentation

## 2022-12-24 DIAGNOSIS — E119 Type 2 diabetes mellitus without complications: Secondary | ICD-10-CM | POA: Insufficient documentation

## 2022-12-24 DIAGNOSIS — G629 Polyneuropathy, unspecified: Secondary | ICD-10-CM | POA: Insufficient documentation

## 2022-12-24 DIAGNOSIS — Z5321 Procedure and treatment not carried out due to patient leaving prior to being seen by health care provider: Secondary | ICD-10-CM | POA: Insufficient documentation

## 2022-12-24 DIAGNOSIS — L299 Pruritus, unspecified: Secondary | ICD-10-CM | POA: Insufficient documentation

## 2022-12-24 LAB — BASIC METABOLIC PANEL
Anion gap: 9 (ref 5–15)
BUN: 11 mg/dL (ref 6–20)
CO2: 25 mmol/L (ref 22–32)
Calcium: 9.6 mg/dL (ref 8.9–10.3)
Chloride: 104 mmol/L (ref 98–111)
Creatinine, Ser: 0.86 mg/dL (ref 0.44–1.00)
GFR, Estimated: >60 mL/min (ref >60)
Glucose, Bld: 142 mg/dL — ABNORMAL HIGH (ref 70–99)
Potassium: 3.8 mmol/L (ref 3.5–5.1)
Sodium: 138 mmol/L (ref 135–145)

## 2022-12-24 LAB — CBC WITH DIFFERENTIAL/PLATELET
Abs Immature Granulocytes: 0.02 10*3/uL (ref 0.00–0.07)
Basophils Absolute: 0 10*3/uL (ref 0.0–0.1)
Basophils Relative: 1 %
Eosinophils Absolute: 0.1 10*3/uL (ref 0.0–0.5)
Eosinophils Relative: 2 %
HCT: 41.9 % (ref 36.0–46.0)
Hemoglobin: 13.3 g/dL (ref 12.0–15.0)
Immature Granulocytes: 0 %
Lymphocytes Relative: 41 %
Lymphs Abs: 3.1 10*3/uL (ref 0.7–4.0)
MCH: 27.7 pg (ref 26.0–34.0)
MCHC: 31.7 g/dL (ref 30.0–36.0)
MCV: 87.1 fL (ref 80.0–100.0)
Monocytes Absolute: 0.3 10*3/uL (ref 0.1–1.0)
Monocytes Relative: 4 %
Neutro Abs: 4.1 10*3/uL (ref 1.7–7.7)
Neutrophils Relative %: 52 %
Platelets: 371 10*3/uL (ref 150–400)
RBC: 4.81 MIL/uL (ref 3.87–5.11)
RDW: 12.6 % (ref 11.5–15.5)
WBC: 7.7 10*3/uL (ref 4.0–10.5)
nRBC: 0 % (ref 0.0–0.2)

## 2022-12-24 LAB — MAGNESIUM: Magnesium: 1.8 mg/dL (ref 1.7–2.4)

## 2022-12-24 NOTE — ED Triage Notes (Signed)
Reports hx of neuropathy b ut not its itching so bad she wants to peel the skin off.  Also reports forgetfulness reports saw his grandson leave for school and what he was wearing but can't remember.  Reports started 3 weeks ago and getting worse.

## 2022-12-24 NOTE — ED Provider Triage Note (Signed)
Emergency Medicine Provider Triage Evaluation Note  Ellen Avery , a 57 y.o. female  was evaluated in triage.  Pt complains of neuropathy and itching that is "so bad that she wants to peel her skin off".  She also reports recent forgetfulness, stating she saw her grandson leave for school and what he was wearing but cannot recall it.  She reports this began 3 weeks ago and has been getting worse.  History significant for DM, fibromyalgia.  Pt is compliant with prescribed medications.   Review of Systems  Positive: As above Negative: As above  Physical Exam  BP 119/73   Pulse 72   Temp 99 F (37.2 C)   Resp 16   Ht 5' 5.5" (1.664 m)   Wt 100.7 kg   SpO2 97%   BMI 36.38 kg/m  Gen:   Awake, no distress   Resp:  Normal effort  MSK:   Moves extremities without difficulty  Other:    Medical Decision Making  Medically screening exam initiated at 2:51 PM.  Appropriate orders placed.  NIMRIT KEHRES was informed that the remainder of the evaluation will be completed by another provider, this initial triage assessment does not replace that evaluation, and the importance of remaining in the ED until their evaluation is complete.     Theressa Stamps R, [place] 12/24/22 [phone]

## 2022-12-25 DIAGNOSIS — M797 Fibromyalgia: Secondary | ICD-10-CM

## 2022-12-25 DIAGNOSIS — M79604 Pain in right leg: Secondary | ICD-10-CM

## 2022-12-25 DIAGNOSIS — G629 Polyneuropathy, unspecified: Secondary | ICD-10-CM

## 2022-12-25 NOTE — ED Notes (Signed)
PT hasn't answered for VS for hours . Previoud shift called PT name multiple times no answer. I TW has called PT name multiple times still no answer.

## 2022-12-26 ENCOUNTER — Other Ambulatory Visit: Payer: Self-pay | Admitting: Nurse Practitioner

## 2022-12-26 DIAGNOSIS — G629 Polyneuropathy, unspecified: Secondary | ICD-10-CM

## 2022-12-26 MED ORDER — ALPHA-LIPOIC ACID 600 MG PO CAPS: 600.0000 mg | ORAL_CAPSULE | Freq: Every day | ORAL | 0 refills | Status: DC

## 2022-12-26 NOTE — Telephone Encounter (Signed)
Doesn't want to see vascular.

## 2022-12-26 NOTE — Telephone Encounter (Signed)
Asking about switching her medications. Please advise

## 2022-12-26 NOTE — Telephone Encounter (Signed)
She's is already on cymbalta and gabapentin. We can try higher dose of gabapentin but she states that she can not tolerate higher dose.Ozempic should not be affecting her memory .  She should follow up with neurology as planned . For her neuropathy We can do a trial of this  nutritional supplement Alpha-lipoic acid   (600 mg daily).she can get this medication OTC.

## 2023-01-20 ENCOUNTER — Ambulatory Visit: Payer: Medicaid Other | Admitting: Nurse Practitioner

## 2023-01-21 ENCOUNTER — Ambulatory Visit (INDEPENDENT_AMBULATORY_CARE_PROVIDER_SITE_OTHER): Payer: Medicaid Other | Admitting: Nurse Practitioner

## 2023-01-21 ENCOUNTER — Encounter: Payer: Self-pay | Admitting: Nurse Practitioner

## 2023-01-21 VITALS — BP 106/67 | HR 75 | Temp 98.4°F | Ht 65.5 in | Wt 220.6 lb

## 2023-01-21 DIAGNOSIS — F419 Anxiety disorder, unspecified: Secondary | ICD-10-CM | POA: Insufficient documentation

## 2023-01-21 DIAGNOSIS — E1165 Type 2 diabetes mellitus with hyperglycemia: Secondary | ICD-10-CM

## 2023-01-21 DIAGNOSIS — E782 Mixed hyperlipidemia: Secondary | ICD-10-CM

## 2023-01-21 DIAGNOSIS — M79604 Pain in right leg: Secondary | ICD-10-CM | POA: Diagnosis not present

## 2023-01-21 DIAGNOSIS — Z720 Tobacco use: Secondary | ICD-10-CM | POA: Diagnosis not present

## 2023-01-21 DIAGNOSIS — F17200 Nicotine dependence, unspecified, uncomplicated: Secondary | ICD-10-CM | POA: Insufficient documentation

## 2023-01-21 DIAGNOSIS — I10 Essential (primary) hypertension: Secondary | ICD-10-CM | POA: Diagnosis not present

## 2023-01-21 DIAGNOSIS — M79605 Pain in left leg: Secondary | ICD-10-CM | POA: Diagnosis not present

## 2023-01-21 LAB — POCT GLYCOSYLATED HEMOGLOBIN (HGB A1C): Hemoglobin A1C: 7.2 % — AB (ref 4.0–5.6)

## 2023-01-21 MED ORDER — GABAPENTIN 300 MG PO CAPS: 300.0000 mg | ORAL_CAPSULE | Freq: Three times a day (TID) | ORAL | 3 refills | Status: DC

## 2023-01-21 MED ORDER — LANTUS SOLOSTAR 100 UNIT/ML ~~LOC~~ SOPN: 75.0000 [IU] | PEN_INJECTOR | Freq: Every day | SUBCUTANEOUS | 3 refills | Status: DC

## 2023-01-21 MED ORDER — HYDROXYZINE HCL 25 MG PO TABS: 25.0000 mg | ORAL_TABLET | Freq: Three times a day (TID) | ORAL | 0 refills | Status: DC | PRN

## 2023-01-21 MED ORDER — SEMAGLUTIDE (1 MG/DOSE) 4 MG/3ML ~~LOC~~ SOPN: 1.0000 mg | PEN_INJECTOR | SUBCUTANEOUS | 2 refills | Status: DC

## 2023-01-21 NOTE — Assessment & Plan Note (Signed)
Hydroxyzine 25 mg 3 times daily refilled She denies SI, HI

## 2023-01-21 NOTE — Assessment & Plan Note (Signed)
Lab Results  Component Value Date   HGBA1C 7.2 (A) 01/21/2023  A1c much improved from8.63 months ago Patient congratulated on her efforts Continue Lantus 75 units daily She agrees to go back on Ozempic but on the lower dose Ozempic 1 mg once weekly injection ordered Avoid fatty fried foods CBG goals discussed with the patient She was counseled on low-carb modified diet Follow-up in 3 months

## 2023-01-21 NOTE — Assessment & Plan Note (Signed)
Start gabapentin 300 mg 3 times daily Takes duloxetine 60 mg daily for depression Follow-up with neurology as planned.

## 2023-01-21 NOTE — Assessment & Plan Note (Addendum)
Smokes about 1 pack/day  Asked about quitting: confirms that he/she currently smokes cigarettes Advise to quit smoking: Educated about QUITTING to reduce the risk of cancer, cardio and cerebrovascular disease. Assess willingness: Unwilling to quit at this time, Not working on cutting back. Assist with counseling and pharmacotherapy: Counseled for 5 minutes and literature provided. Arrange for follow up: follow up in 3 months and continue to offer help.  Chest CT done 3 years ago below, patient continues to refuse CT chest scan to screen for lung cancer MPRESSION: 1. 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 Note that this lesion did not show significant abnormal radiotracer uptake on prior PET study. A lesion of this nature should be followed serially without change for a minimum of 2 years to confirm benign etiology. A follow-up noncontrast chest CT in 6-9 months in this regard is reasonable. Note that there is no new opacity evident. No airspace consolidation.

## 2023-01-21 NOTE — Assessment & Plan Note (Signed)
BP Readings from Last 3 Encounters:  01/21/23 106/67  12/24/22 119/73  11/09/22 (!) 134/91  Currently well-controlled on losartan-hydrochlorothiazide 100-12.5 mg daily/ Continue current medication DASH diet advised Engage in moderate to vigorous exercise at least 150 minutes weekly. Follow-up in 3 months

## 2023-01-21 NOTE — Assessment & Plan Note (Signed)
Currently on fenofibrate 48 mg daily, rosuvastatin 20 mg daily Check lipid panel LDL goal is less than 70

## 2023-01-21 NOTE — Patient Instructions (Addendum)
   Goal for fasting blood sugar ranges from 80 to 120 and 2 hours after any meal or at bedtime should be between 130 to 170.  Uncontrolled type 2 diabetes mellitus with hyperglycemia (HCC)  - POCT glycosylated hemoglobin (Hb A1C) - Semaglutide, 1 MG/DOSE, 4 MG/3ML SOPN; Inject 1 mg as directed once a week.  Dispense: 3 mL; Refill: 2 - LANTUS SOLOSTAR 100 UNIT/ML Solostar Pen; Inject 75 Units into the skin at bedtime.  Dispense: 15 mL; Refill: 3  . Leg pain, bilateral  - gabapentin (NEURONTIN) 300 MG capsule; Take 1 capsule (300 mg total) by mouth 3 (three) times daily.  Dispense: 90 capsule; Refill: 3  . Anxiety  - hydrOXYzine (ATARAX) 25 MG tablet; Take 1 tablet (25 mg total) by mouth every 8 (eight) hours as needed for anxiety.  Dispense: 90 tablet; Refill: 0     It is important that you exercise regularly at least 30 minutes 5 times a week as tolerated  Think about what you will eat, plan ahead. Choose " clean, green, fresh or frozen" over canned, processed or packaged foods which are more sugary, salty and fatty. 70 to 75% of food eaten should be vegetables and fruit. Three meals at set times with snacks allowed between meals, but they must be fruit or vegetables. Aim to eat over a 12 hour period , example 7 am to 7 pm, and STOP after  your last meal of the day. Drink water,generally about 64 ounces per day, no other drink is as healthy. Fruit juice is best enjoyed in a healthy way, by EATING the fruit.  Thanks for choosing [facility] we consider it a privelige to serve you.

## 2023-01-21 NOTE — Progress Notes (Signed)
Established Patient Office Visit  Subjective:  Patient ID: Ellen Avery, female    DOB: 04-27-1966  Age: 57 y.o. MRN: [id number]  CC:  Chief Complaint  Patient presents with   Follow-up    3 month follow up for diabetes / htn , pt is fasting for labs, pt is checking once day cbg running 114 range , discuss going back omezpic,pt isn't not checking b/p ,pt as not miss any dosage ,    HPI Ellen Avery is a 57 y.o. female with past medical history of uncontrolled type 2 diabetes, hypertension, hyperlipidemia, tobacco abuse, major depression, fibromyalgia, anxiety who presents for follow-up for her chronic medical conditions.  Uncontrolled type 2 diabetes.  Currently on Lantus 75 units daily she was on Ozempic 2 mg once weekly injection she stopped taking Ozempic about 2 weeks ago due to GI upset.  She reported that she did well on Ozempic 1 mg dose.  No complaints of polyphagia, polyuria, polydipsia.   Hypertension.  Currently on losartan-hydrochlorothiazide 100-12.5 mg 1 tablet daily.  Patient did not chest pain, dizziness, edema.   She continues to have bilateral leg pain she was referred to vascular but she declined referral saying that she had an imaging study done in the emergency room to evaluate for PAD.  She has upcoming appointment with neurology currently taking gabapentin 300 mg twice daily she would like to increase it to gabapentin 300 mg 3 times daily.  I discussed with the patient that I do not see an ABI testing done at the emergency room, she was encouraged to follow-up with neurology as planned.  She is also taking duloxetine 60 mg for depression.  She continues to smoke cigarettes 1 pack daily need to quit smoking discussed.      Past Medical History:  Diagnosis Date   Anxiety    Arthritis    Chronic headaches    DDD (degenerative disc disease), cervical    Fibromyalgia    GERD (gastroesophageal reflux disease)    History of colon polyps    History of  psychosis    Hypercholesteremia    Hypertension    IBS (irritable bowel syndrome)    IBS (irritable bowel syndrome)    MDD (major depressive disorder)    Right sided facial pain    Seasonal allergic rhinitis    Sinus congestion    12-24-2018 per pt no fever   Type 2 diabetes mellitus treated with insulin ([place])    Vitamin D deficiency    Wears dentures    full upper,  partial lower   Wears glasses     Past Surgical History:  Procedure Laterality Date   ARTERY BIOPSY Right 12/28/2018   Procedure: RIGHT BIOPSY TEMPORAL ARTERY;  Surgeon: Kinsinger, Arta Bruce, MD;  Location: [facility];  Service: General;  Laterality: Right;   CARDIAC CATHETERIZATION     MULTIPLE TOOTH EXTRACTIONS  04/ 2019    sedation   TOTAL ABDOMINAL HYSTERECTOMY W/ BILATERAL SALPINGOOPHORECTOMY  2009    Family History  Problem Relation Age of Onset   Diabetes Mother    Heart disease Mother    High blood pressure Mother    Hyperlipidemia Mother    Diabetes Mellitus II Father    Heart Problems Brother    Breast cancer Maternal Aunt    Cervical cancer Paternal Aunt    Diabetes Paternal Uncle    Heart disease Paternal Grandmother    Breast cancer Paternal Grandmother    Head &  neck cancer Cousin    Colon cancer Neg Hx    Colon polyps Neg Hx    Esophageal cancer Neg Hx    Rectal cancer Neg Hx    Stomach cancer Neg Hx     Social History   Socioeconomic History   Marital status: Divorced    Spouse name: Not on file   Number of children: 2   Years of education: Not on file   Highest education level: Not on file  Occupational History   Not on file  Tobacco Use   Smoking status: Every Day    Packs/day: 1.00    Years: 35.00    Total pack years: 35.00    Types: Cigarettes   Smokeless tobacco: Never  Vaping Use   Vaping Use: Never used  Substance and Sexual Activity   Alcohol use: No   Drug use: No   Sexual activity: Not Currently    Birth control/protection: Surgical  Other  Topics Concern   Not on file  Social History Narrative   Lives home alone    Social Determinants of Health   Financial Resource Strain: Not on file  Food Insecurity: Not on file  Transportation Needs: Not on file  Physical Activity: Not on file  Stress: Not on file  Social Connections: Not on file  Intimate Partner Violence: Not on file    Outpatient Medications Prior to Visit  Medication Sig Dispense Refill   aspirin EC 81 MG tablet Take 81 mg by mouth daily.     Cholecalciferol (VITAMIN D3) 3000 units TABS Take 3,000 Units by mouth daily as needed (vitamin d).     DULoxetine (CYMBALTA) 60 MG capsule Take 1 capsule (60 mg total) by mouth daily. 90 capsule 1   famotidine (PEPCID) 20 MG tablet Take 20 mg by mouth every morning.     fenofibrate (TRICOR) 48 MG tablet Take 48 mg by mouth daily.     losartan-hydrochlorothiazide (HYZAAR) 100-12.5 MG tablet Take 1 tablet by mouth daily. 90 tablet 1   rosuvastatin (CRESTOR) 20 MG tablet Take 20 mg by mouth at bedtime.     tiZANidine (ZANAFLEX) 4 MG tablet Take 1 tablet (4 mg total) by mouth every 8 (eight) hours as needed. 30 tablet 0   gabapentin (NEURONTIN) 300 MG capsule Take 300 mg by mouth 2 (two) times daily.     hydrOXYzine (ATARAX/VISTARIL) 25 MG tablet Take 1 tablet (25 mg total) by mouth every 6 (six) hours as needed for anxiety. 20 tablet 0   LANTUS SOLOSTAR 100 UNIT/ML Solostar Pen Inject 75 Units into the skin at bedtime.     ACCU-CHEK AVIVA PLUS test strip Check blood sugar 3-4 times daily 300 each 2   Alpha-Lipoic Acid 600 MG CAPS Take 1 capsule (600 mg total) by mouth daily. 90 capsule 0   GNP ULTICARE PEN NEEDLES 32G X 4 MM MISC USE WITH insulin ONCE DAILY 100 each 2   LINZESS 145 MCG CAPS capsule Take 145 mcg by mouth every morning. (Patient not taking: Reported on 01/21/2023)     metFORMIN (GLUCOPHAGE) 1000 MG tablet Take 1,000 mg by mouth 2 (two) times daily with a meal.     amoxicillin-clavulanate (AUGMENTIN) 875-125 MG  tablet Take 1 tablet by mouth 2 (two) times daily. 20 tablet 0   Semaglutide, 2 MG/DOSE, 8 MG/3ML SOPN Inject 2 mg as directed once a week. 3 mL 0   No facility-administered medications prior to visit.    Allergies  Allergen Reactions  Doxycycline Hives and Itching   Flagyl [Metronidazole] Hives and Itching    Benadryl     ROS Review of Systems  Constitutional:  Negative for activity change, appetite change, chills, diaphoresis, fatigue and fever.  Respiratory:  Negative for apnea, cough, choking, chest tightness, shortness of breath, wheezing and stridor.   Cardiovascular: Negative.  Negative for chest pain, palpitations and leg swelling.  Endocrine: Negative for polydipsia, polyphagia and polyuria.  Musculoskeletal:  Positive for arthralgias. Negative for back pain, gait problem and joint swelling.  Neurological:  Negative for dizziness, tremors, seizures, facial asymmetry, weakness, light-headedness and headaches.  Psychiatric/Behavioral: Negative.  Negative for agitation, behavioral problems, confusion and decreased concentration.       Objective:    Physical Exam Constitutional:      General: She is not in acute distress.    Appearance: Normal appearance. She is obese. She is not ill-appearing, toxic-appearing or diaphoretic.  Eyes:     General: No scleral icterus.       Right eye: No discharge.        Left eye: No discharge.     Extraocular Movements: Extraocular movements intact.  Cardiovascular:     Rate and Rhythm: Normal rate and regular rhythm.     Pulses: Normal pulses.     Heart sounds: Normal heart sounds. No murmur heard.    No friction rub. No gallop.  Pulmonary:     Effort: Pulmonary effort is normal. No respiratory distress.     Breath sounds: Normal breath sounds. No stridor. No wheezing, rhonchi or rales.  Chest:     Chest wall: No tenderness.  Abdominal:     General: There is no distension.     Palpations: Abdomen is soft.     Tenderness: There  is no abdominal tenderness. There is no guarding.  Musculoskeletal:        General: No deformity or signs of injury.     Right lower leg: No edema.     Left lower leg: No edema.  Skin:    General: Skin is warm and dry.     Coloration: Skin is not jaundiced or pale.     Findings: No bruising or erythema.  Neurological:     Mental Status: She is alert and oriented to person, place, and time.     Motor: No weakness.     Coordination: Coordination normal.     Gait: Gait normal.  Psychiatric:        Mood and Affect: Mood normal.        Behavior: Behavior normal.        Thought Content: Thought content normal.        Judgment: Judgment normal.     BP 106/67   Pulse 75   Temp 98.4 F (36.9 C)   Ht 5' 5.5" (1.664 m)   Wt 220 lb 9.6 oz (100.1 kg)   SpO2 96%   BMI 36.15 kg/m  Wt Readings from Last 3 Encounters:  01/21/23 220 lb 9.6 oz (100.1 kg)  12/24/22 222 lb (100.7 kg)  11/09/22 222 lb (100.7 kg)    Lab Results  Component Value Date   TSH 0.57 03/19/2022   Lab Results  Component Value Date   WBC 7.7 12/24/2022   HGB 13.3 12/24/2022   HCT 41.9 12/24/2022   MCV 87.1 12/24/2022   PLT 371 12/24/2022   Lab Results  Component Value Date   NA 138 12/24/2022   K 3.8 12/24/2022   CO2 25  12/24/2022   GLUCOSE 142 (H) 12/24/2022   BUN 11 12/24/2022   CREATININE 0.86 12/24/2022   BILITOT 0.3 05/13/2022   ALKPHOS 70 05/13/2022   AST 14 (L) 05/13/2022   ALT 14 05/13/2022   PROT 6.6 05/13/2022   ALBUMIN 3.5 05/13/2022   CALCIUM 9.6 12/24/2022   ANIONGAP 9 12/24/2022   EGFR 84 10/22/2022   Lab Results  Component Value Date   CHOL 149 03/19/2022   Lab Results  Component Value Date   HDL 58 03/19/2022   Lab Results  Component Value Date   LDLCALC 70 03/19/2022   Lab Results  Component Value Date   TRIG 128 03/19/2022   Lab Results  Component Value Date   CHOLHDL 2.6 03/19/2022   Lab Results  Component Value Date   HGBA1C 7.2 (A) 01/21/2023       Assessment & Plan:   Problem List Items Addressed This Visit       Cardiovascular and Mediastinum   HTN (hypertension)    BP Readings from Last 3 Encounters:  01/21/23 106/67  12/24/22 119/73  11/09/22 (!) 134/91  Currently well-controlled on losartan-hydrochlorothiazide 100-12.5 mg daily/ Continue current medication DASH diet advised Engage in moderate to vigorous exercise at least 150 minutes weekly. Follow-up in 3 months        Endocrine   Uncontrolled type 2 diabetes mellitus with hyperglycemia ([place])    Lab Results  Component Value Date   HGBA1C 7.2 (A) 01/21/2023  A1c much improved from8.63 months ago Patient congratulated on her efforts Continue Lantus 75 units daily She agrees to go back on Ozempic but on the lower dose Ozempic 1 mg once weekly injection ordered Avoid fatty fried foods CBG goals discussed with the patient She was counseled on low-carb modified diet Follow-up in 3 months      Relevant Medications   Semaglutide, 1 MG/DOSE, 4 MG/3ML SOPN   LANTUS SOLOSTAR 100 UNIT/ML Solostar Pen   Other Relevant Orders   POCT glycosylated hemoglobin (Hb A1C) (Completed)     Other   HLD (hyperlipidemia) - Primary    Currently on fenofibrate 48 mg daily, rosuvastatin 20 mg daily Check lipid panel LDL goal is less than 70      Relevant Orders   Lipid Panel   Leg pain, bilateral    Start gabapentin 300 mg 3 times daily Takes duloxetine 60 mg daily for depression Follow-up with neurology as planned.      Relevant Medications   gabapentin (NEURONTIN) 300 MG capsule   Tobacco abuse    Smokes about 1 pack/day  Asked about quitting: confirms that he/she currently smokes cigarettes Advise to quit smoking: Educated about QUITTING to reduce the risk of cancer, cardio and cerebrovascular disease. Assess willingness: Unwilling to quit at this time, Not working on cutting back. Assist with counseling and pharmacotherapy: Counseled for 5 minutes and literature  provided. Arrange for follow up: follow up in 3 months and continue to offer help.  Chest CT done 3 years ago below, patient continues to refuse CT chest scan to screen for lung cancer MPRESSION: 1. 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 Note that this lesion did not show significant abnormal radiotracer uptake on prior PET study. A lesion of this nature should be followed serially without change for a minimum of 2 years to confirm benign etiology. A follow-up noncontrast chest CT in 6-9 months in this regard is reasonable.  Note that there is no new opacity evident. No airspace consolidation.      Anxiety    Hydroxyzine 25 mg 3 times daily refilled She denies SI, HI      Relevant Medications   hydrOXYzine (ATARAX) 25 MG tablet    Meds ordered this encounter  Medications   Semaglutide, 1 MG/DOSE, 4 MG/3ML SOPN    Sig: Inject 1 mg as directed once a week.    Dispense:  3 mL    Refill:  2   gabapentin (NEURONTIN) 300 MG capsule    Sig: Take 1 capsule (300 mg total) by mouth 3 (three) times daily.    Dispense:  90 capsule    Refill:  3   LANTUS SOLOSTAR 100 UNIT/ML Solostar Pen    Sig: Inject 75 Units into the skin at bedtime.    Dispense:  15 mL    Refill:  3   hydrOXYzine (ATARAX) 25 MG tablet    Sig: Take 1 tablet (25 mg total) by mouth every 8 (eight) hours as needed for anxiety.    Dispense:  90 tablet    Refill:  0    Follow-up: Return in about 14 weeks (around 04/29/2023) for cpe/PAP.    Renee Rival, FNP

## 2023-01-22 ENCOUNTER — Other Ambulatory Visit: Payer: Self-pay | Admitting: Nurse Practitioner

## 2023-01-22 LAB — LIPID PANEL
Chol/HDL Ratio: 3 ratio (ref 0.0–4.4)
Cholesterol, Total: 166 mg/dL (ref 100–199)
HDL: 56 mg/dL (ref >39)
LDL Chol Calc (NIH): 90 mg/dL (ref 0–99)
Triglycerides: 110 mg/dL (ref 0–149)
VLDL Cholesterol Cal: 20 mg/dL (ref 5–40)

## 2023-01-22 MED ORDER — ROSUVASTATIN CALCIUM 40 MG PO TABS: 40.0000 mg | ORAL_TABLET | Freq: Every day | ORAL | 1 refills | Status: DC

## 2023-01-23 ENCOUNTER — Other Ambulatory Visit: Payer: Self-pay | Admitting: Internal Medicine

## 2023-01-23 DIAGNOSIS — Z1231 Encounter for screening mammogram for malignant neoplasm of breast: Secondary | ICD-10-CM

## 2023-01-29 ENCOUNTER — Telehealth: Payer: Medicaid Other | Admitting: Physician Assistant

## 2023-01-29 DIAGNOSIS — J0110 Acute frontal sinusitis, unspecified: Secondary | ICD-10-CM

## 2023-01-29 MED ORDER — AMOXICILLIN-POT CLAVULANATE 875-125 MG PO TABS: 1.0000 | ORAL_TABLET | Freq: Two times a day (BID) | ORAL | 0 refills | Status: DC

## 2023-01-29 NOTE — Patient Instructions (Signed)
[name], thank you for joining [name], PA-C for today's virtual visit.  While this provider is not your primary care provider (PCP), if your PCP is located in our provider database this encounter information will be shared with them immediately following your visit.   [facility] account gives you access to today's visit and all your visits, tests, and labs performed at [facility] " click here if you don't have a [facility] account or go to [email or web address]  Consent: (Patient) Ellen Avery provided verbal consent for this virtual visit at the beginning of the encounter.  Current Medications:  Current Outpatient Medications:    rosuvastatin (CRESTOR) 40 MG tablet, Take 1 tablet (40 mg total) by mouth daily., Disp: 90 tablet, Rfl: 1   ACCU-CHEK AVIVA PLUS test strip, Check blood sugar 3-4 times daily, Disp: 300 each, Rfl: 2   aspirin EC 81 MG tablet, Take 81 mg by mouth daily., Disp: , Rfl:    Cholecalciferol (VITAMIN D3) 3000 units TABS, Take 3,000 Units by mouth daily as needed (vitamin d)., Disp: , Rfl:    DULoxetine (CYMBALTA) 60 MG capsule, Take 1 capsule (60 mg total) by mouth daily., Disp: 90 capsule, Rfl: 1   famotidine (PEPCID) 20 MG tablet, Take 20 mg by mouth every morning., Disp: , Rfl:    fenofibrate (TRICOR) 48 MG tablet, Take 48 mg by mouth daily., Disp: , Rfl:    gabapentin (NEURONTIN) 300 MG capsule, Take 1 capsule (300 mg total) by mouth 3 (three) times daily., Disp: 90 capsule, Rfl: 3   GNP ULTICARE PEN NEEDLES 32G X 4 MM MISC, USE WITH insulin ONCE DAILY, Disp: 100 each, Rfl: 2   hydrOXYzine (ATARAX) 25 MG tablet, Take 1 tablet (25 mg total) by mouth every 8 (eight) hours as needed for anxiety., Disp: 90 tablet, Rfl: 0   LANTUS SOLOSTAR 100 UNIT/ML Solostar Pen, Inject 75 Units into the skin at bedtime., Disp: 15 mL, Rfl: 3   LINZESS 145 MCG CAPS capsule, Take 145 mcg by mouth every morning. (Patient not taking:  Reported on 01/21/2023), Disp: , Rfl:    losartan-hydrochlorothiazide (HYZAAR) 100-12.5 MG tablet, Take 1 tablet by mouth daily., Disp: 90 tablet, Rfl: 1   Semaglutide, 1 MG/DOSE, 4 MG/3ML SOPN, Inject 1 mg as directed once a week., Disp: 3 mL, Rfl: 2   tiZANidine (ZANAFLEX) 4 MG tablet, Take 1 tablet (4 mg total) by mouth every 8 (eight) hours as needed., Disp: 30 tablet, Rfl: 0   Medications ordered in this encounter:  No orders of the defined types were placed in this encounter.    *If you need refills on other medications prior to your next appointment, please contact your pharmacy*  Follow-Up: Call back or seek an in-person evaluation if the symptoms worsen or if the condition fails to improve as anticipated.  [facility] [phone]  Other Instructions Please take antibiotic as directed.  Increase fluid intake.  Use Saline nasal spray.  Take a daily multivitamin. Continue your allergy medications. Ok to continue Mucinex OTC as well.  Place a humidifier in the bedroom.  Please call or return clinic if symptoms are not improving.  Sinusitis Sinusitis is redness, soreness, and swelling (inflammation) of the paranasal sinuses. Paranasal sinuses are air pockets within the bones of your face (beneath the eyes, the middle of the forehead, or above the eyes). In healthy paranasal sinuses, mucus is able to drain out, and air is able  to circulate through them by way of your nose. However, when your paranasal sinuses are inflamed, mucus and air can become trapped. This can allow bacteria and other germs to grow and cause infection. Sinusitis can develop quickly and last only a short time (acute) or continue over a long period (chronic). Sinusitis that lasts for more than 12 weeks is considered chronic.  CAUSES  Causes of sinusitis include: Allergies. Structural abnormalities, such as displacement of the cartilage that separates your nostrils (deviated septum), which can decrease  the air flow through your nose and sinuses and affect sinus drainage. Functional abnormalities, such as when the small hairs (cilia) that line your sinuses and help remove mucus do not work properly or are not present. SYMPTOMS  Symptoms of acute and chronic sinusitis are the same. The primary symptoms are pain and pressure around the affected sinuses. Other symptoms include: Upper toothache. Earache. Headache. Bad breath. Decreased sense of smell and taste. A cough, which worsens when you are lying flat. Fatigue. Fever. Thick drainage from your nose, which often is green and may contain pus (purulent). Swelling and warmth over the affected sinuses. DIAGNOSIS  Your caregiver will perform a physical exam. During the exam, your caregiver may: Look in your nose for signs of abnormal growths in your nostrils (nasal polyps). Tap over the affected sinus to check for signs of infection. View the inside of your sinuses (endoscopy) with a special imaging device with a light attached (endoscope), which is inserted into your sinuses. If your caregiver suspects that you have chronic sinusitis, one or more of the following tests may be recommended: Allergy tests. Nasal culture A sample of mucus is taken from your nose and sent to a lab and screened for bacteria. Nasal cytology A sample of mucus is taken from your nose and examined by your caregiver to determine if your sinusitis is related to an allergy. TREATMENT  Most cases of acute sinusitis are related to a viral infection and will resolve on their own within 10 days. Sometimes medicines are prescribed to help relieve symptoms (pain medicine, decongestants, nasal steroid sprays, or saline sprays).  However, for sinusitis related to a bacterial infection, your caregiver will prescribe antibiotic medicines. These are medicines that will help kill the bacteria causing the infection.  Rarely, sinusitis is caused by a fungal infection. In theses cases,  your caregiver will prescribe antifungal medicine. For some cases of chronic sinusitis, surgery is needed. Generally, these are cases in which sinusitis recurs more than 3 times per year, despite other treatments. HOME CARE INSTRUCTIONS  Drink plenty of water. Water helps thin the mucus so your sinuses can drain more easily. Use a humidifier. Inhale steam 3 to 4 times a day (for example, sit in the bathroom with the shower running). Apply a warm, moist washcloth to your face 3 to 4 times a day, or as directed by your caregiver. Use saline nasal sprays to help moisten and clean your sinuses. Take over-the-counter or prescription medicines for pain, discomfort, or fever only as directed by your caregiver. SEEK IMMEDIATE MEDICAL CARE IF: You have increasing pain or severe headaches. You have nausea, vomiting, or drowsiness. You have swelling around your face. You have vision problems. You have a stiff neck. You have difficulty breathing. MAKE SURE YOU:  Understand these instructions. Will watch your condition. Will get help right away if you are not doing well or get worse. Document Released: 11/24/2005 Document Revised: 02/16/2012 Document Reviewed: 12/09/2011 ExitCare Patient Information 2014 [employer].    If you have been instructed to have an in-person evaluation today at a local Urgent Care facility, please use the link below. It will take you to a list of all of our available [facility], including address, phone number and hours of operation. Please do not delay care.  [facility]  If you or a family member do not have a primary care provider, use the link below to schedule a visit and establish care. When you choose a [facility] primary care physician or advanced practice provider, you gain a long-term partner in health. Find a Primary Care Provider  Learn more about [facility]'s in-office and virtual care options: [facility]

## 2023-01-29 NOTE — Progress Notes (Signed)
Virtual Visit Consent   Ellen Avery, you are scheduled for a virtual visit with a [facility] provider today. Just as with appointments in the office, your consent must be obtained to participate. Your consent will be active for this visit and any virtual visit you may have with one of our providers in the next 365 days. If you have a MyChart account, a copy of this consent can be sent to you electronically.  As this is a virtual visit, video technology does not allow for your provider to perform a traditional examination. This may limit your provider's ability to fully assess your condition. If your provider identifies any concerns that need to be evaluated in person or the need to arrange testing (such as labs, EKG, etc.), we will make arrangements to do so. Although advances in technology are sophisticated, we cannot ensure that it will always work on either your end or our end. If the connection with a video visit is poor, the visit may have to be switched to a telephone visit. With either a video or telephone visit, we are not always able to ensure that we have a secure connection.  By engaging in this virtual visit, you consent to the provision of healthcare and authorize for your insurance to be billed (if applicable) for the services provided during this visit. Depending on your insurance coverage, you may receive a charge related to this service.  I need to obtain your verbal consent now. Are you willing to proceed with your visit today? Ellen Avery has provided verbal consent on 01/29/2023 for a virtual visit (video or telephone). [name], [place]  Date: 01/29/2023 11:35 AM  Virtual Visit via Video Note   I, [name], connected with  Ellen Avery  ([id number], 08-07-1966) on 01/29/23 at 12:00 PM EST by a video-enabled telemedicine application and verified that I am speaking with the correct person using two identifiers.  Location: Patient: Virtual Visit Location  Patient: Home Provider: Virtual Visit Location Provider: Home Office   I discussed the limitations of evaluation and management by telemedicine and the availability of in person appointments. The patient expressed understanding and agreed to proceed.    History of Present Illness: Ellen Avery is a 57 y.o. who identifies as a female who was assigned female at birth, and is being seen today for several days of nasal congestion, sinus pressure and headache with sinus pain. Denies fevers, chills, aches. Notes some fatigue. Some mild cough. Notes facial pain without tooth pain. Denies recent travel or sick contact.Evelina Bucy an at-home COVID test yesterday which was negative.   OTC -- Mucinex  HPI: HPI  Problems:  Patient Active Problem List   Diagnosis Date Noted   Tobacco abuse 01/21/2023   Anxiety 01/21/2023   Uncontrolled type 2 diabetes mellitus with hyperglycemia ([place]) 10/22/2022   Tobacco abuse counseling 10/22/2022   Leg pain, bilateral 10/22/2022   Fibromyalgia 10/22/2022   Chest pain in adult 07/20/2019   IDDM (insulin dependent diabetes mellitus) 07/08/2018   HTN (hypertension) 07/08/2018   HLD (hyperlipidemia) 07/08/2018   Altered mental status 03/02/2016   MDD (major depressive disorder), recurrent episode, moderate ([place]) 03/02/2016   Psychoses ([place])     Allergies:  Allergies  Allergen Reactions   Doxycycline Hives and Itching   Flagyl [Metronidazole] Hives and Itching    Benadryl    Medications:  Current Outpatient Medications:    amoxicillin-clavulanate (AUGMENTIN) 875-125 MG tablet, Take 1 tablet by mouth 2 (  two) times daily., Disp: 14 tablet, Rfl: 0   rosuvastatin (CRESTOR) 40 MG tablet, Take 1 tablet (40 mg total) by mouth daily., Disp: 90 tablet, Rfl: 1   ACCU-CHEK AVIVA PLUS test strip, Check blood sugar 3-4 times daily, Disp: 300 each, Rfl: 2   aspirin EC 81 MG tablet, Take 81 mg by mouth daily., Disp: , Rfl:    Cholecalciferol (VITAMIN D3) 3000 units TABS, Take  3,000 Units by mouth daily as needed (vitamin d)., Disp: , Rfl:    DULoxetine (CYMBALTA) 60 MG capsule, Take 1 capsule (60 mg total) by mouth daily., Disp: 90 capsule, Rfl: 1   famotidine (PEPCID) 20 MG tablet, Take 20 mg by mouth every morning., Disp: , Rfl:    fenofibrate (TRICOR) 48 MG tablet, Take 48 mg by mouth daily., Disp: , Rfl:    gabapentin (NEURONTIN) 300 MG capsule, Take 1 capsule (300 mg total) by mouth 3 (three) times daily., Disp: 90 capsule, Rfl: 3   GNP ULTICARE PEN NEEDLES 32G X 4 MM MISC, USE WITH insulin ONCE DAILY, Disp: 100 each, Rfl: 2   hydrOXYzine (ATARAX) 25 MG tablet, Take 1 tablet (25 mg total) by mouth every 8 (eight) hours as needed for anxiety., Disp: 90 tablet, Rfl: 0   LANTUS SOLOSTAR 100 UNIT/ML Solostar Pen, Inject 75 Units into the skin at bedtime., Disp: 15 mL, Rfl: 3   LINZESS 145 MCG CAPS capsule, Take 145 mcg by mouth every morning. (Patient not taking: Reported on 01/21/2023), Disp: , Rfl:    losartan-hydrochlorothiazide (HYZAAR) 100-12.5 MG tablet, Take 1 tablet by mouth daily., Disp: 90 tablet, Rfl: 1   Semaglutide, 1 MG/DOSE, 4 MG/3ML SOPN, Inject 1 mg as directed once a week., Disp: 3 mL, Rfl: 2   tiZANidine (ZANAFLEX) 4 MG tablet, Take 1 tablet (4 mg total) by mouth every 8 (eight) hours as needed., Disp: 30 tablet, Rfl: 0  Observations/Objective: Patient is well-developed, well-nourished in no acute distress.  Resting comfortably at home.  Head is normocephalic, atraumatic.  No labored breathing. Speech is clear and coherent with logical content.  Patient is alert and oriented at baseline.   Assessment and Plan: 1. Acute non-recurrent frontal sinusitis - amoxicillin-clavulanate (AUGMENTIN) 875-125 MG tablet; Take 1 tablet by mouth 2 (two) times daily.  Dispense: 14 tablet; Refill: 0  Rx Augmentin.  Increase fluids.  Rest.  Saline nasal spray.  Probiotic.  Mucinex as directed.  Humidifier in bedroom. Ok to continue allergy medications.  Call or  return to clinic if symptoms are not improving.   Follow Up Instructions: I discussed the assessment and treatment plan with the patient. The patient was provided an opportunity to ask questions and all were answered. The patient agreed with the plan and demonstrated an understanding of the instructions.  A copy of instructions were sent to the patient via MyChart unless otherwise noted below.   The patient was advised to call back or seek an in-person evaluation if the symptoms worsen or if the condition fails to improve as anticipated.  Time:  I spent 10 minutes with the patient via telehealth technology discussing the above problems/concerns.    [name], PA-C

## 2023-01-30 ENCOUNTER — Other Ambulatory Visit: Payer: Self-pay | Admitting: Nurse Practitioner

## 2023-01-30 DIAGNOSIS — E1165 Type 2 diabetes mellitus with hyperglycemia: Secondary | ICD-10-CM

## 2023-01-30 MED ORDER — LANTUS SOLOSTAR 100 UNIT/ML ~~LOC~~ SOPN: 60.0000 [IU] | PEN_INJECTOR | Freq: Every day | SUBCUTANEOUS | 3 refills | Status: DC

## 2023-01-30 NOTE — Telephone Encounter (Signed)
Pt was advised by phone [facility]

## 2023-02-03 ENCOUNTER — Encounter: Payer: Self-pay | Admitting: Neurology

## 2023-02-03 ENCOUNTER — Ambulatory Visit (INDEPENDENT_AMBULATORY_CARE_PROVIDER_SITE_OTHER): Payer: Medicaid Other | Admitting: Neurology

## 2023-02-03 VITALS — BP 119/74 | HR 70 | Ht 65.0 in | Wt 220.0 lb

## 2023-02-03 DIAGNOSIS — E1142 Type 2 diabetes mellitus with diabetic polyneuropathy: Secondary | ICD-10-CM | POA: Diagnosis not present

## 2023-02-03 DIAGNOSIS — E538 Deficiency of other specified B group vitamins: Secondary | ICD-10-CM | POA: Diagnosis not present

## 2023-02-03 MED ORDER — AMITRIPTYLINE HCL 25 MG PO TABS: 25.0000 mg | ORAL_TABLET | Freq: Every day | ORAL | 3 refills | Status: DC

## 2023-02-03 MED ORDER — VITAMIN B-12 1000 MCG PO TABS: 1000.0000 ug | ORAL_TABLET | Freq: Every day | ORAL | 1 refills | Status: AC

## 2023-02-03 NOTE — Patient Instructions (Addendum)
Continue with Duloxetine and Gabapentin  Trial of low dose Amitriptyline  Call us for any side effects or any new symptoms  Start vitamin B12 supplement, 1000 mg daily. Return in 6 months or sooner if worse

## 2023-02-03 NOTE — Progress Notes (Signed)
[facility]  PATIENT: Ellen Avery DOB: Apr 15, 1966  REQUESTING CLINICIAN: Renee Rival, FNP HISTORY FROM: Patient  REASON FOR VISIT: Bilateral lower extremities neuropathy    HISTORICAL  CHIEF COMPLAINT:  Chief Complaint  Patient presents with   New Patient (Initial Visit)    Rm 12, alone C/o neuropathy in both feet and left leg      HISTORY OF PRESENT ILLNESS:  This is a 57 year old woman past medical history of diabetes mellitus diagnosed in her 74s, diabetic neuropathy, fibromyalgia, hypertension hyperlipidemia who is presenting with worsening neuropathy.  Patient reports having a long history of diabetic neuropathy, has tried multiple medications including Lyrica, lidocaine cream, lidocaine patch.  Currently she is on duloxetine 60 mg daily and gabapentin 300 mg 3 times daily.  She reported in the past 4 to 5 months she started Ozempic, her A1c went from 11 down to 7 and during this time her neuropathy pain got worse.  She presented to the ED due to worsening pain.  She described the pain as starting as an itch then it would progress to the point that she wanted below skin of. She also does have burning sensation in the thighs Denies any recent falls    OTHER MEDICAL CONDITIONS: Long history of diabetes mellitus and diabetic neuropathy, fibromyalgia, hypertension, hyperlipidemia    REVIEW OF SYSTEMS: Full 14 system review of systems performed and negative with exception of: As noted in the HPI  ALLERGIES: Allergies  Allergen Reactions   Doxycycline Hives and Itching   Flagyl [Metronidazole] Hives and Itching    Benadryl     HOME MEDICATIONS: Outpatient Medications Prior to Visit  Medication Sig Dispense Refill   ACCU-CHEK AVIVA PLUS test strip Check blood sugar 3-4 times daily 300 each 2   amoxicillin-clavulanate (AUGMENTIN) 875-125 MG tablet Take 1 tablet by mouth 2 (two) times daily. 14 tablet 0   aspirin EC 81 MG tablet Take 81 mg by  mouth daily.     Cholecalciferol (VITAMIN D3) 3000 units TABS Take 3,000 Units by mouth daily as needed (vitamin d).     DULoxetine (CYMBALTA) 60 MG capsule Take 1 capsule (60 mg total) by mouth daily. 90 capsule 1   famotidine (PEPCID) 20 MG tablet Take 20 mg by mouth every morning.     fenofibrate (TRICOR) 48 MG tablet Take 48 mg by mouth daily.     gabapentin (NEURONTIN) 300 MG capsule Take 1 capsule (300 mg total) by mouth 3 (three) times daily. 90 capsule 3   GNP ULTICARE PEN NEEDLES 32G X 4 MM MISC USE WITH insulin ONCE DAILY 100 each 2   hydrOXYzine (ATARAX) 25 MG tablet Take 1 tablet (25 mg total) by mouth every 8 (eight) hours as needed for anxiety. 90 tablet 0   LANTUS SOLOSTAR 100 UNIT/ML Solostar Pen Inject 60 Units into the skin at bedtime. 15 mL 3   LINZESS 145 MCG CAPS capsule Take 145 mcg by mouth every morning.     losartan-hydrochlorothiazide (HYZAAR) 100-12.5 MG tablet Take 1 tablet by mouth daily. 90 tablet 1   rosuvastatin (CRESTOR) 40 MG tablet Take 1 tablet (40 mg total) by mouth daily. 90 tablet 1   Semaglutide, 1 MG/DOSE, 4 MG/3ML SOPN Inject 1 mg as directed once a week. 3 mL 2   tiZANidine (ZANAFLEX) 4 MG tablet Take 1 tablet (4 mg total) by mouth every 8 (eight) hours as needed. 30 tablet 0   No facility-administered medications prior to visit.  PAST MEDICAL HISTORY: Past Medical History:  Diagnosis Date   Anxiety    Arthritis    Chronic headaches    DDD (degenerative disc disease), cervical    Fibromyalgia    GERD (gastroesophageal reflux disease)    History of colon polyps    History of psychosis    Hypercholesteremia    Hypertension    IBS (irritable bowel syndrome)    IBS (irritable bowel syndrome)    MDD (major depressive disorder)    Right sided facial pain    Seasonal allergic rhinitis    Sinus congestion    12-24-2018 per pt no fever   Type 2 diabetes mellitus treated with insulin ([place])    Vitamin D deficiency    Wears dentures    full  upper,  partial lower   Wears glasses     PAST SURGICAL HISTORY: Past Surgical History:  Procedure Laterality Date   ARTERY BIOPSY Right 12/28/2018   Procedure: RIGHT BIOPSY TEMPORAL ARTERY;  Surgeon: Kinsinger, Arta Bruce, MD;  Location: [facility];  Service: General;  Laterality: Right;   CARDIAC CATHETERIZATION     MULTIPLE TOOTH EXTRACTIONS  04/ 2019    sedation   TOTAL ABDOMINAL HYSTERECTOMY W/ BILATERAL SALPINGOOPHORECTOMY  2009    FAMILY HISTORY: Family History  Problem Relation Age of Onset   Diabetes Mother    Heart disease Mother    High blood pressure Mother    Hyperlipidemia Mother    Diabetes Mellitus II Father    Heart Problems Brother    Breast cancer Maternal Aunt    Cervical cancer Paternal Aunt    Diabetes Paternal Uncle    Heart disease Paternal Grandmother    Breast cancer Paternal Grandmother    Head & neck cancer Cousin    Colon cancer Neg Hx    Colon polyps Neg Hx    Esophageal cancer Neg Hx    Rectal cancer Neg Hx    Stomach cancer Neg Hx     SOCIAL HISTORY: Social History   Socioeconomic History   Marital status: Divorced    Spouse name: Not on file   Number of children: 2   Years of education: Not on file   Highest education level: Not on file  Occupational History   Not on file  Tobacco Use   Smoking status: Every Day    Packs/day: 1.00    Years: 35.00    Total pack years: 35.00    Types: Cigarettes   Smokeless tobacco: Never  Vaping Use   Vaping Use: Never used  Substance and Sexual Activity   Alcohol use: No   Drug use: No   Sexual activity: Not Currently    Birth control/protection: Surgical  Other Topics Concern   Not on file  Social History Narrative   Lives home alone    Social Determinants of Health   Financial Resource Strain: Not on file  Food Insecurity: Not on file  Transportation Needs: Not on file  Physical Activity: Not on file  Stress: Not on file  Social Connections: Not on file   Intimate Partner Violence: Not on file    PHYSICAL EXAM  GENERAL EXAM/CONSTITUTIONAL: Vitals:  Vitals:   02/03/23 1021  BP: 119/74  Pulse: 70  Weight: 220 lb (99.8 kg)  Height: '5\' 5"'$  (1.651 m)   Body mass index is 36.61 kg/m. Wt Readings from Last 3 Encounters:  02/03/23 220 lb (99.8 kg)  01/21/23 220 lb 9.6 oz (100.1 kg)  12/24/22  222 lb (100.7 kg)   Patient is in no distress; well developed, nourished and groomed; neck is supple  EYES: Visual fields full to confrontation, Extraocular movements intacts,   MUSCULOSKELETAL: Gait, strength, tone, movements noted in Neurologic exam below  NEUROLOGIC: MENTAL STATUS:      No data to display         awake, alert, oriented to person, place and time recent and remote memory intact normal attention and concentration language fluent, comprehension intact, naming intact fund of knowledge appropriate  CRANIAL NERVE:  2nd, 3rd, 4th, 6th - Visual fields full to confrontation, extraocular muscles intact, no nystagmus 5th - facial sensation symmetric 7th - facial strength symmetric 8th - hearing intact 9th - palate elevates symmetrically, uvula midline 11th - shoulder shrug symmetric 12th - tongue protrusion midline  MOTOR:  normal bulk and tone, full strength in the BUE, BLE  SENSORY:  Decrease sensation to vibration in feet up to ankles otherwise normal.   COORDINATION:  finger-nose-finger, fine finger movements normal  REFLEXES:  deep tendon reflexes present and symmetric  GAIT/STATION:  normal   DIAGNOSTIC DATA (LABS, IMAGING, TESTING) - I reviewed patient records, labs, notes, testing and imaging myself where available.  Lab Results  Component Value Date   WBC 7.7 12/24/2022   HGB 13.3 12/24/2022   HCT 41.9 12/24/2022   MCV 87.1 12/24/2022   PLT 371 12/24/2022      Component Value Date/Time   NA 138 12/24/2022 1506   NA 141 10/22/2022 1443   K 3.8 12/24/2022 1506   CL 104 12/24/2022 1506    CO2 25 12/24/2022 1506   GLUCOSE 142 (H) 12/24/2022 1506   BUN 11 12/24/2022 1506   BUN 13 10/22/2022 1443   CREATININE 0.86 12/24/2022 1506   CREATININE 0.69 04/15/2021 0000   CALCIUM 9.6 12/24/2022 1506   PROT 6.6 05/13/2022 1054   ALBUMIN 3.5 05/13/2022 1054   AST 14 (L) 05/13/2022 1054   ALT 14 05/13/2022 1054   ALKPHOS 70 05/13/2022 1054   BILITOT 0.3 05/13/2022 1054   GFRNONAA >60 12/24/2022 1506   GFRNONAA 99 04/15/2021 0000   GFRAA 114 04/15/2021 0000   Lab Results  Component Value Date   CHOL 166 01/21/2023   HDL 56 01/21/2023   LDLCALC 90 01/21/2023   TRIG 110 01/21/2023   CHOLHDL 3.0 01/21/2023   Lab Results  Component Value Date   HGBA1C 7.2 (A) 01/21/2023   Lab Results  Component Value Date   VITAMINB12 233 03/19/2022   Lab Results  Component Value Date   TSH 0.57 03/19/2022     ASSESSMENT AND PLAN  57 y.o. year old female with medical conditions including fibromyalgia, diabetes neuropathy, insulin-dependent type 2 diabetes mellitus, hypertension who is presenting with worsening of her diabetes neuropathy pain in the past few months.  During this time patient also started Ozempic with a rapid correction of her hemoglobin A1C, going from 11 down to 7.  I think her worsening pain is due to treatment induced neuropathy on top of her diabetic neuropathy.  I have told patient over time this will get better as long as she is able to maintain her hemoglobin A1c in the 7 range her pain will improve.  She is already on gabapentin 300 mg 3 times daily and duloxetine, I will add a low-dose amitriptyline at night.  I am aware that she is on duloxetine so we will watch for symptoms associated with serotonin syndrome.  I have advised patient  to contact me if she has any worsening headaches, worsening stiffness or any new concerns.  I will see her in 6 months for follow-up. In the cases that her symptoms do not improve we will plan for an EMG nerve conduction study.  Her  vitamin B12 was also noted to be low, will add vitamin B12 supplement, 1000 mcg daily    1. Diabetic polyneuropathy associated with type 2 diabetes mellitus ([place])   2. Vitamin B12 deficiency      Patient Instructions  Continue with Duloxetine and Gabapentin  Trial of low dose Amitriptyline  Call us for any side effects or any new symptoms  Start vitamin B12 supplement, 1000 mg daily. Return in 6 months or sooner if worse   No orders of the defined types were placed in this encounter.   Meds ordered this encounter  Medications   amitriptyline (ELAVIL) 25 MG tablet    Sig: Take 1 tablet (25 mg total) by mouth at bedtime.    Dispense:  30 tablet    Refill:  3   cyanocobalamin (VITAMIN B12) 1000 MCG tablet    Sig: Take 1 tablet (1,000 mcg total) by mouth daily.    Dispense:  90 tablet    Refill:  1    Return in about 6 months (around 08/04/2023).   Alric Ran, MD 02/03/2023, 12:17 PM  [facility] [address] [place], [place] [address] [phone]

## 2023-02-04 NOTE — Telephone Encounter (Signed)
She was just started on amitriptyline yesterday. It can take a few weeks for this to start to help, so she should give it some more time to start working

## 2023-02-05 ENCOUNTER — Other Ambulatory Visit: Payer: Self-pay

## 2023-02-05 DIAGNOSIS — F419 Anxiety disorder, unspecified: Secondary | ICD-10-CM

## 2023-02-05 MED ORDER — FENOFIBRATE 48 MG PO TABS: 48.0000 mg | ORAL_TABLET | Freq: Every day | ORAL | 1 refills | Status: DC

## 2023-02-05 MED ORDER — LINZESS 145 MCG PO CAPS: 145.0000 ug | ORAL_CAPSULE | Freq: Every morning | ORAL | 1 refills | Status: DC

## 2023-02-05 MED ORDER — FAMOTIDINE 20 MG PO TABS: 20.0000 mg | ORAL_TABLET | Freq: Every morning | ORAL | 1 refills | Status: DC

## 2023-02-05 NOTE — Telephone Encounter (Signed)
Please advise KH 

## 2023-02-05 NOTE — Telephone Encounter (Signed)
She specifically told me that Amitriptyline did help her in the past, that is the reason we restarted it. She is also on Duloxetine and Gabapentin. Her neuropathy got worse due to rapid correction of her HgA1C. I wont prescribed her any opiods.

## 2023-02-06 MED ORDER — HYDROXYZINE HCL 25 MG PO TABS: 25.0000 mg | ORAL_TABLET | Freq: Three times a day (TID) | ORAL | 0 refills | Status: DC | PRN

## 2023-02-06 MED ORDER — TIZANIDINE HCL 4 MG PO TABS: 4.0000 mg | ORAL_TABLET | Freq: Three times a day (TID) | ORAL | 0 refills | Status: DC | PRN

## 2023-02-10 ENCOUNTER — Other Ambulatory Visit: Payer: Self-pay | Admitting: Nurse Practitioner

## 2023-02-10 DIAGNOSIS — F419 Anxiety disorder, unspecified: Secondary | ICD-10-CM

## 2023-02-10 MED ORDER — HYDROXYZINE HCL 25 MG PO TABS: 25.0000 mg | ORAL_TABLET | Freq: Three times a day (TID) | ORAL | 1 refills | Status: DC | PRN

## 2023-02-18 ENCOUNTER — Ambulatory Visit: Payer: Self-pay | Admitting: Nurse Practitioner

## 2023-03-06 ENCOUNTER — Ambulatory Visit
Admission: RE | Admit: 2023-03-06 | Discharge: 2023-03-06 | Disposition: A | Discharge status: Home / Self Care | Payer: Medicaid Other | Source: Ambulatory Visit | Attending: Internal Medicine | Admitting: Internal Medicine

## 2023-03-06 DIAGNOSIS — Z1231 Encounter for screening mammogram for malignant neoplasm of breast: Secondary | ICD-10-CM | POA: Diagnosis not present

## 2023-03-13 ENCOUNTER — Other Ambulatory Visit: Payer: Self-pay | Admitting: Nurse Practitioner

## 2023-03-16 ENCOUNTER — Other Ambulatory Visit: Payer: Self-pay

## 2023-03-16 NOTE — Telephone Encounter (Signed)
Please advise Kh 

## 2023-03-16 NOTE — Telephone Encounter (Signed)
From: Drue Novel To: Office of Donell Beers, [place] Sent: 03/13/2023 2:05 PM EDT Subject: Medication Renewal Request  Refills have been requested for the following medications:   tiZANidine (ZANAFLEX) 4 MG tablet [Folashade R Paseda]  Preferred pharmacy: [facility] - [place], [place] - [address] Delivery method: Daryll Drown

## 2023-03-17 ENCOUNTER — Telehealth: Payer: Self-pay | Admitting: Nurse Practitioner

## 2023-03-17 ENCOUNTER — Other Ambulatory Visit: Payer: Self-pay | Admitting: Nurse Practitioner

## 2023-03-17 NOTE — Telephone Encounter (Signed)
Called pt and advice to call pharmacy to verified refills. Gh

## 2023-03-17 NOTE — Telephone Encounter (Signed)
Caller & Relationship to patient:  MRN #  [id number]   Call Back Number:   Date of Last Office Visit: 03/17/2023     Date of Next Office Visit: 03/23/2023    Medication(s) to be Refilled: Muscle relaxer , Anxiety refills  Preferred Pharmacy:   ** Please notify patient to allow 48-72 hours to process** **Let patient know to contact pharmacy at the end of the day to make sure medication is ready. ** **If patient has not been seen in a year or longer, book an appointment **Advise to use MyChart for refill requests OR to contact their pharmacy

## 2023-03-20 ENCOUNTER — Other Ambulatory Visit: Payer: Self-pay

## 2023-03-20 NOTE — Telephone Encounter (Signed)
Please advise KH 

## 2023-03-20 NOTE — Telephone Encounter (Signed)
From: Drue Novel To: Office of Donell Beers, [place] Sent: 03/19/2023 7:16 PM EDT Subject: Medication Renewal Request  Refills have been requested for the following medications:   tiZANidine (ZANAFLEX) 4 MG tablet [Folashade R Paseda]  Preferred pharmacy: [facility] - [place], [place] - [address] Delivery method: Daryll Drown

## 2023-03-20 NOTE — Telephone Encounter (Signed)
Pt picked up the medications Per pt.Gh

## 2023-03-23 ENCOUNTER — Ambulatory Visit: Payer: Medicaid Other | Admitting: Nurse Practitioner

## 2023-04-09 ENCOUNTER — Other Ambulatory Visit: Payer: Self-pay

## 2023-04-09 ENCOUNTER — Other Ambulatory Visit: Payer: Self-pay | Admitting: Nurse Practitioner

## 2023-04-09 DIAGNOSIS — F419 Anxiety disorder, unspecified: Secondary | ICD-10-CM

## 2023-04-09 DIAGNOSIS — E1165 Type 2 diabetes mellitus with hyperglycemia: Secondary | ICD-10-CM

## 2023-04-09 NOTE — Telephone Encounter (Signed)
Please advise. kh 

## 2023-04-09 NOTE — Telephone Encounter (Signed)
Please advise KH 

## 2023-04-09 NOTE — Telephone Encounter (Signed)
From: Drue Novel To: Office of Donell Beers, [place] Sent: 04/09/2023 6:21 AM EDT Subject: Medication Renewal Request  Refills have been requested for the following medications:   tiZANidine (ZANAFLEX) 4 MG tablet [Folashade R Paseda]  Preferred pharmacy: [facility] - [place], [place] - [address] Delivery method: Daryll Drown

## 2023-04-22 ENCOUNTER — Ambulatory Visit (INDEPENDENT_AMBULATORY_CARE_PROVIDER_SITE_OTHER): Payer: Medicaid Other | Admitting: Nurse Practitioner

## 2023-04-22 ENCOUNTER — Encounter: Payer: Self-pay | Admitting: Nurse Practitioner

## 2023-04-22 ENCOUNTER — Encounter: Payer: Self-pay | Admitting: Neurology

## 2023-04-22 VITALS — BP 113/63 | HR 81 | Temp 97.8°F | Ht 67.0 in | Wt 225.6 lb

## 2023-04-22 DIAGNOSIS — Z794 Long term (current) use of insulin: Secondary | ICD-10-CM | POA: Diagnosis not present

## 2023-04-22 DIAGNOSIS — Z720 Tobacco use: Secondary | ICD-10-CM | POA: Diagnosis not present

## 2023-04-22 DIAGNOSIS — E1143 Type 2 diabetes mellitus with diabetic autonomic (poly)neuropathy: Secondary | ICD-10-CM | POA: Diagnosis not present

## 2023-04-22 DIAGNOSIS — Z79899 Other long term (current) drug therapy: Secondary | ICD-10-CM | POA: Diagnosis not present

## 2023-04-22 DIAGNOSIS — M5030 Other cervical disc degeneration, unspecified cervical region: Secondary | ICD-10-CM | POA: Diagnosis not present

## 2023-04-22 DIAGNOSIS — E669 Obesity, unspecified: Secondary | ICD-10-CM | POA: Diagnosis not present

## 2023-04-22 DIAGNOSIS — J302 Other seasonal allergic rhinitis: Secondary | ICD-10-CM | POA: Diagnosis not present

## 2023-04-22 DIAGNOSIS — F419 Anxiety disorder, unspecified: Secondary | ICD-10-CM | POA: Diagnosis not present

## 2023-04-22 DIAGNOSIS — Z6835 Body mass index (BMI) 35.0-35.9, adult: Secondary | ICD-10-CM | POA: Diagnosis not present

## 2023-04-22 DIAGNOSIS — Z0000 Encounter for general adult medical examination without abnormal findings: Secondary | ICD-10-CM | POA: Insufficient documentation

## 2023-04-22 DIAGNOSIS — H53149 Visual discomfort, unspecified: Secondary | ICD-10-CM | POA: Diagnosis not present

## 2023-04-22 DIAGNOSIS — Z973 Presence of spectacles and contact lenses: Secondary | ICD-10-CM | POA: Diagnosis not present

## 2023-04-22 DIAGNOSIS — R519 Headache, unspecified: Secondary | ICD-10-CM | POA: Diagnosis not present

## 2023-04-22 DIAGNOSIS — I10 Essential (primary) hypertension: Secondary | ICD-10-CM | POA: Diagnosis not present

## 2023-04-22 LAB — POCT GLYCOSYLATED HEMOGLOBIN (HGB A1C): Hemoglobin A1C: 7.2 % — AB (ref 4.0–5.6)

## 2023-04-22 MED ORDER — LANTUS SOLOSTAR 100 UNIT/ML ~~LOC~~ SOPN: 65.0000 [IU] | PEN_INJECTOR | Freq: Every day | SUBCUTANEOUS | 3 refills | Status: DC

## 2023-04-22 NOTE — Assessment & Plan Note (Addendum)
Lab Results  Component Value Date   HGBA1C 7.2 (A) 04/22/2023  A1c unchanged at 7.2 Currently on Lantus 65 units daily, Ozempic 1 mg once weekly injection Continue current medications Low-carb diet encouraged, need to engage in regular daily exercises as tolerated discussed.  She is currently on gabapentin 300 mg 3 times daily, Cymbalta 60 mg daily, amitriptyline 25 mg daily,, alpha lipoid acid for neuropathy.  Also taking vitamin B12. patient encouraged to call neurology office due to her uncontrolled symptoms.  Again I suggested referring to vascular for ABI but patient declined. I have sent a message to Dr Teresa Coombs A about any other recommendations regarding her diabetic neuropathy pending the time she sees her   14:12 Dr Teresa Coombs ok with increasing amitriptyline to 50mg  at bedtime, I contacted the patient and informed her but she states '' all the medications will just make me feel sleepy, I need to be able to stay awake''. I encouraged her to call neurology office and schedule an appointment as planned.   Follow-up in 3 months

## 2023-04-22 NOTE — Assessment & Plan Note (Signed)
Lab Results  Component Value Date   HGBA1C 7.2 (A) 01/21/2023

## 2023-04-22 NOTE — Progress Notes (Addendum)
Established Patient Office Visit  Subjective:  Patient ID: Ellen Avery, female    DOB: 19-Nov-1966  Age: 57 y.o. MRN: [id number]  CC:  Chief Complaint  Patient presents with   Photophobia   Annual Exam    Pt is fasting.     HPI Ellen Avery is a 57 y.o. female  has a past medical history of Anxiety, Arthritis, Chronic headaches, DDD (degenerative disc disease), cervical, Fibromyalgia, GERD (gastroesophageal reflux disease), History of colon polyps, History of psychosis, Hypercholesteremia, Hypertension, IBS (irritable bowel syndrome), IBS (irritable bowel syndrome), MDD (major depressive disorder), Right sided facial pain, Seasonal allergic rhinitis, Sinus congestion, Type 2 diabetes mellitus treated with insulin (HCC), Vitamin D deficiency, Wears dentures, and Wears glasses.  Patient presents for follow-up for her chronic medical conditions  Type 2 diabetes.  She is currently on Ozempic 1 mg once weekly injection, Lantus 60 units daily, she reports that she has been taking 65 units daily.  Patient denies polyphagia polyuria hypoglycemia.   Diabetic polyneuropathy.  She is currently on gabapentin 300 mg 3 times daily, duloxetine 60 mg daily, amitriptyline 25 mg daily.  She is currently established with neurology and they have a plan to do EMG nerve conduction test if her symptoms persists.  Patient reports that her symptoms was initially under control when she started takingAlpha-lipoic acid (600 mg daily)  but suddenly got worse after a while.  states that my legs feels like it is on fire. She is asking for an injection for her neuropathy and wants to go to the ER.    Past Medical History:  Diagnosis Date   Anxiety    Arthritis    Chronic headaches    DDD (degenerative disc disease), cervical    Fibromyalgia    GERD (gastroesophageal reflux disease)    History of colon polyps    History of psychosis    Hypercholesteremia    Hypertension    IBS (irritable bowel syndrome)     IBS (irritable bowel syndrome)    MDD (major depressive disorder)    Right sided facial pain    Seasonal allergic rhinitis    Sinus congestion    12-24-2018 per pt no fever   Type 2 diabetes mellitus treated with insulin (HCC)    Vitamin D deficiency    Wears dentures    full upper,  partial lower   Wears glasses     Past Surgical History:  Procedure Laterality Date   ARTERY BIOPSY Right 12/28/2018   Procedure: RIGHT BIOPSY TEMPORAL ARTERY;  Surgeon: Kinsinger, De Blanch, MD;  Location: [facility];  Service: General;  Laterality: Right;   CARDIAC CATHETERIZATION     MULTIPLE TOOTH EXTRACTIONS  04/ 2019    sedation   TOTAL ABDOMINAL HYSTERECTOMY W/ BILATERAL SALPINGOOPHORECTOMY  2009    Family History  Problem Relation Age of Onset   Diabetes Mother    Heart disease Mother    High blood pressure Mother    Hyperlipidemia Mother    Diabetes Mellitus II Father    Heart Problems Brother    Breast cancer Maternal Aunt    Cervical cancer Paternal Aunt    Diabetes Paternal Uncle    Heart disease Paternal Grandmother    Breast cancer Paternal Grandmother    Head & neck cancer Cousin    Colon cancer Neg Hx    Colon polyps Neg Hx    Esophageal cancer Neg Hx    Rectal cancer Neg Hx  Stomach cancer Neg Hx     Social History   Socioeconomic History   Marital status: Divorced    Spouse name: Not on file   Number of children: 2   Years of education: Not on file   Highest education level: Not on file  Occupational History   Not on file  Tobacco Use   Smoking status: Every Day    Packs/day: 1.00    Years: 35.00    Additional pack years: 0.00    Total pack years: 35.00    Types: Cigarettes   Smokeless tobacco: Never  Vaping Use   Vaping Use: Never used  Substance and Sexual Activity   Alcohol use: No   Drug use: No   Sexual activity: Not Currently    Birth control/protection: Surgical  Other Topics Concern   Not on file  Social History Narrative    Lives home alone    Social Determinants of Health   Financial Resource Strain: Not on file  Food Insecurity: Not on file  Transportation Needs: Not on file  Physical Activity: Not on file  Stress: Not on file  Social Connections: Not on file  Intimate Partner Violence: Not on file    Outpatient Medications Prior to Visit  Medication Sig Dispense Refill   ACCU-CHEK AVIVA PLUS test strip Check blood sugar 3-4 times daily 300 each 2   amitriptyline (ELAVIL) 25 MG tablet Take 1 tablet (25 mg total) by mouth at bedtime. 30 tablet 3   aspirin EC 81 MG tablet Take 81 mg by mouth daily.     cetirizine (ZYRTEC) 10 MG tablet Take 10 mg by mouth daily.     Cholecalciferol (VITAMIN D3) 3000 units TABS Take 3,000 Units by mouth daily as needed (vitamin d).     cyanocobalamin (VITAMIN B12) 1000 MCG tablet Take 1 tablet (1,000 mcg total) by mouth daily. 90 tablet 1   DULoxetine (CYMBALTA) 60 MG capsule TAKE 1 CAPSULE BY MOUTH ONCE DAILY 90 capsule 1   famotidine (PEPCID) 20 MG tablet Take 1 tablet (20 mg total) by mouth every morning. 90 tablet 1   fenofibrate (TRICOR) 48 MG tablet Take 1 tablet (48 mg total) by mouth daily. 90 tablet 1   fluticasone (FLONASE) 50 MCG/ACT nasal spray 2 sprays Once Daily.     gabapentin (NEURONTIN) 300 MG capsule Take 1 capsule (300 mg total) by mouth 3 (three) times daily. 90 capsule 3   GNP ULTICARE PEN NEEDLES 32G X 4 MM MISC USE WITH insulin ONCE DAILY 100 each 2   hydrOXYzine (ATARAX) 25 MG tablet Take 1 tablet by mouth every 8 hours as needed for anxiety. 90 tablet 1   LINZESS 145 MCG CAPS capsule Take 1 capsule (145 mcg total) by mouth every morning. 30 capsule 1   losartan-hydrochlorothiazide (HYZAAR) 100-12.5 MG tablet Take 1 tablet by mouth daily. 90 tablet 1   OZEMPIC, 1 MG/DOSE, 4 MG/3ML SOPN Inject 1 mg below the skin as directed once a week. 3 mL 2   rosuvastatin (CRESTOR) 40 MG tablet Take 1 tablet (40 mg total) by mouth daily. 90 tablet 1   tiZANidine  (ZANAFLEX) 4 MG tablet TAKE 1 TABLET BY MOUTH EVERY 8 HOURS AS NEEDED 30 tablet 0   LANTUS SOLOSTAR 100 UNIT/ML Solostar Pen Inject 60 Units into the skin at bedtime. 15 mL 3   amoxicillin-clavulanate (AUGMENTIN) 875-125 MG tablet Take 1 tablet by mouth 2 (two) times daily. 14 tablet 0   No facility-administered medications prior  to visit.    Allergies  Allergen Reactions   Doxycycline Hives and Itching   Flagyl [Metronidazole] Hives and Itching    Benadryl     ROS Review of Systems  Constitutional:  Negative for activity change, appetite change, chills, fatigue and fever.  HENT:  Negative for congestion, dental problem, ear discharge, ear pain, hearing loss, rhinorrhea, sinus pressure, sinus pain, sneezing and sore throat.   Eyes:  Negative for pain, discharge, redness and itching.  Respiratory:  Negative for cough, chest tightness, shortness of breath and wheezing.   Cardiovascular:  Negative for chest pain, palpitations and leg swelling.  Gastrointestinal:  Negative for abdominal distention, abdominal pain, anal bleeding, blood in stool, constipation, diarrhea, nausea, rectal pain and vomiting.  Endocrine: Negative for cold intolerance, heat intolerance, polydipsia, polyphagia and polyuria.  Genitourinary:  Negative for difficulty urinating, dysuria, flank pain, frequency, hematuria, menstrual problem, pelvic pain and vaginal bleeding.  Musculoskeletal:  Negative for arthralgias, back pain, gait problem, joint swelling and myalgias.  Skin:  Negative for color change, pallor, rash and wound.  Allergic/Immunologic: Negative for environmental allergies, food allergies and immunocompromised state.  Neurological:  Negative for dizziness, tremors, facial asymmetry, weakness and headaches.       Burning sensation  Hematological:  Negative for adenopathy. Does not bruise/bleed easily.  Psychiatric/Behavioral:  Negative for agitation, behavioral problems, confusion, decreased concentration,  hallucinations, self-injury and suicidal ideas.       Objective:    Physical Exam Vitals and nursing note reviewed.  Constitutional:      General: She is not in acute distress.    Appearance: Normal appearance. She is obese. She is not ill-appearing, toxic-appearing or diaphoretic.  HENT:     Mouth/Throat:     Mouth: Mucous membranes are moist.     Pharynx: Oropharynx is clear. No oropharyngeal exudate or posterior oropharyngeal erythema.  Eyes:     General: No scleral icterus.       Right eye: No discharge.        Left eye: No discharge.     Extraocular Movements: Extraocular movements intact.     Conjunctiva/sclera: Conjunctivae normal.  Cardiovascular:     Rate and Rhythm: Normal rate and regular rhythm.     Pulses: Normal pulses.     Heart sounds: Normal heart sounds. No murmur heard.    No friction rub. No gallop.  Pulmonary:     Effort: Pulmonary effort is normal. No respiratory distress.     Breath sounds: Normal breath sounds. No stridor. No wheezing, rhonchi or rales.  Chest:     Chest wall: No tenderness.  Abdominal:     General: There is no distension.     Palpations: Abdomen is soft.     Tenderness: There is no abdominal tenderness. There is no right CVA tenderness, left CVA tenderness or guarding.  Musculoskeletal:        General: No swelling, tenderness, deformity or signs of injury.     Right lower leg: No edema.     Left lower leg: No edema.  Skin:    General: Skin is warm and dry.     Capillary Refill: Capillary refill takes less than 2 seconds.     Coloration: Skin is not jaundiced or pale.     Findings: No bruising, erythema or lesion.  Neurological:     Mental Status: She is alert and oriented to person, place, and time.     Motor: No weakness.     Coordination: Coordination normal.  Gait: Gait normal.  Psychiatric:        Mood and Affect: Mood normal.        Behavior: Behavior normal.        Thought Content: Thought content normal.         Judgment: Judgment normal.     Comments: Patient was tearful     BP 113/63   Pulse 81   Temp 97.8 F (36.6 C)   Ht 5\' 7"  (1.702 m)   Wt 225 lb 9.6 oz (102.3 kg)   SpO2 99%   BMI 35.33 kg/m  Wt Readings from Last 3 Encounters:  04/22/23 225 lb 9.6 oz (102.3 kg)  02/03/23 220 lb (99.8 kg)  01/21/23 220 lb 9.6 oz (100.1 kg)    Lab Results  Component Value Date   TSH 0.57 03/19/2022   Lab Results  Component Value Date   WBC 7.7 12/24/2022   HGB 13.3 12/24/2022   HCT 41.9 12/24/2022   MCV 87.1 12/24/2022   PLT 371 12/24/2022   Lab Results  Component Value Date   NA 138 12/24/2022   K 3.8 12/24/2022   CO2 25 12/24/2022   GLUCOSE 142 (H) 12/24/2022   BUN 11 12/24/2022   CREATININE 0.86 12/24/2022   BILITOT 0.3 05/13/2022   ALKPHOS 70 05/13/2022   AST 14 (L) 05/13/2022   ALT 14 05/13/2022   PROT 6.6 05/13/2022   ALBUMIN 3.5 05/13/2022   CALCIUM 9.6 12/24/2022   ANIONGAP 9 12/24/2022   EGFR 84 10/22/2022   Lab Results  Component Value Date   CHOL 166 01/21/2023   Lab Results  Component Value Date   HDL 56 01/21/2023   Lab Results  Component Value Date   LDLCALC 90 01/21/2023   Lab Results  Component Value Date   TRIG 110 01/21/2023   Lab Results  Component Value Date   CHOLHDL 3.0 01/21/2023   Lab Results  Component Value Date   HGBA1C 7.2 (A) 04/22/2023      Assessment & Plan:   Problem List Items Addressed This Visit       Cardiovascular and Mediastinum   HTN (hypertension)    BP Readings from Last 3 Encounters:  04/22/23 113/63  02/03/23 119/74  01/21/23 106/67   HTN Controlled on losartan-hydrochlorothiazide 100-12.5 mg 1 tablet daily Continue current medications. No changes in management. Discussed DASH diet and dietary sodium restrictions Continue to increase dietary efforts and exercise.           Respiratory   Seasonal allergic rhinitis   Relevant Medications   fluticasone (FLONASE) 50 MCG/ACT nasal spray   cetirizine  (ZYRTEC) 10 MG tablet     Endocrine   Type 2 diabetes mellitus with diabetic autonomic neuropathy, with long-term current use of insulin (HCC) - Primary    Lab Results  Component Value Date   HGBA1C 7.2 (A) 04/22/2023  A1c unchanged at 7.2 Currently on Lantus 65 units daily, Ozempic 1 mg once weekly injection Continue current medications Low-carb diet encouraged, need to engage in regular daily exercises as tolerated discussed.  She is currently on gabapentin 300 mg 3 times daily, Cymbalta 60 mg daily, amitriptyline 25 mg daily,, alpha lipoid acid for neuropathy.  Also taking vitamin B12. patient encouraged to call neurology office due to her uncontrolled symptoms.  Again I suggested referring to vascular for ABI but patient declined. I have sent a message to Dr Teresa Coombs A about any other recommendations regarding her diabetic neuropathy pending the time she  sees her   14:12 Dr Teresa Coombs ok with increasing amitriptyline to 50mg  at bedtime, I contacted the patient and informed her but she states '' all the medications will just make me feel sleepy, I need to be able to stay awake''. I encouraged her to call neurology office and schedule an appointment as planned.   Follow-up in 3 months      Relevant Medications   LANTUS SOLOSTAR 100 UNIT/ML Solostar Pen   Other Relevant Orders   POCT glycosylated hemoglobin (Hb A1C) (Completed)     Other   Tobacco abuse    Patient continues to smoke cigarettes, smoking cessation encouraged       Meds ordered this encounter  Medications   LANTUS SOLOSTAR 100 UNIT/ML Solostar Pen    Sig: Inject 65 Units into the skin at bedtime.    Dispense:  15 mL    Refill:  3    Follow-up: Return in about 3 months (around 07/23/2023) for DM, HTN.    Donell Beers, FNP

## 2023-04-22 NOTE — Assessment & Plan Note (Signed)
BP Readings from Last 3 Encounters:  04/22/23 113/63  02/03/23 119/74  01/21/23 106/67   HTN Controlled on losartan-hydrochlorothiazide 100-12.5 mg 1 tablet daily Continue current medications. No changes in management. Discussed DASH diet and dietary sodium restrictions Continue to increase dietary efforts and exercise.

## 2023-04-22 NOTE — Patient Instructions (Signed)

## 2023-04-22 NOTE — Progress Notes (Signed)
Complete physical exam  Patient: Ellen Avery   DOB: 31-Jan-1966   57 y.o. Female  MRN: [id number]  Subjective:    Chief Complaint  Patient presents with   Photophobia   Annual Exam    Pt is fasting.     Ellen Avery is a 57 y.o. female who presents today for a complete physical exam. She reports consuming a general diet. The patient does not participate in regular exercise at present.   Lipoillic acid was doing goo initially, legs still feels like on fire,    Most recent fall risk assessment:    04/22/2023   10:21 AM  Fall Risk   Falls in the past year? 0  Number falls in past yr: 0  Injury with Fall? 0  Risk for fall due to : No Fall Risks  Follow up Falls evaluation completed     Most recent depression screenings:    04/22/2023   10:21 AM 01/21/2023    2:05 PM  PHQ 2/9 Scores  PHQ - 2 Score 6 3  PHQ- 9 Score 15 11        Patient Care Team: Donell Beers, FNP as PCP - General (Nurse Practitioner) Baldo Daub, MD as PCP - Cardiology (Cardiology)   Outpatient Medications Prior to Visit  Medication Sig   ACCU-CHEK AVIVA PLUS test strip Check blood sugar 3-4 times daily   amitriptyline (ELAVIL) 25 MG tablet Take 1 tablet (25 mg total) by mouth at bedtime.   aspirin EC 81 MG tablet Take 81 mg by mouth daily.   cetirizine (ZYRTEC) 10 MG tablet Take 10 mg by mouth daily.   Cholecalciferol (VITAMIN D3) 3000 units TABS Take 3,000 Units by mouth daily as needed (vitamin d).   cyanocobalamin (VITAMIN B12) 1000 MCG tablet Take 1 tablet (1,000 mcg total) by mouth daily.   DULoxetine (CYMBALTA) 60 MG capsule TAKE 1 CAPSULE BY MOUTH ONCE DAILY   famotidine (PEPCID) 20 MG tablet Take 1 tablet (20 mg total) by mouth every morning.   fenofibrate (TRICOR) 48 MG tablet Take 1 tablet (48 mg total) by mouth daily.   fluticasone (FLONASE) 50 MCG/ACT nasal spray 2 sprays Once Daily.   gabapentin (NEURONTIN) 300 MG capsule Take 1 capsule (300 mg total) by mouth 3  (three) times daily.   GNP ULTICARE PEN NEEDLES 32G X 4 MM MISC USE WITH insulin ONCE DAILY   hydrOXYzine (ATARAX) 25 MG tablet Take 1 tablet by mouth every 8 hours as needed for anxiety.   LANTUS SOLOSTAR 100 UNIT/ML Solostar Pen Inject 60 Units into the skin at bedtime.   LINZESS 145 MCG CAPS capsule Take 1 capsule (145 mcg total) by mouth every morning.   losartan-hydrochlorothiazide (HYZAAR) 100-12.5 MG tablet Take 1 tablet by mouth daily.   OZEMPIC, 1 MG/DOSE, 4 MG/3ML SOPN Inject 1 mg below the skin as directed once a week.   rosuvastatin (CRESTOR) 40 MG tablet Take 1 tablet (40 mg total) by mouth daily.   tiZANidine (ZANAFLEX) 4 MG tablet TAKE 1 TABLET BY MOUTH EVERY 8 HOURS AS NEEDED   amoxicillin-clavulanate (AUGMENTIN) 875-125 MG tablet Take 1 tablet by mouth 2 (two) times daily.   No facility-administered medications prior to visit.    Review of Systems     Objective:     BP 113/63   Pulse 81   Temp 97.8 F (36.6 C)   Ht 5\' 7"  (1.702 m)   Wt 225 lb 9.6 oz (102.3 kg)  SpO2 99%   BMI 35.33 kg/m    Physical Exam  No results found for any visits on 04/22/23.     Assessment & Plan:    Routine Health Maintenance and Physical Exam  Immunization History  Administered Date(s) Administered   Covid-19, Mrna,Vaccine(Spikevax)43yrs and older 10/03/2022   Influenza-Unspecified 01/10/2022, 10/03/2022   PFIZER(Purple Top)SARS-COV-2 Vaccination 03/02/2020, 03/23/2020   Pfizer Covid-19 Vaccine Bivalent Booster 49yrs & up 03/30/2021    Health Maintenance  Topic Date Due   Hepatitis C Screening  Never done   PAP SMEAR-Modifier  Never done   Lung Cancer Screening  05/10/2021   HIV Screening  10/23/2023 (Originally 10/15/1981)   Zoster Vaccines- Shingrix (1 of 2) 11/04/2023 (Originally 10/15/2016)   INFLUENZA VACCINE  07/09/2023   HEMOGLOBIN A1C  07/22/2023   OPHTHALMOLOGY EXAM  10/14/2023   Diabetic kidney evaluation - Urine ACR  10/23/2023   FOOT EXAM  10/23/2023    Diabetic kidney evaluation - eGFR measurement  12/25/2023   MAMMOGRAM  03/05/2025   COLONOSCOPY (Pts 45-49yrs Insurance coverage will need to be confirmed)  05/28/2028   COVID-19 Vaccine  Completed   HPV VACCINES  Aged Out   DTaP/Tdap/Td  Discontinued    Discussed health benefits of physical activity, and encouraged her to engage in regular exercise appropriate for her age and condition.  Problem List Items Addressed This Visit   None  No follow-ups on file.     Donell Beers, FNP

## 2023-04-22 NOTE — Assessment & Plan Note (Signed)
Patient continues to smoke cigarettes, smoking cessation encouraged

## 2023-04-28 ENCOUNTER — Encounter: Payer: Self-pay | Admitting: Nurse Practitioner

## 2023-05-19 ENCOUNTER — Other Ambulatory Visit: Payer: Self-pay | Admitting: Neurology

## 2023-05-19 DIAGNOSIS — M79604 Pain in right leg: Secondary | ICD-10-CM

## 2023-05-19 MED ORDER — GABAPENTIN 600 MG PO TABS: 600.0000 mg | ORAL_TABLET | Freq: Three times a day (TID) | ORAL | 3 refills | Status: DC

## 2023-05-21 ENCOUNTER — Other Ambulatory Visit: Payer: Self-pay

## 2023-05-21 DIAGNOSIS — J302 Other seasonal allergic rhinitis: Secondary | ICD-10-CM

## 2023-05-21 MED ORDER — CETIRIZINE HCL 10 MG PO TABS
10.0000 mg | ORAL_TABLET | Freq: Every day | ORAL | 0 refills | Status: DC
Start: 2023-05-21 — End: 2024-01-08

## 2023-05-21 NOTE — Telephone Encounter (Signed)
Please advise KH 

## 2023-05-21 NOTE — Telephone Encounter (Signed)
From: Drue Novel To: Office of Donell Beers, [place] Sent: 05/21/2023 12:35 PM EDT Subject: Medication Renewal Request  Refills have been requested for the following medications:   tiZANidine (ZANAFLEX) 4 MG tablet [Folashade R Paseda]   cetirizine (ZYRTEC) 10 MG tablet  Preferred pharmacy: [facility] - [place], [place] - [id number] Delivery method: Daryll Drown

## 2023-05-22 ENCOUNTER — Other Ambulatory Visit: Payer: Self-pay

## 2023-05-22 MED ORDER — TIZANIDINE HCL 4 MG PO TABS: 4.0000 mg | ORAL_TABLET | Freq: Three times a day (TID) | ORAL | 0 refills | Status: DC | PRN

## 2023-05-25 ENCOUNTER — Other Ambulatory Visit: Payer: Self-pay

## 2023-06-04 ENCOUNTER — Ambulatory Visit (HOSPITAL_COMMUNITY)
Admission: EM | Admit: 2023-06-04 | Discharge: 2023-06-04 | Disposition: A | Discharge status: Home / Self Care | Payer: Medicaid Other | Attending: Urgent Care | Admitting: Urgent Care

## 2023-06-04 ENCOUNTER — Encounter (HOSPITAL_COMMUNITY): Payer: Self-pay

## 2023-06-04 DIAGNOSIS — S00522A Blister (nonthermal) of oral cavity, initial encounter: Secondary | ICD-10-CM

## 2023-06-04 DIAGNOSIS — K0510 Chronic gingivitis, plaque induced: Secondary | ICD-10-CM | POA: Diagnosis not present

## 2023-06-04 DIAGNOSIS — R590 Localized enlarged lymph nodes: Secondary | ICD-10-CM

## 2023-06-04 MED ORDER — CHLORHEXIDINE GLUCONATE 0.12 % MT SOLN: 10.0000 mL | Freq: Two times a day (BID) | OROMUCOSAL | 0 refills | Status: DC

## 2023-06-04 MED ORDER — AMOXICILLIN-POT CLAVULANATE 875-125 MG PO TABS: 1.0000 | ORAL_TABLET | Freq: Two times a day (BID) | ORAL | 0 refills | Status: DC

## 2023-06-04 NOTE — ED Triage Notes (Signed)
Sore throat and sinus symptoms onset around a week. Has tried otc Mucinex with slight relief. No known sick exposure.   Last night she was eating a sandwich and got to the point she could not swallow. Felt some swelling in the lymph nodes. Pain going up into the right ear.

## 2023-06-04 NOTE — ED Provider Notes (Signed)
[facility]    CSN: [id number] Arrival date & time: 06/04/23  4403      History   Chief Complaint Chief Complaint  Patient presents with   Sore Throat    HPI Ellen Avery is a 57 y.o. female.   Pleasant 57 year old female presents today primarily due to concerns of a sore in her mouth and swollen lymph nodes.  For the past 1 and half weeks, she has had sinus pressure and congestion.  She reports a known history of allergies for which she usually takes Zyrtec.  Yesterday however, she reports difficulty swallowing her food due to pain in her throat.  Denies food sticking or any swelling in her tongue or throat.  She denies a fever.  Does have an intermittent dry cough, but feels this is unchanged.  Woke up this morning and felt that the right side of her neck was very painful and swollen.  Also reports some pressure and popping in her ears. Did try some OTC mucinex for her symptoms without symptom relief.    Sore Throat    Past Medical History:  Diagnosis Date   Anxiety    Arthritis    Chronic headaches    DDD (degenerative disc disease), cervical    Fibromyalgia    GERD (gastroesophageal reflux disease)    History of colon polyps    History of psychosis    Hypercholesteremia    Hypertension    IBS (irritable bowel syndrome)    IBS (irritable bowel syndrome)    MDD (major depressive disorder)    Right sided facial pain    Seasonal allergic rhinitis    Sinus congestion    12-24-2018 per pt no fever   Type 2 diabetes mellitus treated with insulin (HCC)    Vitamin D deficiency    Wears dentures    full upper,  partial lower   Wears glasses     Patient Active Problem List   Diagnosis Date Noted   Annual physical exam 04/22/2023   Type 2 diabetes mellitus with diabetic autonomic neuropathy, with long-term current use of insulin (HCC) 04/22/2023   Seasonal allergic rhinitis 04/22/2023   Tobacco abuse 01/21/2023   Anxiety 01/21/2023   Uncontrolled type 2  diabetes mellitus with hyperglycemia (HCC) 10/22/2022   Tobacco abuse counseling 10/22/2022   Leg pain, bilateral 10/22/2022   Fibromyalgia 10/22/2022   Chest pain in adult 07/20/2019   IDDM (insulin dependent diabetes mellitus) 07/08/2018   HTN (hypertension) 07/08/2018   HLD (hyperlipidemia) 07/08/2018   Altered mental status 03/02/2016   MDD (major depressive disorder), recurrent episode, moderate (HCC) 03/02/2016   Psychoses (HCC)     Past Surgical History:  Procedure Laterality Date   ARTERY BIOPSY Right 12/28/2018   Procedure: RIGHT BIOPSY TEMPORAL ARTERY;  Surgeon: Sheliah Hatch, De Blanch, MD;  Location: [facility] [place];  Service: General;  Laterality: Right;   CARDIAC CATHETERIZATION     MULTIPLE TOOTH EXTRACTIONS  04/ 2019    sedation   TOTAL ABDOMINAL HYSTERECTOMY W/ BILATERAL SALPINGOOPHORECTOMY  2009    OB History   No obstetric history on file.      Home Medications    Prior to Admission medications   Medication Sig Start Date End Date Taking? Authorizing Provider  ACCU-CHEK AVIVA PLUS test strip Check blood sugar 3-4 times daily 12/12/22  Yes Paseda, Baird Kay, FNP  amitriptyline (ELAVIL) 25 MG tablet Take 1 tablet (25 mg total) by mouth at bedtime. 02/03/23  Yes Camara, Amadou,  MD  amoxicillin-clavulanate (AUGMENTIN) 875-125 MG tablet Take 1 tablet by mouth every 12 (twelve) hours. 06/04/23  Yes Dekayla Prestridge L, PA  aspirin EC 81 MG tablet Take 81 mg by mouth daily.   Yes [provider]  cetirizine (ZYRTEC) 10 MG tablet Take 1 tablet (10 mg total) by mouth daily. 05/21/23  Yes Paseda, Baird Kay, FNP  chlorhexidine (PERIDEX) 0.12 % solution Use as directed 10 mLs in the mouth or throat 2 (two) times daily. 06/04/23  Yes Zarra Geffert L, PA  Cholecalciferol (VITAMIN D3) 3000 units TABS Take 3,000 Units by mouth daily as needed (vitamin d).   Yes [provider]  DULoxetine (CYMBALTA) 60 MG capsule TAKE 1 CAPSULE BY MOUTH ONCE DAILY 04/10/23   Yes Paseda, Baird Kay, FNP  famotidine (PEPCID) 20 MG tablet Take 1 tablet (20 mg total) by mouth every morning. 02/05/23  Yes Paseda, Baird Kay, FNP  fenofibrate (TRICOR) 48 MG tablet Take 1 tablet (48 mg total) by mouth daily. 02/05/23  Yes Paseda, Baird Kay, FNP  fluticasone (FLONASE) 50 MCG/ACT nasal spray 2 sprays Once Daily. 01/08/18  Yes [provider]  gabapentin (NEURONTIN) 600 MG tablet Take 1 tablet (600 mg total) by mouth 3 (three) times daily. 05/19/23 05/13/24 Yes Camara, Amalia Hailey, MD  GNP ULTICARE PEN NEEDLES 32G X 4 MM MISC USE WITH insulin ONCE DAILY 12/12/22  Yes Paseda, Baird Kay, FNP  hydrOXYzine (ATARAX) 25 MG tablet Take 1 tablet by mouth every 8 hours as needed for anxiety. 04/10/23  Yes Paseda, Baird Kay, FNP  LANTUS SOLOSTAR 100 UNIT/ML Solostar Pen Inject 65 Units into the skin at bedtime. 04/22/23  Yes Paseda, Baird Kay, FNP  LINZESS 145 MCG CAPS capsule Take 1 capsule (145 mcg total) by mouth every morning. 02/05/23  Yes Paseda, Baird Kay, FNP  losartan-hydrochlorothiazide (HYZAAR) 100-12.5 MG tablet Take 1 tablet by mouth daily. 12/12/22  Yes Paseda, Folashade R, FNP  OZEMPIC, 1 MG/DOSE, 4 MG/3ML SOPN Inject 1 mg below the skin as directed once a week. 04/09/23  Yes Paseda, Baird Kay, FNP  rosuvastatin (CRESTOR) 40 MG tablet Take 1 tablet (40 mg total) by mouth daily. 01/22/23  Yes Paseda, Baird Kay, FNP  tiZANidine (ZANAFLEX) 4 MG tablet Take 1 tablet (4 mg total) by mouth every 8 (eight) hours as needed. 05/22/23  Yes Paseda, Baird Kay, FNP  cyanocobalamin (VITAMIN B12) 1000 MCG tablet Take 1 tablet (1,000 mcg total) by mouth daily. 02/03/23 08/02/23  Windell Norfolk, MD    Family History Family History  Problem Relation Age of Onset   Diabetes Mother    Heart disease Mother    High blood pressure Mother    Hyperlipidemia Mother    Diabetes Mellitus II Father    Heart Problems Brother    Breast cancer Maternal Aunt    Cervical cancer Paternal Aunt     Diabetes Paternal Uncle    Heart disease Paternal Grandmother    Breast cancer Paternal Grandmother    Head & neck cancer Cousin    Colon cancer Neg Hx    Colon polyps Neg Hx    Esophageal cancer Neg Hx    Rectal cancer Neg Hx    Stomach cancer Neg Hx     Social History Social History   Tobacco Use   Smoking status: Every Day    Packs/day: 1.00    Years: 35.00    Additional pack years: 0.00    Total pack years: 35.00    Types: Cigarettes  Smokeless tobacco: Never  Vaping Use   Vaping Use: Never used  Substance Use Topics   Alcohol use: No   Drug use: No     Allergies   Doxycycline and Flagyl [metronidazole]   Review of Systems Review of Systems As per HPI  Physical Exam Triage Vital Signs ED Triage Vitals  Enc Vitals Group     BP 06/04/23 0823 123/83     Pulse Rate 06/04/23 0823 63     Resp 06/04/23 0823 18     Temp 06/04/23 0823 98.9 F (37.2 C)     Temp Source 06/04/23 0823 Oral     SpO2 06/04/23 0823 93 %     Weight --      Height 06/04/23 0823 5\' 7"  (1.702 m)     Head Circumference --      Peak Flow --      Pain Score 06/04/23 0821 8     Pain Loc --      Pain Edu? --      Excl. in GC? --    No data found.  Updated Vital Signs BP 123/83 (BP Location: Left Arm)   Pulse 63   Temp 98.9 F (37.2 C) (Oral)   Resp 18   Ht 5\' 7"  (1.702 m)   SpO2 93%   BMI 35.33 kg/m   Visual Acuity Right Eye Distance:   Left Eye Distance:   Bilateral Distance:    Right Eye Near:   Left Eye Near:    Bilateral Near:     Physical Exam Vitals and nursing note reviewed.  Constitutional:      General: She is not in acute distress.    Appearance: She is well-developed. She is not ill-appearing, toxic-appearing or diaphoretic.  HENT:     Head: Normocephalic and atraumatic.     Salivary Glands: Right salivary gland is not diffusely enlarged or tender. Left salivary gland is not diffusely enlarged or tender.     Right Ear: Ear canal normal. No drainage,  swelling or tenderness. A middle ear effusion (minimal, clear fluid) is present. Tympanic membrane is not injected, scarred, erythematous or bulging.     Left Ear: Tympanic membrane and ear canal normal. No drainage, swelling or tenderness.  No middle ear effusion. Tympanic membrane is not injected, scarred, erythematous or bulging.     Nose: Congestion present. No rhinorrhea.     Right Turbinates: Enlarged.     Right Sinus: No maxillary sinus tenderness or frontal sinus tenderness.     Left Sinus: No maxillary sinus tenderness or frontal sinus tenderness.     Mouth/Throat:     Mouth: Mucous membranes are moist.     Dentition: Has dentures. Gum lesions present.     Pharynx: Uvula midline. No pharyngeal swelling, oropharyngeal exudate, posterior oropharyngeal erythema or uvula swelling.     Tonsils: No tonsillar exudate or tonsillar abscesses.      Comments: Pt wears dentures. Upon removal of the dentures, there is a painful blister to the bottom R gums as annotated above; pain with palpation without appreciable abscess or drainage. Cardiovascular:     Rate and Rhythm: Normal rate and regular rhythm.  Pulmonary:     Effort: Pulmonary effort is normal. No respiratory distress.     Breath sounds: Normal breath sounds. No stridor. No wheezing or rhonchi.  Lymphadenopathy:     Cervical: Cervical adenopathy (significant pain and swelling to R submandibular lymph node, to a lesser degree the R anterior cervical) present.  Skin:    General: Skin is warm and dry.     Coloration: Skin is not jaundiced.     Findings: No bruising, erythema or rash.  Neurological:     General: No focal deficit present.     Mental Status: She is alert and oriented to person, place, and time.     Sensory: No sensory deficit.     Motor: No weakness.      UC Treatments / Results  Labs (all labs ordered are listed, but only abnormal results are displayed) Labs Reviewed - No data to display  EKG   Radiology No  results found.  Procedures Procedures (including critical care time)  Medications Ordered in UC Medications - No data to display  Initial Impression / Assessment and Plan / UC Course  I have reviewed the triage vital signs and the nursing notes.  Pertinent labs & imaging results that were available during my care of the patient were reviewed by me and considered in my medical decision making (see chart for details).     Blister vs abrasion of gums on R - likely secondary to dentures. Will start chlorhexidine mouthwash in addition to Augmentin to cover for mouth flora. No appearance of strep throat or other URI. Pt does also have sx consistent with sinusitis, likely viral, however Augmentin will be beneficial if bacterial. Pt to start flonase and continue her daily zyrtec. Submandibular lymphadenopathy - secondary to #1. Tx as above. RTC precautions reviewed.   Final Clinical Impressions(s) / UC Diagnoses   Final diagnoses:  Blister of gingiva with infection, initial encounter  Submandibular lymphadenopathy     Discharge Instructions      Please start taking the Augmentin twice daily with food.  Take an over-the-counter probiotic or daily yogurt to help prevent yeast infection or GI side effects.  Please also use the chlorhexidine mouthwash.  Swish and spit 10 mL twice daily.  Try to keep in your mouth for at least 30 seconds prior to spitting.  Please remove your dentures when using the mouthwash. Please follow-up with a dentist to help ensure proper fitting dentures to prevent further complications or problems.  Please purchase over-the-counter Flonase or a saline spray to help flush out your sinus passages.  If your symptoms continue or worsen, please return for recheck.     ED Prescriptions     Medication Sig Dispense Auth. Provider   amoxicillin-clavulanate (AUGMENTIN) 875-125 MG tablet Take 1 tablet by mouth every 12 (twelve) hours. 14 tablet Aron Needles L, PA    chlorhexidine (PERIDEX) 0.12 % solution Use as directed 10 mLs in the mouth or throat 2 (two) times daily. 120 mL Luellen Howson L, PA      PDMP not reviewed this encounter.   Maretta Bees, [place] 06/04/23 [phone]

## 2023-06-04 NOTE — Discharge Instructions (Signed)
Please start taking the Augmentin twice daily with food.  Take an over-the-counter probiotic or daily yogurt to help prevent yeast infection or GI side effects.  Please also use the chlorhexidine mouthwash.  Swish and spit 10 mL twice daily.  Try to keep in your mouth for at least 30 seconds prior to spitting.  Please remove your dentures when using the mouthwash. Please follow-up with a dentist to help ensure proper fitting dentures to prevent further complications or problems.  Please purchase over-the-counter Flonase or a saline spray to help flush out your sinus passages.  If your symptoms continue or worsen, please return for recheck.

## 2023-07-09 ENCOUNTER — Other Ambulatory Visit: Payer: Self-pay | Admitting: Nurse Practitioner

## 2023-07-09 ENCOUNTER — Other Ambulatory Visit: Payer: Self-pay | Admitting: Neurology

## 2023-07-09 DIAGNOSIS — E1143 Type 2 diabetes mellitus with diabetic autonomic (poly)neuropathy: Secondary | ICD-10-CM

## 2023-07-24 ENCOUNTER — Telehealth (INDEPENDENT_AMBULATORY_CARE_PROVIDER_SITE_OTHER): Payer: Medicaid Other | Admitting: Nurse Practitioner

## 2023-07-24 ENCOUNTER — Encounter: Payer: Self-pay | Admitting: Nurse Practitioner

## 2023-07-24 DIAGNOSIS — E1142 Type 2 diabetes mellitus with diabetic polyneuropathy: Secondary | ICD-10-CM | POA: Insufficient documentation

## 2023-07-24 DIAGNOSIS — F419 Anxiety disorder, unspecified: Secondary | ICD-10-CM | POA: Diagnosis not present

## 2023-07-24 DIAGNOSIS — Z972 Presence of dental prosthetic device (complete) (partial): Secondary | ICD-10-CM | POA: Diagnosis not present

## 2023-07-24 DIAGNOSIS — E1143 Type 2 diabetes mellitus with diabetic autonomic (poly)neuropathy: Secondary | ICD-10-CM | POA: Diagnosis not present

## 2023-07-24 DIAGNOSIS — M797 Fibromyalgia: Secondary | ICD-10-CM | POA: Diagnosis not present

## 2023-07-24 DIAGNOSIS — I10 Essential (primary) hypertension: Secondary | ICD-10-CM

## 2023-07-24 DIAGNOSIS — Z794 Long term (current) use of insulin: Secondary | ICD-10-CM | POA: Diagnosis not present

## 2023-07-24 DIAGNOSIS — J069 Acute upper respiratory infection, unspecified: Secondary | ICD-10-CM | POA: Diagnosis not present

## 2023-07-24 DIAGNOSIS — M5030 Other cervical disc degeneration, unspecified cervical region: Secondary | ICD-10-CM | POA: Diagnosis not present

## 2023-07-24 DIAGNOSIS — Z973 Presence of spectacles and contact lenses: Secondary | ICD-10-CM | POA: Diagnosis not present

## 2023-07-24 DIAGNOSIS — Z79899 Other long term (current) drug therapy: Secondary | ICD-10-CM | POA: Diagnosis not present

## 2023-07-24 DIAGNOSIS — F329 Major depressive disorder, single episode, unspecified: Secondary | ICD-10-CM | POA: Diagnosis not present

## 2023-07-24 DIAGNOSIS — Z7985 Long-term (current) use of injectable non-insulin antidiabetic drugs: Secondary | ICD-10-CM | POA: Diagnosis not present

## 2023-07-24 DIAGNOSIS — E782 Mixed hyperlipidemia: Secondary | ICD-10-CM

## 2023-07-24 DIAGNOSIS — E1165 Type 2 diabetes mellitus with hyperglycemia: Secondary | ICD-10-CM | POA: Diagnosis not present

## 2023-07-24 DIAGNOSIS — E559 Vitamin D deficiency, unspecified: Secondary | ICD-10-CM | POA: Diagnosis not present

## 2023-07-24 DIAGNOSIS — J302 Other seasonal allergic rhinitis: Secondary | ICD-10-CM

## 2023-07-24 DIAGNOSIS — Z86010 Personal history of colonic polyps: Secondary | ICD-10-CM | POA: Diagnosis not present

## 2023-07-24 MED ORDER — FLUTICASONE PROPIONATE 50 MCG/ACT NA SUSP
2.0000 | Freq: Every day | NASAL | 2 refills | Status: DC
Start: 2023-07-24 — End: 2023-07-24

## 2023-07-24 MED ORDER — FLUTICASONE PROPIONATE 50 MCG/ACT NA SUSP
2.0000 | Freq: Every day | NASAL | 2 refills | Status: DC
Start: 2023-07-24 — End: 2023-10-13

## 2023-07-24 MED ORDER — BENZONATATE 100 MG PO CAPS: 100.0000 mg | ORAL_CAPSULE | Freq: Three times a day (TID) | ORAL | 0 refills | Status: DC | PRN

## 2023-07-24 NOTE — Assessment & Plan Note (Signed)
Continue Zyrtec 10 mg daily, saline nasal spray as needed Flonase nasal spray refilled

## 2023-07-24 NOTE — Progress Notes (Signed)
Virtual Visit via Telephone Note  I connected with Ellen Avery @ on 07/24/23 at 1120am by telephone and verified that I am speaking with the correct person using two identifiers.  I spent 13 minutes on this telehealth encounter  Location: Patient: home Provider: office   I discussed the limitations, risks, security and privacy concerns of performing an evaluation and management service by telephone and the availability of in person appointments. I also discussed with the patient that there may be a patient responsible charge related to this service. The patient expressed understanding and agreed to proceed.   History of Present Illness: Ellen Avery  has a past medical history of Anxiety, Arthritis, Chronic headaches, DDD (degenerative disc disease), cervical, Fibromyalgia, GERD (gastroesophageal reflux disease), History of colon polyps, History of psychosis, Hypercholesteremia, Hypertension, IBS (irritable bowel syndrome), IBS (irritable bowel syndrome), MDD (major depressive disorder), Right sided facial pain, Seasonal allergic rhinitis, Sinus congestion, Type 2 diabetes mellitus treated with insulin (HCC), Vitamin D deficiency, Wears dentures, and Wears glasses.   Patient presents for follow-up for her chronic medical condition.  She is not able to make it to the office today due to complaints of nonproductive cough, nasal congestion, loss of appetite that started 5 days ago.  She denies fever, chills, wheezing, shortness of breath chest pain.  Sometimes has nausea with vomiting.  She denies known sick contacts, she plans to do home COVID test.  I have offered for the patient to come in to the office to be tested but she would rather have the test done at home.  She has been taking OTC sinus relief as needed, Zyrtec 10 mg daily and saline nasal spray as needed.  Diabetic polyneuropathy.  Takes vitamin B12 1000 mg daily, amitriptyline 50 mg daily, duloxetine 60 mg daily, gabapentin 600 mg 3 times  daily.  She continues to have chronic burning pain in her bilateral lower extremities.  She is followed by neurology and has upcoming appointment with them.  She has been taking Ozempic 1 mg once weekly, Lantus 75 units daily for her type 2 diabetes.  No complaint of hypoglycemia.    Observations/Objective: Patient in no acute distress during today's visit  Assessment and Plan: Seasonal allergic rhinitis Continue Zyrtec 10 mg daily, saline nasal spray as needed Flonase nasal spray refilled  Acute URI Tessalon 100 mg 3 times daily as needed for cough ordered She plans to do a home COVID test, encouraged to call the office if test returns positive Patient encouraged to drink at least 64 ounces of water daily to maintain hydration Follow-up in the office if symptoms does not resolve in a week  Uncontrolled type 2 diabetes mellitus with hyperglycemia (HCC) Lab Results  Component Value Date   HGBA1C 7.2 (A) 04/22/2023    Type 2 diabetes mellitus with diabetic autonomic neuropathy, with long-term current use of insulin (HCC) Lab Results  Component Value Date   HGBA1C 7.2 (A) 04/22/2023  Continue Ozempic 1 mg once weekly dose, Lantus 75 units daily Patient counseled on low-carb modified diet Takes Crestor 40 mg daily and fenofibrate for hyperlipidemia Continue duloxetine 60 mg daily, gabapentin 600 mg 3 times daily, amitriptyline 50 mg daily for neuropathy.  Encouraged to maintain close follow-up with neurology Follow-up in the office in 1 month  HLD (hyperlipidemia) Continue Crestor 40 mg daily, fenofibrate 48 mg daily Avoid fatty fried foods Will recheck lipid panel at next visit Lab Results  Component Value Date   CHOL 166 01/21/2023   HDL  56 01/21/2023   LDLCALC 90 01/21/2023   TRIG 110 01/21/2023   CHOLHDL 3.0 01/21/2023     HTN (hypertension) BP Readings from Last 3 Encounters:  06/04/23 123/83  04/22/23 113/63  02/03/23 119/74    Continue  losartan-hydrochlorothiazide 100-12.5 mg 1 tablet daily HTN Controlled . Continue current medications. No changes in management. Discussed DASH diet and dietary sodium restrictions Continue to increase dietary efforts and exercise.      Follow Up Instructions:    I discussed the assessment and treatment plan with the patient. The patient was provided an opportunity to ask questions and all were answered. The patient agreed with the plan and demonstrated an understanding of the instructions.   The patient was advised to call back or seek an in-person evaluation if the symptoms worsen or if the condition fails to improve as anticipated.

## 2023-07-24 NOTE — Assessment & Plan Note (Signed)
Continue Crestor 40 mg daily, fenofibrate 48 mg daily Avoid fatty fried foods Will recheck lipid panel at next visit Lab Results  Component Value Date   CHOL 166 01/21/2023   HDL 56 01/21/2023   LDLCALC 90 01/21/2023   TRIG 110 01/21/2023   CHOLHDL 3.0 01/21/2023

## 2023-07-24 NOTE — Assessment & Plan Note (Signed)
Lab Results  Component Value Date   HGBA1C 7.2 (A) 04/22/2023

## 2023-07-24 NOTE — Patient Instructions (Signed)
1. Acute cough  - benzonatate (TESSALON PERLES) 100 MG capsule; Take 1 capsule (100 mg total) by mouth 3 (three) times daily as needed for cough.  Dispense: 30 capsule; Refill: 0  2. Seasonal allergic rhinitis, unspecified trigger  - fluticasone (FLONASE) 50 MCG/ACT nasal spray; Place 2 sprays into both nostrils daily.  Dispense: 11.1 mL; Refill: 2    It is important that you exercise regularly at least 30 minutes 5 times a week as tolerated  Think about what you will eat, plan ahead. Choose " clean, green, fresh or frozen" over canned, processed or packaged foods which are more sugary, salty and fatty. 70 to 75% of food eaten should be vegetables and fruit. Three meals at set times with snacks allowed between meals, but they must be fruit or vegetables. Aim to eat over a 12 hour period , example 7 am to 7 pm, and STOP after  your last meal of the day. Drink water,generally about 64 ounces per day, no other drink is as healthy. Fruit juice is best enjoyed in a healthy way, by EATING the fruit.  Thanks for choosing [facility] we consider it a privelige to serve you.

## 2023-07-24 NOTE — Assessment & Plan Note (Signed)
Lab Results  Component Value Date   HGBA1C 7.2 (A) 04/22/2023  Continue Ozempic 1 mg once weekly dose, Lantus 75 units daily Patient counseled on low-carb modified diet Takes Crestor 40 mg daily and fenofibrate for hyperlipidemia Continue duloxetine 60 mg daily, gabapentin 600 mg 3 times daily, amitriptyline 50 mg daily for neuropathy.  Encouraged to maintain close follow-up with neurology Follow-up in the office in 1 month

## 2023-07-24 NOTE — Assessment & Plan Note (Signed)
BP Readings from Last 3 Encounters:  06/04/23 123/83  04/22/23 113/63  02/03/23 119/74    Continue losartan-hydrochlorothiazide 100-12.5 mg 1 tablet daily HTN Controlled . Continue current medications. No changes in management. Discussed DASH diet and dietary sodium restrictions Continue to increase dietary efforts and exercise.

## 2023-07-24 NOTE — Assessment & Plan Note (Addendum)
Tessalon 100 mg 3 times daily as needed for cough ordered She plans to do a home COVID test, encouraged to call the office if test returns positive Patient encouraged to drink at least 64 ounces of water daily to maintain hydration Follow-up in the office if symptoms does not resolve in a week

## 2023-07-24 NOTE — Progress Notes (Deleted)
Established Patient Office Visit  Subjective:  Patient ID: Ellen Avery, female    DOB: March 07, 1966  Age: 57 y.o. MRN: [id number]  CC: No chief complaint on file.   HPI Ellen Avery is a 57 y.o. female with past medical history of *** presents for f/u of *** chronic medical conditions.  Not feeling well. Legs burning burning , feels like on fire, back in September    Low appateite, non productive  cough, congestion since 5 days ago .    No sneezing, wheezing, SOB ,fever. OTC sinus relief. Feels nauses , vomiiting.   147/98 99.1   No flonase    B12 1000mg  once a day   Past Medical History:  Diagnosis Date   Anxiety    Arthritis    Chronic headaches    DDD (degenerative disc disease), cervical    Fibromyalgia    GERD (gastroesophageal reflux disease)    History of colon polyps    History of psychosis    Hypercholesteremia    Hypertension    IBS (irritable bowel syndrome)    IBS (irritable bowel syndrome)    MDD (major depressive disorder)    Right sided facial pain    Seasonal allergic rhinitis    Sinus congestion    12-24-2018 per pt no fever   Type 2 diabetes mellitus treated with insulin (HCC)    Vitamin D deficiency    Wears dentures    full upper,  partial lower   Wears glasses     Past Surgical History:  Procedure Laterality Date   ARTERY BIOPSY Right 12/28/2018   Procedure: RIGHT BIOPSY TEMPORAL ARTERY;  Surgeon: Kinsinger, De Blanch, MD;  Location: [facility];  Service: General;  Laterality: Right;   CARDIAC CATHETERIZATION     MULTIPLE TOOTH EXTRACTIONS  04/ 2019    sedation   TOTAL ABDOMINAL HYSTERECTOMY W/ BILATERAL SALPINGOOPHORECTOMY  2009    Family History  Problem Relation Age of Onset   Diabetes Mother    Heart disease Mother    High blood pressure Mother    Hyperlipidemia Mother    Diabetes Mellitus II Father    Heart Problems Brother    Breast cancer Maternal Aunt    Cervical cancer Paternal Aunt    Diabetes  Paternal Uncle    Heart disease Paternal Grandmother    Breast cancer Paternal Grandmother    Head & neck cancer Cousin    Colon cancer Neg Hx    Colon polyps Neg Hx    Esophageal cancer Neg Hx    Rectal cancer Neg Hx    Stomach cancer Neg Hx     Social History   Socioeconomic History   Marital status: Divorced    Spouse name: Not on file   Number of children: 2   Years of education: Not on file   Highest education level: 9th grade  Occupational History   Not on file  Tobacco Use   Smoking status: Every Day    Current packs/day: 1.00    Average packs/day: 1 pack/day for 35.0 years (35.0 ttl pk-yrs)    Types: Cigarettes   Smokeless tobacco: Never  Vaping Use   Vaping status: Never Used  Substance and Sexual Activity   Alcohol use: No   Drug use: No   Sexual activity: Not Currently    Birth control/protection: Surgical  Other Topics Concern   Not on file  Social History Narrative   Lives home alone    Social  Determinants of Health   Financial Resource Strain: High Risk (07/21/2023)   Overall Financial Resource Strain (CARDIA)    Difficulty of Paying Living Expenses: Hard  Food Insecurity: Food Insecurity Present (07/21/2023)   Hunger Vital Sign    Worried About Running Out of Food in the Last Year: Sometimes true    Ran Out of Food in the Last Year: Sometimes true  Transportation Needs: No Transportation Needs (07/21/2023)   PRAPARE - Administrator, Civil Service (Medical): No    Lack of Transportation (Non-Medical): No  Physical Activity: Insufficiently Active (07/21/2023)   Exercise Vital Sign    Days of Exercise per Week: 4 days    Minutes of Exercise per Session: 30 min  Stress: Stress Concern Present (07/21/2023)   [employer] of Occupational Health - Occupational Stress Questionnaire    Feeling of Stress : Very much  Social Connections: Moderately Integrated (07/21/2023)   Social Connection and Isolation Panel [NHANES]    Frequency of  Communication with Friends and Family: More than three times a week    Frequency of Social Gatherings with Friends and Family: Once a week    Attends Religious Services: More than 4 times per year    Active Member of [employer] or Organizations: Yes    Attends Engineer, structural: More than 4 times per year    Marital Status: Divorced  Catering manager Violence: Not on file    Outpatient Medications Prior to Visit  Medication Sig Dispense Refill   ACCU-CHEK AVIVA PLUS test strip Check blood sugar 3-4 times daily 300 each 2   amitriptyline (ELAVIL) 25 MG tablet Take 1 tablet by mouth at bedtime. 30 tablet 3   amoxicillin-clavulanate (AUGMENTIN) 875-125 MG tablet Take 1 tablet by mouth every 12 (twelve) hours. 14 tablet 0   aspirin EC 81 MG tablet Take 81 mg by mouth daily.     cetirizine (ZYRTEC) 10 MG tablet Take 1 tablet (10 mg total) by mouth daily. 90 tablet 0   chlorhexidine (PERIDEX) 0.12 % solution Use as directed 10 mLs in the mouth or throat 2 (two) times daily. 120 mL 0   Cholecalciferol (VITAMIN D3) 3000 units TABS Take 3,000 Units by mouth daily as needed (vitamin d).     cyanocobalamin (VITAMIN B12) 1000 MCG tablet Take 1 tablet (1,000 mcg total) by mouth daily. 90 tablet 1   DULoxetine (CYMBALTA) 60 MG capsule TAKE 1 CAPSULE BY MOUTH ONCE DAILY 90 capsule 1   famotidine (PEPCID) 20 MG tablet Take 1 tablet by mouth every morning. 90 tablet 1   fenofibrate (TRICOR) 48 MG tablet Take 1 tablet (48 mg total) by mouth daily. 90 tablet 1   fluticasone (FLONASE) 50 MCG/ACT nasal spray 2 sprays Once Daily.     gabapentin (NEURONTIN) 600 MG tablet Take 1 tablet (600 mg total) by mouth 3 (three) times daily. 270 tablet 3   GNP ULTICARE PEN NEEDLES 32G X 4 MM MISC USE WITH insulin ONCE DAILY 100 each 2   hydrOXYzine (ATARAX) 25 MG tablet Take 1 tablet by mouth every 8 hours as needed for anxiety. 90 tablet 1   LANTUS SOLOSTAR 100 UNIT/ML Solostar Pen Inject 75 Units into the skin at  bedtime. 15 mL 3   LINZESS 145 MCG CAPS capsule Take 1 capsule (145 mcg total) by mouth every morning. 30 capsule 1   losartan-hydrochlorothiazide (HYZAAR) 100-12.5 MG tablet Take 1 tablet by mouth daily. 90 tablet 1   OZEMPIC, 1 MG/DOSE,  4 MG/3ML SOPN Inject 1 mg below the skin as directed once a week. 3 mL 2   rosuvastatin (CRESTOR) 40 MG tablet Take 1 tablet (40 mg total) by mouth daily. 90 tablet 1   tiZANidine (ZANAFLEX) 4 MG tablet TAKE 1 TABLET BY MOUTH EVERY 8 HOURS AS NEEDED 30 tablet 0   No facility-administered medications prior to visit.    Allergies  Allergen Reactions   Doxycycline Hives and Itching   Flagyl [Metronidazole] Hives and Itching    Benadryl     ROS Review of Systems    Objective:    Physical Exam  There were no vitals taken for this visit. Wt Readings from Last 3 Encounters:  04/22/23 225 lb 9.6 oz (102.3 kg)  02/03/23 220 lb (99.8 kg)  01/21/23 220 lb 9.6 oz (100.1 kg)    Lab Results  Component Value Date   TSH 0.57 03/19/2022   Lab Results  Component Value Date   WBC 7.7 12/24/2022   HGB 13.3 12/24/2022   HCT 41.9 12/24/2022   MCV 87.1 12/24/2022   PLT 371 12/24/2022   Lab Results  Component Value Date   NA 138 12/24/2022   K 3.8 12/24/2022   CO2 25 12/24/2022   GLUCOSE 142 (H) 12/24/2022   BUN 11 12/24/2022   CREATININE 0.86 12/24/2022   BILITOT 0.3 05/13/2022   ALKPHOS 70 05/13/2022   AST 14 (L) 05/13/2022   ALT 14 05/13/2022   PROT 6.6 05/13/2022   ALBUMIN 3.5 05/13/2022   CALCIUM 9.6 12/24/2022   ANIONGAP 9 12/24/2022   EGFR 84 10/22/2022   Lab Results  Component Value Date   CHOL 166 01/21/2023   Lab Results  Component Value Date   HDL 56 01/21/2023   Lab Results  Component Value Date   LDLCALC 90 01/21/2023   Lab Results  Component Value Date   TRIG 110 01/21/2023   Lab Results  Component Value Date   CHOLHDL 3.0 01/21/2023   Lab Results  Component Value Date   HGBA1C 7.2 (A) 04/22/2023       Assessment & Plan:   Problem List Items Addressed This Visit   None   No orders of the defined types were placed in this encounter.   Follow-up: No follow-ups on file.    Donell Beers, FNP

## 2023-08-03 ENCOUNTER — Encounter: Payer: Self-pay | Admitting: Neurology

## 2023-08-14 ENCOUNTER — Ambulatory Visit (INDEPENDENT_AMBULATORY_CARE_PROVIDER_SITE_OTHER): Payer: Medicaid Other | Admitting: Nurse Practitioner

## 2023-08-14 ENCOUNTER — Encounter: Payer: Self-pay | Admitting: Nurse Practitioner

## 2023-08-14 ENCOUNTER — Other Ambulatory Visit: Payer: Self-pay | Admitting: Nurse Practitioner

## 2023-08-14 VITALS — BP 112/78 | HR 75 | Wt 223.4 lb

## 2023-08-14 DIAGNOSIS — Z794 Long term (current) use of insulin: Secondary | ICD-10-CM | POA: Diagnosis not present

## 2023-08-14 DIAGNOSIS — I10 Essential (primary) hypertension: Secondary | ICD-10-CM | POA: Diagnosis not present

## 2023-08-14 DIAGNOSIS — Z0000 Encounter for general adult medical examination without abnormal findings: Secondary | ICD-10-CM | POA: Diagnosis not present

## 2023-08-14 DIAGNOSIS — E785 Hyperlipidemia, unspecified: Secondary | ICD-10-CM | POA: Diagnosis not present

## 2023-08-14 DIAGNOSIS — Z716 Tobacco abuse counseling: Secondary | ICD-10-CM | POA: Diagnosis not present

## 2023-08-14 DIAGNOSIS — Z90710 Acquired absence of both cervix and uterus: Secondary | ICD-10-CM | POA: Diagnosis not present

## 2023-08-14 DIAGNOSIS — E1165 Type 2 diabetes mellitus with hyperglycemia: Secondary | ICD-10-CM

## 2023-08-14 DIAGNOSIS — E1143 Type 2 diabetes mellitus with diabetic autonomic (poly)neuropathy: Secondary | ICD-10-CM | POA: Diagnosis not present

## 2023-08-14 DIAGNOSIS — E669 Obesity, unspecified: Secondary | ICD-10-CM | POA: Diagnosis not present

## 2023-08-14 DIAGNOSIS — E7800 Pure hypercholesterolemia, unspecified: Secondary | ICD-10-CM | POA: Diagnosis not present

## 2023-08-14 DIAGNOSIS — Z1159 Encounter for screening for other viral diseases: Secondary | ICD-10-CM

## 2023-08-14 DIAGNOSIS — Z720 Tobacco use: Secondary | ICD-10-CM

## 2023-08-14 DIAGNOSIS — Z7985 Long-term (current) use of injectable non-insulin antidiabetic drugs: Secondary | ICD-10-CM | POA: Diagnosis not present

## 2023-08-14 DIAGNOSIS — F419 Anxiety disorder, unspecified: Secondary | ICD-10-CM

## 2023-08-14 DIAGNOSIS — M1990 Unspecified osteoarthritis, unspecified site: Secondary | ICD-10-CM | POA: Diagnosis not present

## 2023-08-14 DIAGNOSIS — M5030 Other cervical disc degeneration, unspecified cervical region: Secondary | ICD-10-CM | POA: Diagnosis not present

## 2023-08-14 DIAGNOSIS — Z6834 Body mass index (BMI) 34.0-34.9, adult: Secondary | ICD-10-CM | POA: Diagnosis not present

## 2023-08-14 DIAGNOSIS — E1140 Type 2 diabetes mellitus with diabetic neuropathy, unspecified: Secondary | ICD-10-CM | POA: Diagnosis not present

## 2023-08-14 LAB — POCT GLYCOSYLATED HEMOGLOBIN (HGB A1C): Hemoglobin A1C: 6.9 % — AB (ref 4.0–5.6)

## 2023-08-14 MED ORDER — AMITRIPTYLINE HCL 75 MG PO TABS: 75.0000 mg | ORAL_TABLET | Freq: Every day | ORAL | 1 refills | Status: DC

## 2023-08-14 NOTE — Patient Instructions (Addendum)
Goal for fasting blood sugar ranges from 80 to 120 and 2 hours after any meal or at bedtime should be between 130 to 170.    1. Need for hepatitis C screening test  - Hepatitis C antibody  2. Type 2 diabetes mellitus with diabetic autonomic neuropathy, with long-term current use of insulin (HCC)  - POCT glycosylated hemoglobin (Hb A1C) - Lipid panel - CMP14+EGFR - amitriptyline (ELAVIL) 75 MG tablet; Take 1 tablet (75 mg total) by mouth at bedtime.  Dispense: 90 tablet; Refill: 1    It is important that you exercise regularly at least 30 minutes 5 times a week as tolerated  Think about what you will eat, plan ahead. Choose " clean, green, fresh or frozen" over canned, processed or packaged foods which are more sugary, salty and fatty. 70 to 75% of food eaten should be vegetables and fruit. Three meals at set times with snacks allowed between meals, but they must be fruit or vegetables. Aim to eat over a 12 hour period , example 7 am to 7 pm, and STOP after  your last meal of the day. Drink water,generally about 64 ounces per day, no other drink is as healthy. Fruit juice is best enjoyed in a healthy way, by EATING the fruit.  Thanks for choosing [facility] we consider it a privelige to serve you.

## 2023-08-14 NOTE — Assessment & Plan Note (Signed)
BP Readings from Last 3 Encounters:  08/14/23 112/78  06/04/23 123/83  04/22/23 113/63   Continue losartan-hydrochlorothiazide 100-12.5 mg 1 tablet daily HTN Controlled . Continue current medications. No changes in management. Discussed DASH diet and dietary sodium restrictions Continue to increase dietary efforts and exercise.  CMP today

## 2023-08-14 NOTE — Assessment & Plan Note (Signed)
Up-to-date with mammogram, colon cancer screening, up-to-date with Tdap vaccine, shingles vaccine and flu vaccine  Declined lung cancer screening  1. Need for hepatitis C screening test  - Hepatitis C antibody

## 2023-08-14 NOTE — Assessment & Plan Note (Signed)
Lab Results  Component Value Date   HGBA1C 7.2 (A) 04/22/2023  A1c 6.9 today Patient congratulated on her efforts at getting her diabetes under control Continue current medication CBG goals discussed encouraged to report blood sugar readings consistently less than 70 Checking lipid panel today  Neuropathy She would like to increase amitriptyline to 75 mg daily, continue Cymbalta 60 mg daily, gabapentin 600 mg 3 times daily, alpha lipoic acid 600 mg daily.  Patient encouraged to excessive drowsiness.  She was also encouraged to follow-up with neurology  Follow-up in the office in 4 weeks

## 2023-08-14 NOTE — Telephone Encounter (Signed)
Please advise KH 

## 2023-08-14 NOTE — Assessment & Plan Note (Signed)
Lab Results  Component Value Date   CHOL 166 01/21/2023   HDL 56 01/21/2023   LDLCALC 90 01/21/2023   TRIG 110 01/21/2023   CHOLHDL 3.0 01/21/2023  LDL goal is less than 70 Currently on Crestor 40 mg daily, fenofibrate 48 mg daily for hypertriglyceridemia Checking fasting lipid panel today

## 2023-08-14 NOTE — Progress Notes (Signed)
Complete physical exam  Patient: Ellen Avery   DOB: 02-02-1966   57 y.o. Female  MRN: [id number]  Subjective:    Chief Complaint  Patient presents with   Annual Exam    Ellen Avery is a 57 y.o. female  has a past medical history of Anxiety, Arthritis, Chronic headaches, DDD (degenerative disc disease), cervical, Fibromyalgia, GERD (gastroesophageal reflux disease), History of colon polyps, History of psychosis, Hypercholesteremia, Hypertension, IBS (irritable bowel syndrome), IBS (irritable bowel syndrome), MDD (major depressive disorder), Right sided facial pain, Seasonal allergic rhinitis, Sinus congestion, Type 2 diabetes mellitus treated with insulin (HCC), Vitamin D deficiency, Wears dentures, and Wears glasses. who presents today for a complete physical exam. She reports consuming a low-carb diet, but states that her diet can be better, she does a lot of walking exercises. She generally feels poorly due to her chronic diabetic neuropathy.  Diabetic neuropathy.  Currently on amitriptyline 25-50 mg daily, gabapentin 600 mg 3 times daily, duloxetine 60 mg daily, alpha lipoic acid 600 mg daily.  She is established with neurology but does not want to go back to see them.  States that her diabetic neuropathy can be bad on some days    Tobacco use disorder .smokes 1 pack of cigarettes daily for many years, she denies shortness of breath wheezing cough.  Patient declined lung cancer screening.  Smoking cessation encouraged.  Type 2 diabetes.  Currently on Ozempic 1 mg weekly, Lantus 75 units at bedtime, states that she has been diabetic since her 33s and knows how to manage her medications.  She skips Lantus on some nights depending on her blood sugar reading.  She does not want any changes made to her diabetic medication today.  Has upcoming diabetic eye exam in November  She is up-to-date with mammogram.  Has had a total hysterectomy so no need for Pap smear.    .  Most recent fall  risk assessment:    04/22/2023   10:21 AM  Fall Risk   Falls in the past year? 0  Number falls in past yr: 0  Injury with Fall? 0  Risk for fall due to : No Fall Risks  Follow up Falls evaluation completed     Most recent depression screenings:    08/14/2023   10:30 AM 04/22/2023   10:21 AM  PHQ 2/9 Scores  PHQ - 2 Score 3 6  PHQ- 9 Score 10 15        Patient Care Team: Donell Beers, FNP as PCP - General (Nurse Practitioner) Baldo Daub, MD as PCP - Cardiology (Cardiology)   Outpatient Medications Prior to Visit  Medication Sig Note   ACCU-CHEK AVIVA PLUS test strip Check blood sugar 3-4 times daily    Alpha-Lipoic Acid 600 MG TABS Take 600 mg by mouth daily.    aspirin EC 81 MG tablet Take 81 mg by mouth daily.    cetirizine (ZYRTEC) 10 MG tablet Take 1 tablet (10 mg total) by mouth daily.    Cholecalciferol (VITAMIN D3) 3000 units TABS Take 3,000 Units by mouth daily as needed (vitamin d). 08/14/2023: Daily    cyanocobalamin (VITAMIN B12) 1000 MCG tablet Take 1,000 mcg by mouth daily.    DULoxetine (CYMBALTA) 60 MG capsule TAKE 1 CAPSULE BY MOUTH ONCE DAILY    famotidine (PEPCID) 20 MG tablet Take 1 tablet by mouth every morning.    fenofibrate (TRICOR) 48 MG tablet Take 1 tablet (48 mg total) by mouth daily.  fluticasone (FLONASE) 50 MCG/ACT nasal spray Place 2 sprays into both nostrils daily.    gabapentin (NEURONTIN) 600 MG tablet Take 1 tablet (600 mg total) by mouth 3 (three) times daily.    LANTUS SOLOSTAR 100 UNIT/ML Solostar Pen Inject 75 Units into the skin at bedtime.    LINZESS 145 MCG CAPS capsule Take 1 capsule (145 mcg total) by mouth every morning.    losartan-hydrochlorothiazide (HYZAAR) 100-12.5 MG tablet Take 1 tablet by mouth daily.    rosuvastatin (CRESTOR) 40 MG tablet Take 1 tablet (40 mg total) by mouth daily.    [DISCONTINUED] hydrOXYzine (ATARAX) 25 MG tablet Take 1 tablet by mouth every 8 hours as needed for anxiety.    [DISCONTINUED]  OZEMPIC, 1 MG/DOSE, 4 MG/3ML SOPN Inject 1 mg below the skin as directed once a week.    [DISCONTINUED] tiZANidine (ZANAFLEX) 4 MG tablet TAKE 1 TABLET BY MOUTH EVERY 8 HOURS AS NEEDED    GNP ULTICARE PEN NEEDLES 32G X 4 MM MISC USE WITH insulin ONCE DAILY    [DISCONTINUED] amitriptyline (ELAVIL) 25 MG tablet Take 1 tablet by mouth at bedtime.    [DISCONTINUED] benzonatate (TESSALON PERLES) 100 MG capsule Take 1 capsule (100 mg total) by mouth 3 (three) times daily as needed for cough.    No facility-administered medications prior to visit.    Review of Systems  Constitutional:  Negative for activity change, appetite change, chills, fatigue and fever.  HENT:  Negative for congestion, dental problem, ear discharge, ear pain and hearing loss.   Eyes:  Negative for pain, discharge, redness and itching.  Respiratory:  Negative for cough, chest tightness, shortness of breath and wheezing.   Cardiovascular:  Negative for chest pain, palpitations and leg swelling.  Gastrointestinal:  Negative for abdominal distention, abdominal pain, anal bleeding, blood in stool, constipation, diarrhea, nausea, rectal pain and vomiting.  Endocrine: Negative for cold intolerance, heat intolerance, polydipsia, polyphagia and polyuria.  Genitourinary:  Negative for difficulty urinating, dysuria, flank pain, frequency, hematuria, menstrual problem, pelvic pain and vaginal bleeding.  Musculoskeletal:  Negative for arthralgias, back pain, gait problem, joint swelling and myalgias.  Skin:  Negative for color change, pallor, rash and wound.  Allergic/Immunologic: Negative for immunocompromised state.  Neurological:  Negative for dizziness, tremors, facial asymmetry, weakness and headaches.  Hematological:  Does not bruise/bleed easily.  Psychiatric/Behavioral:  Negative for agitation, behavioral problems, confusion, decreased concentration, hallucinations, self-injury and suicidal ideas.        Objective:     BP  112/78   Pulse 75   Wt 223 lb 6.4 oz (101.3 kg)   SpO2 98%   BMI 34.99 kg/m    Physical Exam Vitals and nursing note reviewed. Exam conducted with a chaperone present.  Constitutional:      General: She is not in acute distress.    Appearance: Normal appearance. She is obese. She is not ill-appearing, toxic-appearing or diaphoretic.  HENT:     Right Ear: Tympanic membrane, ear canal and external ear normal. There is no impacted cerumen.     Left Ear: Tympanic membrane, ear canal and external ear normal. There is no impacted cerumen.     Nose: Nose normal. No congestion or rhinorrhea.     Mouth/Throat:     Mouth: Mucous membranes are moist.     Pharynx: Oropharynx is clear. No oropharyngeal exudate or posterior oropharyngeal erythema.  Eyes:     General: No scleral icterus.       Right eye: No discharge.  Left eye: No discharge.     Extraocular Movements: Extraocular movements intact.     Conjunctiva/sclera: Conjunctivae normal.  Neck:     Vascular: No carotid bruit.  Cardiovascular:     Rate and Rhythm: Normal rate and regular rhythm.     Pulses: Normal pulses.     Heart sounds: Normal heart sounds. No murmur heard.    No friction rub. No gallop.  Pulmonary:     Effort: Pulmonary effort is normal. No respiratory distress.     Breath sounds: Normal breath sounds. No stridor. No wheezing, rhonchi or rales.  Chest:     Chest wall: No tenderness.  Abdominal:     General: Bowel sounds are normal. There is no distension.     Palpations: Abdomen is soft. There is no mass.     Tenderness: There is no abdominal tenderness. There is no right CVA tenderness, left CVA tenderness, guarding or rebound.     Hernia: No hernia is present.  Musculoskeletal:        General: No swelling, tenderness, deformity or signs of injury.     Cervical back: Normal range of motion and neck supple. No rigidity or tenderness.     Right lower leg: No edema.     Left lower leg: No edema.   Lymphadenopathy:     Cervical: No cervical adenopathy.  Skin:    General: Skin is warm and dry.     Capillary Refill: Capillary refill takes less than 2 seconds.     Coloration: Skin is not jaundiced or pale.     Findings: No bruising, erythema, lesion or rash.  Neurological:     Mental Status: She is alert and oriented to person, place, and time.     Cranial Nerves: No cranial nerve deficit.     Sensory: No sensory deficit.     Motor: No weakness.     Coordination: Coordination normal.     Gait: Gait normal.     Deep Tendon Reflexes: Reflexes normal.  Psychiatric:        Mood and Affect: Mood normal.        Behavior: Behavior normal.        Thought Content: Thought content normal.        Judgment: Judgment normal.     No results found for any visits on 08/14/23.     Assessment & Plan:    Routine Health Maintenance and Physical Exam  Immunization History  Administered Date(s) Administered   Covid-19, Mrna,Vaccine(Spikevax)51yrs and older 10/03/2022   Influenza-Unspecified 01/10/2022, 10/03/2022   PFIZER(Purple Top)SARS-COV-2 Vaccination 03/02/2020, 03/23/2020   Pfizer Covid-19 Vaccine Bivalent Booster 84yrs & up 03/30/2021    Health Maintenance  Topic Date Due   Hepatitis C Screening  Never done   Lung Cancer Screening  05/10/2021   HIV Screening  10/23/2023 (Originally 10/15/1981)   Zoster Vaccines- Shingrix (1 of 2) 11/04/2023 (Originally 10/15/2016)   OPHTHALMOLOGY EXAM  10/14/2023   Diabetic kidney evaluation - Urine ACR  10/23/2023   FOOT EXAM  10/23/2023   HEMOGLOBIN A1C  10/23/2023   Diabetic kidney evaluation - eGFR measurement  12/25/2023   MAMMOGRAM  03/05/2025   Colonoscopy  05/28/2028   INFLUENZA VACCINE  Completed   COVID-19 Vaccine  Completed   HPV VACCINES  Aged Out   DTaP/Tdap/Td  Discontinued    Discussed health benefits of physical activity, and encouraged her to engage in regular exercise appropriate for her age and condition.  Problem  List Items Addressed  This Visit       Cardiovascular and Mediastinum   HTN (hypertension)    BP Readings from Last 3 Encounters:  08/14/23 112/78  06/04/23 123/83  04/22/23 113/63   Continue losartan-hydrochlorothiazide 100-12.5 mg 1 tablet daily HTN Controlled . Continue current medications. No changes in management. Discussed DASH diet and dietary sodium restrictions Continue to increase dietary efforts and exercise.  CMP today        Endocrine   Type 2 diabetes mellitus with diabetic autonomic neuropathy, with long-term current use of insulin (HCC)    Lab Results  Component Value Date   HGBA1C 7.2 (A) 04/22/2023  A1c 6.9 today Patient congratulated on her efforts at getting her diabetes under control Continue current medication CBG goals discussed encouraged to report blood sugar readings consistently less than 70 Checking lipid panel today  Neuropathy She would like to increase amitriptyline to 75 mg daily, continue Cymbalta 60 mg daily, gabapentin 600 mg 3 times daily, alpha lipoic acid 600 mg daily.  Patient encouraged to excessive drowsiness.  She was also encouraged to follow-up with neurology  Follow-up in the office in 4 weeks      Relevant Medications   amitriptyline (ELAVIL) 75 MG tablet   Other Relevant Orders   POCT glycosylated hemoglobin (Hb A1C)   Lipid panel   CMP14+EGFR     Other   Dyslipidemia, goal LDL below 70    Lab Results  Component Value Date   CHOL 166 01/21/2023   HDL 56 01/21/2023   LDLCALC 90 01/21/2023   TRIG 110 01/21/2023   CHOLHDL 3.0 01/21/2023  LDL goal is less than 70 Currently on Crestor 40 mg daily, fenofibrate 48 mg daily for hypertriglyceridemia Checking fasting lipid panel today      Tobacco abuse counseling    Smoking cessation encouraged Declined lung cancer screening      Tobacco abuse   Annual physical exam - Primary    Up-to-date with mammogram, colon cancer screening, up-to-date with Tdap vaccine,  shingles vaccine and flu vaccine  Declined lung cancer screening  1. Need for hepatitis C screening test  - Hepatitis C antibody         Other Visit Diagnoses     Need for hepatitis C screening test       Relevant Orders   Hepatitis C antibody      Return in about 4 months (around 12/14/2023) for DM.     Donell Beers, FNP

## 2023-08-14 NOTE — Assessment & Plan Note (Signed)
Smoking cessation encouraged Declined lung cancer screening

## 2023-08-15 ENCOUNTER — Other Ambulatory Visit: Payer: Self-pay | Admitting: Nurse Practitioner

## 2023-08-15 DIAGNOSIS — E785 Hyperlipidemia, unspecified: Secondary | ICD-10-CM

## 2023-08-15 LAB — LIPID PANEL
Chol/HDL Ratio: 2.7 ratio (ref 0.0–4.4)
Cholesterol, Total: 160 mg/dL (ref 100–199)
HDL: 60 mg/dL (ref >39)
LDL Chol Calc (NIH): 79 mg/dL (ref 0–99)
Triglycerides: 122 mg/dL (ref 0–149)
VLDL Cholesterol Cal: 21 mg/dL (ref 5–40)

## 2023-08-15 LAB — CMP14+EGFR
ALT: 11 IU/L (ref 0–32)
AST: 11 IU/L (ref 0–40)
Albumin: 4.4 g/dL (ref 3.8–4.9)
Alkaline Phosphatase: 111 IU/L (ref 44–121)
BUN/Creatinine Ratio: 11 (ref 9–23)
BUN: 10 mg/dL (ref 6–24)
Bilirubin Total: <0.2 mg/dL (ref 0.0–1.2)
CO2: 25 mmol/L (ref 20–29)
Calcium: 10 mg/dL (ref 8.7–10.2)
Chloride: 104 mmol/L (ref 96–106)
Creatinine, Ser: 0.89 mg/dL (ref 0.57–1.00)
Globulin, Total: 2.7 g/dL (ref 1.5–4.5)
Glucose: 107 mg/dL — ABNORMAL HIGH (ref 70–99)
Potassium: 4.1 mmol/L (ref 3.5–5.2)
Sodium: 144 mmol/L (ref 134–144)
Total Protein: 7.1 g/dL (ref 6.0–8.5)
eGFR: 76 mL/min/{1.73_m2} (ref >59)

## 2023-08-15 LAB — HEPATITIS C ANTIBODY: Hep C Virus Ab: NONREACTIVE

## 2023-08-15 MED ORDER — EZETIMIBE 10 MG PO TABS
10.0000 mg | ORAL_TABLET | Freq: Every day | ORAL | 3 refills | Status: DC
Start: 2023-08-15 — End: 2024-03-08

## 2023-08-17 ENCOUNTER — Other Ambulatory Visit: Payer: Self-pay | Admitting: Nurse Practitioner

## 2023-08-17 ENCOUNTER — Other Ambulatory Visit: Payer: Self-pay

## 2023-08-17 MED ORDER — LINZESS 145 MCG PO CAPS: 145.0000 ug | ORAL_CAPSULE | Freq: Every morning | ORAL | 1 refills | Status: DC

## 2023-08-17 MED ORDER — TIZANIDINE HCL 4 MG PO TABS: 4.0000 mg | ORAL_TABLET | Freq: Three times a day (TID) | ORAL | 0 refills | Status: DC | PRN

## 2023-08-17 NOTE — Telephone Encounter (Signed)
Please advise kh 

## 2023-08-19 ENCOUNTER — Ambulatory Visit (INDEPENDENT_AMBULATORY_CARE_PROVIDER_SITE_OTHER): Payer: Medicaid Other | Admitting: Neurology

## 2023-08-19 ENCOUNTER — Other Ambulatory Visit: Payer: Self-pay | Admitting: Neurology

## 2023-08-19 ENCOUNTER — Encounter: Payer: Self-pay | Admitting: Neurology

## 2023-08-19 VITALS — BP 138/83 | HR 77 | Ht 66.5 in | Wt 226.5 lb

## 2023-08-19 DIAGNOSIS — E1142 Type 2 diabetes mellitus with diabetic polyneuropathy: Secondary | ICD-10-CM

## 2023-08-19 DIAGNOSIS — E538 Deficiency of other specified B group vitamins: Secondary | ICD-10-CM

## 2023-08-19 MED ORDER — LIDOCAINE-PRILOCAINE 2.5-2.5 % EX CREA: 1.0000 | TOPICAL_CREAM | CUTANEOUS | 0 refills | Status: DC | PRN

## 2023-08-19 MED ORDER — GABAPENTIN 600 MG PO TABS: 900.0000 mg | ORAL_TABLET | Freq: Three times a day (TID) | ORAL | 3 refills | Status: DC

## 2023-08-19 NOTE — Patient Instructions (Addendum)
Increase Gabapentin to 900 mg TID  EMLA cream to apply in the lower extremities as needed to relieve the pain  If pain not improved, next step in to increase the Duloxetine to 90 mg daily  Continue your other medications  Continue to follow up with PCP  Return in 6 months or sooner if worse

## 2023-08-19 NOTE — Progress Notes (Signed)
[facility]  PATIENT: Ellen Avery DOB: [birth year]  REQUESTING CLINICIAN: Donell Beers, FNP HISTORY FROM: Patient  REASON FOR VISIT: Bilateral lower extremities neuropathy    HISTORICAL  CHIEF COMPLAINT:  Chief Complaint  Patient presents with   Follow-up    Rm13, alone Diabetic polyneuropathy associated with type 2 diabetes mellitus: pt stated she sent mychart msg and never agot a response from the doctor but we added appt. She hasn't been doing well   INTERVAL HISTORY 08/19/2023 Kemya presents today for follow-up, she is alone.  Last visit was in February, at that time we continued her on gabapentin, duloxetine and started her on amitriptyline 25 mg daily.  She reports that her neuropathy is still present and pain has not improved.  Her gabapentin increased to 600 mg 3 times daily, amitriptyline was increased to 75 mg daily but she is still complaining of pain.  One positive note for her is that her hemoglobinA1C has improved and now is 6.9.  She is compliant with her medication.  Denies any falls.  HISTORY OF PRESENT ILLNESS:  This is a 57 year old woman past medical history of diabetes mellitus diagnosed in her 80s, diabetic neuropathy, fibromyalgia, hypertension hyperlipidemia who is presenting with worsening neuropathy.  Patient reports having a long history of diabetic neuropathy, has tried multiple medications including Lyrica, lidocaine cream, lidocaine patch.  Currently she is on duloxetine 60 mg daily and gabapentin 300 mg 3 times daily.  She reported in the past 4 to 5 months she started Ozempic, her A1c went from 11 down to 7 and during this time her neuropathy pain got worse.  She presented to the ED due to worsening pain.  She described the pain as starting as an itch then it would progress to the point that she wanted below skin of. She also does have burning sensation in the thighs Denies any recent falls    OTHER MEDICAL CONDITIONS: Long  history of diabetes mellitus and diabetic neuropathy, fibromyalgia, hypertension, hyperlipidemia    REVIEW OF SYSTEMS: Full 14 system review of systems performed and negative with exception of: As noted in the HPI  ALLERGIES: Allergies  Allergen Reactions   Doxycycline Hives and Itching   Flagyl [Metronidazole] Hives and Itching    Benadryl     HOME MEDICATIONS: Outpatient Medications Prior to Visit  Medication Sig Dispense Refill   ACCU-CHEK AVIVA PLUS test strip Check blood sugar 3-4 times daily 300 each 2   Alpha-Lipoic Acid 600 MG TABS Take 600 mg by mouth daily.     amitriptyline (ELAVIL) 75 MG tablet Take 1 tablet (75 mg total) by mouth at bedtime. 90 tablet 1   aspirin EC 81 MG tablet Take 81 mg by mouth daily.     cetirizine (ZYRTEC) 10 MG tablet Take 1 tablet (10 mg total) by mouth daily. 90 tablet 0   Cholecalciferol (VITAMIN D3) 3000 units TABS Take 3,000 Units by mouth daily as needed (vitamin d).     cyanocobalamin (VITAMIN B12) 1000 MCG tablet Take 1,000 mcg by mouth daily.     DULoxetine (CYMBALTA) 60 MG capsule TAKE 1 CAPSULE BY MOUTH ONCE DAILY 90 capsule 1   ezetimibe (ZETIA) 10 MG tablet Take 1 tablet (10 mg total) by mouth daily. 90 tablet 3   famotidine (PEPCID) 20 MG tablet Take 1 tablet by mouth every morning. 90 tablet 1   fenofibrate (TRICOR) 48 MG tablet Take 1 tablet (48 mg total) by mouth daily. 90 tablet 1  fluticasone (FLONASE) 50 MCG/ACT nasal spray Place 2 sprays into both nostrils daily. 11.1 mL 2   GNP ULTICARE PEN NEEDLES 32G X 4 MM MISC USE WITH insulin ONCE DAILY 100 each 2   hydrOXYzine (ATARAX) 25 MG tablet TAKE 1 TABLET BY MOUTH EVERY 8 HOURS AS NEEDED FOR ANXIETY 90 tablet 1   LANTUS SOLOSTAR 100 UNIT/ML Solostar Pen Inject 75 Units into the skin at bedtime. 15 mL 3   LINZESS 145 MCG CAPS capsule Take 1 capsule (145 mcg total) by mouth every morning. 90 capsule 1   losartan-hydrochlorothiazide (HYZAAR) 100-12.5 MG tablet Take 1 tablet by mouth  daily. 90 tablet 1   OZEMPIC, 1 MG/DOSE, 4 MG/3ML SOPN Inject 1 mg below the skin as directed once a week. 3 mL 2   rosuvastatin (CRESTOR) 40 MG tablet Take 1 tablet (40 mg total) by mouth daily. 90 tablet 1   tiZANidine (ZANAFLEX) 4 MG tablet Take 1 tablet (4 mg total) by mouth every 8 (eight) hours as needed. 30 tablet 0   gabapentin (NEURONTIN) 600 MG tablet Take 1 tablet (600 mg total) by mouth 3 (three) times daily. 270 tablet 3   No facility-administered medications prior to visit.    PAST MEDICAL HISTORY: Past Medical History:  Diagnosis Date   Anxiety    Arthritis    Chronic headaches    DDD (degenerative disc disease), cervical    Fibromyalgia    GERD (gastroesophageal reflux disease)    History of colon polyps    History of psychosis    Hypercholesteremia    Hypertension    IBS (irritable bowel syndrome)    IBS (irritable bowel syndrome)    MDD (major depressive disorder)    Right sided facial pain    Seasonal allergic rhinitis    Sinus congestion    12-24-2018 per pt no fever   Type 2 diabetes mellitus treated with insulin (HCC)    Vitamin D deficiency    Wears dentures    full upper,  partial lower   Wears glasses     PAST SURGICAL HISTORY: Past Surgical History:  Procedure Laterality Date   ARTERY BIOPSY Right 12/28/2018   Procedure: RIGHT BIOPSY TEMPORAL ARTERY;  Surgeon: Kinsinger, De Blanch, MD;  Location: [facility];  Service: General;  Laterality: Right;   CARDIAC CATHETERIZATION     MULTIPLE TOOTH EXTRACTIONS  04/ 2019    sedation   TOTAL ABDOMINAL HYSTERECTOMY W/ BILATERAL SALPINGOOPHORECTOMY  2009    FAMILY HISTORY: Family History  Problem Relation Age of Onset   Diabetes Mother    Heart disease Mother    High blood pressure Mother    Hyperlipidemia Mother    Diabetes Mellitus II Father    Heart Problems Brother    Breast cancer Maternal Aunt    Cervical cancer Paternal Aunt    Diabetes Paternal Uncle    Heart disease  Paternal Grandmother    Breast cancer Paternal Grandmother    Head & neck cancer Cousin    Colon cancer Neg Hx    Colon polyps Neg Hx    Esophageal cancer Neg Hx    Rectal cancer Neg Hx    Stomach cancer Neg Hx     SOCIAL HISTORY: Social History   Socioeconomic History   Marital status: Divorced    Spouse name: Not on file   Number of children: 2   Years of education: Not on file   Highest education level: 9th grade  Occupational History  Not on file  Tobacco Use   Smoking status: Every Day    Current packs/day: 1.00    Average packs/day: 1 pack/day for 35.0 years (35.0 ttl pk-yrs)    Types: Cigarettes   Smokeless tobacco: Never  Vaping Use   Vaping status: Never Used  Substance and Sexual Activity   Alcohol use: No   Drug use: No   Sexual activity: Not Currently    Birth control/protection: Surgical  Other Topics Concern   Not on file  Social History Narrative   Lives home alone    Social Determinants of Health   Financial Resource Strain: High Risk (07/21/2023)   Overall Financial Resource Strain (CARDIA)    Difficulty of Paying Living Expenses: Hard  Food Insecurity: Food Insecurity Present (07/21/2023)   Hunger Vital Sign    Worried About Running Out of Food in the Last Year: Sometimes true    Ran Out of Food in the Last Year: Sometimes true  Transportation Needs: No Transportation Needs (07/21/2023)   PRAPARE - Administrator, Civil Service (Medical): No    Lack of Transportation (Non-Medical): No  Physical Activity: Insufficiently Active (07/21/2023)   Exercise Vital Sign    Days of Exercise per Week: 4 days    Minutes of Exercise per Session: 30 min  Stress: Stress Concern Present (07/21/2023)   [employer] of Occupational Health - Occupational Stress Questionnaire    Feeling of Stress : Very much  Social Connections: Moderately Integrated (07/21/2023)   Social Connection and Isolation Panel [NHANES]    Frequency of Communication with  Friends and Family: More than three times a week    Frequency of Social Gatherings with Friends and Family: Once a week    Attends Religious Services: More than 4 times per year    Active Member of [employer] or Organizations: Yes    Attends Engineer, structural: More than 4 times per year    Marital Status: Divorced  Catering manager Violence: Not on file    PHYSICAL EXAM  GENERAL EXAM/CONSTITUTIONAL: Vitals:  Vitals:   08/19/23 0956  BP: 138/83  Pulse: 77  Weight: 226 lb 8 oz (102.7 kg)  Height: 5' 6.5" (1.689 m)   Body mass index is 36.01 kg/m. Wt Readings from Last 3 Encounters:  08/19/23 226 lb 8 oz (102.7 kg)  08/14/23 223 lb 6.4 oz (101.3 kg)  04/22/23 225 lb 9.6 oz (102.3 kg)   Patient is in no distress; well developed, nourished and groomed; neck is supple  MUSCULOSKELETAL: Gait, strength, tone, movements noted in Neurologic exam below  NEUROLOGIC: MENTAL STATUS:      No data to display         awake, alert, oriented to person, place and time recent and remote memory intact normal attention and concentration language fluent, comprehension intact, naming intact fund of knowledge appropriate  CRANIAL NERVE:  2nd, 3rd, 4th, 6th - Visual fields full to confrontation, extraocular muscles intact, no nystagmus 5th - facial sensation symmetric 7th - facial strength symmetric 8th - hearing intact 9th - palate elevates symmetrically, uvula midline 11th - shoulder shrug symmetric 12th - tongue protrusion midline  MOTOR:  normal bulk and tone, full strength in the BUE, BLE  SENSORY:  Decrease sensation to vibration in feet up to ankles otherwise normal.   COORDINATION:  finger-nose-finger, fine finger movements normal  REFLEXES:  deep tendon reflexes present and symmetric  GAIT/STATION:  normal   DIAGNOSTIC DATA (LABS, IMAGING, TESTING) - I  reviewed patient records, labs, notes, testing and imaging myself where available.  Lab Results   Component Value Date   WBC 7.7 12/24/2022   HGB 13.3 12/24/2022   HCT 41.9 12/24/2022   MCV 87.1 12/24/2022   PLT 371 12/24/2022      Component Value Date/Time   NA 144 08/14/2023 1123   K 4.1 08/14/2023 1123   CL 104 08/14/2023 1123   CO2 25 08/14/2023 1123   GLUCOSE 107 (H) 08/14/2023 1123   GLUCOSE 142 (H) 12/24/2022 1506   BUN 10 08/14/2023 1123   CREATININE 0.89 08/14/2023 1123   CREATININE 0.69 04/15/2021 0000   CALCIUM 10.0 08/14/2023 1123   PROT 7.1 08/14/2023 1123   ALBUMIN 4.4 08/14/2023 1123   AST 11 08/14/2023 1123   ALT 11 08/14/2023 1123   ALKPHOS 111 08/14/2023 1123   BILITOT <0.2 08/14/2023 1123   GFRNONAA >60 12/24/2022 1506   GFRNONAA 99 04/15/2021 0000   GFRAA 114 04/15/2021 0000   Lab Results  Component Value Date   CHOL 160 08/14/2023   HDL 60 08/14/2023   LDLCALC 79 08/14/2023   TRIG 122 08/14/2023   CHOLHDL 2.7 08/14/2023   Lab Results  Component Value Date   HGBA1C 6.9 (A) 08/14/2023   Lab Results  Component Value Date   VITAMINB12 233 03/19/2022   Lab Results  Component Value Date   TSH 0.57 03/19/2022     ASSESSMENT AND PLAN  57 y.o. year old female with medical conditions including fibromyalgia, diabetes neuropathy, insulin-dependent type 2 diabetes mellitus, hypertension who is presenting for follow-up for worsening diabetic peripheral neuropathy.  She is on gabapentin 600 mg 3 times daily, duloxetine 60 mg daily and amitriptyline 75 mg daily.  Plan will be to increase the gabapentin to 900 mg 3 times daily, continue patient on the same dose of duloxetine and amitriptyline.  We will also start her on a lidocaine cream, Emla to use as needed.  In the case that her neuropathy is not improved, the next step is to increase her duloxetine to 90 mg daily.  Advised patient to contact me if her pain is not resolved otherwise I will see her in 6 months for follow-up.    1. Diabetic polyneuropathy associated with type 2 diabetes mellitus  (HCC)   2. Vitamin B12 deficiency       Patient Instructions  Increase Gabapentin to 900 mg TID  EMLA cream to apply in the lower extremities as needed to relieve the pain  If pain not improved, next step in to increase the Duloxetine to 90 mg daily  Continue your other medications  Continue to follow up with PCP  Return in 6 months or sooner if worse   No orders of the defined types were placed in this encounter.   Meds ordered this encounter  Medications   gabapentin (NEURONTIN) 600 MG tablet    Sig: Take 1.5 tablets (900 mg total) by mouth 3 (three) times daily.    Dispense:  270 tablet    Refill:  3   lidocaine-prilocaine (EMLA) cream    Sig: Apply 1 Application topically as needed.    Dispense:  30 g    Refill:  0    Return in about 6 months (around 02/16/2024).   Windell Norfolk, MD 08/19/2023, 1:25 PM  [facility] [address], [address] [place], [place] [address] [phone]

## 2023-08-19 NOTE — Telephone Encounter (Signed)
Dr. Teresa Coombs.  NEED TO KNOW DAY SUPPLY FOR INSURANCE PURPOSE PLEASE  Thanks,  Ellen Avery

## 2023-08-22 ENCOUNTER — Telehealth: Payer: Medicaid Other | Admitting: Family Medicine

## 2023-08-22 DIAGNOSIS — K047 Periapical abscess without sinus: Secondary | ICD-10-CM | POA: Diagnosis not present

## 2023-08-22 MED ORDER — PENICILLIN V POTASSIUM 500 MG PO TABS: 500.0000 mg | ORAL_TABLET | Freq: Three times a day (TID) | ORAL | 0 refills | Status: AC

## 2023-08-22 NOTE — Patient Instructions (Signed)
Dental Abscess  A dental abscess is an infection around a tooth that may involve pain, swelling, and a collection of pus, as well as other symptoms. Treatment is important to help with symptoms and to prevent the infection from spreading. The general types of dental abscesses are: Pulpal abscess. This abscess may form from the inner part of the tooth (pulp). Periodontal abscess. This abscess may form from the gum. What are the causes? This condition is caused by a bacterial infection in or around the tooth. It may result from: Severe tooth decay (cavities). Trauma to the tooth, such as a broken or chipped tooth. What increases the risk? This condition is more likely to develop in males. It is also more likely to develop in people who: Have cavities. Have severe gum disease. Eat sugary snacks between meals. Use tobacco products. Have diabetes. Have a weakened disease-fighting system (immune system). Do not brush and care for their teeth regularly. What are the signs or symptoms? Mild symptoms of this condition include: Tenderness. Bad breath. Fever. A bitter taste in the mouth. Pain in and around the infected tooth. Moderate symptoms of this condition include: Swollen neck glands. Chills. Pus drainage. Swelling and redness around the infected tooth, in the mouth, or in the face. Severe pain in and around the infected tooth. Severe symptoms of this condition include: Difficulty swallowing. Difficulty opening the mouth. Nausea. Vomiting. How is this diagnosed? This condition is diagnosed based on: Your symptoms and your medical and dental history. An examination of the infected tooth. During the exam, your dental care provider may tap on the infected tooth. You may also need to have X-rays taken of the affected area. How is this treated? This condition is treated by getting rid of the infection. This may be done with: Antibiotic medicines. These may be used in certain  situations. Antibacterial mouth rinse. Incision and drainage. This procedure is done by making an incision in the abscess to drain out the pus. Removing pus is the first priority in treating an abscess. A root canal. This may be performed to save the tooth. Your dental care provider accesses the visible part of your tooth (crown) with a drill and removes any infected pulp. Then the space is filled and sealed off. Tooth extraction. The tooth is pulled out if it cannot be saved by other treatment. You may also receive treatment for pain, such as: Acetaminophen or NSAIDs. Gels that contain a numbing medicine. An injection to block the pain near your nerve. Follow these instructions at home: Medicines Take over-the-counter and prescription medicines only as told by your dental care provider. If you were prescribed an antibiotic, take it as told by your dental care provider. Do not stop taking the antibiotic even if you start to feel better. If you were prescribed a gel that contains a numbing medicine, use it exactly as told in the directions. Do not use these gels for children who are younger than 65 years of age. Use an antibacterial mouth rinse as told by your dental care provider. General instructions  Gargle with a mixture of salt and water 3-4 times a day or as needed. To make salt water, completely dissolve -1 tsp (3-6 g) of salt in 1 cup (237 mL) of warm water. Eat a soft diet while your abscess is healing. Drink enough fluid to keep your urine pale yellow. Do not apply heat to the outside of your mouth. Do not use any products that contain nicotine or tobacco. These  products include cigarettes, chewing tobacco, and vaping devices, such as e-cigarettes. If you need help quitting, ask your dental care provider. Keep all follow-up visits. This is important. How is this prevented?  Excellent dental home care, which includes brushing your teeth every morning and night with fluoride  toothpaste. Floss one time each day. Get regularly scheduled dental cleanings. Consider having a dental sealant applied on teeth that have deep grooves to prevent cavities. Drink fluoridated water regularly. This includes most tap water. Check the label on bottled water to see if it contains fluoride. Reduce or eliminate sugary drinks. Eat healthy meals and snacks. Wear a mouth guard or face shield to protect your teeth while playing sports. Contact a health care provider if: Your pain is worse and is not helped by medicine. You have swelling. You see pus around the tooth. You have a fever or chills. Get help right away if: Your symptoms suddenly get worse. You have a very bad headache. You have problems breathing or swallowing. You have trouble opening your mouth. You have swelling in your neck or around your eye. These symptoms may represent a serious problem that is an emergency. Do not wait to see if the symptoms will go away. Get medical help right away. Call your local emergency services (911 in the [place] Do not drive yourself to the hospital. Summary A dental abscess is a collection of pus in or around a tooth that results from an infection. A dental abscess may result from severe tooth decay, trauma to the tooth, or severe gum disease around a tooth. Symptoms include severe pain, swelling, redness, and drainage of pus in and around the infected tooth. The first priority in treating a dental abscess is to drain out the pus. Treatment may also involve removing damage inside the tooth (root canal) or extracting the tooth. This information is not intended to replace advice given to you by your health care provider. Make sure you discuss any questions you have with your health care provider. Document Revised: 01/31/2021 Document Reviewed: 01/31/2021 Elsevier Patient Education  2024 [employer].

## 2023-08-22 NOTE — Progress Notes (Signed)
Virtual Visit Consent   Ellen Avery, you are scheduled for a virtual visit with a [facility] provider today. Just as with appointments in the office, your consent must be obtained to participate. Your consent will be active for this visit and any virtual visit you may have with one of our providers in the next 365 days. If you have a MyChart account, a copy of this consent can be sent to you electronically.  As this is a virtual visit, video technology does not allow for your provider to perform a traditional examination. This may limit your provider's ability to fully assess your condition. If your provider identifies any concerns that need to be evaluated in person or the need to arrange testing (such as labs, EKG, etc.), we will make arrangements to do so. Although advances in technology are sophisticated, we cannot ensure that it will always work on either your end or our end. If the connection with a video visit is poor, the visit may have to be switched to a telephone visit. With either a video or telephone visit, we are not always able to ensure that we have a secure connection.  By engaging in this virtual visit, you consent to the provision of healthcare and authorize for your insurance to be billed (if applicable) for the services provided during this visit. Depending on your insurance coverage, you may receive a charge related to this service.  I need to obtain your verbal consent now. Are you willing to proceed with your visit today? Ellen Avery has provided verbal consent on 08/22/2023 for a virtual visit (video or telephone). Georgana Curio, FNP  Date: 08/22/2023 5:01 PM  Virtual Visit via Video Note   I, Georgana Curio, connected with  Ellen Avery  ([id number], [birth year]) on 08/22/23 at  5:00 PM EDT by a video-enabled telemedicine application and verified that I am speaking with the correct person using two identifiers.  Location: Patient: Virtual Visit Location Patient:  Home Provider: Virtual Visit Location Provider: Home Office   I discussed the limitations of evaluation and management by telemedicine and the availability of in person appointments. The patient expressed understanding and agreed to proceed.    History of Present Illness: Ellen Avery is a 57 y.o. who identifies as a female who was assigned female at birth, and is being seen today for a hole in left lower gum where her dentures rubbed and now infected. No distress. Marland Kitchen  HPI: HPI  Problems:  Patient Active Problem List   Diagnosis Date Noted   Diabetic polyneuropathy associated with type 2 diabetes mellitus (HCC) 07/24/2023   Acute URI 07/24/2023   Annual physical exam 04/22/2023   Type 2 diabetes mellitus with diabetic autonomic neuropathy, with long-term current use of insulin (HCC) 04/22/2023   Seasonal allergic rhinitis 04/22/2023   Tobacco abuse 01/21/2023   Anxiety 01/21/2023   Uncontrolled type 2 diabetes mellitus with hyperglycemia (HCC) 10/22/2022   Tobacco abuse counseling 10/22/2022   Leg pain, bilateral 10/22/2022   Fibromyalgia 10/22/2022   Chest pain in adult 07/20/2019   IDDM (insulin dependent diabetes mellitus) 07/08/2018   HTN (hypertension) 07/08/2018   Dyslipidemia, goal LDL below 70 07/08/2018   Altered mental status 03/02/2016   MDD (major depressive disorder), recurrent episode, moderate (HCC) 03/02/2016   Psychoses (HCC)     Allergies:  Allergies  Allergen Reactions   Doxycycline Hives and Itching   Flagyl [Metronidazole] Hives and Itching    Benadryl    Medications:  Current Outpatient Medications:    ACCU-CHEK AVIVA PLUS test strip, Check blood sugar 3-4 times daily, Disp: 300 each, Rfl: 2   Alpha-Lipoic Acid 600 MG TABS, Take 600 mg by mouth daily., Disp: , Rfl:    amitriptyline (ELAVIL) 75 MG tablet, Take 1 tablet (75 mg total) by mouth at bedtime., Disp: 90 tablet, Rfl: 1   aspirin EC 81 MG tablet, Take 81 mg by mouth daily., Disp: , Rfl:     cetirizine (ZYRTEC) 10 MG tablet, Take 1 tablet (10 mg total) by mouth daily., Disp: 90 tablet, Rfl: 0   Cholecalciferol (VITAMIN D3) 3000 units TABS, Take 3,000 Units by mouth daily as needed (vitamin d)., Disp: , Rfl:    cyanocobalamin (VITAMIN B12) 1000 MCG tablet, Take 1,000 mcg by mouth daily., Disp: , Rfl:    DULoxetine (CYMBALTA) 60 MG capsule, TAKE 1 CAPSULE BY MOUTH ONCE DAILY, Disp: 90 capsule, Rfl: 1   ezetimibe (ZETIA) 10 MG tablet, Take 1 tablet (10 mg total) by mouth daily., Disp: 90 tablet, Rfl: 3   famotidine (PEPCID) 20 MG tablet, Take 1 tablet by mouth every morning., Disp: 90 tablet, Rfl: 1   fenofibrate (TRICOR) 48 MG tablet, Take 1 tablet (48 mg total) by mouth daily., Disp: 90 tablet, Rfl: 1   fluticasone (FLONASE) 50 MCG/ACT nasal spray, Place 2 sprays into both nostrils daily., Disp: 11.1 mL, Rfl: 2   gabapentin (NEURONTIN) 600 MG tablet, Take 1.5 tablets (900 mg total) by mouth 3 (three) times daily., Disp: 270 tablet, Rfl: 3   GNP ULTICARE PEN NEEDLES 32G X 4 MM MISC, USE WITH insulin ONCE DAILY, Disp: 100 each, Rfl: 2   hydrOXYzine (ATARAX) 25 MG tablet, TAKE 1 TABLET BY MOUTH EVERY 8 HOURS AS NEEDED FOR ANXIETY, Disp: 90 tablet, Rfl: 1   LANTUS SOLOSTAR 100 UNIT/ML Solostar Pen, Inject 75 Units into the skin at bedtime., Disp: 15 mL, Rfl: 3   lidocaine-prilocaine (EMLA) cream, Apply topically daily., Disp: 30 g, Rfl: 0   LINZESS 145 MCG CAPS capsule, Take 1 capsule (145 mcg total) by mouth every morning., Disp: 90 capsule, Rfl: 1   losartan-hydrochlorothiazide (HYZAAR) 100-12.5 MG tablet, Take 1 tablet by mouth daily., Disp: 90 tablet, Rfl: 1   OZEMPIC, 1 MG/DOSE, 4 MG/3ML SOPN, Inject 1 mg below the skin as directed once a week., Disp: 3 mL, Rfl: 2   rosuvastatin (CRESTOR) 40 MG tablet, Take 1 tablet (40 mg total) by mouth daily., Disp: 90 tablet, Rfl: 1   tiZANidine (ZANAFLEX) 4 MG tablet, Take 1 tablet (4 mg total) by mouth every 8 (eight) hours as needed., Disp: 30  tablet, Rfl: 0  Observations/Objective: Patient is well-developed, well-nourished in no acute distress.  Resting comfortably  at home.  Head is normocephalic, atraumatic.  No labored breathing.  Speech is clear and coherent with logical content.  Patient is alert and oriented at baseline.  No facial swelling noted.   Assessment and Plan: 1. Dental infection  Warm salt water rinses, ibuprofen or tylenol, follow up with dentist next week regarding ill fitting dentures.   Follow Up Instructions: I discussed the assessment and treatment plan with the patient. The patient was provided an opportunity to ask questions and all were answered. The patient agreed with the plan and demonstrated an understanding of the instructions.  A copy of instructions were sent to the patient via MyChart unless otherwise noted below.     The patient was advised to call back or seek an in-person  evaluation if the symptoms worsen or if the condition fails to improve as anticipated.  Time:  I spent  minutes with the patient via telehealth technology discussing the above problems/concerns.    Georgana Curio, FNP

## 2023-09-02 ENCOUNTER — Encounter: Payer: Self-pay | Admitting: Neurology

## 2023-09-07 NOTE — Telephone Encounter (Signed)
She is not responding to multiple appropriate pain medicines. Please add her to cancellation list to discuss referral for spinal cord stimulator vs. pain clinic.

## 2023-09-10 ENCOUNTER — Telehealth: Payer: Self-pay

## 2023-09-10 NOTE — Telephone Encounter (Signed)
We do not injection for neuropathy and I will look into pain management referral.

## 2023-09-10 NOTE — Telephone Encounter (Signed)
Call to patient to offer visit on 09/14/23 to discuss  pain management and spinal cord stimulation. She is unable to come on 10/7, She is not interested in spinal cord stimulation and request a referral to pain management. She also asked if she could come for an injection today because it is a bad day for her, her pain is in the right leg and she reports severs burning, "feels like someone is holding a torch to my leg". Advised I would send to Dr. Teresa Coombs for review and or recommendations.

## 2023-10-01 ENCOUNTER — Other Ambulatory Visit: Payer: Self-pay | Admitting: Neurology

## 2023-10-01 ENCOUNTER — Encounter: Payer: Self-pay | Admitting: Neurology

## 2023-10-01 DIAGNOSIS — M792 Neuralgia and neuritis, unspecified: Secondary | ICD-10-CM

## 2023-10-01 DIAGNOSIS — M79604 Pain in right leg: Secondary | ICD-10-CM

## 2023-10-01 NOTE — Telephone Encounter (Signed)
Done. Thanks.

## 2023-10-05 ENCOUNTER — Telehealth: Payer: Self-pay | Admitting: Neurology

## 2023-10-05 NOTE — Telephone Encounter (Signed)
Referral for pain clinic fax to [facility]. Phone: [phone], Fax: [phone]

## 2023-10-09 ENCOUNTER — Encounter: Payer: Self-pay | Admitting: Neurology

## 2023-10-12 ENCOUNTER — Other Ambulatory Visit: Payer: Self-pay | Admitting: Nurse Practitioner

## 2023-10-12 ENCOUNTER — Other Ambulatory Visit: Payer: Self-pay

## 2023-10-12 DIAGNOSIS — J302 Other seasonal allergic rhinitis: Secondary | ICD-10-CM

## 2023-10-12 NOTE — Telephone Encounter (Signed)
Please advise Kh

## 2023-10-13 ENCOUNTER — Telehealth: Payer: Medicaid Other | Admitting: Family Medicine

## 2023-10-13 DIAGNOSIS — R519 Headache, unspecified: Secondary | ICD-10-CM

## 2023-10-13 DIAGNOSIS — W19XXXA Unspecified fall, initial encounter: Secondary | ICD-10-CM

## 2023-10-13 MED ORDER — TIZANIDINE HCL 4 MG PO TABS: 4.0000 mg | ORAL_TABLET | Freq: Three times a day (TID) | ORAL | 0 refills | Status: DC | PRN

## 2023-10-13 NOTE — Progress Notes (Signed)
Fell off toilet and hit head hard on counter- reports on going pain and headache along with sinus pressure since the fall.   Visit not performed- she needs to be seen in person given the fall and injury- symptoms post fall.   Patient acknowledged agreement and understanding of the plan.

## 2023-10-14 ENCOUNTER — Other Ambulatory Visit: Payer: Self-pay

## 2023-10-14 ENCOUNTER — Ambulatory Visit (HOSPITAL_COMMUNITY)
Admission: EM | Admit: 2023-10-14 | Discharge: 2023-10-14 | Disposition: A | Discharge status: Home / Self Care | Payer: Medicaid Other | Attending: Family Medicine | Admitting: Family Medicine

## 2023-10-14 ENCOUNTER — Encounter (HOSPITAL_COMMUNITY): Payer: Self-pay | Admitting: *Deleted

## 2023-10-14 ENCOUNTER — Telehealth: Payer: Medicaid Other | Admitting: Family Medicine

## 2023-10-14 DIAGNOSIS — S0990XA Unspecified injury of head, initial encounter: Secondary | ICD-10-CM

## 2023-10-14 DIAGNOSIS — G8929 Other chronic pain: Secondary | ICD-10-CM

## 2023-10-14 DIAGNOSIS — R519 Headache, unspecified: Secondary | ICD-10-CM

## 2023-10-14 MED ORDER — DICLOFENAC SODIUM 75 MG PO TBEC: 75.0000 mg | DELAYED_RELEASE_TABLET | Freq: Two times a day (BID) | ORAL | 0 refills | Status: DC

## 2023-10-14 NOTE — Progress Notes (Signed)
Virtual Visit Consent   [name], you are scheduled for a virtual visit with a [facility] provider today. Just as with appointments in the office, your consent must be obtained to participate. Your consent will be active for this visit and any virtual visit you may have with one of our providers in the next 365 days. If you have a MyChart account, a copy of this consent can be sent to you electronically.  As this is a virtual visit, video technology does not allow for your provider to perform a traditional examination. This may limit your provider's ability to fully assess your condition. If your provider identifies any concerns that need to be evaluated in person or the need to arrange testing (such as labs, EKG, etc.), we will make arrangements to do so. Although advances in technology are sophisticated, we cannot ensure that it will always work on either your end or our end. If the connection with a video visit is poor, the visit may have to be switched to a telephone visit. With either a video or telephone visit, we are not always able to ensure that we have a secure connection.  By engaging in this virtual visit, you consent to the provision of healthcare and authorize for your insurance to be billed (if applicable) for the services provided during this visit. Depending on your insurance coverage, you may receive a charge related to this service.  I need to obtain your verbal consent now. Are you willing to proceed with your visit today? Ellen Avery has provided verbal consent on 10/14/2023 for a virtual visit (video or telephone). Freddy Finner, NP  Date: 10/14/2023 8:52 AM  Virtual Visit via Video Note   I, Freddy Finner, connected with  Ellen Avery  ([id number], [birth year]) on 10/14/23 at  8:30 AM EST by a video-enabled telemedicine application and verified that I am speaking with the correct person using two identifiers.  Location: Patient: Virtual Visit Location Patient:  Home Provider: Virtual Visit Location Provider: Home Office   I discussed the limitations of evaluation and management by telemedicine and the availability of in person appointments. The patient expressed understanding and agreed to proceed.    History of Present Illness: Ellen Avery is a 57 y.o. who identifies as a female who was assigned female at birth, and is being seen today for neuropathy pain.    Of note- She was seen by provider yesterday. Advised yesterday on VV that she was to be seen in person after a Fall- that she reported she was having bad head pain from. VV provider told her that she would need to be seen in person after a Fall when symptoms are reported. She stated she understood. She is on medication for neuropathy and a muscle relaxer PCP orders.    Makes an appt this morning-  Discussed yesterdays visit and her she reports she did not go in person and that she should not have reported the fall, that it has nothing to do with her symptoms. I again stated that she was advised to be seen. Virtually we can not safely clear her. And she did report pain in head day prior. She said, the Fall was last week.   In her comment for tthis mornings VV she asked for muscle relaxers  to help with her neuropathy pain. Provider noted that her PCP sent some in yesterday. And asked patient about this. "She said "and?" do you think I might need something  else? I have been on that same one without relief."  I explain we do not do pain medication and that I was sorry this is all for safety. She said, "is pain not safety?" Provider reported it is, and that was why we needed her seen in person.  advised that she is on all the medications for her condition, and that she would need to talk to her PCP about adjustments or changes. That we were not going to give a new muscle relaxer after a brand new refill was ordered yesterday. She said, " you are just going to give me a hard time today- and hung up."       Med review also noted to see on her list:  Neurontin 900 mg TID, Elavil, cymbalta, Atarax 25 mg TID     Problems:  Patient Active Problem List   Diagnosis Date Noted   Diabetic polyneuropathy associated with type 2 diabetes mellitus (HCC) 07/24/2023   Acute URI 07/24/2023   Annual physical exam 04/22/2023   Type 2 diabetes mellitus with diabetic autonomic neuropathy, with long-term current use of insulin (HCC) 04/22/2023   Seasonal allergic rhinitis 04/22/2023   Tobacco abuse 01/21/2023   Anxiety 01/21/2023   Uncontrolled type 2 diabetes mellitus with hyperglycemia (HCC) 10/22/2022   Tobacco abuse counseling 10/22/2022   Leg pain, bilateral 10/22/2022   Fibromyalgia 10/22/2022   Chest pain in adult 07/20/2019   IDDM (insulin dependent diabetes mellitus) 07/08/2018   HTN (hypertension) 07/08/2018   Dyslipidemia, goal LDL below 70 07/08/2018   Altered mental status 03/02/2016   MDD (major depressive disorder), recurrent episode, moderate (HCC) 03/02/2016   Psychoses (HCC)     Allergies:  Allergies  Allergen Reactions   Doxycycline Hives and Itching   Flagyl [Metronidazole] Hives and Itching    Benadryl    Medications:  Current Outpatient Medications:    ACCU-CHEK AVIVA PLUS test strip, Check blood sugar 3-4 times daily, Disp: 300 each, Rfl: 2   Alpha-Lipoic Acid 600 MG TABS, Take 600 mg by mouth daily., Disp: , Rfl:    amitriptyline (ELAVIL) 75 MG tablet, Take 1 tablet (75 mg total) by mouth at bedtime., Disp: 90 tablet, Rfl: 1   aspirin EC 81 MG tablet, Take 81 mg by mouth daily., Disp: , Rfl:    cetirizine (ZYRTEC) 10 MG tablet, Take 1 tablet (10 mg total) by mouth daily., Disp: 90 tablet, Rfl: 0   Cholecalciferol (VITAMIN D3) 3000 units TABS, Take 3,000 Units by mouth daily as needed (vitamin d)., Disp: , Rfl:    cyanocobalamin (VITAMIN B12) 1000 MCG tablet, Take 1,000 mcg by mouth daily., Disp: , Rfl:    DULoxetine (CYMBALTA) 60 MG capsule, TAKE 1 CAPSULE BY MOUTH ONCE  DAILY, Disp: 90 capsule, Rfl: 1   ezetimibe (ZETIA) 10 MG tablet, Take 1 tablet (10 mg total) by mouth daily., Disp: 90 tablet, Rfl: 3   famotidine (PEPCID) 20 MG tablet, Take 1 tablet by mouth every morning., Disp: 90 tablet, Rfl: 1   fenofibrate (TRICOR) 48 MG tablet, Take 1 tablet (48 mg total) by mouth daily., Disp: 90 tablet, Rfl: 1   fluticasone (FLONASE) 50 MCG/ACT nasal spray, Instill 2 sprays in each nostril daily, Disp: 16 mL, Rfl: 5   gabapentin (NEURONTIN) 600 MG tablet, Take 1.5 tablets (900 mg total) by mouth 3 (three) times daily., Disp: 270 tablet, Rfl: 3   GNP ULTICARE PEN NEEDLES 32G X 4 MM MISC, USE WITH insulin ONCE DAILY, Disp: 100 each, Rfl:  2   hydrOXYzine (ATARAX) 25 MG tablet, TAKE 1 TABLET BY MOUTH EVERY 8 HOURS AS NEEDED FOR ANXIETY, Disp: 90 tablet, Rfl: 1   LANTUS SOLOSTAR 100 UNIT/ML Solostar Pen, Inject 75 Units into the skin at bedtime., Disp: 15 mL, Rfl: 3   LINZESS 145 MCG CAPS capsule, Take 1 capsule (145 mcg total) by mouth every morning., Disp: 90 capsule, Rfl: 1   losartan-hydrochlorothiazide (HYZAAR) 100-12.5 MG tablet, Take 1 tablet by mouth daily., Disp: 90 tablet, Rfl: 1   OZEMPIC, 1 MG/DOSE, 4 MG/3ML SOPN, Inject 1 mg below the skin as directed once a week., Disp: 3 mL, Rfl: 2   rosuvastatin (CRESTOR) 40 MG tablet, Take 1 tablet (40 mg total) by mouth daily., Disp: 90 tablet, Rfl: 1   tiZANidine (ZANAFLEX) 4 MG tablet, Take 1 tablet (4 mg total) by mouth every 8 (eight) hours as needed., Disp: 30 tablet, Rfl: 0  Observations/Objective: Patient is well-developed, well-nourished in no acute distress.  Resting comfortably  at home.  Head is normocephalic, atraumatic.  No labored breathing.  Speech is clear and coherent with logical content.  Patient is alert and oriented at baseline.    Assessment and Plan:  1. Other chronic pain  Patient was encouraged to follow up with her PCP regarding pain. It looks that she was given a pain management  referral.  Will not provide other medications given the medications she is on. She is better managed in person for this condition.      Follow Up Instructions: I discussed the assessment and treatment plan with the patient. The patient was provided an opportunity to ask questions and all were answered. The patient agreed with the plan and demonstrated an understanding of the instructions.  A copy of instructions were sent to the patient via MyChart unless otherwise noted below.     The patient was advised to call back or seek an in-person evaluation if the symptoms worsen or if the condition fails to improve as anticipated.    Freddy Finner, NP

## 2023-10-14 NOTE — ED Triage Notes (Addendum)
Pt reports she fell asleep on Toilet last Thursday and feel and hit her head. Pt reports HA. PT did a virtual visit last week but was told she had to come in for Face to face due to head injury.

## 2023-10-14 NOTE — ED Notes (Signed)
Pt sx's and hx reviewed with E R ,PA . Pt safe to return to lobby .

## 2023-10-14 NOTE — ED Provider Notes (Signed)
[facility]   [id number] 10/14/23 Arrival Time: 9562  ASSESSMENT & PLAN:  1. Acute head injury without loss of consciousness, initial encounter   2. Acute nonintractable headache, unspecified headache type    Declines ED evaluation. Discussed that I cannot r/o a subdural hematoma. She voices understanding. Desires trial of NSAID first. Other than HA, no other red-flag symptoms.  Meds ordered this encounter  Medications   diclofenac (VOLTAREN) 75 MG EC tablet    Sig: Take 1 tablet (75 mg total) by mouth 2 (two) times daily.    Dispense:  14 tablet    Refill:  0   Normal neurological exam. Six days since head injury. Without fever, focal neuro logical deficits, nuchal rigidity, or change in vision. Normal ambulation. No indication for urgent/emergent neurodiagnostic workup at this time. Discussed.  She agrees to trial of NSAID and observation.  Has seen sports medicine before. May f/u with them if not improving on NSAID.  Recommend:  Follow-up Information     [facility].   Specialty: Emergency Medicine Why: If symptoms worsen in any way. Contact information: [address] [place] [address] [phone]                 Discharge Instructions      Do your best to ensure adequate rest. IOften individuals will develop a headache associated with mild nausea in the days or hours after a head injury. This is called a concussion and may require further follow up.  Please seek prompt medical care if: You have: A very bad (severe) headache that is not helped by medicine. Trouble walking or weakness in your arms and legs. Clear or bloody fluid coming from your nose or ears. Changes in your seeing (vision). Jerky movements that you cannot control (seizure). You throw up (vomit). Your symptoms get worse. You lose balance. Your speech is slurred. You pass out. You are sleepier and have  trouble staying awake. The black centers of your eyes (pupils) change in size.  These symptoms may be an emergency. Do not wait to see if the symptoms will go away. Get medical help right away. Call your local emergency services. Do not drive yourself to the hospital.     Reviewed expectations re: course of current medical issues. Questions answered. Outlined signs and symptoms indicating need for more acute intervention. Patient verbalized understanding. After Visit Summary given.   SUBJECTIVE: History from: Patient. Patient is able to give a clear and coherent history.  Ellen Avery is a 57 y.o. female who presents with complaint of a headache after head injury 6 days ago. Reports that she fell asleep on toilet then fell forward striking superior head on wall. Denies amnesia. Now reports on/off superior scalp HA. Denies worsening.  Current headache has not limited normal daily activities. Denies confusion, n/v, slurred speech, vision problems incl double vision, extremity weakness. No head injury reported. Ambulatory without difficulty. No recent travel.   OBJECTIVE:  Vitals:   10/14/23 0955  BP: 123/82  Pulse: 74  Resp: 20  Temp: 99 F (37.2 C)  SpO2: 95%    General appearance: alert; NAD HENT: normocephalic; atraumatic Eyes: PERRLA; EOMI; conjunctivae normal Neck: supple with FROM Lungs: clear to auscultation bilaterally; unlabored respirations Heart: regular rate and rhythm Extremities: no edema; symmetrical with no gross deformities Skin: warm and dry Neurologic: alert; speech is fluent and clear without dysarthria or aphasia; CN 2-12 grossly intact; no facial  droop; normal gait; normal symmetric reflexes; normal extremity strength and sensation throughout; bilateral upper and lower extremity sensation is grossly intact Psychological: alert and cooperative; normal mood and affect    Allergies  Allergen Reactions   Doxycycline Hives and Itching   Flagyl  [Metronidazole] Hives and Itching    Benadryl     Past Medical History:  Diagnosis Date   Anxiety    Arthritis    Chronic headaches    DDD (degenerative disc disease), cervical    Fibromyalgia    GERD (gastroesophageal reflux disease)    History of colon polyps    History of psychosis    Hypercholesteremia    Hypertension    IBS (irritable bowel syndrome)    IBS (irritable bowel syndrome)    MDD (major depressive disorder)    Right sided facial pain    Seasonal allergic rhinitis    Sinus congestion    12-24-2018 per pt no fever   Type 2 diabetes mellitus treated with insulin (HCC)    Vitamin D deficiency    Wears dentures    full upper,  partial lower   Wears glasses    Social History   Socioeconomic History   Marital status: Divorced    Spouse name: Not on file   Number of children: 2   Years of education: Not on file   Highest education level: 9th grade  Occupational History   Not on file  Tobacco Use   Smoking status: Every Day    Current packs/day: 1.00    Average packs/day: 1 pack/day for 35.0 years (35.0 ttl pk-yrs)    Types: Cigarettes   Smokeless tobacco: Never  Vaping Use   Vaping status: Never Used  Substance and Sexual Activity   Alcohol use: No   Drug use: No   Sexual activity: Not Currently    Birth control/protection: Surgical  Other Topics Concern   Not on file  Social History Narrative   Lives home alone    Social Determinants of Health   Financial Resource Strain: High Risk (07/21/2023)   Overall Financial Resource Strain (CARDIA)    Difficulty of Paying Living Expenses: Hard  Food Insecurity: Food Insecurity Present (07/21/2023)   Hunger Vital Sign    Worried About Running Out of Food in the Last Year: Sometimes true    Ran Out of Food in the Last Year: Sometimes true  Transportation Needs: No Transportation Needs (07/21/2023)   PRAPARE - Administrator, Civil Service (Medical): No    Lack of Transportation (Non-Medical):  No  Physical Activity: Insufficiently Active (07/21/2023)   Exercise Vital Sign    Days of Exercise per Week: 4 days    Minutes of Exercise per Session: 30 min  Stress: Stress Concern Present (07/21/2023)   [employer] of Occupational Health - Occupational Stress Questionnaire    Feeling of Stress : Very much  Social Connections: Moderately Integrated (07/21/2023)   Social Connection and Isolation Panel [NHANES]    Frequency of Communication with Friends and Family: More than three times a week    Frequency of Social Gatherings with Friends and Family: Once a week    Attends Religious Services: More than 4 times per year    Active Member of [employer] or Organizations: Yes    Attends Engineer, structural: More than 4 times per year    Marital Status: Divorced  Catering manager Violence: Not on file   Family History  Problem Relation Age of Onset  Diabetes Mother    Heart disease Mother    High blood pressure Mother    Hyperlipidemia Mother    Diabetes Mellitus II Father    Heart Problems Brother    Breast cancer Maternal Aunt    Cervical cancer Paternal Aunt    Diabetes Paternal Uncle    Heart disease Paternal Grandmother    Breast cancer Paternal Grandmother    Head & neck cancer Cousin    Colon cancer Neg Hx    Colon polyps Neg Hx    Esophageal cancer Neg Hx    Rectal cancer Neg Hx    Stomach cancer Neg Hx    Past Surgical History:  Procedure Laterality Date   ARTERY BIOPSY Right 12/28/2018   Procedure: RIGHT BIOPSY TEMPORAL ARTERY;  Surgeon: Kinsinger, De Blanch, MD;  Location: [facility];  Service: General;  Laterality: Right;   CARDIAC CATHETERIZATION     MULTIPLE TOOTH EXTRACTIONS  04/ 2019    sedation   TOTAL ABDOMINAL HYSTERECTOMY W/ BILATERAL SALPINGOOPHORECTOMY  2009      Mardella Layman, MD 10/14/23 1200

## 2023-10-14 NOTE — Discharge Instructions (Signed)
Do your best to ensure adequate rest. IOften individuals will develop a headache associated with mild nausea in the days or hours after a head injury. This is called a concussion and may require further follow up.  Please seek prompt medical care if: You have: A very bad (severe) headache that is not helped by medicine. Trouble walking or weakness in your arms and legs. Clear or bloody fluid coming from your nose or ears. Changes in your seeing (vision). Jerky movements that you cannot control (seizure). You throw up (vomit). Your symptoms get worse. You lose balance. Your speech is slurred. You pass out. You are sleepier and have trouble staying awake. The black centers of your eyes (pupils) change in size.  These symptoms may be an emergency. Do not wait to see if the symptoms will go away. Get medical help right away. Call your local emergency services. Do not drive yourself to the hospital.

## 2023-10-18 ENCOUNTER — Emergency Department (HOSPITAL_COMMUNITY)
Admission: EM | Admit: 2023-10-18 | Discharge: 2023-10-19 | Disposition: A | Discharge status: Home / Self Care | Payer: Medicaid Other | Attending: Emergency Medicine | Admitting: Emergency Medicine

## 2023-10-18 DIAGNOSIS — Z794 Long term (current) use of insulin: Secondary | ICD-10-CM | POA: Insufficient documentation

## 2023-10-18 DIAGNOSIS — W19XXXA Unspecified fall, initial encounter: Secondary | ICD-10-CM

## 2023-10-18 DIAGNOSIS — Z7982 Long term (current) use of aspirin: Secondary | ICD-10-CM | POA: Insufficient documentation

## 2023-10-18 DIAGNOSIS — I10 Essential (primary) hypertension: Secondary | ICD-10-CM | POA: Insufficient documentation

## 2023-10-18 DIAGNOSIS — E119 Type 2 diabetes mellitus without complications: Secondary | ICD-10-CM | POA: Insufficient documentation

## 2023-10-18 DIAGNOSIS — W01198A Fall on same level from slipping, tripping and stumbling with subsequent striking against other object, initial encounter: Secondary | ICD-10-CM | POA: Insufficient documentation

## 2023-10-18 DIAGNOSIS — S0990XA Unspecified injury of head, initial encounter: Secondary | ICD-10-CM | POA: Diagnosis not present

## 2023-10-18 NOTE — ED Triage Notes (Signed)
Pt arrives to ED c/o fall from sitting and hitting front left head. Pt was sitting on toilet, fell asleep and leaned forward hitting ground. Pt not in thinners, no LOC. Pt reports HA. A/O x 4 no deformities or blurry visions.

## 2023-10-19 ENCOUNTER — Other Ambulatory Visit: Payer: Self-pay

## 2023-10-19 NOTE — Discharge Instructions (Signed)
You have been seen today for your complaint of fall, head injury. Home care instructions are as follows:  Have someone monitor you for the next 2 days Follow up with: Your primary care provider regarding your medication regimen Please seek immediate medical care if you develop any of the following symptoms: You have sudden: Headache that is very bad. Vomiting that does not stop. Changes in the size of one of your pupils. Pupils are the black centers of your eyes. Changes in how you see (vision). More confusion or more grumpy moods. You have a seizure. Your symptoms get worse. You have a clear or bloody fluid coming from your nose or ears. At this time there does not appear to be the presence of an emergent medical condition, however there is always the potential for conditions to change. Please read and follow the below instructions.  Do not take your medicine if  develop an itchy rash, swelling in your mouth or lips, or difficulty breathing; call 911 and seek immediate emergency medical attention if this occurs.  You may review your lab tests and imaging results in their entirety on your MyChart account.  Please discuss all results of fully with your primary care provider and other specialist at your follow-up visit.  Note: Portions of this text may have been transcribed using voice recognition software. Every effort was made to ensure accuracy; however, inadvertent computerized transcription errors may still be present.

## 2023-10-19 NOTE — ED Provider Notes (Signed)
[place] EMERGENCY DEPARTMENT AT [facility] Provider Note   CSN: [id number] Arrival date & time: 10/18/23  5284     History  Chief Complaint  Patient presents with   Ellen Avery    Ellen Avery is a 57 y.o. female.  With a history of hypertension, GERD, fibromyalgia, IBS, anxiety, depression, diabetic neuropathy presenting to the ED for evaluation of a minor head injury.  She states she was on the toilet last night when she fell asleep.  She fell forward and hit the left side of her head on a cabinet.  She did not lose consciousness at this time.  Does not take any blood thinners.  She has some mild pain to the left scalp but otherwise denies any symptoms.  She states that she keeps falling asleep because she is on 600 mg of gabapentin 3 times a day for her diabetic neuropathy and believes that this dose is too high.  She specifically denies vision changes, numbness, weakness, tingling, neck pain, seizure-like activity, nausea, vomiting.  She is not anticoagulated.   Fall Associated symptoms include headaches.       Home Medications Prior to Admission medications   Medication Sig Start Date End Date Taking? Authorizing Provider  ACCU-CHEK AVIVA PLUS test strip Check blood sugar 3-4 times daily 12/12/22   Paseda, Phillips Grout R, FNP  Alpha-Lipoic Acid 600 MG TABS Take 600 mg by mouth daily.    [provider]  amitriptyline (ELAVIL) 75 MG tablet Take 1 tablet (75 mg total) by mouth at bedtime. 08/14/23   Donell Beers, FNP  aspirin EC 81 MG tablet Take 81 mg by mouth daily.    [provider]  cetirizine (ZYRTEC) 10 MG tablet Take 1 tablet (10 mg total) by mouth daily. 05/21/23   Donell Beers, FNP  Cholecalciferol (VITAMIN D3) 3000 units TABS Take 3,000 Units by mouth daily as needed (vitamin d).    [provider]  cyanocobalamin (VITAMIN B12) 1000 MCG tablet Take 1,000 mcg by mouth daily.    [provider]  diclofenac (VOLTAREN) 75  MG EC tablet Take 1 tablet (75 mg total) by mouth 2 (two) times daily. 10/14/23   Mardella Layman, MD  DULoxetine (CYMBALTA) 60 MG capsule TAKE 1 CAPSULE BY MOUTH ONCE DAILY 04/10/23   Paseda, Baird Kay, FNP  ezetimibe (ZETIA) 10 MG tablet Take 1 tablet (10 mg total) by mouth daily. 08/15/23 08/14/24  Donell Beers, FNP  famotidine (PEPCID) 20 MG tablet Take 1 tablet by mouth every morning. 07/09/23   Paseda, Baird Kay, FNP  fenofibrate (TRICOR) 48 MG tablet Take 1 tablet (48 mg total) by mouth daily. 02/05/23   Donell Beers, FNP  fluticasone (FLONASE) 50 MCG/ACT nasal spray Instill 2 sprays in each nostril daily 10/13/23   Paseda, Baird Kay, FNP  gabapentin (NEURONTIN) 600 MG tablet Take 1.5 tablets (900 mg total) by mouth 3 (three) times daily. 08/19/23 08/13/24  Windell Norfolk, MD  GNP ULTICARE PEN NEEDLES 32G X 4 MM MISC USE WITH insulin ONCE DAILY 12/12/22   Paseda, Baird Kay, FNP  hydrOXYzine (ATARAX) 25 MG tablet TAKE 1 TABLET BY MOUTH EVERY 8 HOURS AS NEEDED FOR ANXIETY 08/14/23   Paseda, Baird Kay, FNP  LANTUS SOLOSTAR 100 UNIT/ML Solostar Pen Inject 75 Units into the skin at bedtime. 07/09/23   Donell Beers, FNP  LINZESS 145 MCG CAPS capsule Take 1 capsule (145 mcg total) by mouth every morning. 08/17/23   Edwin Dada  R, FNP  losartan-hydrochlorothiazide (HYZAAR) 100-12.5 MG tablet Take 1 tablet by mouth daily. 12/12/22   Paseda, Baird Kay, FNP  OZEMPIC, 1 MG/DOSE, 4 MG/3ML SOPN Inject 1 mg below the skin as directed once a week. 08/14/23   Donell Beers, FNP  rosuvastatin (CRESTOR) 40 MG tablet Take 1 tablet (40 mg total) by mouth daily. 01/22/23   Paseda, Baird Kay, FNP  tiZANidine (ZANAFLEX) 4 MG tablet Take 1 tablet (4 mg total) by mouth every 8 (eight) hours as needed. 10/13/23   Donell Beers, FNP      Allergies    Doxycycline and Flagyl [metronidazole]    Review of Systems   Review of Systems  Neurological:  Positive for headaches.  All other systems  reviewed and are negative.   Physical Exam Updated Vital Signs BP (!) 100/59   Pulse 64   Temp 97.8 F (36.6 C) (Oral)   Resp 19   Ht 5\' 7"  (1.702 m)   Wt 96.6 kg   SpO2 97%   BMI 33.36 kg/m  Physical Exam Vitals and nursing note reviewed.  Constitutional:      General: She is not in acute distress.    Appearance: Normal appearance. She is normal weight. She is not ill-appearing.     Comments: Resting comfortably in bed  HENT:     Head: Normocephalic and atraumatic.     Comments: No raccoon eyes or Battle sign.  Minor tenderness to palpation of the right frontal and parietal scalp without hematoma, laceration or abrasion Eyes:     Comments: No traumatic hyphema  Neck:     Comments: No midline C-spine TTP.  Normal AROM. Pulmonary:     Effort: Pulmonary effort is normal. No respiratory distress.  Abdominal:     General: Abdomen is flat.  Musculoskeletal:        General: Normal range of motion.     Cervical back: Neck supple.  Skin:    General: Skin is warm and dry.  Neurological:     Mental Status: She is alert and oriented to person, place, and time.  Psychiatric:        Mood and Affect: Mood normal.        Behavior: Behavior normal.     ED Results / Procedures / Treatments   Labs (all labs ordered are listed, but only abnormal results are displayed) Labs Reviewed - No data to display  EKG None  Radiology No results found.  Procedures Procedures    Medications Ordered in ED Medications - No data to display  ED Course/ Medical Decision Making/ A&P                                 Medical Decision Making This patient presents to the ED for concern of minor head injury, this involves an extensive number of treatment options, and is a complaint that carries with it a high risk of complications and morbidity.  The differential diagnosis includes concussion, contusion, ICH  My initial workup includes monitoring  Additional history obtained  from: Nursing notes from this visit.  Afebrile, hemodynamically stable.  57 year old female presenting to the ED for evaluation of minor head injury.  This occurred approximately 12 hours prior to my assessment.  Unfortunately patient did have a 9-hour wait prior to being roomed in the emergency department.  She reports minor tenderness of the left frontal and parietal scalp.  She denies  any neurologic complaints.  [place] head CT rule negative.  I had a shared decision-making conversation with the patient regarding imaging.  She declines CT of the head at this time.  Believe this is reasonable and I have low suspicion for ICH or other significant traumatic head injuries.  I recommended patient be monitored by friends or family for at least 48 hours from the time of the trauma.  She is agreeable to this.  She has had no decline in her status throughout her 9-hour stay in the emergency department.  She was given return precautions.  She was encouraged to follow-up with her primary care provider regarding her medication regimen.  Stable at discharge.  At this time there does not appear to be any evidence of an acute emergency medical condition and the patient appears stable for discharge with appropriate outpatient follow up. Diagnosis was discussed with patient who verbalizes understanding of care plan and is agreeable to discharge. I have discussed return precautions with patient who verbalizes understanding. Patient encouraged to follow-up with their PCP within 1 week. All questions answered.  Note: Portions of this report may have been transcribed using voice recognition software. Every effort was made to ensure accuracy; however, inadvertent computerized transcription errors may still be present.        Final Clinical Impression(s) / ED Diagnoses Final diagnoses:  Minor head injury, initial encounter  Fall, initial encounter    Rx / DC Orders ED Discharge Orders     None          Michelle Piper, Cordelia Poche 10/19/23 1610    Vanetta Mulders, MD 10/19/23 [phone]

## 2023-10-25 ENCOUNTER — Telehealth: Payer: Medicaid Other | Admitting: Family

## 2023-10-25 DIAGNOSIS — K047 Periapical abscess without sinus: Secondary | ICD-10-CM

## 2023-10-25 DIAGNOSIS — K0889 Other specified disorders of teeth and supporting structures: Secondary | ICD-10-CM

## 2023-10-25 MED ORDER — AMOXICILLIN-POT CLAVULANATE 875-125 MG PO TABS: 1.0000 | ORAL_TABLET | Freq: Two times a day (BID) | ORAL | 0 refills | Status: DC

## 2023-10-25 NOTE — Progress Notes (Signed)
Virtual Visit Consent   Ellen Avery, you are scheduled for a virtual visit with a [facility] provider today. Just as with appointments in the office, your consent must be obtained to participate. Your consent will be active for this visit and any virtual visit you may have with one of our providers in the next 365 days. If you have a MyChart account, a copy of this consent can be sent to you electronically.  As this is a virtual visit, video technology does not allow for your provider to perform a traditional examination. This may limit your provider's ability to fully assess your condition. If your provider identifies any concerns that need to be evaluated in person or the need to arrange testing (such as labs, EKG, etc.), we will make arrangements to do so. Although advances in technology are sophisticated, we cannot ensure that it will always work on either your end or our end. If the connection with a video visit is poor, the visit may have to be switched to a telephone visit. With either a video or telephone visit, we are not always able to ensure that we have a secure connection.  By engaging in this virtual visit, you consent to the provision of healthcare and authorize for your insurance to be billed (if applicable) for the services provided during this visit. Depending on your insurance coverage, you may receive a charge related to this service.  I need to obtain your verbal consent now. Are you willing to proceed with your visit today? Ellen Avery has provided verbal consent on 10/25/2023 for a virtual visit (video or telephone). Ellen Rodney, FNP  Date: 10/25/2023 6:51 PM  Virtual Visit via Video Note   I, Ellen Avery, connected with  Ellen Avery  ([id number], [birth year]) on 10/25/23 at  7:00 PM EST by a video-enabled telemedicine application and verified that I am speaking with the correct person using two identifiers.  Location: Patient: Virtual Visit Location Patient:  Home Provider: Virtual Visit Location Provider: Home Office   I discussed the limitations of evaluation and management by telemedicine and the availability of in person appointments. The patient expressed understanding and agreed to proceed.    History of Present Illness: Ellen Avery is a 57 y.o. who identifies as a female who was assigned female at birth, and is being seen today for dental pain that started a week ago that has worsen. She currently does not have a dentist and trying to find a placed that takes her insurance. She reports she is having right lower dental pain, gum  swelling. Denies any fever and she is able to eat and drink. Reports aching pain of 7 out 10. She has been using OTC gel without relief.   HPI: HPI  Problems:  Patient Active Problem List   Diagnosis Date Noted   Diabetic polyneuropathy associated with type 2 diabetes mellitus (HCC) 07/24/2023   Acute URI 07/24/2023   Annual physical exam 04/22/2023   Type 2 diabetes mellitus with diabetic autonomic neuropathy, with long-term current use of insulin (HCC) 04/22/2023   Seasonal allergic rhinitis 04/22/2023   Tobacco abuse 01/21/2023   Anxiety 01/21/2023   Uncontrolled type 2 diabetes mellitus with hyperglycemia (HCC) 10/22/2022   Tobacco abuse counseling 10/22/2022   Leg pain, bilateral 10/22/2022   Fibromyalgia 10/22/2022   Chest pain in adult 07/20/2019   IDDM (insulin dependent diabetes mellitus) 07/08/2018   HTN (hypertension) 07/08/2018   Dyslipidemia, goal LDL below 70 07/08/2018  Altered mental status 03/02/2016   MDD (major depressive disorder), recurrent episode, moderate (HCC) 03/02/2016   Psychoses (HCC)     Allergies:  Allergies  Allergen Reactions   Doxycycline Hives and Itching   Flagyl [Metronidazole] Hives and Itching    Benadryl    Medications:  Current Outpatient Medications:    amoxicillin-clavulanate (AUGMENTIN) 875-125 MG tablet, Take 1 tablet by mouth 2 (two) times daily.,  Disp: 20 tablet, Rfl: 0   ACCU-CHEK AVIVA PLUS test strip, Check blood sugar 3-4 times daily, Disp: 300 each, Rfl: 2   Alpha-Lipoic Acid 600 MG TABS, Take 600 mg by mouth daily., Disp: , Rfl:    amitriptyline (ELAVIL) 75 MG tablet, Take 1 tablet (75 mg total) by mouth at bedtime., Disp: 90 tablet, Rfl: 1   aspirin EC 81 MG tablet, Take 81 mg by mouth daily., Disp: , Rfl:    cetirizine (ZYRTEC) 10 MG tablet, Take 1 tablet (10 mg total) by mouth daily., Disp: 90 tablet, Rfl: 0   Cholecalciferol (VITAMIN D3) 3000 units TABS, Take 3,000 Units by mouth daily as needed (vitamin d)., Disp: , Rfl:    cyanocobalamin (VITAMIN B12) 1000 MCG tablet, Take 1,000 mcg by mouth daily., Disp: , Rfl:    diclofenac (VOLTAREN) 75 MG EC tablet, Take 1 tablet (75 mg total) by mouth 2 (two) times daily., Disp: 14 tablet, Rfl: 0   DULoxetine (CYMBALTA) 60 MG capsule, TAKE 1 CAPSULE BY MOUTH ONCE DAILY, Disp: 90 capsule, Rfl: 1   ezetimibe (ZETIA) 10 MG tablet, Take 1 tablet (10 mg total) by mouth daily., Disp: 90 tablet, Rfl: 3   famotidine (PEPCID) 20 MG tablet, Take 1 tablet by mouth every morning., Disp: 90 tablet, Rfl: 1   fenofibrate (TRICOR) 48 MG tablet, Take 1 tablet (48 mg total) by mouth daily., Disp: 90 tablet, Rfl: 1   fluticasone (FLONASE) 50 MCG/ACT nasal spray, Instill 2 sprays in each nostril daily, Disp: 16 mL, Rfl: 5   gabapentin (NEURONTIN) 600 MG tablet, Take 1.5 tablets (900 mg total) by mouth 3 (three) times daily., Disp: 270 tablet, Rfl: 3   GNP ULTICARE PEN NEEDLES 32G X 4 MM MISC, USE WITH insulin ONCE DAILY, Disp: 100 each, Rfl: 2   hydrOXYzine (ATARAX) 25 MG tablet, TAKE 1 TABLET BY MOUTH EVERY 8 HOURS AS NEEDED FOR ANXIETY, Disp: 90 tablet, Rfl: 1   LANTUS SOLOSTAR 100 UNIT/ML Solostar Pen, Inject 75 Units into the skin at bedtime., Disp: 15 mL, Rfl: 3   LINZESS 145 MCG CAPS capsule, Take 1 capsule (145 mcg total) by mouth every morning., Disp: 90 capsule, Rfl: 1   losartan-hydrochlorothiazide  (HYZAAR) 100-12.5 MG tablet, Take 1 tablet by mouth daily., Disp: 90 tablet, Rfl: 1   OZEMPIC, 1 MG/DOSE, 4 MG/3ML SOPN, Inject 1 mg below the skin as directed once a week., Disp: 3 mL, Rfl: 2   rosuvastatin (CRESTOR) 40 MG tablet, Take 1 tablet (40 mg total) by mouth daily., Disp: 90 tablet, Rfl: 1   tiZANidine (ZANAFLEX) 4 MG tablet, Take 1 tablet (4 mg total) by mouth every 8 (eight) hours as needed., Disp: 30 tablet, Rfl: 0  Observations/Objective: Patient is well-developed, well-nourished in no acute distress.  Resting comfortably  at home.  Head is normocephalic, atraumatic.  No labored breathing.  Speech is clear and coherent with logical content.  Patient is alert and oriented at baseline.  Right lower gum swelling and tenderness  Assessment and Plan: 1. Pain, dental - amoxicillin-clavulanate (AUGMENTIN) 875-125 MG  tablet; Take 1 tablet by mouth 2 (two) times daily.  Dispense: 20 tablet; Refill: 0  2. Dental infection - amoxicillin-clavulanate (AUGMENTIN) 875-125 MG tablet; Take 1 tablet by mouth 2 (two) times daily.  Dispense: 20 tablet; Refill: 0  Alternate tylenol and motrin Gargle with warm water and peroxide Follow up with dentist as soon as possible  Start Augmentin   Follow Up Instructions: I discussed the assessment and treatment plan with the patient. The patient was provided an opportunity to ask questions and all were answered. The patient agreed with the plan and demonstrated an understanding of the instructions.  A copy of instructions were sent to the patient via MyChart unless otherwise noted below.     The patient was advised to call back or seek an in-person evaluation if the symptoms worsen or if the condition fails to improve as anticipated.    Ellen Rodney, FNP

## 2023-10-26 ENCOUNTER — Encounter: Payer: Self-pay | Admitting: Neurology

## 2023-10-28 ENCOUNTER — Encounter: Payer: Self-pay | Admitting: Neurology

## 2023-11-09 NOTE — Telephone Encounter (Signed)
Contacted [facility] patient scheduled 11/16/23 at 11:30 am

## 2023-11-16 DIAGNOSIS — M79604 Pain in right leg: Secondary | ICD-10-CM | POA: Diagnosis not present

## 2023-11-16 DIAGNOSIS — Z114 Encounter for screening for human immunodeficiency virus [HIV]: Secondary | ICD-10-CM | POA: Diagnosis not present

## 2023-11-16 DIAGNOSIS — Z79891 Long term (current) use of opiate analgesic: Secondary | ICD-10-CM | POA: Diagnosis not present

## 2023-11-16 DIAGNOSIS — K219 Gastro-esophageal reflux disease without esophagitis: Secondary | ICD-10-CM | POA: Diagnosis not present

## 2023-11-16 DIAGNOSIS — E1142 Type 2 diabetes mellitus with diabetic polyneuropathy: Secondary | ICD-10-CM | POA: Diagnosis not present

## 2023-11-16 DIAGNOSIS — M129 Arthropathy, unspecified: Secondary | ICD-10-CM | POA: Diagnosis not present

## 2023-11-16 DIAGNOSIS — Z131 Encounter for screening for diabetes mellitus: Secondary | ICD-10-CM | POA: Diagnosis not present

## 2023-11-16 DIAGNOSIS — E559 Vitamin D deficiency, unspecified: Secondary | ICD-10-CM | POA: Diagnosis not present

## 2023-11-16 DIAGNOSIS — E7800 Pure hypercholesterolemia, unspecified: Secondary | ICD-10-CM | POA: Diagnosis not present

## 2023-11-16 DIAGNOSIS — Z794 Long term (current) use of insulin: Secondary | ICD-10-CM | POA: Diagnosis not present

## 2023-11-16 DIAGNOSIS — G894 Chronic pain syndrome: Secondary | ICD-10-CM | POA: Diagnosis not present

## 2023-11-16 DIAGNOSIS — Z79899 Other long term (current) drug therapy: Secondary | ICD-10-CM | POA: Diagnosis not present

## 2023-11-16 DIAGNOSIS — M79605 Pain in left leg: Secondary | ICD-10-CM | POA: Diagnosis not present

## 2023-11-16 DIAGNOSIS — Z1159 Encounter for screening for other viral diseases: Secondary | ICD-10-CM | POA: Diagnosis not present

## 2023-11-18 DIAGNOSIS — Z79899 Other long term (current) drug therapy: Secondary | ICD-10-CM | POA: Diagnosis not present

## 2023-11-25 DIAGNOSIS — Z79891 Long term (current) use of opiate analgesic: Secondary | ICD-10-CM | POA: Diagnosis not present

## 2023-11-25 DIAGNOSIS — G894 Chronic pain syndrome: Secondary | ICD-10-CM | POA: Diagnosis not present

## 2023-11-25 DIAGNOSIS — M79605 Pain in left leg: Secondary | ICD-10-CM | POA: Diagnosis not present

## 2023-11-25 DIAGNOSIS — I10 Essential (primary) hypertension: Secondary | ICD-10-CM | POA: Diagnosis not present

## 2023-11-25 DIAGNOSIS — Z79899 Other long term (current) drug therapy: Secondary | ICD-10-CM | POA: Diagnosis not present

## 2023-11-25 DIAGNOSIS — E7800 Pure hypercholesterolemia, unspecified: Secondary | ICD-10-CM | POA: Diagnosis not present

## 2023-11-25 DIAGNOSIS — E559 Vitamin D deficiency, unspecified: Secondary | ICD-10-CM | POA: Diagnosis not present

## 2023-11-25 DIAGNOSIS — E1142 Type 2 diabetes mellitus with diabetic polyneuropathy: Secondary | ICD-10-CM | POA: Diagnosis not present

## 2023-11-25 DIAGNOSIS — M79604 Pain in right leg: Secondary | ICD-10-CM | POA: Diagnosis not present

## 2023-11-25 DIAGNOSIS — K219 Gastro-esophageal reflux disease without esophagitis: Secondary | ICD-10-CM | POA: Diagnosis not present

## 2023-11-26 ENCOUNTER — Telehealth: Payer: Self-pay | Admitting: Nurse Practitioner

## 2023-11-26 ENCOUNTER — Other Ambulatory Visit: Payer: Self-pay | Admitting: Neurology

## 2023-11-26 DIAGNOSIS — M792 Neuralgia and neuritis, unspecified: Secondary | ICD-10-CM

## 2023-11-26 DIAGNOSIS — M79604 Pain in right leg: Secondary | ICD-10-CM

## 2023-11-26 NOTE — Telephone Encounter (Signed)
Copied from CRM [id number]. Topic: Clinical - Prescription Issue >> Nov 26, 2023  4:05 PM Elle L wrote: Reason for CRM: Joy from [facility] states that the patient would prefer her LANTUS SOLOSTAR 100 UNIT/ML Solostar Pen be in vials instead of pens and will need a prescription for syringes.  [facility] - [place], [place] - [address], [place] [address] Phone: [phone]  Fax: [phone]

## 2023-11-27 ENCOUNTER — Other Ambulatory Visit (HOSPITAL_COMMUNITY): Payer: Self-pay | Admitting: Nurse Practitioner

## 2023-11-27 DIAGNOSIS — Z794 Long term (current) use of insulin: Secondary | ICD-10-CM

## 2023-11-27 MED ORDER — LANTUS SOLOSTAR 100 UNIT/ML ~~LOC~~ SOPN: 75.0000 [IU] | PEN_INJECTOR | Freq: Every day | SUBCUTANEOUS | 4 refills | Status: DC

## 2023-11-30 DIAGNOSIS — Z79899 Other long term (current) drug therapy: Secondary | ICD-10-CM | POA: Diagnosis not present

## 2023-12-14 ENCOUNTER — Ambulatory Visit: Payer: Self-pay | Admitting: Nurse Practitioner

## 2023-12-16 ENCOUNTER — Telehealth: Payer: Medicaid Other | Admitting: Family Medicine

## 2023-12-16 DIAGNOSIS — M79604 Pain in right leg: Secondary | ICD-10-CM | POA: Diagnosis not present

## 2023-12-16 DIAGNOSIS — E7800 Pure hypercholesterolemia, unspecified: Secondary | ICD-10-CM | POA: Diagnosis not present

## 2023-12-16 DIAGNOSIS — Z794 Long term (current) use of insulin: Secondary | ICD-10-CM | POA: Diagnosis not present

## 2023-12-16 DIAGNOSIS — E1142 Type 2 diabetes mellitus with diabetic polyneuropathy: Secondary | ICD-10-CM | POA: Diagnosis not present

## 2023-12-16 DIAGNOSIS — F329 Major depressive disorder, single episode, unspecified: Secondary | ICD-10-CM | POA: Diagnosis not present

## 2023-12-16 DIAGNOSIS — I10 Essential (primary) hypertension: Secondary | ICD-10-CM | POA: Diagnosis not present

## 2023-12-16 DIAGNOSIS — K219 Gastro-esophageal reflux disease without esophagitis: Secondary | ICD-10-CM | POA: Diagnosis not present

## 2023-12-16 DIAGNOSIS — Z79899 Other long term (current) drug therapy: Secondary | ICD-10-CM | POA: Diagnosis not present

## 2023-12-16 DIAGNOSIS — G894 Chronic pain syndrome: Secondary | ICD-10-CM | POA: Diagnosis not present

## 2023-12-16 DIAGNOSIS — E559 Vitamin D deficiency, unspecified: Secondary | ICD-10-CM | POA: Diagnosis not present

## 2023-12-16 DIAGNOSIS — J208 Acute bronchitis due to other specified organisms: Secondary | ICD-10-CM

## 2023-12-16 DIAGNOSIS — E6609 Other obesity due to excess calories: Secondary | ICD-10-CM | POA: Diagnosis not present

## 2023-12-16 DIAGNOSIS — Z6834 Body mass index (BMI) 34.0-34.9, adult: Secondary | ICD-10-CM | POA: Diagnosis not present

## 2023-12-16 DIAGNOSIS — M79605 Pain in left leg: Secondary | ICD-10-CM | POA: Diagnosis not present

## 2023-12-16 DIAGNOSIS — G8929 Other chronic pain: Secondary | ICD-10-CM | POA: Diagnosis not present

## 2023-12-16 MED ORDER — PREDNISONE 10 MG (21) PO TBPK: ORAL_TABLET | ORAL | 0 refills | Status: DC

## 2023-12-16 MED ORDER — PROMETHAZINE-DM 6.25-15 MG/5ML PO SYRP: 5.0000 mL | ORAL_SOLUTION | Freq: Four times a day (QID) | ORAL | 0 refills | Status: DC | PRN

## 2023-12-16 MED ORDER — BENZONATATE 100 MG PO CAPS: 100.0000 mg | ORAL_CAPSULE | Freq: Three times a day (TID) | ORAL | 0 refills | Status: DC | PRN

## 2023-12-16 NOTE — Patient Instructions (Signed)
 Ellen Avery, thank you for joining Chiquita CHRISTELLA Barefoot, NP for today's virtual visit.  While this provider is not your primary care provider (PCP), if your PCP is located in our provider database this encounter information will be shared with them immediately following your visit.   A [facility] MyChart account gives you access to today's visit and all your visits, tests, and labs performed at [facility]  click here if you don't have a [facility] MyChart account or go to [email or web address]  Consent: (Patient) Ellen Avery provided verbal consent for this virtual visit at the beginning of the encounter.  Current Medications:  Current Outpatient Medications:    benzonatate  (TESSALON ) 100 MG capsule, Take 1 capsule (100 mg total) by mouth 3 (three) times daily as needed for cough., Disp: 30 capsule, Rfl: 0   predniSONE  (STERAPRED UNI-PAK 21 TAB) 10 MG (21) TBPK tablet, Take as directed, Disp: 21 tablet, Rfl: 0   promethazine -dextromethorphan  (PROMETHAZINE -DM) 6.25-15 MG/5ML syrup, Take 5 mLs by mouth 4 (four) times daily as needed for cough., Disp: 118 mL, Rfl: 0   ACCU-CHEK AVIVA PLUS test strip, Check blood sugar 3-4 times daily, Disp: 300 each, Rfl: 2   Alpha-Lipoic Acid 600 MG TABS, Take 600 mg by mouth daily., Disp: , Rfl:    amitriptyline  (ELAVIL ) 75 MG tablet, Take 1 tablet (75 mg total) by mouth at bedtime., Disp: 90 tablet, Rfl: 1   amoxicillin -clavulanate (AUGMENTIN ) 875-125 MG tablet, Take 1 tablet by mouth 2 (two) times daily., Disp: 20 tablet, Rfl: 0   aspirin  EC 81 MG tablet, Take 81 mg by mouth daily., Disp: , Rfl:    cetirizine  (ZYRTEC ) 10 MG tablet, Take 1 tablet (10 mg total) by mouth daily., Disp: 90 tablet, Rfl: 0   Cholecalciferol (VITAMIN D3) 3000 units TABS, Take 3,000 Units by mouth daily as needed (vitamin d )., Disp: , Rfl:    cyanocobalamin  (VITAMIN B12) 1000 MCG tablet, Take 1,000 mcg by mouth daily., Disp: , Rfl:    diclofenac  (VOLTAREN ) 75 MG EC  tablet, Take 1 tablet (75 mg total) by mouth 2 (two) times daily., Disp: 14 tablet, Rfl: 0   DULoxetine  (CYMBALTA ) 60 MG capsule, TAKE 1 CAPSULE BY MOUTH ONCE DAILY, Disp: 90 capsule, Rfl: 1   ezetimibe  (ZETIA ) 10 MG tablet, Take 1 tablet (10 mg total) by mouth daily., Disp: 90 tablet, Rfl: 3   famotidine  (PEPCID ) 20 MG tablet, Take 1 tablet by mouth every morning., Disp: 90 tablet, Rfl: 1   fenofibrate  (TRICOR ) 48 MG tablet, Take 1 tablet (48 mg total) by mouth daily., Disp: 90 tablet, Rfl: 1   fluticasone  (FLONASE ) 50 MCG/ACT nasal spray, Instill 2 sprays in each nostril daily, Disp: 16 mL, Rfl: 5   gabapentin  (NEURONTIN ) 600 MG tablet, Take 1.5 tablets (900 mg total) by mouth 3 (three) times daily., Disp: 270 tablet, Rfl: 3   GNP ULTICARE PEN NEEDLES 32G X 4 MM MISC, USE WITH insulin  ONCE DAILY, Disp: 100 each, Rfl: 2   hydrOXYzine  (ATARAX ) 25 MG tablet, TAKE 1 TABLET BY MOUTH EVERY 8 HOURS AS NEEDED FOR ANXIETY, Disp: 90 tablet, Rfl: 1   LANTUS  SOLOSTAR 100 UNIT/ML Solostar Pen, Inject 75 Units into the skin at bedtime., Disp: 15 mL, Rfl: 4   LINZESS  145 MCG CAPS capsule, Take 1 capsule (145 mcg total) by mouth every morning., Disp: 90 capsule, Rfl: 1   losartan -hydrochlorothiazide (HYZAAR) 100-12.5 MG tablet, Take 1 tablet by mouth daily., Disp: 90 tablet, Rfl: 1  OZEMPIC , 1 MG/DOSE, 4 MG/3ML SOPN, Inject 1 mg below the skin as directed once a week., Disp: 3 mL, Rfl: 2   rosuvastatin  (CRESTOR ) 40 MG tablet, Take 1 tablet (40 mg total) by mouth daily., Disp: 90 tablet, Rfl: 1   tiZANidine  (ZANAFLEX ) 4 MG tablet, Take 1 tablet (4 mg total) by mouth every 8 (eight) hours as needed., Disp: 30 tablet, Rfl: 0   Medications ordered in this encounter:  Meds ordered this encounter  Medications   predniSONE  (STERAPRED UNI-PAK 21 TAB) 10 MG (21) TBPK tablet    Sig: Take as directed    Dispense:  21 tablet    Refill:  0    Supervising Provider:   LAMPTEY, PHILIP O [[id number]]   benzonatate  (TESSALON )  100 MG capsule    Sig: Take 1 capsule (100 mg total) by mouth 3 (three) times daily as needed for cough.    Dispense:  30 capsule    Refill:  0    Supervising Provider:   LAMPTEY, PHILIP O [[id number]]   promethazine -dextromethorphan  (PROMETHAZINE -DM) 6.25-15 MG/5ML syrup    Sig: Take 5 mLs by mouth 4 (four) times daily as needed for cough.    Dispense:  118 mL    Refill:  0    Supervising Provider:   BLAISE ALEENE KIDD [[id number]]     *If you need refills on other medications prior to your next appointment, please contact your pharmacy*  Follow-Up: Call back or seek an in-person evaluation if the symptoms worsen or if the condition fails to improve as anticipated.  [facility] [phone]  Other Instructions   - Take meds as prescribed - Rest voice - Use a cool mist humidifier especially during the winter months when heat dries out the air. - Use saline nose sprays frequently to help soothe nasal passages if they are drying out. - Stay hydrated by drinking plenty of fluids - Keep thermostat turn down low to prevent drying out which can cause a dry cough.   If you do not improve you will need a follow up visit in person.                  If you have been instructed to have an in-person evaluation today at a local Urgent Care facility, please use the link below. It will take you to a list of all of our available [facility], including address, phone number and hours of operation. Please do not delay care.  [facility]  If you or a family member do not have a primary care provider, use the link below to schedule a visit and establish care. When you choose a [facility] primary care physician or advanced practice provider, you gain a long-term partner in health. Find a Primary Care Provider  Learn more about [facility] in-office and virtual care options: [facility] - Get Care Now

## 2023-12-16 NOTE — Progress Notes (Signed)
 Virtual Visit Consent   YOLAND SCHERR, you are scheduled for a virtual visit with a [facility] provider today. Just as with appointments in the office, your consent must be obtained to participate. Your consent will be active for this visit and any virtual visit you may have with one of our providers in the next 365 days. If you have a MyChart account, a copy of this consent can be sent to you electronically.  As this is a virtual visit, video technology does not allow for your provider to perform a traditional examination. This may limit your provider's ability to fully assess your condition. If your provider identifies any concerns that need to be evaluated in person or the need to arrange testing (such as labs, EKG, etc.), we will make arrangements to do so. Although advances in technology are sophisticated, we cannot ensure that it will always work on either your end or our end. If the connection with a video visit is poor, the visit may have to be switched to a telephone visit. With either a video or telephone visit, we are not always able to ensure that we have a secure connection.  By engaging in this virtual visit, you consent to the provision of healthcare and authorize for your insurance to be billed (if applicable) for the services provided during this visit. Depending on your insurance coverage, you may receive a charge related to this service.  I need to obtain your verbal consent now. Are you willing to proceed with your visit today? Ellen Avery has provided verbal consent on 12/16/2023 for a virtual visit (video or telephone). Chiquita CHRISTELLA Barefoot, NP  Date: 12/16/2023 11:31 AM  Virtual Visit via Video Note   I, Chiquita CHRISTELLA Barefoot, connected with  Ellen Avery  ([id number], 01/28/1966) on 12/16/23 at 11:30 AM EST by a video-enabled telemedicine application and verified that I am speaking with the correct person using two identifiers.  Location: Patient: Virtual Visit Location Patient:  Home Provider: Virtual Visit Location Provider: Home Office   I discussed the limitations of evaluation and management by telemedicine and the availability of in person appointments. The patient expressed understanding and agreed to proceed.    History of Present Illness: Ellen Avery is a 58 y.o. who identifies as a female who was assigned female at birth, and is being seen today for cough  Onset was 7-10 days started after you received the Shingles and PNA started up with congestion and cough that will not stop. Associated symptoms are dry cough, nasal stuffiness and some head congestion.  Modifying factors are Flonase , and season zytrec, robitussin DM  Denies chest pain, shortness of breath, fevers, chills  Exposure to sick contacts- unknown COVID test: neg  Problems:  Patient Active Problem List   Diagnosis Date Noted   Diabetic polyneuropathy associated with type 2 diabetes mellitus (HCC) 07/24/2023   Acute URI 07/24/2023   Annual physical exam 04/22/2023   Type 2 diabetes mellitus with diabetic autonomic neuropathy, with long-term current use of insulin  (HCC) 04/22/2023   Seasonal allergic rhinitis 04/22/2023   Tobacco abuse 01/21/2023   Anxiety 01/21/2023   Uncontrolled type 2 diabetes mellitus with hyperglycemia (HCC) 10/22/2022   Tobacco abuse counseling 10/22/2022   Leg pain, bilateral 10/22/2022   Fibromyalgia 10/22/2022   Chest pain in adult 07/20/2019   IDDM (insulin  dependent diabetes mellitus) 07/08/2018   HTN (hypertension) 07/08/2018   Dyslipidemia, goal LDL below 70 07/08/2018   Altered mental status 03/02/2016  MDD (major depressive disorder), recurrent episode, moderate (HCC) 03/02/2016   Psychoses (HCC)     Allergies:  Allergies  Allergen Reactions   Doxycycline  Hives and Itching   Flagyl [Metronidazole] Hives and Itching    Benadryl     Medications:  Current Outpatient Medications:    ACCU-CHEK AVIVA PLUS test strip, Check blood sugar 3-4 times  daily, Disp: 300 each, Rfl: 2   Alpha-Lipoic Acid 600 MG TABS, Take 600 mg by mouth daily., Disp: , Rfl:    amitriptyline  (ELAVIL ) 75 MG tablet, Take 1 tablet (75 mg total) by mouth at bedtime., Disp: 90 tablet, Rfl: 1   amoxicillin -clavulanate (AUGMENTIN ) 875-125 MG tablet, Take 1 tablet by mouth 2 (two) times daily., Disp: 20 tablet, Rfl: 0   aspirin  EC 81 MG tablet, Take 81 mg by mouth daily., Disp: , Rfl:    cetirizine  (ZYRTEC ) 10 MG tablet, Take 1 tablet (10 mg total) by mouth daily., Disp: 90 tablet, Rfl: 0   Cholecalciferol (VITAMIN D3) 3000 units TABS, Take 3,000 Units by mouth daily as needed (vitamin d )., Disp: , Rfl:    cyanocobalamin  (VITAMIN B12) 1000 MCG tablet, Take 1,000 mcg by mouth daily., Disp: , Rfl:    diclofenac  (VOLTAREN ) 75 MG EC tablet, Take 1 tablet (75 mg total) by mouth 2 (two) times daily., Disp: 14 tablet, Rfl: 0   DULoxetine  (CYMBALTA ) 60 MG capsule, TAKE 1 CAPSULE BY MOUTH ONCE DAILY, Disp: 90 capsule, Rfl: 1   ezetimibe  (ZETIA ) 10 MG tablet, Take 1 tablet (10 mg total) by mouth daily., Disp: 90 tablet, Rfl: 3   famotidine  (PEPCID ) 20 MG tablet, Take 1 tablet by mouth every morning., Disp: 90 tablet, Rfl: 1   fenofibrate  (TRICOR ) 48 MG tablet, Take 1 tablet (48 mg total) by mouth daily., Disp: 90 tablet, Rfl: 1   fluticasone  (FLONASE ) 50 MCG/ACT nasal spray, Instill 2 sprays in each nostril daily, Disp: 16 mL, Rfl: 5   gabapentin  (NEURONTIN ) 600 MG tablet, Take 1.5 tablets (900 mg total) by mouth 3 (three) times daily., Disp: 270 tablet, Rfl: 3   GNP ULTICARE PEN NEEDLES 32G X 4 MM MISC, USE WITH insulin  ONCE DAILY, Disp: 100 each, Rfl: 2   hydrOXYzine  (ATARAX ) 25 MG tablet, TAKE 1 TABLET BY MOUTH EVERY 8 HOURS AS NEEDED FOR ANXIETY, Disp: 90 tablet, Rfl: 1   LANTUS  SOLOSTAR 100 UNIT/ML Solostar Pen, Inject 75 Units into the skin at bedtime., Disp: 15 mL, Rfl: 4   LINZESS  145 MCG CAPS capsule, Take 1 capsule (145 mcg total) by mouth every morning., Disp: 90 capsule,  Rfl: 1   losartan -hydrochlorothiazide (HYZAAR) 100-12.5 MG tablet, Take 1 tablet by mouth daily., Disp: 90 tablet, Rfl: 1   OZEMPIC , 1 MG/DOSE, 4 MG/3ML SOPN, Inject 1 mg below the skin as directed once a week., Disp: 3 mL, Rfl: 2   rosuvastatin  (CRESTOR ) 40 MG tablet, Take 1 tablet (40 mg total) by mouth daily., Disp: 90 tablet, Rfl: 1   tiZANidine  (ZANAFLEX ) 4 MG tablet, Take 1 tablet (4 mg total) by mouth every 8 (eight) hours as needed., Disp: 30 tablet, Rfl: 0  Observations/Objective: Patient is well-developed, well-nourished in no acute distress.  Resting comfortably  at home.  Head is normocephalic, atraumatic.  No labored breathing.  Speech is clear and coherent with logical content.  Patient is alert and oriented at baseline.  Cough present    Assessment and Plan:  1. Viral Bronchitis  - predniSONE  (STERAPRED UNI-PAK 21 TAB) 10 MG (21) TBPK tablet;  Take as directed  Dispense: 21 tablet; Refill: 0 - benzonatate  (TESSALON ) 100 MG capsule; Take 1 capsule (100 mg total) by mouth 3 (three) times daily as needed for cough.  Dispense: 30 capsule; Refill: 0 - promethazine -dextromethorphan  (PROMETHAZINE -DM) 6.25-15 MG/5ML syrup; Take 5 mLs by mouth 4 (four) times daily as needed for cough.  Dispense: 118 mL; Refill: 0  - Take meds as prescribed - Rest voice - Use a cool mist humidifier especially during the winter months when heat dries out the air. - Use saline nose sprays frequently to help soothe nasal passages if they are drying out. - Stay hydrated by drinking plenty of fluids - Keep thermostat turn down low to prevent drying out which can cause a dry cough.   If you do not improve you will need a follow up visit in person.               Reviewed side effects, risks and benefits of medication.    Patient acknowledged agreement and understanding of the plan.   Past Medical, Surgical, Social History, Allergies, and Medications have been Reviewed.    Follow Up  Instructions: I discussed the assessment and treatment plan with the patient. The patient was provided an opportunity to ask questions and all were answered. The patient agreed with the plan and demonstrated an understanding of the instructions.  A copy of instructions were sent to the patient via MyChart unless otherwise noted below.    The patient was advised to call back or seek an in-person evaluation if the symptoms worsen or if the condition fails to improve as anticipated.    Chiquita CHRISTELLA Barefoot, NP

## 2023-12-18 DIAGNOSIS — Z79899 Other long term (current) drug therapy: Secondary | ICD-10-CM | POA: Diagnosis not present

## 2024-01-05 ENCOUNTER — Other Ambulatory Visit: Payer: Self-pay | Admitting: Nurse Practitioner

## 2024-01-05 MED ORDER — TIZANIDINE HCL 4 MG PO TABS: 4.0000 mg | ORAL_TABLET | Freq: Three times a day (TID) | ORAL | 0 refills | Status: DC | PRN

## 2024-01-07 ENCOUNTER — Other Ambulatory Visit: Payer: Self-pay | Admitting: Nurse Practitioner

## 2024-01-07 DIAGNOSIS — E1165 Type 2 diabetes mellitus with hyperglycemia: Secondary | ICD-10-CM

## 2024-01-07 DIAGNOSIS — J302 Other seasonal allergic rhinitis: Secondary | ICD-10-CM

## 2024-01-08 ENCOUNTER — Ambulatory Visit: Payer: Self-pay | Admitting: Nurse Practitioner

## 2024-01-08 NOTE — Telephone Encounter (Signed)
 Please advise [facility]

## 2024-01-15 DIAGNOSIS — Z79899 Other long term (current) drug therapy: Secondary | ICD-10-CM | POA: Diagnosis not present

## 2024-01-15 DIAGNOSIS — M79604 Pain in right leg: Secondary | ICD-10-CM | POA: Diagnosis not present

## 2024-01-15 DIAGNOSIS — G894 Chronic pain syndrome: Secondary | ICD-10-CM | POA: Diagnosis not present

## 2024-01-15 DIAGNOSIS — E6609 Other obesity due to excess calories: Secondary | ICD-10-CM | POA: Diagnosis not present

## 2024-01-15 DIAGNOSIS — E1142 Type 2 diabetes mellitus with diabetic polyneuropathy: Secondary | ICD-10-CM | POA: Diagnosis not present

## 2024-01-15 DIAGNOSIS — Z6834 Body mass index (BMI) 34.0-34.9, adult: Secondary | ICD-10-CM | POA: Diagnosis not present

## 2024-01-15 DIAGNOSIS — G8929 Other chronic pain: Secondary | ICD-10-CM | POA: Diagnosis not present

## 2024-01-15 DIAGNOSIS — M79605 Pain in left leg: Secondary | ICD-10-CM | POA: Diagnosis not present

## 2024-01-15 DIAGNOSIS — E559 Vitamin D deficiency, unspecified: Secondary | ICD-10-CM | POA: Diagnosis not present

## 2024-01-20 DIAGNOSIS — Z79899 Other long term (current) drug therapy: Secondary | ICD-10-CM | POA: Diagnosis not present

## 2024-02-08 ENCOUNTER — Encounter: Payer: Self-pay | Admitting: Neurology

## 2024-02-11 ENCOUNTER — Other Ambulatory Visit: Payer: Self-pay | Admitting: Nurse Practitioner

## 2024-02-11 DIAGNOSIS — Z0000 Encounter for general adult medical examination without abnormal findings: Secondary | ICD-10-CM

## 2024-02-17 ENCOUNTER — Encounter

## 2024-02-17 ENCOUNTER — Ambulatory Visit: Payer: Medicaid Other | Admitting: Neurology

## 2024-02-17 DIAGNOSIS — E1142 Type 2 diabetes mellitus with diabetic polyneuropathy: Secondary | ICD-10-CM | POA: Diagnosis not present

## 2024-02-17 DIAGNOSIS — Z79899 Other long term (current) drug therapy: Secondary | ICD-10-CM | POA: Diagnosis not present

## 2024-02-17 DIAGNOSIS — Z20822 Contact with and (suspected) exposure to covid-19: Secondary | ICD-10-CM | POA: Diagnosis not present

## 2024-02-17 DIAGNOSIS — Z6834 Body mass index (BMI) 34.0-34.9, adult: Secondary | ICD-10-CM | POA: Diagnosis not present

## 2024-02-17 DIAGNOSIS — J3089 Other allergic rhinitis: Secondary | ICD-10-CM | POA: Diagnosis not present

## 2024-02-17 DIAGNOSIS — M79604 Pain in right leg: Secondary | ICD-10-CM | POA: Diagnosis not present

## 2024-02-17 DIAGNOSIS — M79605 Pain in left leg: Secondary | ICD-10-CM | POA: Diagnosis not present

## 2024-02-17 DIAGNOSIS — E559 Vitamin D deficiency, unspecified: Secondary | ICD-10-CM | POA: Diagnosis not present

## 2024-02-17 DIAGNOSIS — E6609 Other obesity due to excess calories: Secondary | ICD-10-CM | POA: Diagnosis not present

## 2024-02-17 DIAGNOSIS — G894 Chronic pain syndrome: Secondary | ICD-10-CM | POA: Diagnosis not present

## 2024-02-25 ENCOUNTER — Telehealth: Admitting: Physician Assistant

## 2024-02-25 DIAGNOSIS — J019 Acute sinusitis, unspecified: Secondary | ICD-10-CM | POA: Diagnosis not present

## 2024-02-25 DIAGNOSIS — B9689 Other specified bacterial agents as the cause of diseases classified elsewhere: Secondary | ICD-10-CM

## 2024-02-25 MED ORDER — AMOXICILLIN-POT CLAVULANATE 875-125 MG PO TABS: 1.0000 | ORAL_TABLET | Freq: Two times a day (BID) | ORAL | 0 refills | Status: AC

## 2024-02-25 NOTE — Progress Notes (Signed)
 Virtual Visit Consent   Ellen Avery, you are scheduled for a virtual visit with a [facility] provider today. Just as with appointments in the office, your consent must be obtained to participate. Your consent will be active for this visit and any virtual visit you may have with one of our providers in the next 365 days. If you have a MyChart account, a copy of this consent can be sent to you electronically.  As this is a virtual visit, video technology does not allow for your provider to perform a traditional examination. This may limit your provider's ability to fully assess your condition. If your provider identifies any concerns that need to be evaluated in person or the need to arrange testing (such as labs, EKG, etc.), we will make arrangements to do so. Although advances in technology are sophisticated, we cannot ensure that it will always work on either your end or our end. If the connection with a video visit is poor, the visit may have to be switched to a telephone visit. With either a video or telephone visit, we are not always able to ensure that we have a secure connection.  By engaging in this virtual visit, you consent to the provision of healthcare and authorize for your insurance to be billed (if applicable) for the services provided during this visit. Depending on your insurance coverage, you may receive a charge related to this service.  I need to obtain your verbal consent now. Are you willing to proceed with your visit today? [name] has provided verbal consent on 02/25/2024 for a virtual visit (video or telephone). Ellen Avery, [place]  Date: 02/25/2024 7:16 PM   Virtual Visit via Video Note   I, Ellen Avery, connected with  Ellen Avery  ([id number], March 02, 1966) on 02/25/24 at  7:15 PM EDT by a video-enabled telemedicine application and verified that I am speaking with the correct person using two identifiers.  Location: Patient: Virtual Visit Location Patient:  Home Provider: Virtual Visit Location Provider: Home Office   I discussed the limitations of evaluation and management by telemedicine and the availability of in person appointments. The patient expressed understanding and agreed to proceed.    History of Present Illness: Ellen Avery is a 58 y.o. who identifies as a female who was assigned female at birth, and is being seen today for sinus headache, pressure, and pain.  HPI: 58 y/o F presents via video telehealth visit for c/o sinus pressure, pain, headache fever with max temp of 100.4 degrees F x 7-8 days. She was seen in UC earlier when her symptoms began and was told of allergies. She was also given a steroid injection. She felt worse the yesterday and earlier today started with fever and feeling generally worse.   Back Pain    Problems:  Patient Active Problem List   Diagnosis Date Noted   Diabetic polyneuropathy associated with type 2 diabetes mellitus (HCC) 07/24/2023   Acute URI 07/24/2023   Annual physical exam 04/22/2023   Type 2 diabetes mellitus with diabetic autonomic neuropathy, with long-term current use of insulin (HCC) 04/22/2023   Seasonal allergic rhinitis 04/22/2023   Tobacco abuse 01/21/2023   Anxiety 01/21/2023   Uncontrolled type 2 diabetes mellitus with hyperglycemia (HCC) 10/22/2022   Tobacco abuse counseling 10/22/2022   Leg pain, bilateral 10/22/2022   Fibromyalgia 10/22/2022   Chest pain in adult 07/20/2019   IDDM (insulin dependent diabetes mellitus) 07/08/2018   HTN (hypertension) 07/08/2018   Dyslipidemia,  goal LDL below 70 07/08/2018   Altered mental status 03/02/2016   MDD (major depressive disorder), recurrent episode, moderate (HCC) 03/02/2016   Psychoses (HCC)     Allergies:  Allergies  Allergen Reactions   Doxycycline Hives and Itching   Flagyl [Metronidazole] Hives and Itching    Benadryl    Medications:  Current Outpatient Medications:    amoxicillin-clavulanate (AUGMENTIN) 875-125  MG tablet, Take 1 tablet by mouth 2 (two) times daily for 7 days., Disp: 14 tablet, Rfl: 0   ACCU-CHEK GUIDE TEST test strip, Check blood sugar 3-4 times daily, Disp: 300 strip, Rfl: 2   Alpha-Lipoic Acid 600 MG TABS, Take 600 mg by mouth daily., Disp: , Rfl:    amitriptyline (ELAVIL) 75 MG tablet, Take 1 tablet (75 mg total) by mouth at bedtime., Disp: 90 tablet, Rfl: 1   aspirin EC 81 MG tablet, Take 81 mg by mouth daily., Disp: , Rfl:    benzonatate (TESSALON) 100 MG capsule, Take 1 capsule (100 mg total) by mouth 3 (three) times daily as needed for cough., Disp: 30 capsule, Rfl: 0   cetirizine (ZYRTEC) 10 MG tablet, TAKE 1 TABLET BY MOUTH EVERY DAY, Disp: 90 tablet, Rfl: 2   Cholecalciferol (VITAMIN D3) 3000 units TABS, Take 3,000 Units by mouth daily as needed (vitamin d)., Disp: , Rfl:    cyanocobalamin (VITAMIN B12) 1000 MCG tablet, Take 1,000 mcg by mouth daily., Disp: , Rfl:    diclofenac (VOLTAREN) 75 MG EC tablet, Take 1 tablet (75 mg total) by mouth 2 (two) times daily., Disp: 14 tablet, Rfl: 0   DULoxetine (CYMBALTA) 60 MG capsule, TAKE 1 CAPSULE BY MOUTH ONCE DAILY, Disp: 90 capsule, Rfl: 2   ezetimibe (ZETIA) 10 MG tablet, Take 1 tablet (10 mg total) by mouth daily., Disp: 90 tablet, Rfl: 3   famotidine (PEPCID) 20 MG tablet, Take 1 tablet by mouth every morning., Disp: 90 tablet, Rfl: 1   fenofibrate (TRICOR) 48 MG tablet, Take 1 tablet by mouth daily., Disp: 90 tablet, Rfl: 1   fluticasone (FLONASE) 50 MCG/ACT nasal spray, Instill 2 sprays in each nostril daily, Disp: 16 mL, Rfl: 5   gabapentin (NEURONTIN) 600 MG tablet, Take 1.5 tablets (900 mg total) by mouth 3 (three) times daily., Disp: 270 tablet, Rfl: 3   GNP ULTICARE PEN NEEDLES 32G X 4 MM MISC, USE WITH insulin ONCE DAILY, Disp: 100 each, Rfl: 2   hydrOXYzine (ATARAX) 25 MG tablet, TAKE 1 TABLET BY MOUTH EVERY 8 HOURS AS NEEDED FOR ANXIETY, Disp: 90 tablet, Rfl: 1   LANTUS SOLOSTAR 100 UNIT/ML Solostar Pen, Inject 75 Units  into the skin at bedtime., Disp: 15 mL, Rfl: 4   LINZESS 145 MCG CAPS capsule, Take 1 capsule (145 mcg total) by mouth every morning., Disp: 90 capsule, Rfl: 1   losartan-hydrochlorothiazide (HYZAAR) 100-12.5 MG tablet, Take 1 tablet by mouth daily., Disp: 90 tablet, Rfl: 1   OZEMPIC, 1 MG/DOSE, 4 MG/3ML SOPN, INJECT 1 MG below THE SKIN AS DIRECTED ONCE WEEKLY, Disp: 3 mL, Rfl: 2   predniSONE (STERAPRED UNI-PAK 21 TAB) 10 MG (21) TBPK tablet, Take as directed, Disp: 21 tablet, Rfl: 0   promethazine-dextromethorphan (PROMETHAZINE-DM) 6.25-15 MG/5ML syrup, Take 5 mLs by mouth 4 (four) times daily as needed for cough., Disp: 118 mL, Rfl: 0   rosuvastatin (CRESTOR) 40 MG tablet, TAKE 1 TABLET BY MOUTH EVERY DAY, Disp: 90 tablet, Rfl: 1   tiZANidine (ZANAFLEX) 4 MG tablet, Take 1 tablet (4 mg  total) by mouth every 8 (eight) hours as needed., Disp: 30 tablet, Rfl: 0  Observations/Objective: Patient is well-developed, well-nourished in no acute distress.  Resting comfortably  at home.  Head is normocephalic, atraumatic.  No labored breathing.  Speech is clear and coherent with logical content.  Patient is alert and oriented at baseline.    Assessment and Plan: 1. Acute bacterial sinusitis (Primary) - amoxicillin-clavulanate (AUGMENTIN) 875-125 MG tablet; Take 1 tablet by mouth 2 (two) times daily for 7 days.  Dispense: 14 tablet; Refill: 0  Increase fluids Continue with Tylenol for fever Start medicine as prescribed. Schedule a virtual appointment if symptoms don't improve.  Pt verbalized understanding and in agreement.    Follow Up Instructions: I discussed the assessment and treatment plan with the patient. The patient was provided an opportunity to ask questions and all were answered. The patient agreed with the plan and demonstrated an understanding of the instructions.  A copy of instructions were sent to the patient via MyChart unless otherwise noted below.   Patient has requested to  receive PHI (AVS, Work Notes, etc) pertaining to this video visit through e-mail as they are currently without active MyChart. They have voiced understand that email is not considered secure and their health information could be viewed by someone other than the patient.   The patient was advised to call back or seek an in-person evaluation if the symptoms worsen or if the condition fails to improve as anticipated.    Ellen Better, PA-C

## 2024-02-25 NOTE — Patient Instructions (Signed)
 Drue Novel, thank you for joining [name], PA-C for today's virtual visit.  While this provider is not your primary care provider (PCP), if your PCP is located in our provider database this encounter information will be shared with them immediately following your visit.   A [facility] MyChart account gives you access to today's visit and all your visits, tests, and labs performed at [facility] " click here if you don't have a [facility] MyChart account or go to [email or web address]  Consent: (Patient) Ellen Avery provided verbal consent for this virtual visit at the beginning of the encounter.  Current Medications:  Current Outpatient Medications:    amoxicillin-clavulanate (AUGMENTIN) 875-125 MG tablet, Take 1 tablet by mouth 2 (two) times daily for 7 days., Disp: 14 tablet, Rfl: 0   ACCU-CHEK GUIDE TEST test strip, Check blood sugar 3-4 times daily, Disp: 300 strip, Rfl: 2   Alpha-Lipoic Acid 600 MG TABS, Take 600 mg by mouth daily., Disp: , Rfl:    amitriptyline (ELAVIL) 75 MG tablet, Take 1 tablet (75 mg total) by mouth at bedtime., Disp: 90 tablet, Rfl: 1   aspirin EC 81 MG tablet, Take 81 mg by mouth daily., Disp: , Rfl:    benzonatate (TESSALON) 100 MG capsule, Take 1 capsule (100 mg total) by mouth 3 (three) times daily as needed for cough., Disp: 30 capsule, Rfl: 0   cetirizine (ZYRTEC) 10 MG tablet, TAKE 1 TABLET BY MOUTH EVERY DAY, Disp: 90 tablet, Rfl: 2   Cholecalciferol (VITAMIN D3) 3000 units TABS, Take 3,000 Units by mouth daily as needed (vitamin d)., Disp: , Rfl:    cyanocobalamin (VITAMIN B12) 1000 MCG tablet, Take 1,000 mcg by mouth daily., Disp: , Rfl:    diclofenac (VOLTAREN) 75 MG EC tablet, Take 1 tablet (75 mg total) by mouth 2 (two) times daily., Disp: 14 tablet, Rfl: 0   DULoxetine (CYMBALTA) 60 MG capsule, TAKE 1 CAPSULE BY MOUTH ONCE DAILY, Disp: 90 capsule, Rfl: 2   ezetimibe (ZETIA) 10 MG tablet, Take 1 tablet (10 mg total) by  mouth daily., Disp: 90 tablet, Rfl: 3   famotidine (PEPCID) 20 MG tablet, Take 1 tablet by mouth every morning., Disp: 90 tablet, Rfl: 1   fenofibrate (TRICOR) 48 MG tablet, Take 1 tablet by mouth daily., Disp: 90 tablet, Rfl: 1   fluticasone (FLONASE) 50 MCG/ACT nasal spray, Instill 2 sprays in each nostril daily, Disp: 16 mL, Rfl: 5   gabapentin (NEURONTIN) 600 MG tablet, Take 1.5 tablets (900 mg total) by mouth 3 (three) times daily., Disp: 270 tablet, Rfl: 3   GNP ULTICARE PEN NEEDLES 32G X 4 MM MISC, USE WITH insulin ONCE DAILY, Disp: 100 each, Rfl: 2   hydrOXYzine (ATARAX) 25 MG tablet, TAKE 1 TABLET BY MOUTH EVERY 8 HOURS AS NEEDED FOR ANXIETY, Disp: 90 tablet, Rfl: 1   LANTUS SOLOSTAR 100 UNIT/ML Solostar Pen, Inject 75 Units into the skin at bedtime., Disp: 15 mL, Rfl: 4   LINZESS 145 MCG CAPS capsule, Take 1 capsule (145 mcg total) by mouth every morning., Disp: 90 capsule, Rfl: 1   losartan-hydrochlorothiazide (HYZAAR) 100-12.5 MG tablet, Take 1 tablet by mouth daily., Disp: 90 tablet, Rfl: 1   OZEMPIC, 1 MG/DOSE, 4 MG/3ML SOPN, INJECT 1 MG below THE SKIN AS DIRECTED ONCE WEEKLY, Disp: 3 mL, Rfl: 2   predniSONE (STERAPRED UNI-PAK 21 TAB) 10 MG (21) TBPK tablet, Take as directed, Disp: 21 tablet, Rfl: 0   promethazine-dextromethorphan (PROMETHAZINE-DM) 6.25-15 MG/5ML  syrup, Take 5 mLs by mouth 4 (four) times daily as needed for cough., Disp: 118 mL, Rfl: 0   rosuvastatin (CRESTOR) 40 MG tablet, TAKE 1 TABLET BY MOUTH EVERY DAY, Disp: 90 tablet, Rfl: 1   tiZANidine (ZANAFLEX) 4 MG tablet, Take 1 tablet (4 mg total) by mouth every 8 (eight) hours as needed., Disp: 30 tablet, Rfl: 0   Medications ordered in this encounter:  Meds ordered this encounter  Medications   amoxicillin-clavulanate (AUGMENTIN) 875-125 MG tablet    Sig: Take 1 tablet by mouth 2 (two) times daily for 7 days.    Dispense:  14 tablet    Refill:  0    Supervising Provider:   Merrilee Jansky [[id number]]     *If you  need refills on other medications prior to your next appointment, please contact your pharmacy*  Follow-Up: Call back or seek an in-person evaluation if the symptoms worsen or if the condition fails to improve as anticipated.  [facility] [phone]  Other Instructions Increase fluids Continue with Tylenol for fever Start medicine as prescribed. Schedule a virtual appointment if symptoms don't improve.    If you have been instructed to have an in-person evaluation today at a local Urgent Care facility, please use the link below. It will take you to a list of all of our available [facility], including address, phone number and hours of operation. Please do not delay care.  [facility]  If you or a family member do not have a primary care provider, use the link below to schedule a visit and establish care. When you choose a [facility] primary care physician or advanced practice provider, you gain a long-term partner in health. Find a Primary Care Provider  Learn more about [facility]'s in-office and virtual care options: [facility] - Get Care Now

## 2024-03-04 ENCOUNTER — Ambulatory Visit: Admitting: Nurse Practitioner

## 2024-03-04 ENCOUNTER — Other Ambulatory Visit: Payer: Self-pay | Admitting: Nurse Practitioner

## 2024-03-04 ENCOUNTER — Other Ambulatory Visit: Payer: Self-pay

## 2024-03-04 ENCOUNTER — Telehealth: Payer: Self-pay

## 2024-03-04 ENCOUNTER — Encounter: Payer: Self-pay | Admitting: Nurse Practitioner

## 2024-03-04 VITALS — BP 126/68 | HR 71 | Temp 98.4°F | Wt 221.6 lb

## 2024-03-04 DIAGNOSIS — R300 Dysuria: Secondary | ICD-10-CM

## 2024-03-04 DIAGNOSIS — E785 Hyperlipidemia, unspecified: Secondary | ICD-10-CM

## 2024-03-04 DIAGNOSIS — I10 Essential (primary) hypertension: Secondary | ICD-10-CM

## 2024-03-04 DIAGNOSIS — M797 Fibromyalgia: Secondary | ICD-10-CM | POA: Diagnosis not present

## 2024-03-04 DIAGNOSIS — Z794 Long term (current) use of insulin: Secondary | ICD-10-CM

## 2024-03-04 DIAGNOSIS — E1143 Type 2 diabetes mellitus with diabetic autonomic (poly)neuropathy: Secondary | ICD-10-CM | POA: Diagnosis not present

## 2024-03-04 LAB — POCT GLYCOSYLATED HEMOGLOBIN (HGB A1C): Hemoglobin A1C: 7.7 % — AB (ref 4.0–5.6)

## 2024-03-04 LAB — POCT URINALYSIS DIP (CLINITEK)
Bilirubin, UA: NEGATIVE
Blood, UA: NEGATIVE
Glucose, UA: 250 mg/dL — AB
Ketones, POC UA: NEGATIVE mg/dL
Leukocytes, UA: NEGATIVE
Nitrite, UA: NEGATIVE
POC PROTEIN,UA: NEGATIVE
Spec Grav, UA: >=1.03 — AB (ref 1.010–1.025)
Urobilinogen, UA: 0.2 U/dL
pH, UA: 5.5 (ref 5.0–8.0)

## 2024-03-04 MED ORDER — INSULIN PEN NEEDLE 32G X 4 MM MISC: 1.0000 | Freq: Every day | 1 refills | Status: DC

## 2024-03-04 MED ORDER — INSULIN GLARGINE 100 UNIT/ML ~~LOC~~ SOLN
75.0000 [IU] | Freq: Every day | SUBCUTANEOUS | 11 refills | Status: DC
Start: 2024-03-04 — End: 2024-07-08

## 2024-03-04 MED ORDER — "INSULIN SYRINGE-NEEDLE U-100 25G X 1"" 1 ML MISC": 1.0000 | Freq: Every day | 3 refills | Status: DC

## 2024-03-04 MED ORDER — FREESTYLE LIBRE 3 SENSOR MISC: 1.0000 | 1 refills | Status: DC

## 2024-03-04 NOTE — Patient Instructions (Addendum)
   Goal for fasting blood sugar ranges from 80 to 120 and 2 hours after any meal or at bedtime should be between 130 to 170.    - POCT glycosylated hemoglobin (Hb A1C) - Microalbumin / creatinine urine ratio - CMP14+EGFR - Lipid panel - Continuous Glucose Sensor (FREESTYLE LIBRE 3 SENSOR) MISC; 1 each by Does not apply route as directed. Place 1 sensor on the skin every 14 days. Use to check glucose continuously  Dispense: 2 each; Refill: 1      It is important that you exercise regularly at least 30 minutes 5 times a week as tolerated  Think about what you will eat, plan ahead. Choose " clean, green, fresh or frozen" over canned, processed or packaged foods which are more sugary, salty and fatty. 70 to 75% of food eaten should be vegetables and fruit. Three meals at set times with snacks allowed between meals, but they must be fruit or vegetables. Aim to eat over a 12 hour period , example 7 am to 7 pm, and STOP after  your last meal of the day. Drink water,generally about 64 ounces per day, no other drink is as healthy. Fruit juice is best enjoyed in a healthy way, by EATING the fruit.  Thanks for choosing [facility] we consider it a privelige to serve you.

## 2024-03-04 NOTE — Assessment & Plan Note (Signed)
 Lab Results  Component Value Date   COLORU yellow 03/04/2024   CLARITYU clear 10/22/2022   GLUCOSEUR =250 (A) 03/04/2024   BILIRUBINUR negative 03/04/2024   SPECGRAV >=1.030 (A) 03/04/2024   RBCUR negative 03/04/2024   PHUR 5.5 03/04/2024   PROTEINUR negative 10/22/2022   UROBILINOGEN 0.2 03/04/2024   LEUKOCYTESUR Negative 03/04/2024  Negative for UTI

## 2024-03-04 NOTE — Assessment & Plan Note (Signed)
 Lab Results  Component Value Date   CHOL 160 08/14/2023   HDL 60 08/14/2023   LDLCALC 79 08/14/2023   TRIG 122 08/14/2023   CHOLHDL 2.7 08/14/2023  On Crestor 40 mg daily, Zetia 10 mg daily, fenofibrate 48 mg daily for hypertriglyceridemia Checking lipid panel LDL goal is less than 70

## 2024-03-04 NOTE — Assessment & Plan Note (Addendum)
 Lab Results  Component Value Date   HGBA1C 7.7 (A) 03/04/2024  Continue Ozempic 1 mg once weekly, Lantus 75 units daily, insulin vial ordered CBG goals discussed, diabetes foot exam completed, patient encouraged to get her diabetes eye exam completed counseled on low-carb diet Freestyle libre ordered Follow-up in 3 months  Neuropathy now taking Norco 04/09/2024 1 tablet every 6 hours as needed Takes gabapentin now 100 mg 3 times daily.   Has a prescription for amitriptyline and duloxetine that she takes inconsistently

## 2024-03-04 NOTE — Assessment & Plan Note (Addendum)
 On gabapentin 900 mg 3 times daily, duloxetine 75 mg daily, Cymbalta 60 mg daily Continue current medication

## 2024-03-04 NOTE — Progress Notes (Signed)
 Established Patient Office Visit  Subjective:  Patient ID: Ellen Avery, female    DOB: 30-Apr-1966  Age: 58 y.o. MRN: [id number]  CC:  Chief Complaint  Patient presents with   Medical Management of Chronic Issues    Patient stated that she need blood work    Urinary Tract Infection    Patient stated that she has the urge to pee but can't, also pain in both sides x2 weeks     HPI Ellen Avery is a 58 y.o. female  has a past medical history of Anxiety, Arthritis, Chronic headaches, DDD (degenerative disc disease), cervical, Fibromyalgia, GERD (gastroesophageal reflux disease), History of colon polyps, History of psychosis, Hypercholesteremia, Hypertension, IBS (irritable bowel syndrome), IBS (irritable bowel syndrome), MDD (major depressive disorder), Right sided facial pain, Seasonal allergic rhinitis, Sinus congestion, Type 2 diabetes mellitus treated with insulin (HCC), Vitamin D deficiency, Wears dentures, and Wears glasses.  Patient presents for follow-up for her chronic medical conditions  Hypertension.  Currently on losartan-hydrochlorothiazide 100-12.5 mg daily.  No chest pain, dizziness, edema  Type 2 diabetes.  Currently on Ozempic 1 mg once weekly, 75 units daily.  States that her diet can be better, states that she has been having problem with her pain needles not working properly, she would like to switch to Using insulin vials , stated that she has had this done in the past.  For hyperlipidemia takes Crestor 40 mg daily, Zetia 10 mg daily.  She denies polyuria, polydipsia  She is now seeing pain management for her neuropathy.  Has a prescription for amitriptyline and duloxetine but does not take the medications more consistently.  Now receiving a prescription for hydrocodone-acetaminophen 5-325 mg tablets from pain management, she has not noticed any improvement in her symptoms   Has upcoming mammogram and diabetic eye exam  Patient complains of dysuria, urinary frequency  for 2 weeks.  No fever, chills, nausea, vomiting , hesitancy    Past Medical History:  Diagnosis Date   Anxiety    Arthritis    Chronic headaches    DDD (degenerative disc disease), cervical    Fibromyalgia    GERD (gastroesophageal reflux disease)    History of colon polyps    History of psychosis    Hypercholesteremia    Hypertension    IBS (irritable bowel syndrome)    IBS (irritable bowel syndrome)    MDD (major depressive disorder)    Right sided facial pain    Seasonal allergic rhinitis    Sinus congestion    12-24-2018 per pt no fever   Type 2 diabetes mellitus treated with insulin (HCC)    Vitamin D deficiency    Wears dentures    full upper,  partial lower   Wears glasses     Past Surgical History:  Procedure Laterality Date   ARTERY BIOPSY Right 12/28/2018   Procedure: RIGHT BIOPSY TEMPORAL ARTERY;  Surgeon: Kinsinger, De Blanch, MD;  Location: [facility] [place];  Service: General;  Laterality: Right;   CARDIAC CATHETERIZATION     MULTIPLE TOOTH EXTRACTIONS  04/ 2019    sedation   TOTAL ABDOMINAL HYSTERECTOMY W/ BILATERAL SALPINGOOPHORECTOMY  2009    Family History  Problem Relation Age of Onset   Diabetes Mother    Heart disease Mother    High blood pressure Mother    Hyperlipidemia Mother    Diabetes Mellitus II Father    Heart Problems Brother    Breast cancer Maternal Aunt  Cervical cancer Paternal Aunt    Diabetes Paternal Uncle    Heart disease Paternal Grandmother    Breast cancer Paternal Grandmother    Head & neck cancer Cousin    Colon cancer Neg Hx    Colon polyps Neg Hx    Esophageal cancer Neg Hx    Rectal cancer Neg Hx    Stomach cancer Neg Hx     Social History   Socioeconomic History   Marital status: Divorced    Spouse name: Not on file   Number of children: 2   Years of education: Not on file   Highest education level: 8th grade  Occupational History   Not on file  Tobacco Use   Smoking status: Every Day     Current packs/day: 1.00    Average packs/day: 1 pack/day for 35.0 years (35.0 ttl pk-yrs)    Types: Cigarettes   Smokeless tobacco: Never  Vaping Use   Vaping status: Never Used  Substance and Sexual Activity   Alcohol use: No   Drug use: No   Sexual activity: Not Currently    Birth control/protection: Surgical  Other Topics Concern   Not on file  Social History Narrative   Lives home alone    Social Drivers of Health   Financial Resource Strain: Medium Risk (03/03/2024)   Overall Financial Resource Strain (CARDIA)    Difficulty of Paying Living Expenses: Somewhat hard  Food Insecurity: Food Insecurity Present (03/03/2024)   Hunger Vital Sign    Worried About Running Out of Food in the Last Year: Sometimes true    Ran Out of Food in the Last Year: Sometimes true  Transportation Needs: No Transportation Needs (03/03/2024)   PRAPARE - Administrator, Civil Service (Medical): No    Lack of Transportation (Non-Medical): No  Physical Activity: Unknown (03/03/2024)   Exercise Vital Sign    Days of Exercise per Week: Patient declined    Minutes of Exercise per Session: 30 min  Stress: Stress Concern Present (03/03/2024)   [employer] of Occupational Health - Occupational Stress Questionnaire    Feeling of Stress : To some extent  Social Connections: Moderately Integrated (03/03/2024)   Social Connection and Isolation Panel [NHANES]    Frequency of Communication with Friends and Family: More than three times a week    Frequency of Social Gatherings with Friends and Family: Never    Attends Religious Services: More than 4 times per year    Active Member of [employer] or Organizations: Yes    Attends Engineer, structural: More than 4 times per year    Marital Status: Divorced  Catering manager Violence: Not on file    Outpatient Medications Prior to Visit  Medication Sig Dispense Refill   ACCU-CHEK GUIDE TEST test strip Check blood sugar 3-4 times daily 300 strip  2   Alpha-Lipoic Acid 600 MG TABS Take 600 mg by mouth daily.     amitriptyline (ELAVIL) 75 MG tablet Take 1 tablet (75 mg total) by mouth at bedtime. 90 tablet 1   aspirin EC 81 MG tablet Take 81 mg by mouth daily.     cetirizine (ZYRTEC) 10 MG tablet TAKE 1 TABLET BY MOUTH EVERY DAY 90 tablet 2   Cholecalciferol (VITAMIN D3) 3000 units TABS Take 3,000 Units by mouth daily as needed (vitamin d).     cyanocobalamin (VITAMIN B12) 1000 MCG tablet Take 1,000 mcg by mouth daily.     DULoxetine (CYMBALTA) 60 MG capsule  TAKE 1 CAPSULE BY MOUTH ONCE DAILY 90 capsule 2   ezetimibe (ZETIA) 10 MG tablet Take 1 tablet (10 mg total) by mouth daily. 90 tablet 3   famotidine (PEPCID) 20 MG tablet Take 1 tablet by mouth every morning. 90 tablet 1   fenofibrate (TRICOR) 48 MG tablet Take 1 tablet by mouth daily. 90 tablet 1   fluticasone (FLONASE) 50 MCG/ACT nasal spray Instill 2 sprays in each nostril daily 16 mL 5   gabapentin (NEURONTIN) 600 MG tablet Take 1.5 tablets (900 mg total) by mouth 3 (three) times daily. 270 tablet 3   HYDROcodone-acetaminophen (NORCO/VICODIN) 5-325 MG tablet Take 1 tablet by mouth every 6 (six) hours as needed for moderate pain (pain score 4-6).     hydrOXYzine (ATARAX) 25 MG tablet TAKE 1 TABLET BY MOUTH EVERY 8 HOURS AS NEEDED FOR ANXIETY 90 tablet 1   LINZESS 145 MCG CAPS capsule Take 1 capsule (145 mcg total) by mouth every morning. 90 capsule 1   losartan-hydrochlorothiazide (HYZAAR) 100-12.5 MG tablet Take 1 tablet by mouth daily. 90 tablet 1   OZEMPIC, 1 MG/DOSE, 4 MG/3ML SOPN INJECT 1 MG below THE SKIN AS DIRECTED ONCE WEEKLY 3 mL 2   rosuvastatin (CRESTOR) 40 MG tablet TAKE 1 TABLET BY MOUTH EVERY DAY 90 tablet 1   tiZANidine (ZANAFLEX) 4 MG tablet Take 1 tablet (4 mg total) by mouth every 8 (eight) hours as needed. 30 tablet 0   GNP ULTICARE PEN NEEDLES 32G X 4 MM MISC USE WITH insulin ONCE DAILY 100 each 2   LANTUS SOLOSTAR 100 UNIT/ML Solostar Pen Inject 75 Units into  the skin at bedtime. 15 mL 4   benzonatate (TESSALON) 100 MG capsule Take 1 capsule (100 mg total) by mouth 3 (three) times daily as needed for cough. 30 capsule 0   diclofenac (VOLTAREN) 75 MG EC tablet Take 1 tablet (75 mg total) by mouth 2 (two) times daily. 14 tablet 0   predniSONE (STERAPRED UNI-PAK 21 TAB) 10 MG (21) TBPK tablet Take as directed 21 tablet 0   promethazine-dextromethorphan (PROMETHAZINE-DM) 6.25-15 MG/5ML syrup Take 5 mLs by mouth 4 (four) times daily as needed for cough. 118 mL 0   No facility-administered medications prior to visit.    Allergies  Allergen Reactions   Doxycycline Hives and Itching   Flagyl [Metronidazole] Hives and Itching    Benadryl     ROS Review of Systems  Constitutional:  Negative for appetite change, chills, fatigue and fever.  HENT:  Negative for congestion, postnasal drip, rhinorrhea and sneezing.   Respiratory:  Negative for cough, shortness of breath and wheezing.   Cardiovascular:  Negative for chest pain, palpitations and leg swelling.  Gastrointestinal:  Negative for abdominal pain, constipation, nausea and vomiting.  Genitourinary:  Negative for difficulty urinating, dysuria, flank pain and frequency.  Musculoskeletal:  Negative for arthralgias, back pain, joint swelling and myalgias.  Skin:  Negative for color change, pallor, rash and wound.  Neurological:  Negative for dizziness, facial asymmetry, weakness, numbness and headaches.  Psychiatric/Behavioral:  Negative for behavioral problems, confusion, self-injury and suicidal ideas.       Objective:    Physical Exam Vitals and nursing note reviewed.  Constitutional:      General: She is not in acute distress.    Appearance: Normal appearance. She is not ill-appearing, toxic-appearing or diaphoretic.  HENT:     Mouth/Throat:     Mouth: Mucous membranes are moist.     Pharynx: Oropharynx is  clear. No oropharyngeal exudate or posterior oropharyngeal erythema.  Eyes:      General: No scleral icterus.       Right eye: No discharge.        Left eye: No discharge.     Extraocular Movements: Extraocular movements intact.     Conjunctiva/sclera: Conjunctivae normal.  Cardiovascular:     Rate and Rhythm: Normal rate and regular rhythm.     Pulses: Normal pulses.     Heart sounds: Normal heart sounds. No murmur heard.    No friction rub. No gallop.  Pulmonary:     Effort: Pulmonary effort is normal. No respiratory distress.     Breath sounds: Normal breath sounds. No stridor. No wheezing, rhonchi or rales.  Chest:     Chest wall: No tenderness.  Abdominal:     General: There is no distension.     Palpations: Abdomen is soft.     Tenderness: There is no abdominal tenderness. There is no right CVA tenderness, left CVA tenderness or guarding.  Musculoskeletal:        General: No swelling, tenderness, deformity or signs of injury.     Right lower leg: No edema.     Left lower leg: No edema.  Skin:    General: Skin is warm and dry.     Capillary Refill: Capillary refill takes less than 2 seconds.     Coloration: Skin is not jaundiced or pale.     Findings: No bruising, erythema or lesion.  Neurological:     Mental Status: She is alert and oriented to person, place, and time.     Motor: No weakness.     Coordination: Coordination normal.     Gait: Gait normal.  Psychiatric:        Mood and Affect: Mood normal.        Behavior: Behavior normal.        Thought Content: Thought content normal.        Judgment: Judgment normal.     BP 126/68   Pulse 71   Temp 98.4 F (36.9 C) (Oral)   Wt 221 lb 9.6 oz (100.5 kg)   SpO2 98%   BMI 34.71 kg/m  Wt Readings from Last 3 Encounters:  03/04/24 221 lb 9.6 oz (100.5 kg)  10/19/23 213 lb (96.6 kg)  08/19/23 226 lb 8 oz (102.7 kg)    Lab Results  Component Value Date   TSH 0.57 03/19/2022   Lab Results  Component Value Date   WBC 7.7 12/24/2022   HGB 13.3 12/24/2022   HCT 41.9 12/24/2022   MCV 87.1  12/24/2022   PLT 371 12/24/2022   Lab Results  Component Value Date   NA 144 08/14/2023   K 4.1 08/14/2023   CO2 25 08/14/2023   GLUCOSE 107 (H) 08/14/2023   BUN 10 08/14/2023   CREATININE 0.89 08/14/2023   BILITOT <0.2 08/14/2023   ALKPHOS 111 08/14/2023   AST 11 08/14/2023   ALT 11 08/14/2023   PROT 7.1 08/14/2023   ALBUMIN 4.4 08/14/2023   CALCIUM 10.0 08/14/2023   ANIONGAP 9 12/24/2022   EGFR 76 08/14/2023   Lab Results  Component Value Date   CHOL 160 08/14/2023   Lab Results  Component Value Date   HDL 60 08/14/2023   Lab Results  Component Value Date   LDLCALC 79 08/14/2023   Lab Results  Component Value Date   TRIG 122 08/14/2023   Lab Results  Component Value Date  CHOLHDL 2.7 08/14/2023   Lab Results  Component Value Date   HGBA1C 7.7 (A) 03/04/2024      Assessment & Plan:   Problem List Items Addressed This Visit       Cardiovascular and Mediastinum   HTN (hypertension)   Well-controlledContinue losartan-hydrochlorothiazide 100-12.5 mg 1 tablet dail  DASH diet and commitment to daily physical activity for a minimum of 30 minutes discussed and encouraged, as a part of hypertension management.      03/04/2024   12:02 PM 03/04/2024   11:01 AM 10/19/2023    7:50 AM 10/19/2023    7:47 AM 10/19/2023    3:08 AM 10/19/2023   12:00 AM 10/18/2023   11:40 PM  BP/Weight  Systolic BP 126 112 100 97 110  128  Diastolic BP 68 84 59 56 74  77  Wt. (Lbs)  221.6    213   BMI  34.71 kg/m2    33.36 kg/m2              Endocrine   Type 2 diabetes mellitus with diabetic autonomic neuropathy, with long-term current use of insulin (HCC) - Primary   Lab Results  Component Value Date   HGBA1C 7.7 (A) 03/04/2024  Continue Ozempic 1 mg once weekly, Lantus 75 units daily, insulin vial ordered CBG goals discussed, diabetes foot exam completed, patient encouraged to get her diabetes eye exam completed counseled on low-carb diet Freestyle libre  ordered Follow-up in 3 months  Neuropathy now taking Norco 04/09/2024 1 tablet every 6 hours as needed Takes gabapentin now 100 mg 3 times daily.   Has a prescription for amitriptyline and duloxetine that she takes inconsistently       Relevant Medications   Continuous Glucose Sensor (FREESTYLE LIBRE 3 SENSOR) MISC   insulin glargine (LANTUS) 100 UNIT/ML injection   Insulin Syringe-Needle U-100 25G X 1" 1 ML MISC   Other Relevant Orders   POCT glycosylated hemoglobin (Hb A1C) (Completed)   Microalbumin / creatinine urine ratio   CMP14+EGFR   Lipid panel     Other   Dyslipidemia, goal LDL below 70   Lab Results  Component Value Date   CHOL 160 08/14/2023   HDL 60 08/14/2023   LDLCALC 79 08/14/2023   TRIG 122 08/14/2023   CHOLHDL 2.7 08/14/2023  On Crestor 40 mg daily, Zetia 10 mg daily, fenofibrate 48 mg daily for hypertriglyceridemia Checking lipid panel LDL goal is less than 70      Fibromyalgia   On gabapentin 900 mg 3 times daily, duloxetine 75 mg daily, Cymbalta 60 mg daily Continue current medication      Relevant Medications   HYDROcodone-acetaminophen (NORCO/VICODIN) 5-325 MG tablet   Dysuria   Lab Results  Component Value Date   COLORU yellow 03/04/2024   CLARITYU clear 10/22/2022   GLUCOSEUR =250 (A) 03/04/2024   BILIRUBINUR negative 03/04/2024   SPECGRAV >=1.030 (A) 03/04/2024   RBCUR negative 03/04/2024   PHUR 5.5 03/04/2024   PROTEINUR negative 10/22/2022   UROBILINOGEN 0.2 03/04/2024   LEUKOCYTESUR Negative 03/04/2024  Negative for UTI      Relevant Orders   POCT URINALYSIS DIP (CLINITEK) (Completed)    Meds ordered this encounter  Medications   Continuous Glucose Sensor (FREESTYLE LIBRE 3 SENSOR) MISC    Sig: 1 each by Does not apply route as directed. Place 1 sensor on the skin every 14 days. Use to check glucose continuously    Dispense:  2 each    Refill:  1   insulin glargine (LANTUS) 100 UNIT/ML injection    Sig: Inject 0.75 mLs (75  Units total) into the skin daily.    Dispense:  10 mL    Refill:  11   DISCONTD: Insulin Pen Needle 32G X 4 MM MISC    Sig: Inject 1 each into the skin daily.    Dispense:  100 each    Refill:  1   Insulin Syringe-Needle U-100 25G X 1" 1 ML MISC    Sig: Inject 1 each into the skin daily.    Dispense:  100 each    Refill:  3    Follow-up: Return in about 3 months (around 06/04/2024) for DM, HTN.    Donell Beers, FNP

## 2024-03-04 NOTE — Progress Notes (Signed)
   03/04/2024  Patient ID: Ellen Avery, female   DOB: 03-Jun-1966, 58 y.o.   MRN: [id number]  Care Coordination Call  The PCP sent me a secure chat regarding the appropriate syringe needles for the Lantus vial. I clarified the order over the phone and discussed with the provider the need to discontinue the "insulin pen needle" prescription in the chart. However, since the prescription had already been sent, I contacted the pharmacy and requested that they discontinue the "insulin pen needle" and retain the prescription for the "insulin syringe needle U-100" for the vials. Danielle, the pharmacist at [facility], confirmed that they will adjust the order to match the supplies on hand for the 1 mL syringe.  Thank you for allowing pharmacy to be a part of this patient's care. Cephus Shelling, PharmD Clinical Pharmacist Cell: [phone]

## 2024-03-04 NOTE — Telephone Encounter (Signed)
 Pharmacy Patient Advocate Encounter  Received notification from [facility] MEDICAID that Prior Authorization for FREESTYLE LIBRE 3 SENSOR has been APPROVED from 03/04/2024 to 09/04/2024   PA #/Case ID/Reference #: [id number]

## 2024-03-04 NOTE — Assessment & Plan Note (Signed)
 Well-controlledContinue losartan-hydrochlorothiazide 100-12.5 mg 1 tablet dail  DASH diet and commitment to daily physical activity for a minimum of 30 minutes discussed and encouraged, as a part of hypertension management.      03/04/2024   12:02 PM 03/04/2024   11:01 AM 10/19/2023    7:50 AM 10/19/2023    7:47 AM 10/19/2023    3:08 AM 10/19/2023   12:00 AM 10/18/2023   11:40 PM  BP/Weight  Systolic BP 126 112 100 97 110  128  Diastolic BP 68 84 59 56 74  77  Wt. (Lbs)  221.6    213   BMI  34.71 kg/m2    33.36 kg/m2

## 2024-03-05 LAB — CMP14+EGFR
ALT: 16 IU/L (ref 0–32)
AST: 12 IU/L (ref 0–40)
Albumin: 4.2 g/dL (ref 3.8–4.9)
Alkaline Phosphatase: 99 IU/L (ref 44–121)
BUN/Creatinine Ratio: 11 (ref 9–23)
BUN: 9 mg/dL (ref 6–24)
Bilirubin Total: <0.2 mg/dL (ref 0.0–1.2)
CO2: 24 mmol/L (ref 20–29)
Calcium: 9.2 mg/dL (ref 8.7–10.2)
Chloride: 104 mmol/L (ref 96–106)
Creatinine, Ser: 0.79 mg/dL (ref 0.57–1.00)
Globulin, Total: 2.6 g/dL (ref 1.5–4.5)
Glucose: 93 mg/dL (ref 70–99)
Potassium: 4.1 mmol/L (ref 3.5–5.2)
Sodium: 143 mmol/L (ref 134–144)
Total Protein: 6.8 g/dL (ref 6.0–8.5)
eGFR: 87 mL/min/{1.73_m2} (ref >59)

## 2024-03-05 LAB — MICROALBUMIN / CREATININE URINE RATIO
Creatinine, Urine: 213.5 mg/dL
Microalb/Creat Ratio: 6 mg/g{creat} (ref 0–29)
Microalbumin, Urine: 12.5 ug/mL

## 2024-03-05 LAB — LIPID PANEL
Chol/HDL Ratio: 2.1 ratio (ref 0.0–4.4)
Cholesterol, Total: 123 mg/dL (ref 100–199)
HDL: 59 mg/dL (ref >39)
LDL Chol Calc (NIH): 45 mg/dL (ref 0–99)
Triglycerides: 106 mg/dL (ref 0–149)
VLDL Cholesterol Cal: 19 mg/dL (ref 5–40)

## 2024-03-08 ENCOUNTER — Other Ambulatory Visit: Payer: Self-pay

## 2024-03-08 ENCOUNTER — Other Ambulatory Visit: Payer: Self-pay | Admitting: Nurse Practitioner

## 2024-03-08 DIAGNOSIS — Z794 Long term (current) use of insulin: Secondary | ICD-10-CM

## 2024-03-08 DIAGNOSIS — E1165 Type 2 diabetes mellitus with hyperglycemia: Secondary | ICD-10-CM

## 2024-03-08 DIAGNOSIS — F419 Anxiety disorder, unspecified: Secondary | ICD-10-CM

## 2024-03-08 DIAGNOSIS — J302 Other seasonal allergic rhinitis: Secondary | ICD-10-CM

## 2024-03-08 DIAGNOSIS — E785 Hyperlipidemia, unspecified: Secondary | ICD-10-CM

## 2024-03-08 MED ORDER — AMITRIPTYLINE HCL 75 MG PO TABS: 75.0000 mg | ORAL_TABLET | Freq: Every day | ORAL | 1 refills | Status: DC

## 2024-03-08 MED ORDER — EZETIMIBE 10 MG PO TABS: 10.0000 mg | ORAL_TABLET | Freq: Every day | ORAL | 3 refills | Status: DC

## 2024-03-08 MED ORDER — OZEMPIC (1 MG/DOSE) 4 MG/3ML ~~LOC~~ SOPN
1.0000 mg | PEN_INJECTOR | SUBCUTANEOUS | 2 refills | Status: DC
Start: 2024-03-08 — End: 2024-05-12

## 2024-03-08 MED ORDER — DULOXETINE HCL 60 MG PO CPEP: 60.0000 mg | ORAL_CAPSULE | Freq: Every day | ORAL | 2 refills | Status: DC

## 2024-03-08 MED ORDER — FLUTICASONE PROPIONATE 50 MCG/ACT NA SUSP: 2.0000 | Freq: Every day | NASAL | 5 refills | Status: DC

## 2024-03-08 MED ORDER — TIZANIDINE HCL 4 MG PO TABS: 4.0000 mg | ORAL_TABLET | Freq: Three times a day (TID) | ORAL | 0 refills | Status: DC | PRN

## 2024-03-08 MED ORDER — GABAPENTIN 600 MG PO TABS: 900.0000 mg | ORAL_TABLET | Freq: Three times a day (TID) | ORAL | 3 refills | Status: DC

## 2024-03-08 MED ORDER — ROSUVASTATIN CALCIUM 40 MG PO TABS: 40.0000 mg | ORAL_TABLET | Freq: Every day | ORAL | 1 refills | Status: DC

## 2024-03-08 MED ORDER — ACCU-CHEK GUIDE TEST VI STRP: ORAL_STRIP | 2 refills | Status: AC

## 2024-03-08 MED ORDER — CETIRIZINE HCL 10 MG PO TABS: 10.0000 mg | ORAL_TABLET | Freq: Every day | ORAL | 2 refills | Status: DC

## 2024-03-08 MED ORDER — LOSARTAN POTASSIUM-HCTZ 100-12.5 MG PO TABS: 1.0000 | ORAL_TABLET | Freq: Every day | ORAL | 1 refills | Status: DC

## 2024-03-08 MED ORDER — HYDROXYZINE HCL 25 MG PO TABS: 25.0000 mg | ORAL_TABLET | Freq: Three times a day (TID) | ORAL | 1 refills | Status: DC | PRN

## 2024-03-08 MED ORDER — LINZESS 145 MCG PO CAPS
145.0000 ug | ORAL_CAPSULE | Freq: Every morning | ORAL | 1 refills | Status: DC
Start: 2024-03-08 — End: 2024-06-06

## 2024-03-08 MED ORDER — LANTUS SOLOSTAR 100 UNIT/ML ~~LOC~~ SOPN: 65.0000 [IU] | PEN_INJECTOR | Freq: Every day | SUBCUTANEOUS | Status: DC

## 2024-03-08 MED ORDER — FAMOTIDINE 20 MG PO TABS: 20.0000 mg | ORAL_TABLET | Freq: Every morning | ORAL | 1 refills | Status: DC

## 2024-03-08 MED ORDER — FENOFIBRATE 48 MG PO TABS: 48.0000 mg | ORAL_TABLET | Freq: Every day | ORAL | 1 refills | Status: DC

## 2024-03-08 NOTE — Telephone Encounter (Signed)
 Please advise [facility]

## 2024-03-09 ENCOUNTER — Ambulatory Visit
Admission: RE | Admit: 2024-03-09 | Discharge: 2024-03-09 | Disposition: A | Discharge status: Home / Self Care | Source: Ambulatory Visit | Attending: Nurse Practitioner | Admitting: Nurse Practitioner

## 2024-03-09 DIAGNOSIS — Z0000 Encounter for general adult medical examination without abnormal findings: Secondary | ICD-10-CM

## 2024-03-18 DIAGNOSIS — G894 Chronic pain syndrome: Secondary | ICD-10-CM | POA: Diagnosis not present

## 2024-03-18 DIAGNOSIS — M79604 Pain in right leg: Secondary | ICD-10-CM | POA: Diagnosis not present

## 2024-03-18 DIAGNOSIS — I10 Essential (primary) hypertension: Secondary | ICD-10-CM | POA: Diagnosis not present

## 2024-03-18 DIAGNOSIS — Z6834 Body mass index (BMI) 34.0-34.9, adult: Secondary | ICD-10-CM | POA: Diagnosis not present

## 2024-03-18 DIAGNOSIS — E6609 Other obesity due to excess calories: Secondary | ICD-10-CM | POA: Diagnosis not present

## 2024-03-18 DIAGNOSIS — M79605 Pain in left leg: Secondary | ICD-10-CM | POA: Diagnosis not present

## 2024-03-18 DIAGNOSIS — K219 Gastro-esophageal reflux disease without esophagitis: Secondary | ICD-10-CM | POA: Diagnosis not present

## 2024-03-18 DIAGNOSIS — E1142 Type 2 diabetes mellitus with diabetic polyneuropathy: Secondary | ICD-10-CM | POA: Diagnosis not present

## 2024-03-18 DIAGNOSIS — Z794 Long term (current) use of insulin: Secondary | ICD-10-CM | POA: Diagnosis not present

## 2024-03-18 DIAGNOSIS — Z79899 Other long term (current) drug therapy: Secondary | ICD-10-CM | POA: Diagnosis not present

## 2024-03-18 DIAGNOSIS — E7800 Pure hypercholesterolemia, unspecified: Secondary | ICD-10-CM | POA: Diagnosis not present

## 2024-03-25 ENCOUNTER — Other Ambulatory Visit: Payer: Self-pay

## 2024-03-25 ENCOUNTER — Other Ambulatory Visit: Payer: Self-pay | Admitting: Nurse Practitioner

## 2024-03-25 DIAGNOSIS — E1143 Type 2 diabetes mellitus with diabetic autonomic (poly)neuropathy: Secondary | ICD-10-CM

## 2024-04-18 DIAGNOSIS — G894 Chronic pain syndrome: Secondary | ICD-10-CM | POA: Diagnosis not present

## 2024-04-18 DIAGNOSIS — I10 Essential (primary) hypertension: Secondary | ICD-10-CM | POA: Diagnosis not present

## 2024-04-18 DIAGNOSIS — M79604 Pain in right leg: Secondary | ICD-10-CM | POA: Diagnosis not present

## 2024-04-18 DIAGNOSIS — Z6835 Body mass index (BMI) 35.0-35.9, adult: Secondary | ICD-10-CM | POA: Diagnosis not present

## 2024-04-18 DIAGNOSIS — M79605 Pain in left leg: Secondary | ICD-10-CM | POA: Diagnosis not present

## 2024-04-18 DIAGNOSIS — E6609 Other obesity due to excess calories: Secondary | ICD-10-CM | POA: Diagnosis not present

## 2024-04-18 DIAGNOSIS — Z79899 Other long term (current) drug therapy: Secondary | ICD-10-CM | POA: Diagnosis not present

## 2024-04-20 DIAGNOSIS — Z79899 Other long term (current) drug therapy: Secondary | ICD-10-CM | POA: Diagnosis not present

## 2024-04-21 ENCOUNTER — Emergency Department (HOSPITAL_COMMUNITY)
Admission: EM | Admit: 2024-04-21 | Discharge: 2024-04-21 | Disposition: A | Discharge status: Home / Self Care | Attending: Emergency Medicine | Admitting: Emergency Medicine

## 2024-04-21 ENCOUNTER — Emergency Department (HOSPITAL_COMMUNITY)

## 2024-04-21 ENCOUNTER — Encounter (HOSPITAL_COMMUNITY): Payer: Self-pay | Admitting: *Deleted

## 2024-04-21 ENCOUNTER — Other Ambulatory Visit: Payer: Self-pay

## 2024-04-21 DIAGNOSIS — W010XXA Fall on same level from slipping, tripping and stumbling without subsequent striking against object, initial encounter: Secondary | ICD-10-CM | POA: Diagnosis not present

## 2024-04-21 DIAGNOSIS — Z7982 Long term (current) use of aspirin: Secondary | ICD-10-CM | POA: Diagnosis not present

## 2024-04-21 DIAGNOSIS — Z720 Tobacco use: Secondary | ICD-10-CM | POA: Insufficient documentation

## 2024-04-21 DIAGNOSIS — Z79899 Other long term (current) drug therapy: Secondary | ICD-10-CM | POA: Diagnosis not present

## 2024-04-21 DIAGNOSIS — M25551 Pain in right hip: Secondary | ICD-10-CM | POA: Insufficient documentation

## 2024-04-21 DIAGNOSIS — E119 Type 2 diabetes mellitus without complications: Secondary | ICD-10-CM | POA: Insufficient documentation

## 2024-04-21 DIAGNOSIS — M546 Pain in thoracic spine: Secondary | ICD-10-CM | POA: Diagnosis not present

## 2024-04-21 DIAGNOSIS — Y92039 Unspecified place in apartment as the place of occurrence of the external cause: Secondary | ICD-10-CM | POA: Diagnosis not present

## 2024-04-21 DIAGNOSIS — Z794 Long term (current) use of insulin: Secondary | ICD-10-CM | POA: Insufficient documentation

## 2024-04-21 DIAGNOSIS — Z043 Encounter for examination and observation following other accident: Secondary | ICD-10-CM | POA: Diagnosis not present

## 2024-04-21 DIAGNOSIS — I10 Essential (primary) hypertension: Secondary | ICD-10-CM | POA: Diagnosis not present

## 2024-04-21 DIAGNOSIS — M2578 Osteophyte, vertebrae: Secondary | ICD-10-CM | POA: Diagnosis not present

## 2024-04-21 DIAGNOSIS — W19XXXA Unspecified fall, initial encounter: Secondary | ICD-10-CM

## 2024-04-21 MED ORDER — HYDROCODONE-ACETAMINOPHEN 5-325 MG PO TABS
1.0000 | ORAL_TABLET | Freq: Once | ORAL | Status: AC
  Administered 2024-04-21: 1 via ORAL
  Filled 2024-04-21: qty 1

## 2024-04-21 NOTE — ED Provider Triage Note (Signed)
 Emergency Medicine Provider Triage Evaluation Note  Ellen Avery , a 58 y.o. female  was evaluated in triage.  Pt complains of fall Pt with slip and fall down her steps this am No head injury or LOC, no thinners. Pain to right hip after the fall, was ambulatory.   Review of Systems  Positive: Hip pain Negative: Headache loc  Physical Exam  BP 131/85 (BP Location: Right Arm)   Pulse (!) 42   Temp 98.3 F (36.8 C)   Resp 20   SpO2 (!) 88%  Gen:   Awake, no distress   Resp:  Normal effort  MSK:   Moves extremities without difficulty  Other:  Ttp right hip  Medical Decision Making  Medically screening exam initiated at 9:52 AM.  Appropriate orders placed.  Ellen Avery was informed that the remainder of the evaluation will be completed by another provider, this initial triage assessment does not replace that evaluation, and the importance of remaining in the ED until their evaluation is complete.  Counseled patient for approximately 3 minutes regarding smoking cessation. Discussed risks of smoking and how they applied and affected their visit here today.   CPT code: 96045: intermediate counseling for smoking cessation     Teddi Favors, DO 04/21/24 [phone]

## 2024-04-21 NOTE — ED Triage Notes (Signed)
 Pt states that she tripped and fell down her steps pta.  Pt is reporting right hip and shoulder pain.  Pt denies hitting her head and she is not on any blood thinners.  Pt was ambulatory after fall.

## 2024-04-21 NOTE — Discharge Instructions (Signed)
 It was a pleasure taking care of you today.  Today we evaluated your hip and back after your fall at home.  Based on your imaging today no acute fractures were seen.  Please follow-up with your primary care provider as scheduled or sooner if symptoms persist.  Turn to the emergency department or seek further medical care if you experience any of the following symptoms including but not limited to weakness, headache, nausea, vomiting, severe pain, inability to bear weight.

## 2024-04-21 NOTE — ED Provider Notes (Signed)
 [place] EMERGENCY DEPARTMENT AT [facility] Provider Note   CSN: [id number] Arrival date & time: 04/21/24  4098     History  Chief Complaint  Patient presents with   Ellen Avery    Ellen Avery is a 58 y.o. female who presents to the emergency department after a mechanical fall at home.  Patient was going to walk down the stairs outside of her apartment, and states that the stairs were wet and that her feet slid out from under her and she fell backwards onto the stairs.  She denies hitting her head, denies loss of consciousness, denies blood thinners.  Patient states that she is in pain at her right hip and back.  Patient ambulatory after the fall, states that she went back up to her apartment and soaked in Epsom salt bath, but the the pain continued so she decided to come to the emergency department.  Patient denies history of hip replacement.  Past medical history significant for hypertension, dyslipidemia, type 2 diabetes, tobacco use.    Fall Pertinent negatives include no chest pain, no abdominal pain, no headaches and no shortness of breath.       Home Medications Prior to Admission medications   Medication Sig Start Date End Date Taking? Authorizing Provider  ACCU-CHEK GUIDE TEST test strip Use as instructed 03/08/24   Paseda, Folashade R, FNP  Alpha-Lipoic Acid 600 MG TABS Take 600 mg by mouth daily.    [provider]  amitriptyline  (ELAVIL ) 75 MG tablet Take 1 tablet (75 mg total) by mouth at bedtime. 03/08/24   Paseda, Folashade R, FNP  aspirin  EC 81 MG tablet Take 81 mg by mouth daily.    [provider]  cetirizine  (ZYRTEC ) 10 MG tablet Take 1 tablet (10 mg total) by mouth daily. 03/08/24   Paseda, Folashade R, FNP  Cholecalciferol (VITAMIN D3) 3000 units TABS Take 3,000 Units by mouth daily as needed (vitamin d ).    [provider]  Continuous Glucose Sensor (FREESTYLE LIBRE 3 SENSOR) MISC APPLY 1 SENSOR ON TO THE SKIN EVERY 14 DAYS. USE TO  CHECK GLUCOSE CONTINUOUSLY 03/28/24   Jerrlyn Morel, NP  cyanocobalamin  (VITAMIN B12) 1000 MCG tablet Take 1,000 mcg by mouth daily.    [provider]  DULoxetine  (CYMBALTA ) 60 MG capsule Take 1 capsule (60 mg total) by mouth daily. 03/08/24   Paseda, Folashade R, FNP  ezetimibe  (ZETIA ) 10 MG tablet Take 1 tablet (10 mg total) by mouth daily. 03/08/24 03/08/25  Paseda, Folashade R, FNP  famotidine  (PEPCID ) 20 MG tablet Take 1 tablet (20 mg total) by mouth every morning. 03/08/24   Paseda, Folashade R, FNP  fenofibrate  (TRICOR ) 48 MG tablet Take 1 tablet (48 mg total) by mouth daily. 03/08/24   Paseda, Folashade R, FNP  fluticasone  (FLONASE ) 50 MCG/ACT nasal spray Place 2 sprays into both nostrils daily. 03/08/24   Paseda, Folashade R, FNP  gabapentin  (NEURONTIN ) 600 MG tablet Take 1.5 tablets (900 mg total) by mouth 3 (three) times daily. 03/08/24 03/03/25  Paseda, Folashade R, FNP  HYDROcodone -acetaminophen  (NORCO/VICODIN) 5-325 MG tablet Take 1 tablet by mouth every 6 (six) hours as needed for moderate pain (pain score 4-6).    [provider]  hydrOXYzine  (ATARAX ) 25 MG tablet Take 1 tablet (25 mg total) by mouth every 8 (eight) hours as needed. for anxiety 03/08/24   Paseda, Folashade R, FNP  insulin  glargine (LANTUS ) 100 UNIT/ML injection Inject 0.75 mLs (75 Units total) into the skin  daily. 03/04/24   Paseda, Folashade R, FNP  Insulin  Syringe-Needle U-100 25G X 1" 1 ML MISC Inject 1 each into the skin daily. 03/04/24   Paseda, Folashade R, FNP  LANTUS  SOLOSTAR 100 UNIT/ML Solostar Pen Inject 65 Units into the skin at bedtime. 03/08/24   Paseda, Folashade R, FNP  LINZESS  145 MCG CAPS capsule Take 1 capsule (145 mcg total) by mouth every morning. 03/08/24   Paseda, Folashade R, FNP  losartan -hydrochlorothiazide (HYZAAR) 100-12.5 MG tablet Take 1 tablet by mouth daily. 03/08/24   Paseda, Folashade R, FNP  rosuvastatin  (CRESTOR ) 40 MG tablet Take 1 tablet (40 mg total) by mouth daily. 03/08/24   Paseda,  Folashade R, FNP  Semaglutide , 1 MG/DOSE, (OZEMPIC , 1 MG/DOSE,) 4 MG/3ML SOPN Inject 1 mg as directed every 7 (seven) days. 03/08/24   Paseda, Folashade R, FNP  tiZANidine  (ZANAFLEX ) 4 MG tablet Take 1 tablet (4 mg total) by mouth every 8 (eight) hours as needed. 03/08/24   Paseda, Folashade R, FNP      Allergies    Doxycycline  and Flagyl [metronidazole]    Review of Systems   Review of Systems  Constitutional:  Negative for chills and fever.  Eyes:  Negative for visual disturbance.  Respiratory:  Negative for cough, chest tightness and shortness of breath.   Cardiovascular:  Negative for chest pain.  Gastrointestinal:  Negative for abdominal pain, diarrhea, nausea and vomiting.  Genitourinary:  Negative for pelvic pain.  Musculoskeletal:  Positive for arthralgias, back pain and myalgias. Negative for neck pain and neck stiffness.  Skin:  Negative for rash and wound.  Neurological:  Negative for dizziness, seizures, syncope, weakness, light-headedness and headaches.    Physical Exam Updated Vital Signs BP 131/85 (BP Location: Right Arm)   Pulse 77   Temp 98.3 F (36.8 C)   Resp 20   SpO2 (!) 88%  Physical Exam Vitals and nursing note reviewed.  Constitutional:      General: She is not in acute distress.    Appearance: Normal appearance. She is not ill-appearing, toxic-appearing or diaphoretic.  HENT:     Head: Normocephalic and atraumatic.  Eyes:     Extraocular Movements: Extraocular movements intact.  Cardiovascular:     Rate and Rhythm: Normal rate and regular rhythm.     Pulses: Normal pulses.     Heart sounds: No murmur heard. Pulmonary:     Effort: Pulmonary effort is normal.     Breath sounds: Normal breath sounds.  Musculoskeletal:     Cervical back: Normal range of motion. No tenderness. Normal range of motion.     Thoracic back: Tenderness present. Normal range of motion.     Lumbar back: No tenderness.     Right hip: Tenderness and bony tenderness present. No  deformity or lacerations.     Left hip: Normal.     Comments: Tenderness directly over thoracic spine, based on history and video shown by patient consist with where patient fell backward onto stairs after feet slipped out from under her  Patient denies injury or pain to head, neck, upper extremities, chest, or abdomen. No pain of bilateral knee or ankle, Extremities neurovascularly intact.    Neurological:     General: No focal deficit present.     Mental Status: She is alert and oriented to person, place, and time.     Motor: No weakness.     Coordination: Coordination is intact.     ED Results / Procedures / Treatments   Labs (  all labs ordered are listed, but only abnormal results are displayed) Labs Reviewed - No data to display  EKG None  Radiology DG Hip Unilat W or Wo Pelvis 2-3 Views Right Result Date: 04/21/2024 CLINICAL DATA:  Hip pain EXAM: DG HIP (WITH OR WITHOUT PELVIS) 2-3V RIGHT COMPARISON:  None Available. FINDINGS: There is no evidence of hip fracture or dislocation. There is no evidence of arthropathy or other focal bone abnormality. IMPRESSION: Negative. Electronically Signed   By: Fredrich Jefferson M.D.   On: 04/21/2024 10:11    Procedures Procedures    Medications Ordered in ED Medications  HYDROcodone -acetaminophen  (NORCO/VICODIN) 5-325 MG per tablet 1 tablet (1 tablet Oral Given 04/21/24 1055)    ED Course/ Medical Decision Making/ A&P    Patient presents to the ED for concern of mechanical fall, R hip pain, back pain this involves an extensive number of treatment options, and is a complaint that carries with it a high risk of complications and morbidity.  The differential diagnosis includes brain bleed, R hip fracture, hip dislocation, compression fracture of spine, sprain/strain, bruising, etc.    Co morbidities that complicate the patient evaluation  Chronic back pain   Imaging Studies ordered:  I ordered imaging studies including R hip xray,  thoracic spine xray  I independently visualized and interpreted imaging which showed no acute fractures I agree with the radiologist interpretation   Medicines ordered and prescription drug management: I ordered medication including Norco for pain Reevaluation of the patient after these medicines showed that the patient improved I have reviewed the patients home medicines and have made adjustments as needed   Test Considered:  Imaging of head: patient denies hitting head, video shown of fall shows patient not hitting head, no LOC, syncope, nausea, vomiting, no blood thinners - declined at this time   Critical Interventions:  none   Problem List / ED Course:  Mechanical fall, right hip pain, thoracic back pain Imaging of right hip and thoracic spine completed, shows no acute fractures or abnormalities Patient discharged and recommended to follow-up with primary care provider as scheduled or sooner if symptoms permit Patient feels that her pain will be well-controlled with at home meds, as she already sees pain management Return precautions given.   Reevaluation:  After the interventions noted above, I reevaluated the patient and found that they have :improved   Social Determinants of Health:  none   Dispostion:  After consideration of the diagnostic results and the patients response to treatment, I feel that the patent would benefit from discharge and follow-up as scheduled with PCP for ongoing diagnosis and treatment.  Click here for ABCD2, HEART and other calculatorsREFRESH Note before signing :1}                              Medical Decision Making Amount and/or Complexity of Data Reviewed Radiology: ordered.          Final Clinical Impression(s) / ED Diagnoses Final diagnoses:  None    Rx / DC Orders ED Discharge Orders     None         Fonda Hymen, PA-C 04/21/24 1357    Kingsley, Victoria K, DO 04/21/24 1452

## 2024-04-22 ENCOUNTER — Other Ambulatory Visit: Payer: Self-pay

## 2024-04-22 ENCOUNTER — Telehealth: Payer: Self-pay

## 2024-04-22 NOTE — Telephone Encounter (Signed)
 Pharmacy Patient Advocate Encounter  Received notification from [facility] MEDICAID that Prior Authorization for OZEMPIC  has been APPROVED from 04/22/2024 to 04/22/2025   PA #/Case ID/Reference #: [id number]

## 2024-04-23 ENCOUNTER — Telehealth: Payer: Self-pay | Admitting: Nurse Practitioner

## 2024-04-23 DIAGNOSIS — M549 Dorsalgia, unspecified: Secondary | ICD-10-CM | POA: Diagnosis not present

## 2024-04-23 MED ORDER — METHOCARBAMOL 500 MG PO TABS: 500.0000 mg | ORAL_TABLET | Freq: Four times a day (QID) | ORAL | 0 refills | Status: DC

## 2024-04-23 NOTE — Progress Notes (Signed)
 Virtual Visit Consent   [name], you are scheduled for a virtual visit with a [facility] provider today. Just as with appointments in the office, your consent must be obtained to participate. Your consent will be active for this visit and any virtual visit you may have with one of our providers in the next 365 days. If you have a MyChart account, a copy of this consent can be sent to you electronically.  As this is a virtual visit, video technology does not allow for your provider to perform a traditional examination. This may limit your provider's ability to fully assess your condition. If your provider identifies any concerns that need to be evaluated in person or the need to arrange testing (such as labs, EKG, etc.), we will make arrangements to do so. Although advances in technology are sophisticated, we cannot ensure that it will always work on either your end or our end. If the connection with a video visit is poor, the visit may have to be switched to a telephone visit. With either a video or telephone visit, we are not always able to ensure that we have a secure connection.  By engaging in this virtual visit, you consent to the provision of healthcare and authorize for your insurance to be billed (if applicable) for the services provided during this visit. Depending on your insurance coverage, you may receive a charge related to this service.  I need to obtain your verbal consent now. Are you willing to proceed with your visit today? [name] has provided verbal consent on 04/23/2024 for a virtual visit (video or telephone). Collins Dean, NP  Date: 04/23/2024 5:36 PM   Virtual Visit via Video Note   I, Collins Dean, connected with  Ellen Avery  ([id number], 1966-03-16) on 04/23/24 at  5:30 PM EDT by a video-enabled telemedicine application and verified that I am speaking with the correct person using two identifiers.  Location: Patient: Virtual Visit Location Patient:  Home Provider: Virtual Visit Location Provider: Home Office   I discussed the limitations of evaluation and management by telemedicine and the availability of in person appointments. The patient expressed understanding and agreed to proceed.    History of Present Illness: Ellen Avery is a 58 y.o. who identifies as a female who was assigned female at birth, and is being seen today for acute back pain.  Ms. her was seen in the emergency room on Apr 21, 2024 (2 days ago)  after a mechanical fall at home.    PER ED NOTE:  Patient was going to walk down the stairs outside of her apartment, and states that the stairs were wet and that her feet slid out from under her and she fell backwards onto the stairs.  She denies hitting her head, denies loss of consciousness, denies blood thinners.  Patient states that she is in pain at her right hip and back.  Patient ambulatory after the fall, states that she went back up to her apartment and soaked in Epsom salt bath, but the the pain continued so she decided to come to the emergency department.  Patient denies history of hip replacement.    Imaging was negative for any fractures or bony deformities.  She is currently taking her chronic medications for fibromyalgia but is requesting a muscle relaxant for her upper back pain which is ongoing since her fall.  She is currently taking tizanidine  which has not provided any relief of her back  pain.   Problems:  Patient Active Problem List   Diagnosis Date Noted   Dysuria 03/04/2024   Diabetic polyneuropathy associated with type 2 diabetes mellitus (HCC) 07/24/2023   Acute URI 07/24/2023   Annual physical exam 04/22/2023   Type 2 diabetes mellitus with diabetic autonomic neuropathy, with long-term current use of insulin  (HCC) 04/22/2023   Seasonal allergic rhinitis 04/22/2023   Tobacco abuse 01/21/2023   Anxiety 01/21/2023   Uncontrolled type 2 diabetes mellitus with hyperglycemia (HCC) 10/22/2022    Tobacco abuse counseling 10/22/2022   Leg pain, bilateral 10/22/2022   Fibromyalgia 10/22/2022   Chest pain in adult 07/20/2019   IDDM (insulin  dependent diabetes mellitus) 07/08/2018   HTN (hypertension) 07/08/2018   Dyslipidemia, goal LDL below 70 07/08/2018   Altered mental status 03/02/2016   MDD (major depressive disorder), recurrent episode, moderate (HCC) 03/02/2016   Psychoses (HCC)     Allergies:  Allergies  Allergen Reactions   Doxycycline  Hives and Itching   Flagyl [Metronidazole] Hives and Itching    Benadryl     Medications:  Current Outpatient Medications:    methocarbamol  (ROBAXIN ) 500 MG tablet, Take 1 tablet (500 mg total) by mouth 4 (four) times daily., Disp: 30 tablet, Rfl: 0   ACCU-CHEK GUIDE TEST test strip, Use as instructed, Disp: 300 strip, Rfl: 2   Alpha-Lipoic Acid 600 MG TABS, Take 600 mg by mouth daily., Disp: , Rfl:    amitriptyline  (ELAVIL ) 75 MG tablet, Take 1 tablet (75 mg total) by mouth at bedtime., Disp: 90 tablet, Rfl: 1   aspirin  EC 81 MG tablet, Take 81 mg by mouth daily., Disp: , Rfl:    cetirizine  (ZYRTEC ) 10 MG tablet, Take 1 tablet (10 mg total) by mouth daily., Disp: 90 tablet, Rfl: 2   Cholecalciferol (VITAMIN D3) 3000 units TABS, Take 3,000 Units by mouth daily as needed (vitamin d )., Disp: , Rfl:    Continuous Glucose Sensor (FREESTYLE LIBRE 3 SENSOR) MISC, APPLY 1 SENSOR ON TO THE SKIN EVERY 14 DAYS. USE TO CHECK GLUCOSE CONTINUOUSLY, Disp: 1 each, Rfl: 1   cyanocobalamin  (VITAMIN B12) 1000 MCG tablet, Take 1,000 mcg by mouth daily., Disp: , Rfl:    DULoxetine  (CYMBALTA ) 60 MG capsule, Take 1 capsule (60 mg total) by mouth daily., Disp: 90 capsule, Rfl: 2   ezetimibe  (ZETIA ) 10 MG tablet, Take 1 tablet (10 mg total) by mouth daily., Disp: 90 tablet, Rfl: 3   famotidine  (PEPCID ) 20 MG tablet, Take 1 tablet (20 mg total) by mouth every morning., Disp: 90 tablet, Rfl: 1   fenofibrate  (TRICOR ) 48 MG tablet, Take 1 tablet (48 mg total) by mouth  daily., Disp: 90 tablet, Rfl: 1   fluticasone  (FLONASE ) 50 MCG/ACT nasal spray, Place 2 sprays into both nostrils daily., Disp: 16 mL, Rfl: 5   gabapentin  (NEURONTIN ) 600 MG tablet, Take 1.5 tablets (900 mg total) by mouth 3 (three) times daily., Disp: 270 tablet, Rfl: 3   HYDROcodone -acetaminophen  (NORCO/VICODIN) 5-325 MG tablet, Take 1 tablet by mouth every 6 (six) hours as needed for moderate pain (pain score 4-6)., Disp: , Rfl:    hydrOXYzine  (ATARAX ) 25 MG tablet, Take 1 tablet (25 mg total) by mouth every 8 (eight) hours as needed. for anxiety, Disp: 90 tablet, Rfl: 1   insulin  glargine (LANTUS ) 100 UNIT/ML injection, Inject 0.75 mLs (75 Units total) into the skin daily., Disp: 10 mL, Rfl: 11   Insulin  Syringe-Needle U-100 25G X 1" 1 ML MISC, Inject 1 each into the skin daily.,  Disp: 100 each, Rfl: 3   LANTUS  SOLOSTAR 100 UNIT/ML Solostar Pen, Inject 65 Units into the skin at bedtime., Disp: , Rfl:    LINZESS  145 MCG CAPS capsule, Take 1 capsule (145 mcg total) by mouth every morning., Disp: 90 capsule, Rfl: 1   losartan -hydrochlorothiazide (HYZAAR) 100-12.5 MG tablet, Take 1 tablet by mouth daily., Disp: 90 tablet, Rfl: 1   rosuvastatin  (CRESTOR ) 40 MG tablet, Take 1 tablet (40 mg total) by mouth daily., Disp: 90 tablet, Rfl: 1   Semaglutide , 1 MG/DOSE, (OZEMPIC , 1 MG/DOSE,) 4 MG/3ML SOPN, Inject 1 mg as directed every 7 (seven) days., Disp: 3 mL, Rfl: 2   tiZANidine  (ZANAFLEX ) 4 MG tablet, Take 1 tablet (4 mg total) by mouth every 8 (eight) hours as needed., Disp: 30 tablet, Rfl: 0  Observations/Objective: Patient is well-developed, well-nourished in no acute distress.  Resting comfortably  at home.  Head is normocephalic, atraumatic.  No labored breathing.  Speech is clear and coherent with logical content.  Patient is alert and oriented at baseline.   Assessment and Plan: 1. Acute back pain less than 4 weeks duration (Primary) - methocarbamol  (ROBAXIN ) 500 MG tablet; Take 1 tablet  (500 mg total) by mouth 4 (four) times daily.  Dispense: 30 tablet; Refill: 0 Continue all other medications as prescribed.  Do not take tizanidine  with methocarbamol   Follow Up Instructions: I discussed the assessment and treatment plan with the patient. The patient was provided an opportunity to ask questions and all were answered. The patient agreed with the plan and demonstrated an understanding of the instructions.  A copy of instructions were sent to the patient via MyChart unless otherwise noted below.    The patient was advised to call back or seek an in-person evaluation if the symptoms worsen or if the condition fails to improve as anticipated.    Rockie Schnoor W Ananth Fiallos, NP

## 2024-04-23 NOTE — Patient Instructions (Signed)
 Si Draft, thank you for joining Collins Dean, NP for today's virtual visit.  While this provider is not your primary care provider (PCP), if your PCP is located in our provider database this encounter information will be shared with them immediately following your visit.   A [facility] MyChart account gives you access to today's visit and all your visits, tests, and labs performed at [facility] " click here if you don't have a  MyChart account or go to [email or web address]  Consent: (Patient) Ellen Avery provided verbal consent for this virtual visit at the beginning of the encounter.  Current Medications:  Current Outpatient Medications:    methocarbamol  (ROBAXIN ) 500 MG tablet, Take 1 tablet (500 mg total) by mouth 4 (four) times daily., Disp: 30 tablet, Rfl: 0   ACCU-CHEK GUIDE TEST test strip, Use as instructed, Disp: 300 strip, Rfl: 2   Alpha-Lipoic Acid 600 MG TABS, Take 600 mg by mouth daily., Disp: , Rfl:    amitriptyline  (ELAVIL ) 75 MG tablet, Take 1 tablet (75 mg total) by mouth at bedtime., Disp: 90 tablet, Rfl: 1   aspirin  EC 81 MG tablet, Take 81 mg by mouth daily., Disp: , Rfl:    cetirizine  (ZYRTEC ) 10 MG tablet, Take 1 tablet (10 mg total) by mouth daily., Disp: 90 tablet, Rfl: 2   Cholecalciferol (VITAMIN D3) 3000 units TABS, Take 3,000 Units by mouth daily as needed (vitamin d )., Disp: , Rfl:    Continuous Glucose Sensor (FREESTYLE LIBRE 3 SENSOR) MISC, APPLY 1 SENSOR ON TO THE SKIN EVERY 14 DAYS. USE TO CHECK GLUCOSE CONTINUOUSLY, Disp: 1 each, Rfl: 1   cyanocobalamin  (VITAMIN B12) 1000 MCG tablet, Take 1,000 mcg by mouth daily., Disp: , Rfl:    DULoxetine  (CYMBALTA ) 60 MG capsule, Take 1 capsule (60 mg total) by mouth daily., Disp: 90 capsule, Rfl: 2   ezetimibe  (ZETIA ) 10 MG tablet, Take 1 tablet (10 mg total) by mouth daily., Disp: 90 tablet, Rfl: 3   famotidine  (PEPCID ) 20 MG tablet, Take 1 tablet (20 mg total) by mouth every  morning., Disp: 90 tablet, Rfl: 1   fenofibrate  (TRICOR ) 48 MG tablet, Take 1 tablet (48 mg total) by mouth daily., Disp: 90 tablet, Rfl: 1   fluticasone  (FLONASE ) 50 MCG/ACT nasal spray, Place 2 sprays into both nostrils daily., Disp: 16 mL, Rfl: 5   gabapentin  (NEURONTIN ) 600 MG tablet, Take 1.5 tablets (900 mg total) by mouth 3 (three) times daily., Disp: 270 tablet, Rfl: 3   HYDROcodone -acetaminophen  (NORCO/VICODIN) 5-325 MG tablet, Take 1 tablet by mouth every 6 (six) hours as needed for moderate pain (pain score 4-6)., Disp: , Rfl:    hydrOXYzine  (ATARAX ) 25 MG tablet, Take 1 tablet (25 mg total) by mouth every 8 (eight) hours as needed. for anxiety, Disp: 90 tablet, Rfl: 1   insulin  glargine (LANTUS ) 100 UNIT/ML injection, Inject 0.75 mLs (75 Units total) into the skin daily., Disp: 10 mL, Rfl: 11   Insulin  Syringe-Needle U-100 25G X 1" 1 ML MISC, Inject 1 each into the skin daily., Disp: 100 each, Rfl: 3   LANTUS  SOLOSTAR 100 UNIT/ML Solostar Pen, Inject 65 Units into the skin at bedtime., Disp: , Rfl:    LINZESS  145 MCG CAPS capsule, Take 1 capsule (145 mcg total) by mouth every morning., Disp: 90 capsule, Rfl: 1   losartan -hydrochlorothiazide (HYZAAR) 100-12.5 MG tablet, Take 1 tablet by mouth daily., Disp: 90 tablet, Rfl: 1   rosuvastatin  (CRESTOR ) 40 MG tablet, Take  1 tablet (40 mg total) by mouth daily., Disp: 90 tablet, Rfl: 1   Semaglutide , 1 MG/DOSE, (OZEMPIC , 1 MG/DOSE,) 4 MG/3ML SOPN, Inject 1 mg as directed every 7 (seven) days., Disp: 3 mL, Rfl: 2   tiZANidine  (ZANAFLEX ) 4 MG tablet, Take 1 tablet (4 mg total) by mouth every 8 (eight) hours as needed., Disp: 30 tablet, Rfl: 0   Medications ordered in this encounter:  Meds ordered this encounter  Medications   methocarbamol  (ROBAXIN ) 500 MG tablet    Sig: Take 1 tablet (500 mg total) by mouth 4 (four) times daily.    Dispense:  30 tablet    Refill:  0    Supervising Provider:   LAMPTEY, PHILIP O [[id number]]     *If you need  refills on other medications prior to your next appointment, please contact your pharmacy*  Follow-Up: Call back or seek an in-person evaluation if the symptoms worsen or if the condition fails to improve as anticipated.  [facility] [phone]  Other Instructions Continue all other medications as prescribed.  Do not take tizanidine  with methocarbamol     If you have been instructed to have an in-person evaluation today at a local Urgent Care facility, please use the link below. It will take you to a list of all of our available [facility], including address, phone number and hours of operation. Please do not delay care.  [facility]  If you or a family member do not have a primary care provider, use the link below to schedule a visit and establish care. When you choose a [facility] primary care physician or advanced practice provider, you gain a long-term partner in health. Find a Primary Care Provider  Learn more about [facility]'s in-office and virtual care options: [place] - Get Care Now

## 2024-05-11 ENCOUNTER — Other Ambulatory Visit: Payer: Self-pay | Admitting: Nurse Practitioner

## 2024-05-11 ENCOUNTER — Other Ambulatory Visit: Payer: Self-pay | Admitting: Neurology

## 2024-05-11 DIAGNOSIS — M549 Dorsalgia, unspecified: Secondary | ICD-10-CM

## 2024-05-11 DIAGNOSIS — E1165 Type 2 diabetes mellitus with hyperglycemia: Secondary | ICD-10-CM

## 2024-05-11 DIAGNOSIS — Z794 Long term (current) use of insulin: Secondary | ICD-10-CM

## 2024-05-11 MED ORDER — METHOCARBAMOL 500 MG PO TABS
500.0000 mg | ORAL_TABLET | Freq: Four times a day (QID) | ORAL | 0 refills | Status: DC
Start: 2024-05-11 — End: 2024-06-06

## 2024-05-18 DIAGNOSIS — M79604 Pain in right leg: Secondary | ICD-10-CM | POA: Diagnosis not present

## 2024-05-18 DIAGNOSIS — Z6835 Body mass index (BMI) 35.0-35.9, adult: Secondary | ICD-10-CM | POA: Diagnosis not present

## 2024-05-18 DIAGNOSIS — M79605 Pain in left leg: Secondary | ICD-10-CM | POA: Diagnosis not present

## 2024-05-18 DIAGNOSIS — G894 Chronic pain syndrome: Secondary | ICD-10-CM | POA: Diagnosis not present

## 2024-05-18 DIAGNOSIS — Z79899 Other long term (current) drug therapy: Secondary | ICD-10-CM | POA: Diagnosis not present

## 2024-05-18 DIAGNOSIS — E6609 Other obesity due to excess calories: Secondary | ICD-10-CM | POA: Diagnosis not present

## 2024-05-18 DIAGNOSIS — M722 Plantar fascial fibromatosis: Secondary | ICD-10-CM | POA: Diagnosis not present

## 2024-05-18 DIAGNOSIS — I10 Essential (primary) hypertension: Secondary | ICD-10-CM | POA: Diagnosis not present

## 2024-05-20 DIAGNOSIS — Z79899 Other long term (current) drug therapy: Secondary | ICD-10-CM | POA: Diagnosis not present

## 2024-06-06 ENCOUNTER — Ambulatory Visit: Payer: Self-pay | Admitting: Nurse Practitioner

## 2024-06-06 ENCOUNTER — Other Ambulatory Visit: Payer: Self-pay | Admitting: Nurse Practitioner

## 2024-06-06 DIAGNOSIS — E1143 Type 2 diabetes mellitus with diabetic autonomic (poly)neuropathy: Secondary | ICD-10-CM

## 2024-06-06 DIAGNOSIS — F419 Anxiety disorder, unspecified: Secondary | ICD-10-CM

## 2024-06-06 DIAGNOSIS — M549 Dorsalgia, unspecified: Secondary | ICD-10-CM

## 2024-06-13 ENCOUNTER — Telehealth: Admitting: Physician Assistant

## 2024-06-13 DIAGNOSIS — K047 Periapical abscess without sinus: Secondary | ICD-10-CM | POA: Diagnosis not present

## 2024-06-13 MED ORDER — CLINDAMYCIN HCL 300 MG PO CAPS: 300.0000 mg | ORAL_CAPSULE | Freq: Three times a day (TID) | ORAL | 0 refills | Status: AC

## 2024-06-13 NOTE — Patient Instructions (Signed)
 [name], thank you for joining [name], PA-C for today's virtual visit.  While this provider is not your primary care provider (PCP), if your PCP is located in our provider database this encounter information will be shared with them immediately following your visit.   A [facility] MyChart account gives you access to today's visit and all your visits, tests, and labs performed at [facility]  click here if you don't have a [facility] MyChart account or go to [email or web address]  Consent: (Patient) Ellen Avery provided verbal consent for this virtual visit at the beginning of the encounter.  Current Medications:  Current Outpatient Medications:    clindamycin  (CLEOCIN ) 300 MG capsule, Take 1 capsule (300 mg total) by mouth 3 (three) times daily for 7 days., Disp: 21 capsule, Rfl: 0   ACCU-CHEK GUIDE TEST test strip, Use as instructed, Disp: 300 strip, Rfl: 2   Alpha-Lipoic Acid 600 MG TABS, Take 600 mg by mouth daily., Disp: , Rfl:    amitriptyline  (ELAVIL ) 75 MG tablet, TAKE 1 TABLET BY MOUTH AT BEDTIME, Disp: 90 tablet, Rfl: 1   aspirin  EC 81 MG tablet, Take 81 mg by mouth daily., Disp: , Rfl:    cetirizine  (ZYRTEC ) 10 MG tablet, Take 1 tablet (10 mg total) by mouth daily., Disp: 90 tablet, Rfl: 2   Cholecalciferol (VITAMIN D3) 3000 units TABS, Take 3,000 Units by mouth daily as needed (vitamin d )., Disp: , Rfl:    Continuous Glucose Sensor (FREESTYLE LIBRE 3 SENSOR) MISC, APPLY 1 SENSOR ON TO THE SKIN EVERY 14 DAYS. USE TO CHECK GLUCOSE CONTINUOUSLY, Disp: 2 each, Rfl: 1   cyanocobalamin  (VITAMIN B12) 1000 MCG tablet, Take 1,000 mcg by mouth daily., Disp: , Rfl:    DULoxetine  (CYMBALTA ) 60 MG capsule, Take 1 capsule (60 mg total) by mouth daily., Disp: 90 capsule, Rfl: 2   ezetimibe  (ZETIA ) 10 MG tablet, Take 1 tablet (10 mg total) by mouth daily., Disp: 90 tablet, Rfl: 3   famotidine  (PEPCID ) 20 MG tablet, Take 1 tablet by mouth every morning.,  Disp: 90 tablet, Rfl: 1   fenofibrate  (TRICOR ) 48 MG tablet, TAKE 1 TABLET BY MOUTH DAILY, Disp: 90 tablet, Rfl: 1   fluticasone  (FLONASE ) 50 MCG/ACT nasal spray, Place 2 sprays into both nostrils daily., Disp: 16 mL, Rfl: 5   gabapentin  (NEURONTIN ) 600 MG tablet, TAKE 1 AND 1/2 TABLETS BY MOUTH 3 TIMES DAILY, Disp: 270 tablet, Rfl: 3   HYDROcodone -acetaminophen  (NORCO/VICODIN) 5-325 MG tablet, Take 1 tablet by mouth every 6 (six) hours as needed for moderate pain (pain score 4-6)., Disp: , Rfl:    hydrOXYzine  (ATARAX ) 25 MG tablet, TAKE 1 TABLET BY MOUTH EVERY 8 HOURS AS NEEDED FOR ANXIETY, Disp: 90 tablet, Rfl: 1   insulin  glargine (LANTUS ) 100 UNIT/ML injection, Inject 0.75 mLs (75 Units total) into the skin daily., Disp: 10 mL, Rfl: 11   Insulin  Syringe-Needle U-100 25G X 1 1 ML MISC, Inject 1 each into the skin daily., Disp: 100 each, Rfl: 3   LANTUS  SOLOSTAR 100 UNIT/ML Solostar Pen, Inject 65 Units into the skin at bedtime., Disp: , Rfl:    LINZESS  145 MCG CAPS capsule, TAKE 1 CAPSULE BY MOUTH EVERY MORNING, Disp: 90 capsule, Rfl: 1   losartan -hydrochlorothiazide (HYZAAR) 100-12.5 MG tablet, Take 1 tablet by mouth daily., Disp: 90 tablet, Rfl: 1   methocarbamol  (ROBAXIN ) 500 MG tablet, Take 1 tablet (500 mg total) by mouth 4 (four) times daily., Disp: 30 tablet, Rfl:  0   OZEMPIC , 1 MG/DOSE, 4 MG/3ML SOPN, INJECT 1 MG below THE SKIN AS DIRECTED ONCE WEEKLY, Disp: 3 mL, Rfl: 2   rosuvastatin  (CRESTOR ) 40 MG tablet, Take 1 tablet (40 mg total) by mouth daily., Disp: 90 tablet, Rfl: 1   Medications ordered in this encounter:  Meds ordered this encounter  Medications   clindamycin  (CLEOCIN ) 300 MG capsule    Sig: Take 1 capsule (300 mg total) by mouth 3 (three) times daily for 7 days.    Dispense:  21 capsule    Refill:  0    Supervising Provider:   BLAISE ALEENE KIDD [[id number]]     *If you need refills on other medications prior to your next appointment, please contact your  pharmacy*  Follow-Up: Call back or seek an in-person evaluation if the symptoms worsen or if the condition fails to improve as anticipated.   Virtual Care [phone]  Other Instructions Dental Abscess  A dental abscess is an infection around a tooth that may involve pain, swelling, and a collection of pus, as well as other symptoms. Treatment is important to help with symptoms and to prevent the infection from spreading. The general types of dental abscesses are: Pulpal abscess. This abscess may form from the inner part of the tooth (pulp). Periodontal abscess. This abscess may form from the gum. What are the causes? This condition is caused by a bacterial infection in or around the tooth. It may result from: Severe tooth decay (cavities). Trauma to the tooth, such as a broken or chipped tooth. What increases the risk? This condition is more likely to develop in males. It is also more likely to develop in people who: Have cavities. Have severe gum disease. Eat sugary snacks between meals. Use tobacco products. Have diabetes. Have a weakened disease-fighting system (immune system). Do not brush and care for their teeth regularly. What are the signs or symptoms? Mild symptoms of this condition include: Tenderness. Bad breath. Fever. A bitter taste in the mouth. Pain in and around the infected tooth. Moderate symptoms of this condition include: Swollen neck glands. Chills. Pus drainage. Swelling and redness around the infected tooth, in the mouth, or in the face. Severe pain in and around the infected tooth. Severe symptoms of this condition include: Difficulty swallowing. Difficulty opening the mouth. Nausea. Vomiting. How is this diagnosed? This condition is diagnosed based on: Your symptoms and your medical and dental history. An examination of the infected tooth. During the exam, your dental care provider may tap on the infected tooth. You may also  need to have X-rays taken of the affected area. How is this treated? This condition is treated by getting rid of the infection. This may be done with: Antibiotic medicines. These may be used in certain situations. Antibacterial mouth rinse. Incision and drainage. This procedure is done by making an incision in the abscess to drain out the pus. Removing pus is the first priority in treating an abscess. A root canal. This may be performed to save the tooth. Your dental care provider accesses the visible part of your tooth (crown) with a drill and removes any infected pulp. Then the space is filled and sealed off. Tooth extraction. The tooth is pulled out if it cannot be saved by other treatment. You may also receive treatment for pain, such as: Acetaminophen  or NSAIDs. Gels that contain a numbing medicine. An injection to block the pain near your nerve. Follow these instructions at home: Medicines Take over-the-counter  and prescription medicines only as told by your dental care provider. If you were prescribed an antibiotic, take it as told by your dental care provider. Do not stop taking the antibiotic even if you start to feel better. If you were prescribed a gel that contains a numbing medicine, use it exactly as told in the directions. Do not use these gels for children who are younger than 55 years of age. Use an antibacterial mouth rinse as told by your dental care provider. General instructions  Gargle with a mixture of salt and water 3-4 times a day or as needed. To make salt water, completely dissolve -1 tsp (3-6 g) of salt in 1 cup (237 mL) of warm water. Eat a soft diet while your abscess is healing. Drink enough fluid to keep your urine pale yellow. Do not apply heat to the outside of your mouth. Do not use any products that contain nicotine  or tobacco. These products include cigarettes, chewing tobacco, and vaping devices, such as e-cigarettes. If you need help quitting, ask your  dental care provider. Keep all follow-up visits. This is important. How is this prevented?  Excellent dental home care, which includes brushing your teeth every morning and night with fluoride toothpaste. Floss one time each day. Get regularly scheduled dental cleanings. Consider having a dental sealant applied on teeth that have deep grooves to prevent cavities. Drink fluoridated water regularly. This includes most tap water. Check the label on bottled water to see if it contains fluoride. Reduce or eliminate sugary drinks. Eat healthy meals and snacks. Wear a mouth guard or face shield to protect your teeth while playing sports. Contact a health care provider if: Your pain is worse and is not helped by medicine. You have swelling. You see pus around the tooth. You have a fever or chills. Get help right away if: Your symptoms suddenly get worse. You have a very bad headache. You have problems breathing or swallowing. You have trouble opening your mouth. You have swelling in your neck or around your eye. These symptoms may represent a serious problem that is an emergency. Do not wait to see if the symptoms will go away. Get medical help right away. Call your local emergency services (911 in the [place] Do not drive yourself to the hospital. Summary A dental abscess is a collection of pus in or around a tooth that results from an infection. A dental abscess may result from severe tooth decay, trauma to the tooth, or severe gum disease around a tooth. Symptoms include severe pain, swelling, redness, and drainage of pus in and around the infected tooth. The first priority in treating a dental abscess is to drain out the pus. Treatment may also involve removing damage inside the tooth (root canal) or extracting the tooth. This information is not intended to replace advice given to you by your health care provider. Make sure you discuss any questions you have with your health care  provider. Document Revised: 01/31/2021 Document Reviewed: 01/31/2021 Elsevier Patient Education  2024 Elsevier Inc[employer]   If you have been instructed to have an in-person evaluation today at a local Urgent Care facility, please use the link below. It will take you to a list of all of our available [facility], including address, phone number and hours of operation. Please do not delay care.  [facility]  If you or a family member do not have a primary care provider, use the link below to schedule a  visit and establish care. When you choose a [facility] primary care physician or advanced practice provider, you gain a long-term partner in health. Find a Primary Care Provider  Learn more about [facility]'s in-office and virtual care options:  - Get Care Now

## 2024-06-13 NOTE — Progress Notes (Signed)
 Virtual Visit Consent   Ellen Avery, you are scheduled for a virtual visit with a [facility] provider today. Just as with appointments in the office, your consent must be obtained to participate. Your consent will be active for this visit and any virtual visit you may have with one of our providers in the next 365 days. If you have a MyChart account, a copy of this consent can be sent to you electronically.  As this is a virtual visit, video technology does not allow for your provider to perform a traditional examination. This may limit your provider's ability to fully assess your condition. If your provider identifies any concerns that need to be evaluated in person or the need to arrange testing (such as labs, EKG, etc.), we will make arrangements to do so. Although advances in technology are sophisticated, we cannot ensure that it will always work on either your end or our end. If the connection with a video visit is poor, the visit may have to be switched to a telephone visit. With either a video or telephone visit, we are not always able to ensure that we have a secure connection.  By engaging in this virtual visit, you consent to the provision of healthcare and authorize for your insurance to be billed (if applicable) for the services provided during this visit. Depending on your insurance coverage, you may receive a charge related to this service.  I need to obtain your verbal consent now. Are you willing to proceed with your visit today? Ellen Avery has provided verbal consent on 06/13/2024 for a virtual visit (video or telephone). [name], PA-C  Date: 06/13/2024 2:34 PM   Virtual Visit via Video Note   I, [name], connected with  Ellen Avery  ([id number], 1966/09/01) on 06/13/24 at  2:30 PM EDT by a video-enabled telemedicine application and verified that I am speaking with the correct person using two identifiers.  Location: Patient: Virtual Visit Location  Patient: Home Provider: Virtual Visit Location Provider: Home Office   I discussed the limitations of evaluation and management by telemedicine and the availability of in person appointments. The patient expressed understanding and agreed to proceed.    History of Present Illness: Ellen Avery is a 58 y.o. who identifies as a female who was assigned female at birth, and is being seen today for a dental infection.  HPI: Dental Pain  This is a new problem. The current episode started in the past 7 days (past few days). The problem occurs constantly. The problem has been gradually worsening. The pain is moderate. Associated symptoms include facial pain (left side), sinus pressure (left side) and thermal sensitivity. Pertinent negatives include no difficulty swallowing, fever or oral bleeding. She has tried NSAIDs and acetaminophen  (orajel) for the symptoms. The treatment provided no relief.     Problems:  Patient Active Problem List   Diagnosis Date Noted   Dysuria 03/04/2024   Diabetic polyneuropathy associated with type 2 diabetes mellitus (HCC) 07/24/2023   Acute URI 07/24/2023   Annual physical exam 04/22/2023   Type 2 diabetes mellitus with diabetic autonomic neuropathy, with long-term current use of insulin  (HCC) 04/22/2023   Seasonal allergic rhinitis 04/22/2023   Tobacco abuse 01/21/2023   Anxiety 01/21/2023   Uncontrolled type 2 diabetes mellitus with hyperglycemia (HCC) 10/22/2022   Tobacco abuse counseling 10/22/2022   Leg pain, bilateral 10/22/2022   Fibromyalgia 10/22/2022   Chest pain in adult 07/20/2019   IDDM (insulin   dependent diabetes mellitus) 07/08/2018   HTN (hypertension) 07/08/2018   Dyslipidemia, goal LDL below 70 07/08/2018   Altered mental status 03/02/2016   MDD (major depressive disorder), recurrent episode, moderate (HCC) 03/02/2016   Psychoses (HCC)     Allergies:  Allergies  Allergen Reactions   Doxycycline  Hives and Itching   Flagyl  [Metronidazole] Hives and Itching    Benadryl     Medications:  Current Outpatient Medications:    clindamycin  (CLEOCIN ) 300 MG capsule, Take 1 capsule (300 mg total) by mouth 3 (three) times daily for 7 days., Disp: 21 capsule, Rfl: 0   ACCU-CHEK GUIDE TEST test strip, Use as instructed, Disp: 300 strip, Rfl: 2   Alpha-Lipoic Acid 600 MG TABS, Take 600 mg by mouth daily., Disp: , Rfl:    amitriptyline  (ELAVIL ) 75 MG tablet, TAKE 1 TABLET BY MOUTH AT BEDTIME, Disp: 90 tablet, Rfl: 1   aspirin  EC 81 MG tablet, Take 81 mg by mouth daily., Disp: , Rfl:    cetirizine  (ZYRTEC ) 10 MG tablet, Take 1 tablet (10 mg total) by mouth daily., Disp: 90 tablet, Rfl: 2   Cholecalciferol (VITAMIN D3) 3000 units TABS, Take 3,000 Units by mouth daily as needed (vitamin d )., Disp: , Rfl:    Continuous Glucose Sensor (FREESTYLE LIBRE 3 SENSOR) MISC, APPLY 1 SENSOR ON TO THE SKIN EVERY 14 DAYS. USE TO CHECK GLUCOSE CONTINUOUSLY, Disp: 2 each, Rfl: 1   cyanocobalamin  (VITAMIN B12) 1000 MCG tablet, Take 1,000 mcg by mouth daily., Disp: , Rfl:    DULoxetine  (CYMBALTA ) 60 MG capsule, Take 1 capsule (60 mg total) by mouth daily., Disp: 90 capsule, Rfl: 2   ezetimibe  (ZETIA ) 10 MG tablet, Take 1 tablet (10 mg total) by mouth daily., Disp: 90 tablet, Rfl: 3   famotidine  (PEPCID ) 20 MG tablet, Take 1 tablet by mouth every morning., Disp: 90 tablet, Rfl: 1   fenofibrate  (TRICOR ) 48 MG tablet, TAKE 1 TABLET BY MOUTH DAILY, Disp: 90 tablet, Rfl: 1   fluticasone  (FLONASE ) 50 MCG/ACT nasal spray, Place 2 sprays into both nostrils daily., Disp: 16 mL, Rfl: 5   gabapentin  (NEURONTIN ) 600 MG tablet, TAKE 1 AND 1/2 TABLETS BY MOUTH 3 TIMES DAILY, Disp: 270 tablet, Rfl: 3   HYDROcodone -acetaminophen  (NORCO/VICODIN) 5-325 MG tablet, Take 1 tablet by mouth every 6 (six) hours as needed for moderate pain (pain score 4-6)., Disp: , Rfl:    hydrOXYzine  (ATARAX ) 25 MG tablet, TAKE 1 TABLET BY MOUTH EVERY 8 HOURS AS NEEDED FOR ANXIETY, Disp:  90 tablet, Rfl: 1   insulin  glargine (LANTUS ) 100 UNIT/ML injection, Inject 0.75 mLs (75 Units total) into the skin daily., Disp: 10 mL, Rfl: 11   Insulin  Syringe-Needle U-100 25G X 1 1 ML MISC, Inject 1 each into the skin daily., Disp: 100 each, Rfl: 3   LANTUS  SOLOSTAR 100 UNIT/ML Solostar Pen, Inject 65 Units into the skin at bedtime., Disp: , Rfl:    LINZESS  145 MCG CAPS capsule, TAKE 1 CAPSULE BY MOUTH EVERY MORNING, Disp: 90 capsule, Rfl: 1   losartan -hydrochlorothiazide (HYZAAR) 100-12.5 MG tablet, Take 1 tablet by mouth daily., Disp: 90 tablet, Rfl: 1   methocarbamol  (ROBAXIN ) 500 MG tablet, Take 1 tablet (500 mg total) by mouth 4 (four) times daily., Disp: 30 tablet, Rfl: 0   OZEMPIC , 1 MG/DOSE, 4 MG/3ML SOPN, INJECT 1 MG below THE SKIN AS DIRECTED ONCE WEEKLY, Disp: 3 mL, Rfl: 2   rosuvastatin  (CRESTOR ) 40 MG tablet, Take 1 tablet (40 mg total) by mouth  daily., Disp: 90 tablet, Rfl: 1  Observations/Objective: Patient is well-developed, well-nourished in no acute distress.  Resting comfortably at home.  Head is normocephalic, atraumatic.  No labored breathing.  Speech is clear and coherent with logical content.  Patient is alert and oriented at baseline.    Assessment and Plan: 1. Dental infection (Primary) - clindamycin  (CLEOCIN ) 300 MG capsule; Take 1 capsule (300 mg total) by mouth 3 (three) times daily for 7 days.  Dispense: 21 capsule; Refill: 0  - Suspected infection with broken tooth - Clindamycin  prescribed - Can use ice on outside jaw/cheek for swelling - Can also take tylenol  for pain with other medications - Schedule a follow with a dentist as soon as possible (Can contact [facility] if underinsured or uninsured) - Seek in person evaluation if symptoms fail to improve or if they worsen   Follow Up Instructions: I discussed the assessment and treatment plan with the patient. The patient was provided an opportunity to ask questions and all were  answered. The patient agreed with the plan and demonstrated an understanding of the instructions.  A copy of instructions were sent to the patient via MyChart unless otherwise noted below.    The patient was advised to call back or seek an in-person evaluation if the symptoms worsen or if the condition fails to improve as anticipated.    [name], PA-C

## 2024-06-20 DIAGNOSIS — I10 Essential (primary) hypertension: Secondary | ICD-10-CM | POA: Diagnosis not present

## 2024-06-20 DIAGNOSIS — M79605 Pain in left leg: Secondary | ICD-10-CM | POA: Diagnosis not present

## 2024-06-20 DIAGNOSIS — M79604 Pain in right leg: Secondary | ICD-10-CM | POA: Diagnosis not present

## 2024-06-20 DIAGNOSIS — M722 Plantar fascial fibromatosis: Secondary | ICD-10-CM | POA: Diagnosis not present

## 2024-06-20 DIAGNOSIS — G894 Chronic pain syndrome: Secondary | ICD-10-CM | POA: Diagnosis not present

## 2024-06-20 DIAGNOSIS — E6609 Other obesity due to excess calories: Secondary | ICD-10-CM | POA: Diagnosis not present

## 2024-06-20 DIAGNOSIS — Z6834 Body mass index (BMI) 34.0-34.9, adult: Secondary | ICD-10-CM | POA: Diagnosis not present

## 2024-06-20 DIAGNOSIS — Z79899 Other long term (current) drug therapy: Secondary | ICD-10-CM | POA: Diagnosis not present

## 2024-06-20 DIAGNOSIS — E1142 Type 2 diabetes mellitus with diabetic polyneuropathy: Secondary | ICD-10-CM | POA: Diagnosis not present

## 2024-06-23 DIAGNOSIS — Z79899 Other long term (current) drug therapy: Secondary | ICD-10-CM | POA: Diagnosis not present

## 2024-07-04 ENCOUNTER — Other Ambulatory Visit: Payer: Self-pay | Admitting: Nurse Practitioner

## 2024-07-04 DIAGNOSIS — Z794 Long term (current) use of insulin: Secondary | ICD-10-CM

## 2024-07-05 ENCOUNTER — Ambulatory Visit: Admitting: Nurse Practitioner

## 2024-07-08 ENCOUNTER — Ambulatory Visit (INDEPENDENT_AMBULATORY_CARE_PROVIDER_SITE_OTHER)

## 2024-07-08 ENCOUNTER — Encounter (HOSPITAL_COMMUNITY): Payer: Self-pay

## 2024-07-08 ENCOUNTER — Ambulatory Visit (HOSPITAL_COMMUNITY)
Admission: EM | Admit: 2024-07-08 | Discharge: 2024-07-08 | Disposition: A | Discharge status: Home / Self Care | Attending: Family Medicine | Admitting: Family Medicine

## 2024-07-08 DIAGNOSIS — M79671 Pain in right foot: Secondary | ICD-10-CM

## 2024-07-08 DIAGNOSIS — M722 Plantar fascial fibromatosis: Secondary | ICD-10-CM

## 2024-07-08 MED ORDER — KETOROLAC TROMETHAMINE 30 MG/ML IJ SOLN
30.0000 mg | Freq: Once | INTRAMUSCULAR | Status: AC
  Administered 2024-07-08: 30 mg via INTRAMUSCULAR

## 2024-07-08 MED ORDER — DICLOFENAC SODIUM 75 MG PO TBEC: 75.0000 mg | DELAYED_RELEASE_TABLET | Freq: Two times a day (BID) | ORAL | 0 refills | Status: DC | PRN

## 2024-07-08 MED ORDER — KETOROLAC TROMETHAMINE 30 MG/ML IJ SOLN
INTRAMUSCULAR | Status: AC
  Filled 2024-07-08: qty 1

## 2024-07-08 NOTE — ED Provider Notes (Addendum)
 [facility]    CSN: [id number] Arrival date & time: 07/08/24  1809      History   Chief Complaint Chief Complaint  Patient presents with   Foot Pain    HPI Ellen Avery is a 58 y.o. female.    Foot Pain  Here for pain in her right heel for 3 weeks.  No known trauma or injury.  No fever and no redness.  The pain is in the anterior aspect of her right heel pad.  She states she was given a Z-Pak for infection or inflammation recently.  When we went over it again and again, it turns out that it was actually a prednisone  steroid pack or a Medrol Dosepak.  It did not help.  She states several years ago she had a heel spur and saw podiatry.  She had an injection in her foot  She has diabetes and her sugars lately are in the 100s.  She is allergic to Flagyl and doxycycline , both of which cause itching and rash.      Past Medical History:  Diagnosis Date   Anxiety    Arthritis    Chronic headaches    DDD (degenerative disc disease), cervical    Fibromyalgia    GERD (gastroesophageal reflux disease)    History of colon polyps    History of psychosis    Hypercholesteremia    Hypertension    IBS (irritable bowel syndrome)    IBS (irritable bowel syndrome)    MDD (major depressive disorder)    Right sided facial pain    Seasonal allergic rhinitis    Sinus congestion    12-24-2018 per pt no fever   Type 2 diabetes mellitus treated with insulin  (HCC)    Vitamin D  deficiency    Wears dentures    full upper,  partial lower   Wears glasses     Patient Active Problem List   Diagnosis Date Noted   Dysuria 03/04/2024   Diabetic polyneuropathy associated with type 2 diabetes mellitus (HCC) 07/24/2023   Acute URI 07/24/2023   Annual physical exam 04/22/2023   Type 2 diabetes mellitus with diabetic autonomic neuropathy, with long-term current use of insulin  (HCC) 04/22/2023   Seasonal allergic rhinitis 04/22/2023   Tobacco abuse 01/21/2023   Anxiety  01/21/2023   Uncontrolled type 2 diabetes mellitus with hyperglycemia (HCC) 10/22/2022   Tobacco abuse counseling 10/22/2022   Leg pain, bilateral 10/22/2022   Fibromyalgia 10/22/2022   Chest pain in adult 07/20/2019   IDDM (insulin  dependent diabetes mellitus) 07/08/2018   HTN (hypertension) 07/08/2018   Dyslipidemia, goal LDL below 70 07/08/2018   Altered mental status 03/02/2016   MDD (major depressive disorder), recurrent episode, moderate (HCC) 03/02/2016   Psychoses (HCC)     Past Surgical History:  Procedure Laterality Date   ARTERY BIOPSY Right 12/28/2018   Procedure: RIGHT BIOPSY TEMPORAL ARTERY;  Surgeon: Stevie, Herlene Righter, MD;  Location: [facility];  Service: General;  Laterality: Right;   CARDIAC CATHETERIZATION     MULTIPLE TOOTH EXTRACTIONS  04/ 2019    sedation   TOTAL ABDOMINAL HYSTERECTOMY W/ BILATERAL SALPINGOOPHORECTOMY  2009    OB History   No obstetric history on file.      Home Medications    Prior to Admission medications   Medication Sig Start Date End Date Taking? Authorizing Provider  diclofenac  (VOLTAREN ) 75 MG EC tablet Take 1 tablet (75 mg total) by mouth 2 (two) times daily as needed (  pain). 07/08/24  Yes Haylynn Pha, Sharlet POUR, MD  HYDROcodone -acetaminophen  (NORCO/VICODIN) 5-325 MG tablet Take 1 tablet by mouth every 6 (six) hours as needed for moderate pain (pain score 4-6).   Yes [provider]  ACCU-CHEK GUIDE TEST test strip Use as instructed 03/08/24   Paseda, Folashade R, FNP  amitriptyline  (ELAVIL ) 75 MG tablet TAKE 1 TABLET BY MOUTH AT BEDTIME 06/06/24   Paseda, Folashade R, FNP  aspirin  EC 81 MG tablet Take 81 mg by mouth daily.    [provider]  cetirizine  (ZYRTEC ) 10 MG tablet Take 1 tablet (10 mg total) by mouth daily. 03/08/24   Paseda, Folashade R, FNP  Cholecalciferol (VITAMIN D3) 3000 units TABS Take 3,000 Units by mouth daily as needed (vitamin d ).    [provider]  Continuous Glucose  Sensor (FREESTYLE LIBRE 3 SENSOR) MISC APPLY 1 SENSOR ON TO THE SKIN EVERY 14 DAYS. USE TO CHECK GLUCOSE CONTINUOUSLY 07/04/24   Paseda, Folashade R, FNP  cyanocobalamin  (VITAMIN B12) 1000 MCG tablet Take 1,000 mcg by mouth daily.    [provider]  DULoxetine  (CYMBALTA ) 60 MG capsule Take 1 capsule (60 mg total) by mouth daily. 03/08/24   Paseda, Folashade R, FNP  ezetimibe  (ZETIA ) 10 MG tablet Take 1 tablet (10 mg total) by mouth daily. 03/08/24 03/08/25  Paseda, Folashade R, FNP  famotidine  (PEPCID ) 20 MG tablet Take 1 tablet by mouth every morning. 06/06/24   Paseda, Folashade R, FNP  fenofibrate  (TRICOR ) 48 MG tablet TAKE 1 TABLET BY MOUTH DAILY 06/06/24   Paseda, Folashade R, FNP  fluticasone  (FLONASE ) 50 MCG/ACT nasal spray Place 2 sprays into both nostrils daily. 03/08/24   Paseda, Folashade R, FNP  gabapentin  (NEURONTIN ) 600 MG tablet TAKE 1 AND 1/2 TABLETS BY MOUTH 3 TIMES DAILY 05/12/24   Camara, Amadou, MD  hydrOXYzine  (ATARAX ) 25 MG tablet TAKE 1 TABLET BY MOUTH EVERY 8 HOURS AS NEEDED FOR ANXIETY 06/06/24   Paseda, Folashade R, FNP  Insulin  Syringe-Needle U-100 25G X 1 1 ML MISC Inject 1 each into the skin daily. 03/04/24   Paseda, Folashade R, FNP  LANTUS  SOLOSTAR 100 UNIT/ML Solostar Pen Inject 65 Units into the skin at bedtime. 03/08/24   Paseda, Folashade R, FNP  LINZESS  145 MCG CAPS capsule TAKE 1 CAPSULE BY MOUTH EVERY MORNING 06/06/24   Paseda, Folashade R, FNP  losartan -hydrochlorothiazide (HYZAAR) 100-12.5 MG tablet Take 1 tablet by mouth daily. 03/08/24   Paseda, Folashade R, FNP  methocarbamol  (ROBAXIN ) 500 MG tablet Take 1 tablet (500 mg total) by mouth 4 (four) times daily. 06/06/24   Paseda, Folashade R, FNP  OZEMPIC , 1 MG/DOSE, 4 MG/3ML SOPN INJECT 1 MG below THE SKIN AS DIRECTED ONCE WEEKLY 05/12/24   Paseda, Folashade R, FNP  rosuvastatin  (CRESTOR ) 40 MG tablet Take 1 tablet (40 mg total) by mouth daily. 03/08/24   Paseda, Folashade R, FNP    Family History Family History   Problem Relation Age of Onset   Diabetes Mother    Heart disease Mother    High blood pressure Mother    Hyperlipidemia Mother    Diabetes Mellitus II Father    Heart Problems Brother    Breast cancer Maternal Aunt    Cervical cancer Paternal Aunt    Diabetes Paternal Uncle    Heart disease Paternal Grandmother    Breast cancer Paternal Grandmother    Head & neck cancer Cousin    Colon cancer Neg Hx    Colon polyps Neg Hx  Esophageal cancer Neg Hx    Rectal cancer Neg Hx    Stomach cancer Neg Hx     Social History Social History   Tobacco Use   Smoking status: Every Day    Current packs/day: 1.00    Average packs/day: 1 pack/day for 35.0 years (35.0 ttl pk-yrs)    Types: Cigarettes   Smokeless tobacco: Never  Vaping Use   Vaping status: Never Used  Substance Use Topics   Alcohol use: No   Drug use: No     Allergies   Doxycycline  and Flagyl [metronidazole]   Review of Systems Review of Systems   Physical Exam Triage Vital Signs ED Triage Vitals  Encounter Vitals Group     BP 07/08/24 1857 119/83     Girls Systolic BP Percentile --      Girls Diastolic BP Percentile --      Boys Systolic BP Percentile --      Boys Diastolic BP Percentile --      Pulse Rate 07/08/24 1857 83     Resp 07/08/24 1857 16     Temp 07/08/24 1857 98.5 F (36.9 C)     Temp Source 07/08/24 1857 Oral     SpO2 07/08/24 1857 96 %     Weight --      Height --      Head Circumference --      Peak Flow --      Pain Score 07/08/24 1858 7     Pain Loc --      Pain Education --      Exclude from Growth Chart --    No data found.  Updated Vital Signs BP 119/83 (BP Location: Left Arm)   Pulse 83   Temp 98.5 F (36.9 C) (Oral)   Resp 16   SpO2 96%   Visual Acuity Right Eye Distance:   Left Eye Distance:   Bilateral Distance:    Right Eye Near:   Left Eye Near:    Bilateral Near:     Physical Exam Vitals reviewed.  Constitutional:      General: She is not in acute  distress.    Appearance: She is not toxic-appearing.  Musculoskeletal:     Comments: There is some tenderness of the anterior portion of the right heel pad of the foot.  There is no erythema or ulceration.  I do not see any sign of a wart.  There is some callus on the heel.  Pulses are normal in that foot.  Skin:    Coloration: Skin is not jaundiced or pale.  Neurological:     General: No focal deficit present.     Mental Status: She is alert and oriented to person, place, and time.  Psychiatric:        Behavior: Behavior normal.      UC Treatments / Results  Labs (all labs ordered are listed, but only abnormal results are displayed) Labs Reviewed - No data to display  EKG   Radiology DG Foot Complete Right Result Date: 07/08/2024 CLINICAL DATA:  Right-sided heel pain for 3 weeks EXAM: RIGHT FOOT COMPLETE - 3+ VIEW COMPARISON:  None Available. FINDINGS: There is no evidence of fracture or dislocation. There is no evidence of arthropathy or other focal bone abnormality. Soft tissues are unremarkable. IMPRESSION: Negative. Electronically Signed   By: Luke Bun M.D.   On: 07/08/2024 19:39    Procedures Procedures (including critical care time)  Medications Ordered in UC Medications  ketorolac  (TORADOL ) 30 MG/ML injection 30 mg (30 mg Intramuscular Given 07/08/24 2001)    Initial Impression / Assessment and Plan / UC Course  I have reviewed the triage vital signs and the nursing notes.  Pertinent labs & imaging results that were available during my care of the patient were reviewed by me and considered in my medical decision making (see chart for details).     I discussed with the patient what I think is going on with her is plantar fasciitis.  We discussed considering a 5-day burst of prednisone .  This was before understood that she actually taken a steroid pack and not an antibiotic azithromycin .  She stated she did not do well with prednisone  and that it elevates her  sugars; please note that she had just told me that her sugars were in the 100s in the last couple of weeks.  We then discussed considering Toradol  injection or Toradol  tablets.  She is concerned that she will not be able to get into podiatry or her PCP before medication runs out.  She was then incredulous that she did not need an x-ray and she was incredulous that a heel spur could last forever.  X-ray was therefore ordered to address her concerns.  X-rays are normal and do not even show heel spur.  eGFR in March of this year was 24. Toradol  injection is given here tonight and prescription strength diclofenac  is sent in for pain.  I have asked her to follow-up with her primary care and she is given contact information for podiatry  I have discussed with her that physical therapy can help and as she has had in the past injections can help.  At discharge patient told staff she wanted a boot.  The boot is ordered to satisfy her request   Then when another staff member went to take the patient a boot, she and her family member state that oh no not that 1 it turns out that they want a postop shoe.  That order is placed instead Final Clinical Impressions(s) / UC Diagnoses   Final diagnoses:  Pain of right heel  Plantar fasciitis of right foot     Discharge Instructions      Your x-rays do not show any bony abnormality.  You have been given a shot of Toradol  30 mg today.  Diclofenac  75 mg--1 tablet 2 times daily as needed for pain  Please follow-up with your primary care and call the podiatry office for an evaluation.     ED Prescriptions     Medication Sig Dispense Auth. Provider   diclofenac  (VOLTAREN ) 75 MG EC tablet Take 1 tablet (75 mg total) by mouth 2 (two) times daily as needed (pain). 30 tablet Geralynn Capri K, MD      PDMP not reviewed this encounter.   Vonna Sharlet POUR, MD 07/08/24 DESMA    Vonna Sharlet POUR, MD 07/08/24 VITO    Vonna Sharlet POUR,  MD 07/08/24 2006

## 2024-07-08 NOTE — Discharge Instructions (Addendum)
 Your x-rays do not show any bony abnormality.  You have been given a shot of Toradol  30 mg today.  Diclofenac  75 mg--1 tablet 2 times daily as needed for pain  Please follow-up with your primary care and call the podiatry office for an evaluation.

## 2024-07-08 NOTE — ED Triage Notes (Signed)
 Patient here today with c/o right heel pain X 3 weeks. Patient has taken a Z-pak with no relief.

## 2024-07-21 DIAGNOSIS — E6609 Other obesity due to excess calories: Secondary | ICD-10-CM | POA: Diagnosis not present

## 2024-07-21 DIAGNOSIS — Z79899 Other long term (current) drug therapy: Secondary | ICD-10-CM | POA: Diagnosis not present

## 2024-07-21 DIAGNOSIS — M79605 Pain in left leg: Secondary | ICD-10-CM | POA: Diagnosis not present

## 2024-07-21 DIAGNOSIS — Z6834 Body mass index (BMI) 34.0-34.9, adult: Secondary | ICD-10-CM | POA: Diagnosis not present

## 2024-07-21 DIAGNOSIS — M79604 Pain in right leg: Secondary | ICD-10-CM | POA: Diagnosis not present

## 2024-07-21 DIAGNOSIS — Z780 Asymptomatic menopausal state: Secondary | ICD-10-CM | POA: Diagnosis not present

## 2024-07-21 DIAGNOSIS — Z1211 Encounter for screening for malignant neoplasm of colon: Secondary | ICD-10-CM | POA: Diagnosis not present

## 2024-07-21 DIAGNOSIS — E1142 Type 2 diabetes mellitus with diabetic polyneuropathy: Secondary | ICD-10-CM | POA: Diagnosis not present

## 2024-07-21 DIAGNOSIS — G894 Chronic pain syndrome: Secondary | ICD-10-CM | POA: Diagnosis not present

## 2024-07-21 DIAGNOSIS — I10 Essential (primary) hypertension: Secondary | ICD-10-CM | POA: Diagnosis not present

## 2024-07-28 ENCOUNTER — Telehealth: Payer: Self-pay | Admitting: Nurse Practitioner

## 2024-07-28 NOTE — Telephone Encounter (Signed)
 Patient comments: Medicine refills home test for colonoscopy

## 2024-08-02 ENCOUNTER — Other Ambulatory Visit: Payer: Self-pay

## 2024-08-02 ENCOUNTER — Telehealth (INDEPENDENT_AMBULATORY_CARE_PROVIDER_SITE_OTHER): Admitting: Nurse Practitioner

## 2024-08-02 ENCOUNTER — Telehealth: Payer: Self-pay

## 2024-08-02 ENCOUNTER — Encounter: Payer: Self-pay | Admitting: Nurse Practitioner

## 2024-08-02 ENCOUNTER — Ambulatory Visit: Admitting: Nurse Practitioner

## 2024-08-02 VITALS — BP 135/88 | HR 79 | Ht 67.0 in | Wt 221.0 lb

## 2024-08-02 DIAGNOSIS — M79672 Pain in left foot: Secondary | ICD-10-CM

## 2024-08-02 DIAGNOSIS — Z794 Long term (current) use of insulin: Secondary | ICD-10-CM

## 2024-08-02 DIAGNOSIS — M549 Dorsalgia, unspecified: Secondary | ICD-10-CM

## 2024-08-02 DIAGNOSIS — M79671 Pain in right foot: Secondary | ICD-10-CM

## 2024-08-02 DIAGNOSIS — G4700 Insomnia, unspecified: Secondary | ICD-10-CM | POA: Insufficient documentation

## 2024-08-02 DIAGNOSIS — G8929 Other chronic pain: Secondary | ICD-10-CM | POA: Diagnosis not present

## 2024-08-02 DIAGNOSIS — E1143 Type 2 diabetes mellitus with diabetic autonomic (poly)neuropathy: Secondary | ICD-10-CM

## 2024-08-02 MED ORDER — LOSARTAN POTASSIUM-HCTZ 100-12.5 MG PO TABS: 1.0000 | ORAL_TABLET | Freq: Every day | ORAL | 1 refills | Status: DC

## 2024-08-02 MED ORDER — LINZESS 145 MCG PO CAPS: 145.0000 ug | ORAL_CAPSULE | Freq: Every morning | ORAL | 1 refills | Status: AC

## 2024-08-02 MED ORDER — METHOCARBAMOL 500 MG PO TABS: 500.0000 mg | ORAL_TABLET | Freq: Four times a day (QID) | ORAL | 0 refills | Status: DC

## 2024-08-02 MED ORDER — DULOXETINE HCL 60 MG PO CPEP: 60.0000 mg | ORAL_CAPSULE | Freq: Every day | ORAL | 2 refills | Status: DC

## 2024-08-02 NOTE — Assessment & Plan Note (Signed)
 Lab Results  Component Value Date   HGBA1C 7.7 (A) 03/04/2024   Type 2 diabetes mellitus with diabetic autonomic neuropathy Type 2 diabetes with diabetic autonomic neuropathy. Current medications include duloxetine  and amitriptyline  and gabapentin  for neuropathic pain. Concerns about pain management and potential medication interactions. Difficulty using glucose monitoring device due to low readings. - Bring glucose monitoring device to next appointment for assistance with usage. Continue Lantus  65 units at bedtime, Ozempic  1 mg once weekly

## 2024-08-02 NOTE — Assessment & Plan Note (Signed)
  Chronic bilateral foot pain, primarily in the right heel, persisting for almost three months. Pain persists despite prednisone  use. No open ulcers or wounds. - Refer to podiatrist for evaluation of foot pain. DG right foot done recently showed there is no evidence of fracture or dislocation. There is no evidence of arthropathy or other focal bone abnormality. Soft tissues are unremarkable.

## 2024-08-02 NOTE — Assessment & Plan Note (Signed)
 Chronic insomnia Chronic insomnia with frequent awakenings. No recent symptoms of snoring or sleep apnea. Last sleep study over 15-20 years ago. - Order home sleep study to rule out sleep apnea. - Recommend melatonin 3-5 mg over the counter for sleep.

## 2024-08-02 NOTE — Patient Instructions (Addendum)
  1.Chronic pain of both feet  - Ambulatory referral to Podiatry  2. Insomnia, unspecified type  - Home sleep test; Future  Please take over-the-counter melatonin 3 mg at bedtime as needed  Please maintain simple sleep hygiene. - Maintain dark and non-noisy environment in the bedroom. - Please use the bedroom for sleep and sexual activity only. - Do not use electronic devices in the bedroom. - Please take dinner at least 2 hours before bedtime. - Please avoid caffeinated products in the evening, including coffee, soft drinks. - Please try to maintain the regular sleep-wake cycle - Go to bed and wake up at the same time.     It is important that you exercise regularly at least 30 minutes 5 times a week as tolerated  Think about what you will eat, plan ahead. Choose  clean, green, fresh or frozen over canned, processed or packaged foods which are more sugary, salty and fatty. 70 to 75% of food eaten should be vegetables and fruit. Three meals at set times with snacks allowed between meals, but they must be fruit or vegetables. Aim to eat over a 12 hour period , example 7 am to 7 pm, and STOP after  your last meal of the day. Drink water,generally about 64 ounces per day, no other drink is as healthy. Fruit juice is best enjoyed in a healthy way, by EATING the fruit.  Thanks for choosing [facility] we consider it a privelige to serve you.

## 2024-08-02 NOTE — Telephone Encounter (Signed)
 Copied from CRM [id number]. Topic: Clinical - Lab/Test Results >> Aug 02, 2024 11:59 AM Ellen Avery wrote: Reason for CRM: Patient would like a call back from the nurse to discuss some kidney test results she has received from another office//  Pt was about labs were seen in [facility]

## 2024-08-02 NOTE — Telephone Encounter (Signed)
 Done spoke to pt. Kh

## 2024-08-02 NOTE — Progress Notes (Signed)
 Virtual Visit via video  Note  I connected with Ellen Avery @ on 08/02/24 at 1110 am by video  and verified that I am speaking with the correct person using two identifiers. I spent 10 minutes on this video encounter  Location: Patient: home Provider: office   I discussed the limitations, risks, security and privacy concerns of performing an evaluation and management service by telephone and the availability of in person appointments. I also discussed with the patient that there may be a patient responsible charge related to this service. The patient expressed understanding and agreed to proceed.   History of Present Illness: Discussed the use of AI scribe software for clinical note transcription with the patient, who gave verbal consent to proceed.  History of Present Illness Ellen Avery is a 58 year old female  has a past medical history of Allergy (At 58 years old), Anxiety, Arthritis, Chronic headaches, DDD (degenerative disc disease), cervical, Fibromyalgia, GERD (gastroesophageal reflux disease), History of colon polyps, History of psychosis, Hypercholesteremia, Hypertension, IBS (irritable bowel syndrome), IBS (irritable bowel syndrome), MDD (major depressive disorder), Right sided facial pain, Seasonal allergic rhinitis, Sinus congestion, Type 2 diabetes mellitus treated with insulin  (HCC), Vitamin D  deficiency, Wears dentures, and Wears glasses.  who presents with complaints of bilateral foot pain, insomnia   She experiences significant sleep disturbances, characterized by waking up every 45 minutes or less. She describes herself as a 'real hyper' person, which has historically made it difficult for her to sleep. She is on amitriptyline  75mg  daily for nerve pain, this medication had helped her in the past for insomnia but not currently No snoring or recent episodes of apnea have been reported, although she had a sleep study over 15-20 years ago.  She has been experiencing heel pain,  described as heel spurs, in her right heel for almost three months. The pain persists despite previous treatment with prednisone , which provided only temporary relief. She has also experienced similar pain in both feet. She reports that an x-ray was performed at urgent care for her heel pain, but she is unsure of the results. For neuropathy she is currently on multiple medications, including duloxetine  (Cymbalta ) 60 mg, daily, gabapentin  900 mg 3 times daily, Norco 04/09/2024 1 tablet every 6 hours as needed and amitriptyline  75 mg daily she expresses concern about the potential for addiction due to long-term pain management. She also mentions having a history of chronic pain and neuropathy, which has been managed with various medications over the years.  She is followed by neurology and pain management  She mentions a previous colonoscopy in 2019 and was informed that she is not due for another until 2029. No gastrointestinal symptoms such as stomach problems or constipation.    Assessment & Plan      Observations/Objective: Patient tolerated no sign of distress noted   Assessment and Plan:  Chronic pain of both feet  Chronic bilateral foot pain, primarily in the right heel, persisting for almost three months. Pain persists despite prednisone  use. No open ulcers or wounds. - Refer to podiatrist for evaluation of foot pain. DG right foot done recently showed there is no evidence of fracture or dislocation. There is no evidence of arthropathy or other focal bone abnormality. Soft tissues are unremarkable.  Insomnia Chronic insomnia Chronic insomnia with frequent awakenings. No recent symptoms of snoring or sleep apnea. Last sleep study over 15-20 years ago. - Order home sleep study to rule out sleep apnea. - Recommend melatonin 3-5 mg  over the counter for sleep.  Type 2 diabetes mellitus with diabetic autonomic neuropathy, with long-term current use of insulin  (HCC) Lab Results   Component Value Date   HGBA1C 7.7 (A) 03/04/2024   Type 2 diabetes mellitus with diabetic autonomic neuropathy Type 2 diabetes with diabetic autonomic neuropathy. Current medications include duloxetine  and amitriptyline  and gabapentin  for neuropathic pain. Concerns about pain management and potential medication interactions. Difficulty using glucose monitoring device due to low readings. - Bring glucose monitoring device to next appointment for assistance with usage. Continue Lantus  65 units at bedtime, Ozempic  1 mg once weekly    Follow Up Instructions:    I discussed the assessment and treatment plan with the patient. The patient was provided an opportunity to ask questions and all were answered. The patient agreed with the plan and demonstrated an understanding of the instructions.   The patient was advised to call back or seek an in-person evaluation if the symptoms worsen or if the condition fails to improve as anticipated.

## 2024-08-11 ENCOUNTER — Telehealth: Payer: Self-pay

## 2024-08-11 ENCOUNTER — Telehealth: Admitting: Physician Assistant

## 2024-08-11 ENCOUNTER — Other Ambulatory Visit: Payer: Self-pay

## 2024-08-11 DIAGNOSIS — B9689 Other specified bacterial agents as the cause of diseases classified elsewhere: Secondary | ICD-10-CM | POA: Diagnosis not present

## 2024-08-11 DIAGNOSIS — J019 Acute sinusitis, unspecified: Secondary | ICD-10-CM | POA: Diagnosis not present

## 2024-08-11 MED ORDER — AMOXICILLIN-POT CLAVULANATE 875-125 MG PO TABS: 1.0000 | ORAL_TABLET | Freq: Two times a day (BID) | ORAL | 0 refills | Status: DC

## 2024-08-11 MED ORDER — FLUTICASONE PROPIONATE 50 MCG/ACT NA SUSP
2.0000 | Freq: Every day | NASAL | 0 refills | Status: AC
Start: 2024-08-11 — End: ?

## 2024-08-11 NOTE — Progress Notes (Signed)
 Virtual Visit Consent   Ellen Avery, you are scheduled for a virtual visit with a [name] provider today. Just as with appointments in the office, your consent must be obtained to participate. Your consent will be active for this visit and any virtual visit you may have with one of our providers in the next 365 days. If you have a MyChart account, a copy of this consent can be sent to you electronically.  As this is a virtual visit, video technology does not allow for your provider to perform a traditional examination. This may limit your provider's ability to fully assess your condition. If your provider identifies any concerns that need to be evaluated in person or the need to arrange testing (such as labs, EKG, etc.), we will make arrangements to do so. Although advances in technology are sophisticated, we cannot ensure that it will always work on either your end or our end. If the connection with a video visit is poor, the visit may have to be switched to a telephone visit. With either a video or telephone visit, we are not always able to ensure that we have a secure connection.  By engaging in this virtual visit, you consent to the provision of healthcare and authorize for your insurance to be billed (if applicable) for the services provided during this visit. Depending on your insurance coverage, you may receive a charge related to this service.  I need to obtain your verbal consent now. Are you willing to proceed with your visit today? LACRECIA DELVAL has provided verbal consent on 08/11/2024 for a virtual visit (video or telephone). [name], PA-C  Date: 08/11/2024 10:23 AM   Virtual Visit via Video Note   I, [name], connected with  Ellen Avery  ([id number], 05/19/1966) on 08/11/24 at 10:15 AM EDT by a video-enabled telemedicine application and verified that I am speaking with the correct person using two identifiers.  Location: Patient: Virtual Visit Location  Patient: Home Provider: Virtual Visit Location Provider: Home Office   I discussed the limitations of evaluation and management by telemedicine and the availability of in person appointments. The patient expressed understanding and agreed to proceed.    History of Present Illness: Ellen Avery is a 58 y.o. who identifies as a female who was assigned female at birth, and is being seen today for sinus congestion.  HPI: Sinusitis This is a new problem. The current episode started 1 to 4 weeks ago. The problem is unchanged. The maximum temperature recorded prior to her arrival was 101 - 101.9 F. The fever has been present for Less than 1 day. The pain is moderate. Associated symptoms include chills, congestion, headaches and sinus pressure. Pertinent negatives include no coughing, ear pain, hoarse voice, shortness of breath or sore throat. Treatments tried: Zyrtec , floanse. The treatment provided no relief.     Problems:  Patient Active Problem List   Diagnosis Date Noted   Chronic pain of both feet 08/02/2024   Chronic heel pain, left 08/02/2024   Insomnia 08/02/2024   Dysuria 03/04/2024   Diabetic polyneuropathy associated with type 2 diabetes mellitus (HCC) 07/24/2023   Acute URI 07/24/2023   Annual physical exam 04/22/2023   Type 2 diabetes mellitus with diabetic autonomic neuropathy, with long-term current use of insulin  (HCC) 04/22/2023   Seasonal allergic rhinitis 04/22/2023   Tobacco abuse 01/21/2023   Anxiety 01/21/2023   Uncontrolled type 2 diabetes mellitus with hyperglycemia (HCC) 10/22/2022   Tobacco abuse  counseling 10/22/2022   Leg pain, bilateral 10/22/2022   Fibromyalgia 10/22/2022   Chest pain in adult 07/20/2019   IDDM (insulin  dependent diabetes mellitus) 07/08/2018   HTN (hypertension) 07/08/2018   Dyslipidemia, goal LDL below 70 07/08/2018   Altered mental status 03/02/2016   MDD (major depressive disorder), recurrent episode, moderate (HCC) 03/02/2016    Psychoses (HCC)     Allergies:  Allergies  Allergen Reactions   Doxycycline  Hives and Itching   Flagyl [Metronidazole] Hives and Itching    Benadryl     Medications:  Current Outpatient Medications:    amoxicillin -clavulanate (AUGMENTIN ) 875-125 MG tablet, Take 1 tablet by mouth 2 (two) times daily., Disp: 20 tablet, Rfl: 0   fluticasone  (FLONASE ) 50 MCG/ACT nasal spray, Place 2 sprays into both nostrils daily., Disp: 16 g, Rfl: 0   ACCU-CHEK GUIDE TEST test strip, Use as instructed, Disp: 300 strip, Rfl: 2   amitriptyline  (ELAVIL ) 75 MG tablet, TAKE 1 TABLET BY MOUTH AT BEDTIME, Disp: 90 tablet, Rfl: 1   aspirin  EC 81 MG tablet, Take 81 mg by mouth daily., Disp: , Rfl:    cetirizine  (ZYRTEC ) 10 MG tablet, Take 1 tablet (10 mg total) by mouth daily., Disp: 90 tablet, Rfl: 2   Cholecalciferol (VITAMIN D3) 3000 units TABS, Take 3,000 Units by mouth daily as needed (vitamin d )., Disp: , Rfl:    Continuous Glucose Sensor (FREESTYLE LIBRE 3 SENSOR) MISC, APPLY 1 SENSOR ON TO THE SKIN EVERY 14 DAYS. USE TO CHECK GLUCOSE CONTINUOUSLY, Disp: 2 each, Rfl: 1   cyanocobalamin  (VITAMIN B12) 1000 MCG tablet, Take 1,000 mcg by mouth daily., Disp: , Rfl:    diclofenac  (VOLTAREN ) 75 MG EC tablet, Take 1 tablet (75 mg total) by mouth 2 (two) times daily as needed (pain)., Disp: 30 tablet, Rfl: 0   DULoxetine  (CYMBALTA ) 60 MG capsule, Take 1 capsule (60 mg total) by mouth daily., Disp: 90 capsule, Rfl: 2   ezetimibe  (ZETIA ) 10 MG tablet, Take 1 tablet (10 mg total) by mouth daily., Disp: 90 tablet, Rfl: 3   famotidine  (PEPCID ) 20 MG tablet, Take 1 tablet by mouth every morning., Disp: 90 tablet, Rfl: 1   fenofibrate  (TRICOR ) 48 MG tablet, TAKE 1 TABLET BY MOUTH DAILY, Disp: 90 tablet, Rfl: 1   gabapentin  (NEURONTIN ) 600 MG tablet, TAKE 1 AND 1/2 TABLETS BY MOUTH 3 TIMES DAILY, Disp: 270 tablet, Rfl: 3   HYDROcodone -acetaminophen  (NORCO/VICODIN) 5-325 MG tablet, Take 1 tablet by mouth every 6 (six) hours as  needed for moderate pain (pain score 4-6)., Disp: , Rfl:    hydrOXYzine  (ATARAX ) 25 MG tablet, TAKE 1 TABLET BY MOUTH EVERY 8 HOURS AS NEEDED FOR ANXIETY, Disp: 90 tablet, Rfl: 1   Insulin  Syringe-Needle U-100 25G X 1 1 ML MISC, Inject 1 each into the skin daily., Disp: 100 each, Rfl: 3   LANTUS  SOLOSTAR 100 UNIT/ML Solostar Pen, Inject 65 Units into the skin at bedtime., Disp: , Rfl:    LINZESS  145 MCG CAPS capsule, Take 1 capsule (145 mcg total) by mouth every morning., Disp: 90 capsule, Rfl: 1   losartan -hydrochlorothiazide (HYZAAR) 100-12.5 MG tablet, Take 1 tablet by mouth daily., Disp: 90 tablet, Rfl: 1   methocarbamol  (ROBAXIN ) 500 MG tablet, Take 1 tablet (500 mg total) by mouth 4 (four) times daily., Disp: 30 tablet, Rfl: 0   OZEMPIC , 1 MG/DOSE, 4 MG/3ML SOPN, INJECT 1 MG below THE SKIN AS DIRECTED ONCE WEEKLY, Disp: 3 mL, Rfl: 2   rosuvastatin  (CRESTOR ) 40 MG tablet,  Take 1 tablet (40 mg total) by mouth daily., Disp: 90 tablet, Rfl: 1  Observations/Objective: Patient is well-developed, well-nourished in no acute distress.  Resting comfortably at home.  Head is normocephalic, atraumatic.  No labored breathing.  Speech is clear and coherent with logical content.  Patient is alert and oriented at baseline.    Assessment and Plan: 1. Acute bacterial sinusitis (Primary) - amoxicillin -clavulanate (AUGMENTIN ) 875-125 MG tablet; Take 1 tablet by mouth 2 (two) times daily.  Dispense: 20 tablet; Refill: 0 - fluticasone  (FLONASE ) 50 MCG/ACT nasal spray; Place 2 sprays into both nostrils daily.  Dispense: 16 g; Refill: 0  - Worsening symptoms that have not responded to OTC medications.  - Will give Augmentin  - Flonase  refilled - Continue allergy medications.  - Steam and humidifier can help - Stay well hydrated and get plenty of rest.  - Seek in person evaluation if no symptom improvement or if symptoms worsen   Follow Up Instructions: I discussed the assessment and treatment plan  with the patient. The patient was provided an opportunity to ask questions and all were answered. The patient agreed with the plan and demonstrated an understanding of the instructions.  A copy of instructions were sent to the patient via MyChart unless otherwise noted below.    The patient was advised to call back or seek an in-person evaluation if the symptoms worsen or if the condition fails to improve as anticipated.    [name], PA-C

## 2024-08-11 NOTE — Patient Instructions (Signed)
 [name], thank you for joining [name], PA-C for today's virtual visit.  While this provider is not your primary care provider (PCP), if your PCP is located in our provider database this encounter information will be shared with them immediately following your visit.   A [facility] MyChart account gives you access to today's visit and all your visits, tests, and labs performed at [facility]  click here if you don't have a [facility] MyChart account or go to [email or web address]  Consent: (Patient) Ellen Avery provided verbal consent for this virtual visit at the beginning of the encounter.  Current Medications:  Current Outpatient Medications:    amoxicillin -clavulanate (AUGMENTIN ) 875-125 MG tablet, Take 1 tablet by mouth 2 (two) times daily., Disp: 20 tablet, Rfl: 0   fluticasone  (FLONASE ) 50 MCG/ACT nasal spray, Place 2 sprays into both nostrils daily., Disp: 16 g, Rfl: 0   ACCU-CHEK GUIDE TEST test strip, Use as instructed, Disp: 300 strip, Rfl: 2   amitriptyline  (ELAVIL ) 75 MG tablet, TAKE 1 TABLET BY MOUTH AT BEDTIME, Disp: 90 tablet, Rfl: 1   aspirin  EC 81 MG tablet, Take 81 mg by mouth daily., Disp: , Rfl:    cetirizine  (ZYRTEC ) 10 MG tablet, Take 1 tablet (10 mg total) by mouth daily., Disp: 90 tablet, Rfl: 2   Cholecalciferol (VITAMIN D3) 3000 units TABS, Take 3,000 Units by mouth daily as needed (vitamin d )., Disp: , Rfl:    Continuous Glucose Sensor (FREESTYLE LIBRE 3 SENSOR) MISC, APPLY 1 SENSOR ON TO THE SKIN EVERY 14 DAYS. USE TO CHECK GLUCOSE CONTINUOUSLY, Disp: 2 each, Rfl: 1   cyanocobalamin  (VITAMIN B12) 1000 MCG tablet, Take 1,000 mcg by mouth daily., Disp: , Rfl:    diclofenac  (VOLTAREN ) 75 MG EC tablet, Take 1 tablet (75 mg total) by mouth 2 (two) times daily as needed (pain)., Disp: 30 tablet, Rfl: 0   DULoxetine  (CYMBALTA ) 60 MG capsule, Take 1 capsule (60 mg total) by mouth daily., Disp: 90 capsule, Rfl: 2   ezetimibe  (ZETIA )  10 MG tablet, Take 1 tablet (10 mg total) by mouth daily., Disp: 90 tablet, Rfl: 3   famotidine  (PEPCID ) 20 MG tablet, Take 1 tablet by mouth every morning., Disp: 90 tablet, Rfl: 1   fenofibrate  (TRICOR ) 48 MG tablet, TAKE 1 TABLET BY MOUTH DAILY, Disp: 90 tablet, Rfl: 1   gabapentin  (NEURONTIN ) 600 MG tablet, TAKE 1 AND 1/2 TABLETS BY MOUTH 3 TIMES DAILY, Disp: 270 tablet, Rfl: 3   HYDROcodone -acetaminophen  (NORCO/VICODIN) 5-325 MG tablet, Take 1 tablet by mouth every 6 (six) hours as needed for moderate pain (pain score 4-6)., Disp: , Rfl:    hydrOXYzine  (ATARAX ) 25 MG tablet, TAKE 1 TABLET BY MOUTH EVERY 8 HOURS AS NEEDED FOR ANXIETY, Disp: 90 tablet, Rfl: 1   Insulin  Syringe-Needle U-100 25G X 1 1 ML MISC, Inject 1 each into the skin daily., Disp: 100 each, Rfl: 3   LANTUS  SOLOSTAR 100 UNIT/ML Solostar Pen, Inject 65 Units into the skin at bedtime., Disp: , Rfl:    LINZESS  145 MCG CAPS capsule, Take 1 capsule (145 mcg total) by mouth every morning., Disp: 90 capsule, Rfl: 1   losartan -hydrochlorothiazide (HYZAAR) 100-12.5 MG tablet, Take 1 tablet by mouth daily., Disp: 90 tablet, Rfl: 1   methocarbamol  (ROBAXIN ) 500 MG tablet, Take 1 tablet (500 mg total) by mouth 4 (four) times daily., Disp: 30 tablet, Rfl: 0   OZEMPIC , 1 MG/DOSE, 4 MG/3ML SOPN, INJECT 1 MG below THE SKIN  AS DIRECTED ONCE WEEKLY, Disp: 3 mL, Rfl: 2   rosuvastatin  (CRESTOR ) 40 MG tablet, Take 1 tablet (40 mg total) by mouth daily., Disp: 90 tablet, Rfl: 1   Medications ordered in this encounter:  Meds ordered this encounter  Medications   amoxicillin -clavulanate (AUGMENTIN ) 875-125 MG tablet    Sig: Take 1 tablet by mouth 2 (two) times daily.    Dispense:  20 tablet    Refill:  0    Supervising Provider:   LAMPTEY, PHILIP O [id number]   fluticasone  (FLONASE ) 50 MCG/ACT nasal spray    Sig: Place 2 sprays into both nostrils daily.    Dispense:  16 g    Refill:  0    Supervising Provider:   BLAISE ALEENE KIDD [[id number]]      *If you need refills on other medications prior to your next appointment, please contact your pharmacy*  Follow-Up: Call back or seek an in-person evaluation if the symptoms worsen or if the condition fails to improve as anticipated.   Virtual Care [phone]  Other Instructions Sinus Infection, Adult A sinus infection, also called sinusitis, is inflammation of your sinuses. Sinuses are hollow spaces in the bones around your face. Your sinuses are located: Around your eyes. In the middle of your forehead. Behind your nose. In your cheekbones. Mucus normally drains out of your sinuses. When your nasal tissues become inflamed or swollen, mucus can become trapped or blocked. This allows bacteria, viruses, and fungi to grow, which leads to infection. Most infections of the sinuses are caused by a virus. A sinus infection can develop quickly. It can last for up to 4 weeks (acute) or for more than 12 weeks (chronic). A sinus infection often develops after a cold. What are the causes? This condition is caused by anything that creates swelling in the sinuses or stops mucus from draining. This includes: Allergies. Asthma. Infection from bacteria or viruses. Deformities or blockages in your nose or sinuses. Abnormal growths in the nose (nasal polyps). Pollutants, such as chemicals or irritants in the air. Infection from fungi. This is rare. What increases the risk? You are more likely to develop this condition if you: Have a weak body defense system (immune system). Do a lot of swimming or diving. Overuse nasal sprays. Smoke. What are the signs or symptoms? The main symptoms of this condition are pain and a feeling of pressure around the affected sinuses. Other symptoms include: Stuffy nose or congestion that makes it difficult to breathe through your nose. Thick yellow or greenish drainage from your nose. Tenderness, swelling, and warmth over the affected sinuses. A  cough that may get worse at night. Decreased sense of smell and taste. Extra mucus that collects in the throat or the back of the nose (postnasal drip) causing a sore throat or bad breath. Tiredness (fatigue). Fever. How is this diagnosed? This condition is diagnosed based on: Your symptoms. Your medical history. A physical exam. Tests to find out if your condition is acute or chronic. This may include: Checking your nose for nasal polyps. Viewing your sinuses using a device that has a light (endoscope). Testing for allergies or bacteria. Imaging tests, such as an MRI or CT scan. In rare cases, a bone biopsy may be done to rule out more serious types of fungal sinus disease. How is this treated? Treatment for a sinus infection depends on the cause and whether your condition is chronic or acute. If caused by a virus, your symptoms should go  away on their own within 10 days. You may be given medicines to relieve symptoms. They include: Medicines that shrink swollen nasal passages (decongestants). A spray that eases inflammation of the nostrils (topical intranasal corticosteroids). Rinses that help get rid of thick mucus in your nose (nasal saline washes). Medicines that treat allergies (antihistamines). Over-the-counter pain relievers. If caused by bacteria, your health care provider may recommend waiting to see if your symptoms improve. Most bacterial infections will get better without antibiotic medicine. You may be given antibiotics if you have: A severe infection. A weak immune system. If caused by narrow nasal passages or nasal polyps, surgery may be needed. Follow these instructions at home: Medicines Take, use, or apply over-the-counter and prescription medicines only as told by your health care provider. These may include nasal sprays. If you were prescribed an antibiotic medicine, take it as told by your health care provider. Do not stop taking the antibiotic even if you start  to feel better. Hydrate and humidify  Drink enough fluid to keep your urine pale yellow. Staying hydrated will help to thin your mucus. Use a cool mist humidifier to keep the humidity level in your home above 50%. Inhale steam for 10-15 minutes, 3-4 times a day, or as told by your health care provider. You can do this in the bathroom while a hot shower is running. Limit your exposure to cool or dry air. Rest Rest as much as possible. Sleep with your head raised (elevated). Make sure you get enough sleep each night. General instructions  Apply a warm, moist washcloth to your face 3-4 times a day or as told by your health care provider. This will help with discomfort. Use nasal saline washes as often as told by your health care provider. Wash your hands often with soap and water to reduce your exposure to germs. If soap and water are not available, use hand sanitizer. Do not smoke. Avoid being around people who are smoking (secondhand smoke). Keep all follow-up visits. This is important. Contact a health care provider if: You have a fever. Your symptoms get worse. Your symptoms do not improve within 10 days. Get help right away if: You have a severe headache. You have persistent vomiting. You have severe pain or swelling around your face or eyes. You have vision problems. You develop confusion. Your neck is stiff. You have trouble breathing. These symptoms may be an emergency. Get help right away. Call 911. Do not wait to see if the symptoms will go away. Do not drive yourself to the hospital. Summary A sinus infection is soreness and inflammation of your sinuses. Sinuses are hollow spaces in the bones around your face. This condition is caused by nasal tissues that become inflamed or swollen. The swelling traps or blocks the flow of mucus. This allows bacteria, viruses, and fungi to grow, which leads to infection. If you were prescribed an antibiotic medicine, take it as told by  your health care provider. Do not stop taking the antibiotic even if you start to feel better. Keep all follow-up visits. This is important. This information is not intended to replace advice given to you by your health care provider. Make sure you discuss any questions you have with your health care provider. Document Revised: 10/29/2021 Document Reviewed: 10/29/2021 Elsevier Patient Education  2024 [employer]   If you have been instructed to have an in-person evaluation today at a local Urgent Care facility, please use the link below. It will take you to  a list of all of our available [facility], including address, phone number and hours of operation. Please do not delay care.  [facility]  If you or a family member do not have a primary care provider, use the link below to schedule a visit and establish care. When you choose a [facility] primary care physician or advanced practice provider, you gain a long-term partner in health. Find a Primary Care Provider  Learn more about [facility]'s in-office and virtual care options: [facility] - Get Care Now

## 2024-08-11 NOTE — Telephone Encounter (Signed)
 Pharmacy Patient Advocate Encounter  Received notification from [facility] MEDICAID that Prior Authorization for FREESTYLE LIBRE 3 SENSOR has been APPROVED from 08/10/2024 to 08/10/2025   PA #/Case ID/Reference #: [id number]

## 2024-08-16 ENCOUNTER — Telehealth: Payer: Self-pay

## 2024-08-16 NOTE — Telephone Encounter (Signed)
 Sent to [employer]

## 2024-08-19 ENCOUNTER — Ambulatory Visit (INDEPENDENT_AMBULATORY_CARE_PROVIDER_SITE_OTHER): Admitting: Podiatry

## 2024-08-19 ENCOUNTER — Ambulatory Visit: Admitting: Podiatry

## 2024-08-19 DIAGNOSIS — M722 Plantar fascial fibromatosis: Secondary | ICD-10-CM | POA: Diagnosis not present

## 2024-08-19 DIAGNOSIS — M216X2 Other acquired deformities of left foot: Secondary | ICD-10-CM | POA: Diagnosis not present

## 2024-08-19 DIAGNOSIS — M216X1 Other acquired deformities of right foot: Secondary | ICD-10-CM | POA: Diagnosis not present

## 2024-08-19 NOTE — Progress Notes (Signed)
 Subjective:  Patient ID: Ellen Avery, female    DOB: Apr 12, 1966,  MRN: [id number]  Chief Complaint  Patient presents with   Numbness    58 y.o. female presents with the above complaint.  Patient presents for right heel pain that has been going on for quite some time.  Patient states it hurts with ambulation or shoe pressure has not seen MRIs prior to seeing me.  She does have a history of some neuropathy as well.  She is a diabetic but she has a lot of tenderness in the heel pain scale 7 out of 10 dull aching nature worse with taking for step in the morning.  Has not been wearing any orthotics.   Review of Systems: Negative except as noted in the HPI. Denies N/V/F/Ch.  Past Medical History:  Diagnosis Date   Allergy At 58 years old   Anxiety    Arthritis    Chronic headaches    DDD (degenerative disc disease), cervical    Fibromyalgia    GERD (gastroesophageal reflux disease)    History of colon polyps    History of psychosis    Hypercholesteremia    Hypertension    IBS (irritable bowel syndrome)    IBS (irritable bowel syndrome)    MDD (major depressive disorder)    Right sided facial pain    Seasonal allergic rhinitis    Sinus congestion    12-24-2018 per pt no fever   Type 2 diabetes mellitus treated with insulin  (HCC)    Vitamin D  deficiency    Wears dentures    full upper,  partial lower   Wears glasses     Current Outpatient Medications:    ACCU-CHEK GUIDE TEST test strip, Use as instructed, Disp: 300 strip, Rfl: 2   amitriptyline  (ELAVIL ) 75 MG tablet, TAKE 1 TABLET BY MOUTH AT BEDTIME, Disp: 90 tablet, Rfl: 1   aspirin  EC 81 MG tablet, Take 81 mg by mouth daily., Disp: , Rfl:    cetirizine  (ZYRTEC ) 10 MG tablet, Take 1 tablet (10 mg total) by mouth daily., Disp: 90 tablet, Rfl: 2   Cholecalciferol (VITAMIN D3) 3000 units TABS, Take 3,000 Units by mouth daily as needed (vitamin d )., Disp: , Rfl:    cyanocobalamin  (VITAMIN B12) 1000 MCG tablet, Take 1,000 mcg  by mouth daily., Disp: , Rfl:    DULoxetine  (CYMBALTA ) 60 MG capsule, Take 1 capsule (60 mg total) by mouth daily., Disp: 90 capsule, Rfl: 2   ezetimibe  (ZETIA ) 10 MG tablet, Take 1 tablet (10 mg total) by mouth daily., Disp: 90 tablet, Rfl: 3   famotidine  (PEPCID ) 20 MG tablet, Take 1 tablet by mouth every morning., Disp: 90 tablet, Rfl: 1   fenofibrate  (TRICOR ) 48 MG tablet, TAKE 1 TABLET BY MOUTH DAILY, Disp: 90 tablet, Rfl: 1   fluconazole  (DIFLUCAN ) 150 MG tablet, Take 1 tablet PO once. Repeat in 3 days if needed., Disp: 2 tablet, Rfl: 0   fluticasone  (FLONASE ) 50 MCG/ACT nasal spray, Place 2 sprays into both nostrils daily., Disp: 16 g, Rfl: 0   gabapentin  (NEURONTIN ) 600 MG tablet, TAKE 1 AND 1/2 TABLETS BY MOUTH 3 TIMES DAILY, Disp: 270 tablet, Rfl: 3   HYDROcodone -acetaminophen  (NORCO/VICODIN) 5-325 MG tablet, Take 1 tablet by mouth every 6 (six) hours as needed for moderate pain (pain score 4-6)., Disp: , Rfl:    hydrOXYzine  (ATARAX ) 25 MG tablet, TAKE 1 TABLET BY MOUTH EVERY 8 HOURS AS NEEDED FOR ANXIETY, Disp: 90 tablet, Rfl: 1  LANTUS  SOLOSTAR 100 UNIT/ML Solostar Pen, Inject 65 Units into the skin at bedtime., Disp: , Rfl:    LINZESS  145 MCG CAPS capsule, Take 1 capsule (145 mcg total) by mouth every morning., Disp: 90 capsule, Rfl: 1   losartan -hydrochlorothiazide (HYZAAR) 100-12.5 MG tablet, Take 1 tablet by mouth daily., Disp: 90 tablet, Rfl: 1   methocarbamol  (ROBAXIN ) 500 MG tablet, Take 1 tablet (500 mg total) by mouth 4 (four) times daily., Disp: 30 tablet, Rfl: 0   OZEMPIC , 1 MG/DOSE, 4 MG/3ML SOPN, INJECT 1 MG below THE SKIN AS DIRECTED ONCE WEEKLY, Disp: 3 mL, Rfl: 2   predniSONE  (DELTASONE ) 20 MG tablet, Take 2 tablets (40 mg total) by mouth daily with breakfast., Disp: 10 tablet, Rfl: 0   rosuvastatin  (CRESTOR ) 40 MG tablet, Take 1 tablet (40 mg total) by mouth daily., Disp: 90 tablet, Rfl: 1  Social History   Tobacco Use  Smoking Status Every Day   Current packs/day:  1.00   Average packs/day: 1 pack/day for 35.0 years (35.0 ttl pk-yrs)   Types: Cigarettes  Smokeless Tobacco Never    Allergies  Allergen Reactions   Doxycycline  Hives and Itching   Flagyl [Metronidazole] Hives and Itching    Benadryl     Objective:  There were no vitals filed for this visit. There is no height or weight on file to calculate BMI. Constitutional Well developed. Well nourished.  Vascular Dorsalis pedis pulses palpable bilaterally. Posterior tibial pulses palpable bilaterally. Capillary refill normal to all digits.  No cyanosis or clubbing noted. Pedal hair growth normal.  Neurologic Normal speech. Oriented to person, place, and time. Epicritic sensation to light touch grossly present bilaterally.  Dermatologic Nails well groomed and normal in appearance. No open wounds. No skin lesions.  Orthopedic: Normal joint ROM without pain or crepitus bilaterally. No visible deformities. Tender to palpation at the calcaneal tuber right. No pain with calcaneal squeeze right. Ankle ROM diminished range of motion right. Silfverskiold Test: positive right.   Radiographs: None  Assessment:  No diagnosis found. Plan:  Patient was evaluated and treated and all questions answered.  Plantar Fasciitis, right - XR reviewed as above.  - Educated on icing and stretching. Instructions given.  - Injection delivered to the plantar fascia as below. - DME: Plantar fascial brace dispensed to support the medial longitudinal arch of the foot and offload pressure from the heel and prevent arch collapse during weightbearing - Pharmacologic management: None  Pes planovalgus -I explained to patient the etiology of pes planovalgus and relationship with Planter fasciitis and various treatment options were discussed.  Given patient foot structure in the setting of Planter fasciitis I believe patient will benefit from custom-made orthotics to help control the hindfoot motion support the arch  of the foot and take the stress away from plantar fascial.  Patient agrees with the plan like to proceed with orthotics -Patient was casted for orthotics   Procedure: Injection Tendon/Ligament Location: Right plantar fascia at the glabrous junction; medial approach. Skin Prep: alcohol Injectate: 0.5 cc 0.5% marcaine  plain, 0.5 cc of 1% Lidocaine , 0.5 cc kenalog  10. Disposition: Patient tolerated procedure well. Injection site dressed with a band-aid.  No follow-ups on file.

## 2024-08-22 DIAGNOSIS — I10 Essential (primary) hypertension: Secondary | ICD-10-CM | POA: Diagnosis not present

## 2024-08-22 DIAGNOSIS — G894 Chronic pain syndrome: Secondary | ICD-10-CM | POA: Diagnosis not present

## 2024-08-22 DIAGNOSIS — M79604 Pain in right leg: Secondary | ICD-10-CM | POA: Diagnosis not present

## 2024-08-22 DIAGNOSIS — E1142 Type 2 diabetes mellitus with diabetic polyneuropathy: Secondary | ICD-10-CM | POA: Diagnosis not present

## 2024-08-22 DIAGNOSIS — Z79899 Other long term (current) drug therapy: Secondary | ICD-10-CM | POA: Diagnosis not present

## 2024-08-22 DIAGNOSIS — Z6834 Body mass index (BMI) 34.0-34.9, adult: Secondary | ICD-10-CM | POA: Diagnosis not present

## 2024-08-22 DIAGNOSIS — M79605 Pain in left leg: Secondary | ICD-10-CM | POA: Diagnosis not present

## 2024-08-22 DIAGNOSIS — E6609 Other obesity due to excess calories: Secondary | ICD-10-CM | POA: Diagnosis not present

## 2024-08-22 DIAGNOSIS — Z780 Asymptomatic menopausal state: Secondary | ICD-10-CM | POA: Diagnosis not present

## 2024-08-24 DIAGNOSIS — Z79899 Other long term (current) drug therapy: Secondary | ICD-10-CM | POA: Diagnosis not present

## 2024-08-29 ENCOUNTER — Telehealth: Admitting: Physician Assistant

## 2024-08-29 DIAGNOSIS — J302 Other seasonal allergic rhinitis: Secondary | ICD-10-CM

## 2024-08-29 DIAGNOSIS — T3695XA Adverse effect of unspecified systemic antibiotic, initial encounter: Secondary | ICD-10-CM

## 2024-08-29 DIAGNOSIS — B379 Candidiasis, unspecified: Secondary | ICD-10-CM

## 2024-08-29 DIAGNOSIS — J0111 Acute recurrent frontal sinusitis: Secondary | ICD-10-CM | POA: Diagnosis not present

## 2024-08-29 MED ORDER — PREDNISONE 20 MG PO TABS: 40.0000 mg | ORAL_TABLET | Freq: Every day | ORAL | 0 refills | Status: DC

## 2024-08-29 MED ORDER — FLUCONAZOLE 150 MG PO TABS: ORAL_TABLET | ORAL | 0 refills | Status: DC

## 2024-08-29 NOTE — Patient Instructions (Signed)
 Ellen Avery, thank you for joining [name], PA-C for today's virtual visit.  While this provider is not your primary care provider (PCP), if your PCP is located in our provider database this encounter information will be shared with them immediately following your visit.   A [facility] MyChart account gives you access to today's visit and all your visits, tests, and labs performed at [facility]  click here if you don't have a [facility] MyChart account or go to [email or web address]  Consent: (Patient) Ellen Avery provided verbal consent for this virtual visit at the beginning of the encounter.  Current Medications:  Current Outpatient Medications:    fluconazole  (DIFLUCAN ) 150 MG tablet, Take 1 tablet PO once. Repeat in 3 days if needed., Disp: 2 tablet, Rfl: 0   predniSONE  (DELTASONE ) 20 MG tablet, Take 2 tablets (40 mg total) by mouth daily with breakfast., Disp: 10 tablet, Rfl: 0   ACCU-CHEK GUIDE TEST test strip, Use as instructed, Disp: 300 strip, Rfl: 2   amitriptyline  (ELAVIL ) 75 MG tablet, TAKE 1 TABLET BY MOUTH AT BEDTIME, Disp: 90 tablet, Rfl: 1   aspirin  EC 81 MG tablet, Take 81 mg by mouth daily., Disp: , Rfl:    cetirizine  (ZYRTEC ) 10 MG tablet, Take 1 tablet (10 mg total) by mouth daily., Disp: 90 tablet, Rfl: 2   Cholecalciferol (VITAMIN D3) 3000 units TABS, Take 3,000 Units by mouth daily as needed (vitamin d )., Disp: , Rfl:    cyanocobalamin  (VITAMIN B12) 1000 MCG tablet, Take 1,000 mcg by mouth daily., Disp: , Rfl:    DULoxetine  (CYMBALTA ) 60 MG capsule, Take 1 capsule (60 mg total) by mouth daily., Disp: 90 capsule, Rfl: 2   ezetimibe  (ZETIA ) 10 MG tablet, Take 1 tablet (10 mg total) by mouth daily., Disp: 90 tablet, Rfl: 3   famotidine  (PEPCID ) 20 MG tablet, Take 1 tablet by mouth every morning., Disp: 90 tablet, Rfl: 1   fenofibrate  (TRICOR ) 48 MG tablet, TAKE 1 TABLET BY MOUTH DAILY, Disp: 90 tablet, Rfl: 1   fluticasone  (FLONASE )  50 MCG/ACT nasal spray, Place 2 sprays into both nostrils daily., Disp: 16 g, Rfl: 0   gabapentin  (NEURONTIN ) 600 MG tablet, TAKE 1 AND 1/2 TABLETS BY MOUTH 3 TIMES DAILY, Disp: 270 tablet, Rfl: 3   HYDROcodone -acetaminophen  (NORCO/VICODIN) 5-325 MG tablet, Take 1 tablet by mouth every 6 (six) hours as needed for moderate pain (pain score 4-6)., Disp: , Rfl:    hydrOXYzine  (ATARAX ) 25 MG tablet, TAKE 1 TABLET BY MOUTH EVERY 8 HOURS AS NEEDED FOR ANXIETY, Disp: 90 tablet, Rfl: 1   LANTUS  SOLOSTAR 100 UNIT/ML Solostar Pen, Inject 65 Units into the skin at bedtime., Disp: , Rfl:    LINZESS  145 MCG CAPS capsule, Take 1 capsule (145 mcg total) by mouth every morning., Disp: 90 capsule, Rfl: 1   losartan -hydrochlorothiazide (HYZAAR) 100-12.5 MG tablet, Take 1 tablet by mouth daily., Disp: 90 tablet, Rfl: 1   methocarbamol  (ROBAXIN ) 500 MG tablet, Take 1 tablet (500 mg total) by mouth 4 (four) times daily., Disp: 30 tablet, Rfl: 0   OZEMPIC , 1 MG/DOSE, 4 MG/3ML SOPN, INJECT 1 MG below THE SKIN AS DIRECTED ONCE WEEKLY, Disp: 3 mL, Rfl: 2   rosuvastatin  (CRESTOR ) 40 MG tablet, Take 1 tablet (40 mg total) by mouth daily., Disp: 90 tablet, Rfl: 1   Medications ordered in this encounter:  Meds ordered this encounter  Medications   predniSONE  (DELTASONE ) 20 MG tablet    Sig: Take  2 tablets (40 mg total) by mouth daily with breakfast.    Dispense:  10 tablet    Refill:  0    Supervising Provider:   LAMPTEY, PHILIP O [[id number]]   fluconazole  (DIFLUCAN ) 150 MG tablet    Sig: Take 1 tablet PO once. Repeat in 3 days if needed.    Dispense:  2 tablet    Refill:  0    Supervising Provider:   LAMPTEY, PHILIP O [[id number]]     *If you need refills on other medications prior to your next appointment, please contact your pharmacy*  Follow-Up: Call back or seek an in-person evaluation if the symptoms worsen or if the condition fails to improve as anticipated.  [facility] [phone]  Other  Instructions Please hydrate and rest. Ok to continue your OTC medications. Stop the Zyrtec  for now and switch to OTC Xyzal  or Allegra . Continue Flonase  nasal spray. Take the prescribed medications as directed. If you note any non-resolving, new, or worsening symptoms despite treatment, please seek an in-person evaluation ASAP.    If you have been instructed to have an in-person evaluation today at a local Urgent Care facility, please use the link below. It will take you to a list of all of our available [facility], including address, phone number and hours of operation. Please do not delay care.  [facility]  If you or a family member do not have a primary care provider, use the link below to schedule a visit and establish care. When you choose a [facility] primary care physician or advanced practice provider, you gain a long-term partner in health. Find a Primary Care Provider  Learn more about [name] in-office and virtual care options: [facility] - Get Care Now

## 2024-08-29 NOTE — Progress Notes (Signed)
 Virtual Visit Consent   Ellen Avery, you are scheduled for a virtual visit with a [facility] provider today. Just as with appointments in the office, your consent must be obtained to participate. Your consent will be active for this visit and any virtual visit you may have with one of our providers in the next 365 days. If you have a MyChart account, a copy of this consent can be sent to you electronically.  As this is a virtual visit, video technology does not allow for your provider to perform a traditional examination. This may limit your provider's ability to fully assess your condition. If your provider identifies any concerns that need to be evaluated in person or the need to arrange testing (such as labs, EKG, etc.), we will make arrangements to do so. Although advances in technology are sophisticated, we cannot ensure that it will always work on either your end or our end. If the connection with a video visit is poor, the visit may have to be switched to a telephone visit. With either a video or telephone visit, we are not always able to ensure that we have a secure connection.  By engaging in this virtual visit, you consent to the provision of healthcare and authorize for your insurance to be billed (if applicable) for the services provided during this visit. Depending on your insurance coverage, you may receive a charge related to this service.  I need to obtain your verbal consent now. Are you willing to proceed with your visit today? Ellen Avery has provided verbal consent on 08/29/2024 for a virtual visit (video or telephone). Ellen Avery, [place]  Date: 08/29/2024 7:55 AM   Virtual Visit via Video Note   I, Ellen Avery, connected with  CAILA CIRELLI  ([id number], 1966/02/15) on 08/29/24 at  7:45 AM EDT by a video-enabled telemedicine application and verified that I am speaking with the correct person using two identifiers.  Location: Patient: Virtual Visit Location  Patient: Home Provider: Virtual Visit Location Provider: Home Office   I discussed the limitations of evaluation and management by telemedicine and the availability of in person appointments. The patient expressed understanding and agreed to proceed.    History of Present Illness: Ellen Avery is a 58 y.o. who identifies as a female who was assigned female at birth, and is being seen today for some persistent nasal congestion and sinus pressure with headache after recent treatment for bacterial sinusitis with course of Augmentin . Noted improvement and resolution of symptoms with antibiotic but over past day noting nasal congestion, sinus pressure with sinus headache. Denies sinus pain/facial pain or fever. Denies chest congestion or cough. Is taking her Zyrtec  (chronic, daily) and Flonase  (PRN). Is noting substantial vaginal itching and discharge from antibiotic use.  HPI: HPI  Problems:  Patient Active Problem List   Diagnosis Date Noted   Chronic pain of both feet 08/02/2024   Chronic heel pain, left 08/02/2024   Insomnia 08/02/2024   Dysuria 03/04/2024   Diabetic polyneuropathy associated with type 2 diabetes mellitus (HCC) 07/24/2023   Acute URI 07/24/2023   Annual physical exam 04/22/2023   Type 2 diabetes mellitus with diabetic autonomic neuropathy, with long-term current use of insulin  (HCC) 04/22/2023   Seasonal allergic rhinitis 04/22/2023   Tobacco abuse 01/21/2023   Anxiety 01/21/2023   Uncontrolled type 2 diabetes mellitus with hyperglycemia (HCC) 10/22/2022   Tobacco abuse counseling 10/22/2022   Leg pain, bilateral 10/22/2022   Fibromyalgia 10/22/2022  Chest pain in adult 07/20/2019   IDDM (insulin  dependent diabetes mellitus) 07/08/2018   HTN (hypertension) 07/08/2018   Dyslipidemia, goal LDL below 70 07/08/2018   Altered mental status 03/02/2016   MDD (major depressive disorder), recurrent episode, moderate (HCC) 03/02/2016   Psychoses (HCC)     Allergies:   Allergies  Allergen Reactions   Doxycycline  Hives and Itching   Flagyl [Metronidazole] Hives and Itching    Benadryl     Medications:  Current Outpatient Medications:    fluconazole  (DIFLUCAN ) 150 MG tablet, Take 1 tablet PO once. Repeat in 3 days if needed., Disp: 2 tablet, Rfl: 0   predniSONE  (DELTASONE ) 20 MG tablet, Take 2 tablets (40 mg total) by mouth daily with breakfast., Disp: 10 tablet, Rfl: 0   ACCU-CHEK GUIDE TEST test strip, Use as instructed, Disp: 300 strip, Rfl: 2   amitriptyline  (ELAVIL ) 75 MG tablet, TAKE 1 TABLET BY MOUTH AT BEDTIME, Disp: 90 tablet, Rfl: 1   aspirin  EC 81 MG tablet, Take 81 mg by mouth daily., Disp: , Rfl:    cetirizine  (ZYRTEC ) 10 MG tablet, Take 1 tablet (10 mg total) by mouth daily., Disp: 90 tablet, Rfl: 2   Cholecalciferol (VITAMIN D3) 3000 units TABS, Take 3,000 Units by mouth daily as needed (vitamin d )., Disp: , Rfl:    cyanocobalamin  (VITAMIN B12) 1000 MCG tablet, Take 1,000 mcg by mouth daily., Disp: , Rfl:    DULoxetine  (CYMBALTA ) 60 MG capsule, Take 1 capsule (60 mg total) by mouth daily., Disp: 90 capsule, Rfl: 2   ezetimibe  (ZETIA ) 10 MG tablet, Take 1 tablet (10 mg total) by mouth daily., Disp: 90 tablet, Rfl: 3   famotidine  (PEPCID ) 20 MG tablet, Take 1 tablet by mouth every morning., Disp: 90 tablet, Rfl: 1   fenofibrate  (TRICOR ) 48 MG tablet, TAKE 1 TABLET BY MOUTH DAILY, Disp: 90 tablet, Rfl: 1   fluticasone  (FLONASE ) 50 MCG/ACT nasal spray, Place 2 sprays into both nostrils daily., Disp: 16 g, Rfl: 0   gabapentin  (NEURONTIN ) 600 MG tablet, TAKE 1 AND 1/2 TABLETS BY MOUTH 3 TIMES DAILY, Disp: 270 tablet, Rfl: 3   HYDROcodone -acetaminophen  (NORCO/VICODIN) 5-325 MG tablet, Take 1 tablet by mouth every 6 (six) hours as needed for moderate pain (pain score 4-6)., Disp: , Rfl:    hydrOXYzine  (ATARAX ) 25 MG tablet, TAKE 1 TABLET BY MOUTH EVERY 8 HOURS AS NEEDED FOR ANXIETY, Disp: 90 tablet, Rfl: 1   LANTUS  SOLOSTAR 100 UNIT/ML Solostar Pen,  Inject 65 Units into the skin at bedtime., Disp: , Rfl:    LINZESS  145 MCG CAPS capsule, Take 1 capsule (145 mcg total) by mouth every morning., Disp: 90 capsule, Rfl: 1   losartan -hydrochlorothiazide (HYZAAR) 100-12.5 MG tablet, Take 1 tablet by mouth daily., Disp: 90 tablet, Rfl: 1   methocarbamol  (ROBAXIN ) 500 MG tablet, Take 1 tablet (500 mg total) by mouth 4 (four) times daily., Disp: 30 tablet, Rfl: 0   OZEMPIC , 1 MG/DOSE, 4 MG/3ML SOPN, INJECT 1 MG below THE SKIN AS DIRECTED ONCE WEEKLY, Disp: 3 mL, Rfl: 2   rosuvastatin  (CRESTOR ) 40 MG tablet, Take 1 tablet (40 mg total) by mouth daily., Disp: 90 tablet, Rfl: 1  Observations/Objective: Patient is well-developed, well-nourished in no acute distress.  Resting comfortably  at home.  Head is normocephalic, atraumatic.  No labored breathing.  Speech is clear and coherent with logical content.  Patient is alert and oriented at baseline.   Assessment and Plan: 1. Seasonal allergies (Primary) - predniSONE  (DELTASONE ) 20 MG  tablet; Take 2 tablets (40 mg total) by mouth daily with breakfast.  Dispense: 10 tablet; Refill: 0 2. Acute recurrent frontal sinusitis - predniSONE  (DELTASONE ) 20 MG tablet; Take 2 tablets (40 mg total) by mouth daily with breakfast.  Dispense: 10 tablet; Refill: 0  Suspect recurring sinus inflammation secondary to uncontrolled allergies. Stop Zyrtec  and switch to trial of OTC Xyzal  or Allegra . Ok to continue Flonase  and OTC sinus medication for now. Will give short course of prednisone  to reduce sinus inflammation. PCP follow-up discussed.  3. Antibiotic-induced yeast infection - fluconazole  (DIFLUCAN ) 150 MG tablet; Take 1 tablet PO once. Repeat in 3 days if needed.  Dispense: 2 tablet; Refill: 0  Diflucan  x 2 given. Follow-up in person for any non-resolving, new or worsening symptoms despite treatment.   Follow Up Instructions: I discussed the assessment and treatment plan with the patient. The patient was  provided an opportunity to ask questions and all were answered. The patient agreed with the plan and demonstrated an understanding of the instructions.  A copy of instructions were sent to the patient via MyChart unless otherwise noted below.    The patient was advised to call back or seek an in-person evaluation if the symptoms worsen or if the condition fails to improve as anticipated.    Ellen Velma Lunger, PA-C

## 2024-08-30 ENCOUNTER — Other Ambulatory Visit: Payer: Self-pay | Admitting: Nurse Practitioner

## 2024-08-30 DIAGNOSIS — J302 Other seasonal allergic rhinitis: Secondary | ICD-10-CM

## 2024-08-30 MED ORDER — FEXOFENADINE HCL 180 MG PO TABS: 180.0000 mg | ORAL_TABLET | Freq: Every day | ORAL | 3 refills | Status: AC

## 2024-09-02 ENCOUNTER — Ambulatory Visit

## 2024-09-05 ENCOUNTER — Ambulatory Visit (INDEPENDENT_AMBULATORY_CARE_PROVIDER_SITE_OTHER): Admitting: Nurse Practitioner

## 2024-09-05 ENCOUNTER — Encounter: Payer: Self-pay | Admitting: Nurse Practitioner

## 2024-09-05 VITALS — BP 128/69 | HR 74 | Wt 217.0 lb

## 2024-09-05 DIAGNOSIS — G894 Chronic pain syndrome: Secondary | ICD-10-CM | POA: Diagnosis not present

## 2024-09-05 DIAGNOSIS — E1143 Type 2 diabetes mellitus with diabetic autonomic (poly)neuropathy: Secondary | ICD-10-CM

## 2024-09-05 DIAGNOSIS — I10 Essential (primary) hypertension: Secondary | ICD-10-CM

## 2024-09-05 DIAGNOSIS — E785 Hyperlipidemia, unspecified: Secondary | ICD-10-CM | POA: Diagnosis not present

## 2024-09-05 DIAGNOSIS — F17210 Nicotine dependence, cigarettes, uncomplicated: Secondary | ICD-10-CM | POA: Diagnosis not present

## 2024-09-05 DIAGNOSIS — F32A Depression, unspecified: Secondary | ICD-10-CM

## 2024-09-05 DIAGNOSIS — M792 Neuralgia and neuritis, unspecified: Secondary | ICD-10-CM | POA: Insufficient documentation

## 2024-09-05 DIAGNOSIS — Z794 Long term (current) use of insulin: Secondary | ICD-10-CM

## 2024-09-05 DIAGNOSIS — N182 Chronic kidney disease, stage 2 (mild): Secondary | ICD-10-CM

## 2024-09-05 DIAGNOSIS — Z720 Tobacco use: Secondary | ICD-10-CM

## 2024-09-05 DIAGNOSIS — F419 Anxiety disorder, unspecified: Secondary | ICD-10-CM | POA: Insufficient documentation

## 2024-09-05 DIAGNOSIS — G4700 Insomnia, unspecified: Secondary | ICD-10-CM

## 2024-09-05 DIAGNOSIS — G8929 Other chronic pain: Secondary | ICD-10-CM | POA: Insufficient documentation

## 2024-09-05 LAB — POCT GLYCOSYLATED HEMOGLOBIN (HGB A1C): Hemoglobin A1C: 7.1 % — AB (ref 4.0–5.6)

## 2024-09-05 MED ORDER — OZEMPIC (2 MG/DOSE) 8 MG/3ML ~~LOC~~ SOPN: 2.0000 mg | PEN_INJECTOR | SUBCUTANEOUS | 0 refills | Status: DC

## 2024-09-05 MED ORDER — LANTUS SOLOSTAR 100 UNIT/ML ~~LOC~~ SOPN: 52.0000 [IU] | PEN_INJECTOR | Freq: Every day | SUBCUTANEOUS | Status: DC

## 2024-09-05 NOTE — Assessment & Plan Note (Signed)
 Hyperlipidemia LDL at 38 mg/dL, well-controlled with current medications. - Continue Zetia  10 mg daily, fenofibrate  48 mg daily, and rosuvastatin  40 mg daily.

## 2024-09-05 NOTE — Assessment & Plan Note (Signed)
    09/05/2024    1:46 PM 08/02/2024   10:44 AM 08/14/2023   10:30 AM  Depression screen PHQ 2/9  Decreased Interest 2 1 3   Down, Depressed, Hopeless 2 1 0  PHQ - 2 Score 4 2 3   Altered sleeping 3 3 1   Tired, decreased energy 3 3 1   Change in appetite 3 3 3   Feeling bad or failure about yourself  0 0 0  Trouble concentrating 0 0 0  Moving slowly or fidgety/restless 1 3 2   Suicidal thoughts 0 0 0  PHQ-9 Score 14 14 10   Difficult doing work/chores Somewhat difficult Not difficult at all Very difficult       09/05/2024    1:47 PM 08/02/2024   10:49 AM 01/23/2017    9:40 AM  GAD 7 : Generalized Anxiety Score  Nervous, Anxious, on Edge 3 1 2   Control/stop worrying 1 1 2   Worry too much - different things 3 1 1   Trouble relaxing 1 1 2   Restless 3 3 0  Easily annoyed or irritable 3 1 2   Afraid - awful might happen 0 0 0  Total GAD 7 Score 14 8 9   Anxiety Difficulty Somewhat difficult Not difficult at all Not difficult at all   Possibly exacerbated by chronic pain. Relies on faith for coping. - Continue duloxetine  60 mg daily, amitriptyline  75 mg at bedtime and hydroxyzine  25 mg 3 times daily as needed as needed.

## 2024-09-05 NOTE — Assessment & Plan Note (Signed)
 BP Readings from Last 3 Encounters:  09/05/24 128/69  08/02/24 135/88  07/08/24 119/83   Hypertension Well-controlled with Hyzaar. BP 128/69 mmHg, continue losartan -hydrochlorothiazide 100-12.5 mg 1 tablet daily Losartan  provides kidney protection.

## 2024-09-05 NOTE — Progress Notes (Signed)
 Established Patient Office Visit  Subjective:  Patient ID: Ellen Avery, female    DOB: [birth year]  Age: 58 y.o. MRN: [id number]  CC:  Chief Complaint  Patient presents with   Diabetes    HPI    Discussed the use of AI scribe software for clinical note transcription with the patient, who gave verbal consent to proceed.  History of Present Illness Ellen Avery is a 58 year old female  has a past medical history of Allergy (At 58 years old), Anxiety, Arthritis, Chronic headaches, DDD (degenerative disc disease), cervical, Fibromyalgia, GERD (gastroesophageal reflux disease), History of colon polyps, History of psychosis, Hypercholesteremia, Hypertension, IBS (irritable bowel syndrome), IBS (irritable bowel syndrome), MDD (major depressive disorder), Right sided facial pain, Seasonal allergic rhinitis, Sinus congestion, Type 2 diabetes mellitus treated with insulin  (HCC), Vitamin D  deficiency, Wears dentures, and Wears glasses. who presents for a routine follow-up.  Her recent blood pressure was 128/69 mmHg, and her A1c was 7.1, previously 7.7.  She is on multiple medications for her conditions. For neuropathy, she takes amitriptyline  75 mg and gabapentin  300mg  tablets three times daily, along with duloxetine  60 mg dailyFor pain, she takes Vicodin 5/325 mg every 6 hours as needed.. She experiences persistent burning and tingling pain in her feet despite previous treatments, including prednisone  and a steroid injection. She is currently taking prednisone  40 mg for five days for foot pain.  For cholesterol, she takes Zetia  10 mg, fenofibrate  48 mg, and Crestor  daily.   For acid reflux, she takes famotidine  20 mg every morning.   She uses Flonase  as needed for allergies and recently started Allegra .  For anxiety, she takes hydroxyzine  25 mg three times daily as needed.  She uses Lantus  insulin , typically 60-65 units at night, occasionally increasing to 100 units if her blood sugar is  elevated. She also takes Ozempic  1 mg weekly. She has experienced issues with the Lantus  pen getting stuck, preventing full dosing.  She has a history of anxiety and depression, attributed to chronic pain, managed with medication and personal faith, stating 'My therapist name is Jesus.'  She smokes one pack of cigarettes per day with no plans to quit. She has a history of a benign lung nodule and has not had a lung cancer screening since 2021.  Not interested in lung cancer screening   Recently had labs done at [facility], eGFR of 62. She has some protein in her urine. Ldl well controlled     Assessment & Plan     Past Medical History:  Diagnosis Date   Allergy At 58 years old   Anxiety    Arthritis    Chronic headaches    DDD (degenerative disc disease), cervical    Fibromyalgia    GERD (gastroesophageal reflux disease)    History of colon polyps    History of psychosis    Hypercholesteremia    Hypertension    IBS (irritable bowel syndrome)    IBS (irritable bowel syndrome)    MDD (major depressive disorder)    Right sided facial pain    Seasonal allergic rhinitis    Sinus congestion    12-24-2018 per pt no fever   Type 2 diabetes mellitus treated with insulin  (HCC)    Vitamin D  deficiency    Wears dentures    full upper,  partial lower   Wears glasses     Past Surgical History:  Procedure Laterality Date   ABDOMINAL HYSTERECTOMY     ARTERY  BIOPSY Right 12/28/2018   Procedure: RIGHT BIOPSY TEMPORAL ARTERY;  Surgeon: Kinsinger, Herlene Righter, MD;  Location: [facility];  Service: General;  Laterality: Right;   BRAIN SURGERY     CARDIAC CATHETERIZATION     MULTIPLE TOOTH EXTRACTIONS  04/ 2019    sedation   TOTAL ABDOMINAL HYSTERECTOMY W/ BILATERAL SALPINGOOPHORECTOMY  2009   TUBAL LIGATION      Family History  Problem Relation Age of Onset   Diabetes Mother    Heart disease Mother    High blood pressure Mother    Hyperlipidemia Mother     Anxiety disorder Mother    Arthritis Mother    Depression Mother    Varicose Veins Mother    Diabetes Mellitus II Father    Alcohol abuse Father    Diabetes Father    Heart Problems Brother    Drug abuse Brother    Breast cancer Maternal Aunt    Cervical cancer Paternal Aunt    Diabetes Paternal Uncle    Heart disease Paternal Grandmother    Breast cancer Paternal Grandmother    Head & neck cancer Cousin    Stroke Maternal Grandmother    Vision loss Maternal Grandmother    ADD / ADHD Son    Cancer Maternal Aunt    Colon cancer Neg Hx    Colon polyps Neg Hx    Esophageal cancer Neg Hx    Rectal cancer Neg Hx    Stomach cancer Neg Hx     Social History   Socioeconomic History   Marital status: Divorced    Spouse name: Not on file   Number of children: 2   Years of education: Not on file   Highest education level: 9th grade  Occupational History   Not on file  Tobacco Use   Smoking status: Every Day    Current packs/day: 1.00    Average packs/day: 1 pack/day for 35.0 years (35.0 ttl pk-yrs)    Types: Cigarettes   Smokeless tobacco: Never  Vaping Use   Vaping status: Never Used  Substance and Sexual Activity   Alcohol use: No   Drug use: No   Sexual activity: Not Currently    Birth control/protection: Surgical  Other Topics Concern   Not on file  Social History Narrative   Lives home alone    Social Drivers of Health   Financial Resource Strain: High Risk (07/02/2024)   Overall Financial Resource Strain (CARDIA)    Difficulty of Paying Living Expenses: Very hard  Food Insecurity: Food Insecurity Present (07/02/2024)   Hunger Vital Sign    Worried About Running Out of Food in the Last Year: Sometimes true    Ran Out of Food in the Last Year: Sometimes true  Transportation Needs: No Transportation Needs (07/02/2024)   PRAPARE - Administrator, Civil Service (Medical): No    Lack of Transportation (Non-Medical): No  Physical Activity: Insufficiently  Active (07/02/2024)   Exercise Vital Sign    Days of Exercise per Week: 2 days    Minutes of Exercise per Session: 20 min  Stress: No Stress Concern Present (07/02/2024)   [employer] of Occupational Health - Occupational Stress Questionnaire    Feeling of Stress: Only a little  Social Connections: Moderately Isolated (07/02/2024)   Social Connection and Isolation Panel    Frequency of Communication with Friends and Family: Once a week    Frequency of Social Gatherings with Friends and Family: Once a week  Attends Religious Services: 1 to 4 times per year    Active Member of Clubs or Organizations: Yes    Attends Engineer, structural: More than 4 times per year    Marital Status: Divorced  Catering manager Violence: Not on file    Outpatient Medications Prior to Visit  Medication Sig Dispense Refill   ACCU-CHEK GUIDE TEST test strip Use as instructed 300 strip 2   amitriptyline  (ELAVIL ) 75 MG tablet TAKE 1 TABLET BY MOUTH AT BEDTIME 90 tablet 1   aspirin  EC 81 MG tablet Take 81 mg by mouth daily.     DULoxetine  (CYMBALTA ) 60 MG capsule Take 1 capsule (60 mg total) by mouth daily. 90 capsule 2   ezetimibe  (ZETIA ) 10 MG tablet Take 1 tablet (10 mg total) by mouth daily. 90 tablet 3   famotidine  (PEPCID ) 20 MG tablet Take 1 tablet by mouth every morning. 90 tablet 1   fenofibrate  (TRICOR ) 48 MG tablet TAKE 1 TABLET BY MOUTH DAILY 90 tablet 1   fexofenadine  (ALLEGRA  ALLERGY) 180 MG tablet Take 1 tablet (180 mg total) by mouth daily. 30 tablet 3   fluticasone  (FLONASE ) 50 MCG/ACT nasal spray Place 2 sprays into both nostrils daily. 16 g 0   gabapentin  (NEURONTIN ) 600 MG tablet TAKE 1 AND 1/2 TABLETS BY MOUTH 3 TIMES DAILY 270 tablet 3   HYDROcodone -acetaminophen  (NORCO/VICODIN) 5-325 MG tablet Take 1 tablet by mouth every 6 (six) hours as needed for moderate pain (pain score 4-6).     hydrOXYzine  (ATARAX ) 25 MG tablet TAKE 1 TABLET BY MOUTH EVERY 8 HOURS AS NEEDED FOR ANXIETY  90 tablet 1   LINZESS  145 MCG CAPS capsule Take 1 capsule (145 mcg total) by mouth every morning. 90 capsule 1   losartan -hydrochlorothiazide (HYZAAR) 100-12.5 MG tablet Take 1 tablet by mouth daily. 90 tablet 1   methocarbamol  (ROBAXIN ) 500 MG tablet Take 1 tablet (500 mg total) by mouth 4 (four) times daily. 30 tablet 0   predniSONE  (DELTASONE ) 20 MG tablet Take 2 tablets (40 mg total) by mouth daily with breakfast. 10 tablet 0   rosuvastatin  (CRESTOR ) 40 MG tablet Take 1 tablet (40 mg total) by mouth daily. 90 tablet 1   LANTUS  SOLOSTAR 100 UNIT/ML Solostar Pen Inject 65 Units into the skin at bedtime.     OZEMPIC , 1 MG/DOSE, 4 MG/3ML SOPN INJECT 1 MG below THE SKIN AS DIRECTED ONCE WEEKLY 3 mL 2   Cholecalciferol (VITAMIN D3) 3000 units TABS Take 3,000 Units by mouth daily as needed (vitamin d ). (Patient not taking: Reported on 09/05/2024)     cyanocobalamin  (VITAMIN B12) 1000 MCG tablet Take 1,000 mcg by mouth daily. (Patient not taking: Reported on 09/05/2024)     fluconazole  (DIFLUCAN ) 150 MG tablet Take 1 tablet PO once. Repeat in 3 days if needed. (Patient not taking: Reported on 09/05/2024) 2 tablet 0   No facility-administered medications prior to visit.    Allergies  Allergen Reactions   Doxycycline  Hives and Itching   Flagyl [Metronidazole] Hives and Itching    Benadryl      ROS Review of Systems  Constitutional:  Negative for appetite change, chills, fatigue and fever.  HENT:  Negative for congestion, postnasal drip, rhinorrhea and sneezing.   Respiratory:  Negative for cough, shortness of breath and wheezing.   Cardiovascular:  Negative for chest pain, palpitations and leg swelling.  Gastrointestinal:  Negative for abdominal pain, constipation, nausea and vomiting.  Genitourinary:  Negative for difficulty urinating, dysuria, flank  pain and frequency.  Musculoskeletal:  Positive for arthralgias. Negative for joint swelling and myalgias.  Skin:  Negative for color change,  pallor, rash and wound.  Neurological:  Negative for dizziness, facial asymmetry, weakness and headaches.  Psychiatric/Behavioral:  Positive for sleep disturbance. Negative for behavioral problems, confusion, self-injury and suicidal ideas.       Objective:    Physical Exam Vitals and nursing note reviewed.  Constitutional:      General: She is not in acute distress.    Appearance: Normal appearance. She is obese. She is not ill-appearing, toxic-appearing or diaphoretic.  Eyes:     General: No scleral icterus.       Right eye: No discharge.        Left eye: No discharge.     Extraocular Movements: Extraocular movements intact.     Conjunctiva/sclera: Conjunctivae normal.  Cardiovascular:     Rate and Rhythm: Normal rate and regular rhythm.     Pulses: Normal pulses.     Heart sounds: Normal heart sounds. No murmur heard.    No friction rub. No gallop.  Pulmonary:     Effort: Pulmonary effort is normal. No respiratory distress.     Breath sounds: Normal breath sounds. No stridor. No wheezing, rhonchi or rales.  Chest:     Chest wall: No tenderness.  Abdominal:     General: There is no distension.     Palpations: Abdomen is soft.     Tenderness: There is no abdominal tenderness. There is no right CVA tenderness, left CVA tenderness or guarding.  Musculoskeletal:        General: No swelling or signs of injury.     Right lower leg: No edema.     Left lower leg: No edema.  Skin:    General: Skin is warm and dry.     Capillary Refill: Capillary refill takes less than 2 seconds.     Coloration: Skin is not jaundiced or pale.     Findings: No bruising, erythema or lesion.  Neurological:     Mental Status: She is alert and oriented to person, place, and time.     Motor: No weakness.     Gait: Gait normal.  Psychiatric:        Mood and Affect: Mood normal.        Behavior: Behavior normal.        Thought Content: Thought content normal.        Judgment: Judgment normal.      BP 128/69   Pulse 74   Wt 217 lb (98.4 kg)   SpO2 98%   BMI 33.99 kg/m  Wt Readings from Last 3 Encounters:  09/05/24 217 lb (98.4 kg)  08/02/24 221 lb (100.2 kg)  03/04/24 221 lb 9.6 oz (100.5 kg)    Lab Results  Component Value Date   TSH 0.57 03/19/2022   Lab Results  Component Value Date   WBC 7.7 12/24/2022   HGB 13.3 12/24/2022   HCT 41.9 12/24/2022   MCV 87.1 12/24/2022   PLT 371 12/24/2022   Lab Results  Component Value Date   NA 143 03/04/2024   K 4.1 03/04/2024   CO2 24 03/04/2024   GLUCOSE 93 03/04/2024   BUN 9 03/04/2024   CREATININE 0.79 03/04/2024   BILITOT <0.2 03/04/2024   ALKPHOS 99 03/04/2024   AST 12 03/04/2024   ALT 16 03/04/2024   PROT 6.8 03/04/2024   ALBUMIN 4.2 03/04/2024   CALCIUM  9.2 03/04/2024  ANIONGAP 9 12/24/2022   EGFR 87 03/04/2024   Lab Results  Component Value Date   CHOL 123 03/04/2024   Lab Results  Component Value Date   HDL 59 03/04/2024   Lab Results  Component Value Date   LDLCALC 45 03/04/2024   Lab Results  Component Value Date   TRIG 106 03/04/2024   Lab Results  Component Value Date   CHOLHDL 2.1 03/04/2024   Lab Results  Component Value Date   HGBA1C 7.1 (A) 09/05/2024      Assessment & Plan:   Problem List Items Addressed This Visit       Cardiovascular and Mediastinum   HTN (hypertension)   BP Readings from Last 3 Encounters:  09/05/24 128/69  08/02/24 135/88  07/08/24 119/83   Hypertension Well-controlled with Hyzaar. BP 128/69 mmHg, continue losartan -hydrochlorothiazide 100-12.5 mg 1 tablet daily Losartan  provides kidney protection.         Endocrine   Type 2 diabetes mellitus with diabetic autonomic neuropathy, with long-term current use of insulin  (HCC) - Primary   Lab Results  Component Value Date   HGBA1C 7.1 (A) 09/05/2024   Type 2 diabetes mellitus with diabetic autonomic neuropathy and stage 2 chronic kidney disease A1c improved to 7.1. Goal A1c <7 to prevent  complications. Stage 2 CKD with eGFR 62, likely diabetes-related. - Increase Ozempic  to 2 mg weekly. - Decrease Lantus  to 52 units daily. - Monitor blood glucose: fasting 80-120 mg/dL, postprandial <829 mg/dL.  Advised to report CBG readings consistently less than 70  - Stay hydrated, avoid NSAIDs. - Consult pharmacist for alternative insulin  pens if needed. Counseled on low-carb diet          Relevant Medications   LANTUS  SOLOSTAR 100 UNIT/ML Solostar Pen   Semaglutide , 2 MG/DOSE, (OZEMPIC , 2 MG/DOSE,) 8 MG/3ML SOPN   Other Relevant Orders   POCT glycosylated hemoglobin (Hb A1C) (Completed)     Genitourinary   CKD (chronic kidney disease) stage 2, GFR 60-89 ml/min   Most recent eGFR 62 Avoid NSAIDs and other nephrotoxic agent Regularly 64 ounces of water daily to maintain hydration Continue  Hyzaar for HTN mgt, taking Ozempic  for type 2 diabetes        Other   Dyslipidemia, goal LDL below 70   Hyperlipidemia LDL at 38 mg/dL, well-controlled with current medications. - Continue Zetia  10 mg daily, fenofibrate  48 mg daily, and rosuvastatin  40 mg daily.       Tobacco abuse   Smokes about 1 pack/day  Asked about quitting: confirms that she currently smokes cigarettes Advise to quit smoking: Educated about QUITTING to reduce the risk of cancer, cardio and cerebrovascular disease. Assess willingness: unwilling to quit at this time, not working on cutting back. Assist with counseling and pharmacotherapy: Counseled for 5 minutes and literature provided. Arrange for follow up: follow up in 3 months and continue to offer help.       Insomnia    Possibly related to anxiety. - Continue  hydroxyzine  25mg  as needed        Chronic pain   Chronic pain due to diabetic neuropathy Chronic pain in feet with burning and tingling. - Continue gabapentin  300 mg tablet take 1-1/2 tablet 3 times daily, Norco 5 325 mg 1 tablet every 6 hours as needed, amitriptyline  75 mg daily,  duloxetine  60 mg daily       Anxiety and depression      09/05/2024    1:46 PM 08/02/2024   10:44 AM 08/14/2023  10:30 AM  Depression screen PHQ 2/9  Decreased Interest 2 1 3   Down, Depressed, Hopeless 2 1 0  PHQ - 2 Score 4 2 3   Altered sleeping 3 3 1   Tired, decreased energy 3 3 1   Change in appetite 3 3 3   Feeling bad or failure about yourself  0 0 0  Trouble concentrating 0 0 0  Moving slowly or fidgety/restless 1 3 2   Suicidal thoughts 0 0 0  PHQ-9 Score 14 14 10   Difficult doing work/chores Somewhat difficult Not difficult at all Very difficult       09/05/2024    1:47 PM 08/02/2024   10:49 AM 01/23/2017    9:40 AM  GAD 7 : Generalized Anxiety Score  Nervous, Anxious, on Edge 3 1 2   Control/stop worrying 1 1 2   Worry too much - different things 3 1 1   Trouble relaxing 1 1 2   Restless 3 3 0  Easily annoyed or irritable 3 1 2   Afraid - awful might happen 0 0 0  Total GAD 7 Score 14 8 9   Anxiety Difficulty Somewhat difficult Not difficult at all Not difficult at all   Possibly exacerbated by chronic pain. Relies on faith for coping. - Continue duloxetine  60 mg daily, amitriptyline  75 mg at bedtime and hydroxyzine  25 mg 3 times daily as needed as needed.         Meds ordered this encounter  Medications   LANTUS  SOLOSTAR 100 UNIT/ML Solostar Pen    Sig: Inject 52 Units into the skin at bedtime.   Semaglutide , 2 MG/DOSE, (OZEMPIC , 2 MG/DOSE,) 8 MG/3ML SOPN    Sig: Inject 2 mg into the skin once a week.    Dispense:  3 mL    Refill:  0    Follow-up: Return in about 3 months (around 12/05/2024) for CPE.    Erna Brossard R Shashank Kwasnik, FNP

## 2024-09-05 NOTE — Assessment & Plan Note (Signed)
 Most recent eGFR 62 Avoid NSAIDs and other nephrotoxic agent Regularly 64 ounces of water daily to maintain hydration Continue  Hyzaar for HTN mgt, taking Ozempic  for type 2 diabetes

## 2024-09-05 NOTE — Assessment & Plan Note (Addendum)
 Lab Results  Component Value Date   HGBA1C 7.1 (A) 09/05/2024   Type 2 diabetes mellitus with diabetic autonomic neuropathy and stage 2 chronic kidney disease A1c improved to 7.1. Goal A1c <7 to prevent complications. Stage 2 CKD with eGFR 62, likely diabetes-related. - Increase Ozempic  to 2 mg weekly. - Decrease Lantus  to 52 units daily. - Monitor blood glucose: fasting 80-120 mg/dL, postprandial <829 mg/dL.  Advised to report CBG readings consistently less than 70  - Stay hydrated, avoid NSAIDs. - Consult pharmacist for alternative insulin  pens if needed. Counseled on low-carb diet

## 2024-09-05 NOTE — Assessment & Plan Note (Addendum)
 Smokes about 1 pack/day  Asked about quitting: confirms that she currently smokes cigarettes Advise to quit smoking: Educated about QUITTING to reduce the risk of cancer, cardio and cerebrovascular disease. Assess willingness: unwilling to quit at this time, not working on cutting back. Assist with counseling and pharmacotherapy: Counseled for 5 minutes and literature provided. Arrange for follow up: follow up in 3 months and continue to offer help.

## 2024-09-05 NOTE — Assessment & Plan Note (Signed)
 Chronic pain due to diabetic neuropathy Chronic pain in feet with burning and tingling. - Continue gabapentin  300 mg tablet take 1-1/2 tablet 3 times daily, Norco 5 325 mg 1 tablet every 6 hours as needed, amitriptyline  75 mg daily, duloxetine  60 mg daily

## 2024-09-05 NOTE — Patient Instructions (Addendum)
 Goal for fasting blood sugar ranges from 80 to 120 and 2 hours after any meal or at bedtime should be between 130 to 170.    1. Type 2 diabetes mellitus with diabetic autonomic neuropathy, with long-term current use of insulin  (HCC) (Primary)  - POCT glycosylated hemoglobin (Hb A1C) - LANTUS  SOLOSTAR 100 UNIT/ML Solostar Pen; Inject 52 Units into the skin at bedtime. - Semaglutide , 2 MG/DOSE, (OZEMPIC , 2 MG/DOSE,) 8 MG/3ML SOPN; Inject 2 mg into the skin once a week.  Dispense: 3 mL; Refill: 0    It is important that you exercise regularly at least 30 minutes 5 times a week as tolerated  Think about what you will eat, plan ahead. Choose  clean, green, fresh or frozen over canned, processed or packaged foods which are more sugary, salty and fatty. 70 to 75% of food eaten should be vegetables and fruit. Three meals at set times with snacks allowed between meals, but they must be fruit or vegetables. Aim to eat over a 12 hour period , example 7 am to 7 pm, and STOP after  your last meal of the day. Drink water,generally about 64 ounces per day, no other drink is as healthy. Fruit juice is best enjoyed in a healthy way, by EATING the fruit.  Thanks for choosing [facility] we consider it a privelige to serve you.

## 2024-09-05 NOTE — Assessment & Plan Note (Signed)
  Possibly related to anxiety. - Continue  hydroxyzine  25mg  as needed

## 2024-09-15 ENCOUNTER — Telehealth: Payer: Self-pay | Admitting: Nurse Practitioner

## 2024-09-15 ENCOUNTER — Telehealth: Payer: Self-pay

## 2024-09-15 DIAGNOSIS — J302 Other seasonal allergic rhinitis: Secondary | ICD-10-CM

## 2024-09-15 DIAGNOSIS — Z794 Long term (current) use of insulin: Secondary | ICD-10-CM

## 2024-09-15 DIAGNOSIS — E785 Hyperlipidemia, unspecified: Secondary | ICD-10-CM

## 2024-09-15 NOTE — Telephone Encounter (Signed)
 Orthotics are here Charges pending insurance No financial form signed or on file

## 2024-09-15 NOTE — Telephone Encounter (Signed)
 Pt was advised to wait for her dec. Apt for refills. Pt is also requesting a script for lidocaine  patches. Please advise . KH

## 2024-09-15 NOTE — Telephone Encounter (Signed)
 Lidocaine  patches, not on current list.

## 2024-09-15 NOTE — Telephone Encounter (Unsigned)
 Copied from CRM [id number]. Topic: Clinical - Medication Refill >> Sep 15, 2024  9:27 AM Turkey B wrote: Medication: Lidocaine  patches  Has the patient contacted their pharmacy? yes (Agent: If yes, when and what did the pharmacy advise?)pharmacy called directly in  This is the patient's preferred pharmacy:  [facility] - [place], [place] - [address] [place] [address] Phone: [phone] Fax: [phone]   Is this the correct pharmacy for this prescription? yes Has the prescription been filled recently? no  Is the patient out of the medication? yes  Has the patient been seen for an appointment in the last year OR does the patient have an upcoming appointment? yes  Can we respond through MyChart? yes  Agent: Please be advised that Rx refills may take up to 3 business days. We ask that you follow-up with your pharmacy.

## 2024-09-15 NOTE — Telephone Encounter (Signed)
 Just called to fin out the name of the med. That is needed. KH

## 2024-09-15 NOTE — Telephone Encounter (Unsigned)
 Copied from CRM [id number]. Topic: Clinical - Medication Refill >> Sep 15, 2024  8:27 AM Avram MATSU wrote: Medication: LANTUS  SOLOSTAR 100 UNIT/ML Solostar Pen [[id number]]  Has the patient contacted their pharmacy? Yes (Agent: If no, request that the patient contact the pharmacy for the refill. If patient does not wish to contact the pharmacy document the reason why and proceed with request.) (Agent: If yes, when and what did the pharmacy advise?)  This is the patient's preferred pharmacy:  [facility] - [place], [place] - [address] [place] [address] Phone: [phone] Fax: [phone]   Is this the correct pharmacy for this prescription? Yes If no, delete pharmacy and type the correct one.   Has the prescription been filled recently? No  Is the patient out of the medication? Yes  Has the patient been seen for an appointment in the last year OR does the patient have an upcoming appointment? Yes  Can we respond through MyChart? Yes  Agent: Please be advised that Rx refills may take up to 3 business days. We ask that you follow-up with your pharmacy.

## 2024-09-16 ENCOUNTER — Ambulatory Visit: Admitting: Podiatry

## 2024-09-16 ENCOUNTER — Other Ambulatory Visit: Payer: Self-pay | Admitting: Nurse Practitioner

## 2024-09-16 DIAGNOSIS — M216X2 Other acquired deformities of left foot: Secondary | ICD-10-CM

## 2024-09-16 DIAGNOSIS — M216X1 Other acquired deformities of right foot: Secondary | ICD-10-CM

## 2024-09-16 DIAGNOSIS — M722 Plantar fascial fibromatosis: Secondary | ICD-10-CM

## 2024-09-16 DIAGNOSIS — M549 Dorsalgia, unspecified: Secondary | ICD-10-CM

## 2024-09-16 DIAGNOSIS — E1142 Type 2 diabetes mellitus with diabetic polyneuropathy: Secondary | ICD-10-CM

## 2024-09-16 MED ORDER — LIDOCAINE 5 % EX PTCH: 1.0000 | MEDICATED_PATCH | CUTANEOUS | 0 refills | Status: DC

## 2024-09-16 NOTE — Progress Notes (Addendum)
 Subjective:  Patient ID: Ellen Avery, female    DOB: 1966-07-19,  MRN: [id number]  Chief Complaint  Patient presents with   Plantar Fasciitis    Pt stated that she is still having issues     58 y.o. female presents with the above complaint.  Patient presents for follow-up of right plantar fasciitis she states she is doing better denies any other acute complaints.  She states that the injection helped considerably but she still has some residual pain denies any other acute issues   Review of Systems: Negative except as noted in the HPI. Denies N/V/F/Ch.  Past Medical History:  Diagnosis Date   Allergy At 58 years old   Anxiety    Arthritis    Chronic headaches    DDD (degenerative disc disease), cervical    Fibromyalgia    GERD (gastroesophageal reflux disease)    History of colon polyps    History of psychosis    Hypercholesteremia    Hypertension    IBS (irritable bowel syndrome)    IBS (irritable bowel syndrome)    MDD (major depressive disorder)    Right sided facial pain    Seasonal allergic rhinitis    Sinus congestion    12-24-2018 per pt no fever   Type 2 diabetes mellitus treated with insulin  (HCC)    Vitamin D  deficiency    Wears dentures    full upper,  partial lower   Wears glasses     Current Outpatient Medications:    ACCU-CHEK GUIDE TEST test strip, Use as instructed, Disp: 300 strip, Rfl: 2   amitriptyline  (ELAVIL ) 75 MG tablet, TAKE 1 TABLET BY MOUTH AT BEDTIME, Disp: 90 tablet, Rfl: 1   aspirin  EC 81 MG tablet, Take 81 mg by mouth daily., Disp: , Rfl:    Cholecalciferol (VITAMIN D3) 3000 units TABS, Take 3,000 Units by mouth daily as needed (vitamin d ). (Patient not taking: Reported on 09/05/2024), Disp: , Rfl:    cyanocobalamin  (VITAMIN B12) 1000 MCG tablet, Take 1,000 mcg by mouth daily. (Patient not taking: Reported on 09/05/2024), Disp: , Rfl:    DULoxetine  (CYMBALTA ) 60 MG capsule, Take 1 capsule (60 mg total) by mouth daily., Disp: 90 capsule,  Rfl: 2   ezetimibe  (ZETIA ) 10 MG tablet, Take 1 tablet (10 mg total) by mouth daily., Disp: 90 tablet, Rfl: 3   famotidine  (PEPCID ) 20 MG tablet, Take 1 tablet by mouth every morning., Disp: 90 tablet, Rfl: 1   fenofibrate  (TRICOR ) 48 MG tablet, TAKE 1 TABLET BY MOUTH DAILY, Disp: 90 tablet, Rfl: 1   fexofenadine  (ALLEGRA  ALLERGY) 180 MG tablet, Take 1 tablet (180 mg total) by mouth daily., Disp: 30 tablet, Rfl: 3   fluconazole  (DIFLUCAN ) 150 MG tablet, Take 1 tablet PO once. Repeat in 3 days if needed. (Patient not taking: Reported on 09/05/2024), Disp: 2 tablet, Rfl: 0   fluticasone  (FLONASE ) 50 MCG/ACT nasal spray, Place 2 sprays into both nostrils daily., Disp: 16 g, Rfl: 0   gabapentin  (NEURONTIN ) 600 MG tablet, TAKE 1 AND 1/2 TABLETS BY MOUTH 3 TIMES DAILY, Disp: 270 tablet, Rfl: 3   HYDROcodone -acetaminophen  (NORCO/VICODIN) 5-325 MG tablet, Take 1 tablet by mouth every 6 (six) hours as needed for moderate pain (pain score 4-6)., Disp: , Rfl:    hydrOXYzine  (ATARAX ) 25 MG tablet, TAKE 1 TABLET BY MOUTH EVERY 8 HOURS AS NEEDED FOR ANXIETY, Disp: 90 tablet, Rfl: 1   LANTUS  SOLOSTAR 100 UNIT/ML Solostar Pen, Inject 52 Units into the skin  at bedtime., Disp: , Rfl:    LINZESS  145 MCG CAPS capsule, Take 1 capsule (145 mcg total) by mouth every morning., Disp: 90 capsule, Rfl: 1   losartan -hydrochlorothiazide (HYZAAR) 100-12.5 MG tablet, Take 1 tablet by mouth daily., Disp: 90 tablet, Rfl: 1   methocarbamol  (ROBAXIN ) 500 MG tablet, Take 1 tablet (500 mg total) by mouth 4 (four) times daily., Disp: 30 tablet, Rfl: 0   predniSONE  (DELTASONE ) 20 MG tablet, Take 2 tablets (40 mg total) by mouth daily with breakfast., Disp: 10 tablet, Rfl: 0   rosuvastatin  (CRESTOR ) 40 MG tablet, Take 1 tablet (40 mg total) by mouth daily., Disp: 90 tablet, Rfl: 1   Semaglutide , 2 MG/DOSE, (OZEMPIC , 2 MG/DOSE,) 8 MG/3ML SOPN, Inject 2 mg into the skin once a week., Disp: 3 mL, Rfl: 0  Social History   Tobacco Use   Smoking Status Every Day   Current packs/day: 1.00   Average packs/day: 1 pack/day for 35.0 years (35.0 ttl pk-yrs)   Types: Cigarettes  Smokeless Tobacco Never    Allergies  Allergen Reactions   Doxycycline  Hives and Itching   Flagyl [Metronidazole] Hives and Itching    Benadryl     Objective:  There were no vitals filed for this visit. There is no height or weight on file to calculate BMI. Constitutional Well developed. Well nourished.  Vascular Dorsalis pedis pulses palpable bilaterally. Posterior tibial pulses palpable bilaterally. Capillary refill normal to all digits.  No cyanosis or clubbing noted. Pedal hair growth normal.  Neurologic Normal speech. Oriented to person, place, and time. Epicritic sensation to light touch grossly present bilaterally.  Dermatologic Nails well groomed and normal in appearance. No open wounds. No skin lesions.  Orthopedic: Normal joint ROM without pain or crepitus bilaterally. No visible deformities. Tender to palpation at the calcaneal tuber right. No pain with calcaneal squeeze right. Ankle ROM diminished range of motion right. Silfverskiold Test: positive right.   Radiographs: None  Assessment:   1. Plantar fasciitis of right foot   2. Other acquired deformities of right foot   3. Other acquired deformities of left foot    Plan:  Patient was evaluated and treated and all questions answered.  Plantar Fasciitis, right - XR reviewed as above.  - Educated on icing and stretching. Instructions given.  - Second injection delivered to the plantar fascia as below. - DME: Plantar fascial brace dispensed to support the medial longitudinal arch of the foot and offload pressure from the heel and prevent arch collapse during weightbearing - Pharmacologic management: None  Pes planovalgus -I explained to patient the etiology of pes planovalgus and relationship with Planter fasciitis and various treatment options were discussed.  Given  patient foot structure in the setting of Planter fasciitis I believe patient will benefit from custom-made orthotics to help control the hindfoot motion support the arch of the foot and take the stress away from plantar fascial.  Patient agrees with the plan like to proceed with orthotics - Orthotics were dispensed they are functioning well no acute complaints   No follow-ups on file.

## 2024-09-16 NOTE — Telephone Encounter (Signed)
 Requesting refill.  Send return message to [employer]

## 2024-09-19 NOTE — Telephone Encounter (Signed)
Done Kh 

## 2024-09-26 ENCOUNTER — Other Ambulatory Visit: Payer: Self-pay

## 2024-10-10 ENCOUNTER — Other Ambulatory Visit: Payer: Self-pay | Admitting: Nurse Practitioner

## 2024-10-10 DIAGNOSIS — E1143 Type 2 diabetes mellitus with diabetic autonomic (poly)neuropathy: Secondary | ICD-10-CM

## 2024-10-17 DIAGNOSIS — M79604 Pain in right leg: Secondary | ICD-10-CM | POA: Diagnosis not present

## 2024-10-17 DIAGNOSIS — Z79899 Other long term (current) drug therapy: Secondary | ICD-10-CM | POA: Diagnosis not present

## 2024-10-17 DIAGNOSIS — G894 Chronic pain syndrome: Secondary | ICD-10-CM | POA: Diagnosis not present

## 2024-10-17 DIAGNOSIS — E6609 Other obesity due to excess calories: Secondary | ICD-10-CM | POA: Diagnosis not present

## 2024-10-17 DIAGNOSIS — Z6834 Body mass index (BMI) 34.0-34.9, adult: Secondary | ICD-10-CM | POA: Diagnosis not present

## 2024-10-17 DIAGNOSIS — M79605 Pain in left leg: Secondary | ICD-10-CM | POA: Diagnosis not present

## 2024-10-17 DIAGNOSIS — Z6833 Body mass index (BMI) 33.0-33.9, adult: Secondary | ICD-10-CM | POA: Diagnosis not present

## 2024-10-18 ENCOUNTER — Other Ambulatory Visit: Payer: Self-pay | Admitting: Nurse Practitioner

## 2024-10-18 ENCOUNTER — Telehealth: Payer: Self-pay

## 2024-10-18 ENCOUNTER — Other Ambulatory Visit: Payer: Self-pay

## 2024-10-18 DIAGNOSIS — M549 Dorsalgia, unspecified: Secondary | ICD-10-CM

## 2024-10-18 NOTE — Telephone Encounter (Signed)
 Copied from CRM [id number]. Topic: Clinical - Medication Prior Auth >> Oct 18, 2024  9:12 AM Avram MATSU wrote: Reason for CRM: [facility] is calling about two PA that was sent in today and one that was sent out on 10/10. Both medication lidocaine  (LIDODERM ) 5 % [[id number] & fexofenadine  (ALLEGRA  ALLERGY) 180 MG tablet [[id number]] please advise [phone]   Please advise. Thank you. KH

## 2024-10-18 NOTE — Telephone Encounter (Signed)
 Please advise [facility]

## 2024-10-20 DIAGNOSIS — Z79899 Other long term (current) drug therapy: Secondary | ICD-10-CM | POA: Diagnosis not present

## 2024-10-26 ENCOUNTER — Telehealth: Admitting: Family Medicine

## 2024-10-26 DIAGNOSIS — R0981 Nasal congestion: Secondary | ICD-10-CM

## 2024-10-26 DIAGNOSIS — R5383 Other fatigue: Secondary | ICD-10-CM

## 2024-10-26 NOTE — Progress Notes (Signed)
 Virtual Visit Consent   GENICE KIMBERLIN, you are scheduled for a virtual visit with a [facility] provider today. Just as with appointments in the office, your consent must be obtained to participate. Your consent will be active for this visit and any virtual visit you may have with one of our providers in the next 365 days. If you have a MyChart account, a copy of this consent can be sent to you electronically.  As this is a virtual visit, video technology does not allow for your provider to perform a traditional examination. This may limit your provider's ability to fully assess your condition. If your provider identifies any concerns that need to be evaluated in person or the need to arrange testing (such as labs, EKG, etc.), we will make arrangements to do so. Although advances in technology are sophisticated, we cannot ensure that it will always work on either your end or our end. If the connection with a video visit is poor, the visit may have to be switched to a telephone visit. With either a video or telephone visit, we are not always able to ensure that we have a secure connection.  By engaging in this virtual visit, you consent to the provision of healthcare and authorize for your insurance to be billed (if applicable) for the services provided during this visit. Depending on your insurance coverage, you may receive a charge related to this service.  I need to obtain your verbal consent now. Are you willing to proceed with your visit today? Ellen Avery has provided verbal consent on 10/26/2024 for a virtual visit (video or telephone). Loa Lamp, FNP  Date: 10/26/2024 7:48 PM   Virtual Visit via Video Note   I, Loa Lamp, connected with  Ellen Avery  ([id number], 1966/09/30) on 10/26/24 at  7:45 PM EST by a video-enabled telemedicine application and verified that I am speaking with the correct person using two identifiers.  Location: Patient: Virtual Visit Location Patient:  Home Provider: Virtual Visit Location Provider: Home Office   I discussed the limitations of evaluation and management by telemedicine and the availability of in person appointments. The patient expressed understanding and agreed to proceed.    History of Present Illness: Ellen Avery is a 58 y.o. who identifies as a female who was assigned female at birth, and is being seen today for head congestion and fatigue for 2 weeks. Neg covid home test today. Had antibiotic and that did not help symptoms. She says the rescue squad was just at her home and vitals checked out normal earlier. They told her to go to UC.   HPI: HPI  Problems:  Patient Active Problem List   Diagnosis Date Noted   Neuropathic pain 09/05/2024   Chronic pain 09/05/2024   Anxiety and depression 09/05/2024   CKD (chronic kidney disease) stage 2, GFR 60-89 ml/min 09/05/2024   Chronic pain of both feet 08/02/2024   Chronic heel pain, left 08/02/2024   Insomnia 08/02/2024   Dysuria 03/04/2024   Diabetic polyneuropathy associated with type 2 diabetes mellitus (HCC) 07/24/2023   Acute URI 07/24/2023   Annual physical exam 04/22/2023   Type 2 diabetes mellitus with diabetic autonomic neuropathy, with long-term current use of insulin  (HCC) 04/22/2023   Seasonal allergic rhinitis 04/22/2023   Tobacco abuse 01/21/2023   Anxiety 01/21/2023   Uncontrolled type 2 diabetes mellitus with hyperglycemia (HCC) 10/22/2022   Tobacco abuse counseling 10/22/2022   Leg pain, bilateral 10/22/2022   Fibromyalgia 10/22/2022  Chest pain in adult 07/20/2019   IDDM (insulin  dependent diabetes mellitus) 07/08/2018   HTN (hypertension) 07/08/2018   Dyslipidemia, goal LDL below 70 07/08/2018   Altered mental status 03/02/2016   MDD (major depressive disorder), recurrent episode, moderate (HCC) 03/02/2016   Psychoses (HCC)     Allergies:  Allergies  Allergen Reactions   Doxycycline  Hives and Itching   Flagyl [Metronidazole] Hives and  Itching    Benadryl     Medications:  Current Outpatient Medications:    ACCU-CHEK GUIDE TEST test strip, Use as instructed, Disp: 300 strip, Rfl: 2   amitriptyline  (ELAVIL ) 75 MG tablet, TAKE 1 TABLET BY MOUTH AT BEDTIME, Disp: 90 tablet, Rfl: 1   aspirin  EC 81 MG tablet, Take 81 mg by mouth daily., Disp: , Rfl:    Cholecalciferol (VITAMIN D3) 3000 units TABS, Take 3,000 Units by mouth daily as needed (vitamin d ). (Patient not taking: Reported on 09/05/2024), Disp: , Rfl:    cyanocobalamin  (VITAMIN B12) 1000 MCG tablet, Take 1,000 mcg by mouth daily. (Patient not taking: Reported on 09/05/2024), Disp: , Rfl:    DULoxetine  (CYMBALTA ) 60 MG capsule, Take 1 capsule (60 mg total) by mouth daily., Disp: 90 capsule, Rfl: 2   ezetimibe  (ZETIA ) 10 MG tablet, Take 1 tablet (10 mg total) by mouth daily., Disp: 90 tablet, Rfl: 3   famotidine  (PEPCID ) 20 MG tablet, Take 1 tablet by mouth every morning., Disp: 90 tablet, Rfl: 1   fenofibrate  (TRICOR ) 48 MG tablet, TAKE 1 TABLET BY MOUTH DAILY, Disp: 90 tablet, Rfl: 1   fexofenadine  (ALLEGRA  ALLERGY) 180 MG tablet, Take 1 tablet (180 mg total) by mouth daily., Disp: 30 tablet, Rfl: 3   fluconazole  (DIFLUCAN ) 150 MG tablet, Take 1 tablet PO once. Repeat in 3 days if needed. (Patient not taking: Reported on 09/05/2024), Disp: 2 tablet, Rfl: 0   fluticasone  (FLONASE ) 50 MCG/ACT nasal spray, Place 2 sprays into both nostrils daily., Disp: 16 g, Rfl: 0   gabapentin  (NEURONTIN ) 600 MG tablet, TAKE 1 AND 1/2 TABLETS BY MOUTH 3 TIMES DAILY, Disp: 270 tablet, Rfl: 3   HYDROcodone -acetaminophen  (NORCO/VICODIN) 5-325 MG tablet, Take 1 tablet by mouth every 6 (six) hours as needed for moderate pain (pain score 4-6)., Disp: , Rfl:    hydrOXYzine  (ATARAX ) 25 MG tablet, TAKE 1 TABLET BY MOUTH EVERY 8 HOURS AS NEEDED FOR ANXIETY, Disp: 90 tablet, Rfl: 1   LANTUS  SOLOSTAR 100 UNIT/ML Solostar Pen, Inject 52 Units into the skin at bedtime., Disp: , Rfl:    lidocaine  (LIDODERM ) 5  %, Place 1 patch onto the skin daily. Remove & Discard patch within 12 hours or as directed by MD, Disp: 30 patch, Rfl: 0   LINZESS  145 MCG CAPS capsule, Take 1 capsule (145 mcg total) by mouth every morning., Disp: 90 capsule, Rfl: 1   losartan -hydrochlorothiazide (HYZAAR) 100-12.5 MG tablet, Take 1 tablet by mouth daily., Disp: 90 tablet, Rfl: 1   methocarbamol  (ROBAXIN ) 500 MG tablet, Take 1 tablet by mouth 4 times daily., Disp: 30 tablet, Rfl: 0   OZEMPIC , 2 MG/DOSE, 8 MG/3ML SOPN, Inject 2 mg into the skin once a week., Disp: 3 mL, Rfl: 0   predniSONE  (DELTASONE ) 20 MG tablet, Take 2 tablets (40 mg total) by mouth daily with breakfast., Disp: 10 tablet, Rfl: 0   rosuvastatin  (CRESTOR ) 40 MG tablet, Take 1 tablet (40 mg total) by mouth daily., Disp: 90 tablet, Rfl: 1  Observations/Objective: Patient is well-developed, well-nourished in no acute distress.  Resting  comfortably  at home.  Head is normocephalic, atraumatic.  No labored breathing.  Speech is clear and coherent with logical content.  Patient is alert and oriented at baseline.    Assessment and Plan: 1. Head congestion (Primary)  2. Other fatigue  Proceed to UC for labs etc.    Follow Up Instructions: I discussed the assessment and treatment plan with the patient. The patient was provided an opportunity to ask questions and all were answered. The patient agreed with the plan and demonstrated an understanding of the instructions.  A copy of instructions were sent to the patient via MyChart unless otherwise noted below.     The patient was advised to call back or seek an in-person evaluation if the symptoms worsen or if the condition fails to improve as anticipated.    Naomi Fitton, FNP

## 2024-10-26 NOTE — Patient Instructions (Signed)
 Fatigue If you have fatigue, you feel tired all the time and have a lack of energy or a lack of motivation. Fatigue may make it difficult to start or complete tasks because of exhaustion. Occasional or mild fatigue is often a normal response to activity or life. However, long-term (chronic) or extreme fatigue may be a symptom of a medical condition such as: Depression. Not having enough red blood cells or hemoglobin in the blood (anemia). A problem with a small gland located in the lower front part of the neck (thyroid disorder). Rheumatologic conditions. These are problems related to the body's defense system (immune system). Infections, especially certain viral infections. Fatigue can also lead to negative health outcomes over time. Follow these instructions at home: Medicines Take over-the-counter and prescription medicines only as told by your health care provider. Take a multivitamin if told by your health care provider. Do not use herbal or dietary supplements unless they are approved by your health care provider. Eating and drinking  Avoid heavy meals in the evening. Eat a well-balanced diet, which includes lean proteins, whole grains, plenty of fruits and vegetables, and low-fat dairy products. Avoid eating or drinking too many products with caffeine in them. Avoid alcohol. Drink enough fluid to keep your urine pale yellow. Activity  Exercise regularly, as told by your health care provider. Use or practice techniques to help you relax, such as yoga, tai chi, meditation, or massage therapy. Lifestyle Change situations that cause you stress. Try to keep your work and personal schedules in balance. Do not use recreational or illegal drugs. General instructions Monitor your fatigue for any changes. Go to bed and get up at the same time every day. Avoid fatigue by pacing yourself during the day and getting enough sleep at night. Maintain a healthy weight. Contact a health care  provider if: Your fatigue does not get better. You have a fever. You suddenly lose or gain weight. You have headaches. You have trouble falling asleep or sleeping through the night. You feel angry, guilty, anxious, or sad. You have swelling in your legs or another part of your body. Get help right away if: You feel confused, feel like you might faint, or faint. Your vision is blurry or you have a severe headache. You have severe pain in your abdomen, your back, or the area between your waist and hips (pelvis). You have chest pain, shortness of breath, or an irregular or fast heartbeat. You are unable to urinate, or you urinate less than normal. You have abnormal bleeding from the rectum, nose, lungs, nipples, or, if you are female, the vagina. You vomit blood. You have thoughts about hurting yourself or others. These symptoms may be an emergency. Get help right away. Call 911. Do not wait to see if the symptoms will go away. Do not drive yourself to the hospital. Get help right away if you feel like you may hurt yourself or others, or have thoughts about taking your own life. Go to your nearest emergency room or: Call 911. Call the National Suicide Prevention Lifeline at (262)721-8699 or 988. This is open 24 hours a day. Text the Crisis Text Line at 8450584327. Summary If you have fatigue, you feel tired all the time and have a lack of energy or a lack of motivation. Fatigue may make it difficult to start or complete tasks because of exhaustion. Long-term (chronic) or extreme fatigue may be a symptom of a medical condition. Exercise regularly, as told by your health care provider.  Change situations that cause you stress. Try to keep your work and personal schedules in balance. This information is not intended to replace advice given to you by your health care provider. Make sure you discuss any questions you have with your health care provider. Document Revised: 09/16/2021 Document  Reviewed: 09/16/2021 Elsevier Patient Education  2024 [employer].

## 2024-10-30 ENCOUNTER — Encounter (HOSPITAL_COMMUNITY): Payer: Self-pay

## 2024-10-30 ENCOUNTER — Ambulatory Visit (HOSPITAL_COMMUNITY): Admission: EM | Admit: 2024-10-30 | Discharge: 2024-10-30 | Disposition: A | Discharge status: Home / Self Care

## 2024-10-30 DIAGNOSIS — H60392 Other infective otitis externa, left ear: Secondary | ICD-10-CM

## 2024-10-30 DIAGNOSIS — J069 Acute upper respiratory infection, unspecified: Secondary | ICD-10-CM

## 2024-10-30 MED ORDER — IBUPROFEN 600 MG PO TABS: 600.0000 mg | ORAL_TABLET | Freq: Four times a day (QID) | ORAL | 0 refills | Status: AC | PRN

## 2024-10-30 MED ORDER — CIPROFLOXACIN-DEXAMETHASONE 0.3-0.1 % OT SUSP: 4.0000 [drp] | Freq: Two times a day (BID) | OTIC | 0 refills | Status: DC

## 2024-10-30 MED ORDER — AMOXICILLIN-POT CLAVULANATE 875-125 MG PO TABS: 1.0000 | ORAL_TABLET | Freq: Two times a day (BID) | ORAL | 0 refills | Status: AC

## 2024-10-30 NOTE — ED Triage Notes (Signed)
 Patient here today with c/o nasal congestion, left ear pain, body aches, and a little cough for over a week. Patient has taken Flonase  nasal spray and an old antibiotic with some relief but symptoms returned. Patient took a home Covid test and it was negative.

## 2024-10-30 NOTE — ED Provider Notes (Signed)
 [facility]  Note:  This document was prepared using Conservation officer, historic buildings and may include unintentional dictation errors.  MRN: [id number] DOB: 11-08-1966  Subjective:   Ellen Avery is a 58 y.o. female presenting for nasal congestion, left ear pain, body aches, mild cough x 1 week.  Patient denies any known sick contacts.  No shortness of breath, chest pain, weakness, dizziness.  Patient reports that she has been using Flonase  nasal spray and took some leftover Augmentin  from a previous sinus infection which seemed to help some of her symptoms but she is out of medication and here for evaluation to see if she needs alternative therapy.  Patient states that she took a home COVID test which was negative.  No current facility-administered medications for this encounter.  Current Outpatient Medications:    amoxicillin -clavulanate (AUGMENTIN ) 875-125 MG tablet, Take 1 tablet by mouth every 12 (twelve) hours for 5 days., Disp: 10 tablet, Rfl: 0   ciprofloxacin -dexamethasone  (CIPRODEX ) OTIC suspension, Place 4 drops into the left ear 2 (two) times daily., Disp: 7.5 mL, Rfl: 0   ibuprofen  (ADVIL ) 600 MG tablet, Take 1 tablet (600 mg total) by mouth every 6 (six) hours as needed., Disp: 30 tablet, Rfl: 0   ACCU-CHEK GUIDE TEST test strip, Use as instructed, Disp: 300 strip, Rfl: 2   amitriptyline  (ELAVIL ) 75 MG tablet, TAKE 1 TABLET BY MOUTH AT BEDTIME, Disp: 90 tablet, Rfl: 1   aspirin  EC 81 MG tablet, Take 81 mg by mouth daily., Disp: , Rfl:    Cholecalciferol (VITAMIN D3) 3000 units TABS, Take 3,000 Units by mouth daily as needed (vitamin d ). (Patient not taking: Reported on 09/05/2024), Disp: , Rfl:    cyanocobalamin  (VITAMIN B12) 1000 MCG tablet, Take 1,000 mcg by mouth daily. (Patient not taking: Reported on 09/05/2024), Disp: , Rfl:    DULoxetine  (CYMBALTA ) 60 MG capsule, Take 1 capsule (60 mg total) by mouth daily., Disp: 90 capsule, Rfl: 2   ezetimibe  (ZETIA ) 10  MG tablet, Take 1 tablet (10 mg total) by mouth daily., Disp: 90 tablet, Rfl: 3   famotidine  (PEPCID ) 20 MG tablet, Take 1 tablet by mouth every morning., Disp: 90 tablet, Rfl: 1   fenofibrate  (TRICOR ) 48 MG tablet, TAKE 1 TABLET BY MOUTH DAILY, Disp: 90 tablet, Rfl: 1   fexofenadine  (ALLEGRA  ALLERGY) 180 MG tablet, Take 1 tablet (180 mg total) by mouth daily., Disp: 30 tablet, Rfl: 3   fluticasone  (FLONASE ) 50 MCG/ACT nasal spray, Place 2 sprays into both nostrils daily., Disp: 16 g, Rfl: 0   gabapentin  (NEURONTIN ) 600 MG tablet, TAKE 1 AND 1/2 TABLETS BY MOUTH 3 TIMES DAILY, Disp: 270 tablet, Rfl: 3   HYDROcodone -acetaminophen  (NORCO/VICODIN) 5-325 MG tablet, Take 1 tablet by mouth every 6 (six) hours as needed for moderate pain (pain score 4-6)., Disp: , Rfl:    hydrOXYzine  (ATARAX ) 25 MG tablet, TAKE 1 TABLET BY MOUTH EVERY 8 HOURS AS NEEDED FOR ANXIETY, Disp: 90 tablet, Rfl: 1   LANTUS  SOLOSTAR 100 UNIT/ML Solostar Pen, Inject 52 Units into the skin at bedtime., Disp: , Rfl:    lidocaine  (LIDODERM ) 5 %, Place 1 patch onto the skin daily. Remove & Discard patch within 12 hours or as directed by MD, Disp: 30 patch, Rfl: 0   LINZESS  145 MCG CAPS capsule, Take 1 capsule (145 mcg total) by mouth every morning., Disp: 90 capsule, Rfl: 1   losartan -hydrochlorothiazide (HYZAAR) 100-12.5 MG tablet, Take 1 tablet by mouth daily., Disp: 90 tablet, Rfl:  1   methocarbamol  (ROBAXIN ) 500 MG tablet, Take 1 tablet by mouth 4 times daily., Disp: 30 tablet, Rfl: 0   OZEMPIC , 2 MG/DOSE, 8 MG/3ML SOPN, Inject 2 mg into the skin once a week., Disp: 3 mL, Rfl: 0   rosuvastatin  (CRESTOR ) 40 MG tablet, Take 1 tablet (40 mg total) by mouth daily., Disp: 90 tablet, Rfl: 1   Allergies  Allergen Reactions   Doxycycline  Hives and Itching   Flagyl [Metronidazole] Hives and Itching    Benadryl      Past Medical History:  Diagnosis Date   Allergy At 58 years old   Anxiety    Arthritis    Chronic headaches    DDD  (degenerative disc disease), cervical    Fibromyalgia    GERD (gastroesophageal reflux disease)    History of colon polyps    History of psychosis    Hypercholesteremia    Hypertension    IBS (irritable bowel syndrome)    IBS (irritable bowel syndrome)    MDD (major depressive disorder)    Right sided facial pain    Seasonal allergic rhinitis    Sinus congestion    12-24-2018 per pt no fever   Type 2 diabetes mellitus treated with insulin  (HCC)    Vitamin D  deficiency    Wears dentures    full upper,  partial lower   Wears glasses      Past Surgical History:  Procedure Laterality Date   ABDOMINAL HYSTERECTOMY     ARTERY BIOPSY Right 12/28/2018   Procedure: RIGHT BIOPSY TEMPORAL ARTERY;  Surgeon: Kinsinger, Herlene Righter, MD;  Location: [facility];  Service: General;  Laterality: Right;   BRAIN SURGERY     CARDIAC CATHETERIZATION     MULTIPLE TOOTH EXTRACTIONS  04/ 2019    sedation   TOTAL ABDOMINAL HYSTERECTOMY W/ BILATERAL SALPINGOOPHORECTOMY  2009   TUBAL LIGATION      Family History  Problem Relation Age of Onset   Diabetes Mother    Heart disease Mother    High blood pressure Mother    Hyperlipidemia Mother    Anxiety disorder Mother    Arthritis Mother    Depression Mother    Varicose Veins Mother    Diabetes Mellitus II Father    Alcohol abuse Father    Diabetes Father    Heart Problems Brother    Drug abuse Brother    Breast cancer Maternal Aunt    Cervical cancer Paternal Aunt    Diabetes Paternal Uncle    Heart disease Paternal Grandmother    Breast cancer Paternal Grandmother    Head & neck cancer Cousin    Stroke Maternal Grandmother    Vision loss Maternal Grandmother    ADD / ADHD Son    Cancer Maternal Aunt    Colon cancer Neg Hx    Colon polyps Neg Hx    Esophageal cancer Neg Hx    Rectal cancer Neg Hx    Stomach cancer Neg Hx     Social History   Tobacco Use   Smoking status: Every Day    Current packs/day: 1.00     Average packs/day: 1 pack/day for 35.0 years (35.0 ttl pk-yrs)    Types: Cigarettes   Smokeless tobacco: Never  Vaping Use   Vaping status: Never Used  Substance Use Topics   Alcohol use: No   Drug use: No    ROS Refer to HPI for ROS details.  Objective:    Vitals: BP  111/80 (BP Location: Right Arm)   Pulse 76   Temp 98.7 F (37.1 C) (Oral)   Resp 16   Ht 5' 7 (1.702 m)   Wt 218 lb (98.9 kg)   SpO2 97%   BMI 34.14 kg/m   Physical Exam Vitals and nursing note reviewed.  Constitutional:      General: She is not in acute distress.    Appearance: She is well-developed. She is not ill-appearing or toxic-appearing.  HENT:     Head: Normocephalic and atraumatic.     Right Ear: Tympanic membrane, ear canal and external ear normal.     Left Ear: External ear normal. Swelling and tenderness present. No drainage. Tympanic membrane is injected. Tympanic membrane is not erythematous or bulging.     Nose: Congestion and rhinorrhea present.     Mouth/Throat:     Mouth: Mucous membranes are moist.     Pharynx: Oropharynx is clear.  Cardiovascular:     Rate and Rhythm: Normal rate.  Pulmonary:     Effort: Pulmonary effort is normal. No respiratory distress.     Breath sounds: No stridor. No wheezing.  Musculoskeletal:        General: Normal range of motion.  Skin:    General: Skin is warm and dry.  Neurological:     General: No focal deficit present.     Mental Status: She is alert and oriented to person, place, and time.  Psychiatric:        Mood and Affect: Mood normal.        Behavior: Behavior normal.     Procedures  No results found for this or any previous visit (from the past 24 hours).  Assessment and Plan :     Discharge Instructions       1. Viral upper respiratory tract infection (Primary) - ibuprofen  (ADVIL ) 600 MG tablet; Take 1 tablet (600 mg total) by mouth every 6 (six) hours as needed.  Dispense: 30 tablet; Refill: 0 - amoxicillin -clavulanate  (AUGMENTIN ) 875-125 MG tablet; Take 1 tablet by mouth every 12 (twelve) hours for 5 days.  Dispense: 10 tablet; Refill: 0  2. Infective otitis externa of left ear - ciprofloxacin -dexamethasone  (CIPRODEX ) OTIC suspension; Place 4 drops into the left ear 2 (two) times daily.  Dispense: 7.5 mL; Refill: 0  -Continue to monitor symptoms for any change in severity if there is any escalation of current symptoms or development of new symptoms follow-up in ER for further evaluation and management.      Trayvon Trumbull B [place] Masaki   Brailey Buescher, [place] B, [place] 10/30/24 1732

## 2024-10-30 NOTE — Discharge Instructions (Signed)
  1. Viral upper respiratory tract infection (Primary) - ibuprofen  (ADVIL ) 600 MG tablet; Take 1 tablet (600 mg total) by mouth every 6 (six) hours as needed.  Dispense: 30 tablet; Refill: 0 - amoxicillin -clavulanate (AUGMENTIN ) 875-125 MG tablet; Take 1 tablet by mouth every 12 (twelve) hours for 5 days.  Dispense: 10 tablet; Refill: 0  2. Infective otitis externa of left ear - ciprofloxacin -dexamethasone  (CIPRODEX ) OTIC suspension; Place 4 drops into the left ear 2 (two) times daily.  Dispense: 7.5 mL; Refill: 0  -Continue to monitor symptoms for any change in severity if there is any escalation of current symptoms or development of new symptoms follow-up in ER for further evaluation and management.

## 2024-11-07 ENCOUNTER — Other Ambulatory Visit: Payer: Self-pay | Admitting: Nurse Practitioner

## 2024-11-07 DIAGNOSIS — E1143 Type 2 diabetes mellitus with diabetic autonomic (poly)neuropathy: Secondary | ICD-10-CM

## 2024-11-16 DIAGNOSIS — Z79899 Other long term (current) drug therapy: Secondary | ICD-10-CM | POA: Diagnosis not present

## 2024-11-18 DIAGNOSIS — Z79899 Other long term (current) drug therapy: Secondary | ICD-10-CM | POA: Diagnosis not present

## 2024-11-25 ENCOUNTER — Other Ambulatory Visit: Payer: Self-pay

## 2024-12-05 ENCOUNTER — Other Ambulatory Visit: Payer: Self-pay

## 2024-12-05 ENCOUNTER — Ambulatory Visit (INDEPENDENT_AMBULATORY_CARE_PROVIDER_SITE_OTHER): Payer: Self-pay | Admitting: Nurse Practitioner

## 2024-12-05 ENCOUNTER — Telehealth: Payer: Self-pay

## 2024-12-05 VITALS — BP 111/69 | HR 70 | Wt 210.0 lb

## 2024-12-05 DIAGNOSIS — E1142 Type 2 diabetes mellitus with diabetic polyneuropathy: Secondary | ICD-10-CM

## 2024-12-05 DIAGNOSIS — E1143 Type 2 diabetes mellitus with diabetic autonomic (poly)neuropathy: Secondary | ICD-10-CM | POA: Diagnosis not present

## 2024-12-05 DIAGNOSIS — F17200 Nicotine dependence, unspecified, uncomplicated: Secondary | ICD-10-CM | POA: Diagnosis not present

## 2024-12-05 DIAGNOSIS — E785 Hyperlipidemia, unspecified: Secondary | ICD-10-CM

## 2024-12-05 DIAGNOSIS — E66811 Obesity, class 1: Secondary | ICD-10-CM | POA: Diagnosis not present

## 2024-12-05 DIAGNOSIS — F419 Anxiety disorder, unspecified: Secondary | ICD-10-CM | POA: Diagnosis not present

## 2024-12-05 DIAGNOSIS — I10 Essential (primary) hypertension: Secondary | ICD-10-CM | POA: Diagnosis not present

## 2024-12-05 DIAGNOSIS — Z0000 Encounter for general adult medical examination without abnormal findings: Secondary | ICD-10-CM

## 2024-12-05 DIAGNOSIS — Z794 Long term (current) use of insulin: Secondary | ICD-10-CM | POA: Diagnosis not present

## 2024-12-05 DIAGNOSIS — F331 Major depressive disorder, recurrent, moderate: Secondary | ICD-10-CM

## 2024-12-05 LAB — POCT GLYCOSYLATED HEMOGLOBIN (HGB A1C): Hemoglobin A1C: 7.3 % — AB (ref 4.0–5.6)

## 2024-12-05 MED ORDER — EZETIMIBE 10 MG PO TABS: 10.0000 mg | ORAL_TABLET | Freq: Every day | ORAL | 3 refills | Status: AC

## 2024-12-05 MED ORDER — ROSUVASTATIN CALCIUM 40 MG PO TABS: 40.0000 mg | ORAL_TABLET | Freq: Every day | ORAL | 1 refills | Status: AC

## 2024-12-05 MED ORDER — EMPAGLIFLOZIN 10 MG PO TABS: 10.0000 mg | ORAL_TABLET | Freq: Every day | ORAL | 3 refills | Status: AC

## 2024-12-05 MED ORDER — FENOFIBRATE 48 MG PO TABS: 48.0000 mg | ORAL_TABLET | Freq: Every day | ORAL | 1 refills | Status: AC

## 2024-12-05 MED ORDER — OZEMPIC (2 MG/DOSE) 8 MG/3ML ~~LOC~~ SOPN: 2.0000 mg | PEN_INJECTOR | SUBCUTANEOUS | 2 refills | Status: AC

## 2024-12-05 MED ORDER — LOSARTAN POTASSIUM-HCTZ 100-12.5 MG PO TABS: 1.0000 | ORAL_TABLET | Freq: Every day | ORAL | 1 refills | Status: DC

## 2024-12-05 MED ORDER — DULOXETINE HCL 60 MG PO CPEP: 60.0000 mg | ORAL_CAPSULE | Freq: Every day | ORAL | 2 refills | Status: AC

## 2024-12-05 NOTE — Assessment & Plan Note (Signed)
 Controlled on Crestor  40 mg daily, Zetia  10 mg daily Takes fenofibrate  48 mg daily for hypertriglycerides Continue current medications Checking lipid panel

## 2024-12-05 NOTE — Assessment & Plan Note (Signed)
 BP Readings from Last 3 Encounters:  12/05/24 111/69  10/30/24 111/80  09/05/24 128/69   HTN Controlled .  On losartan -hydrochlorothiazide 100-12.5 mg daily Continue current medications. No changes in management. Discussed DASH diet and dietary sodium restrictions Continue to increase dietary efforts and exercise.

## 2024-12-05 NOTE — Assessment & Plan Note (Addendum)
 Smokes about 1 pack/day  Asked about quitting: confirms that she currently smokes cigarettes Advise to quit smoking: Educated about QUITTING to reduce the risk of cancer, cardio and cerebrovascular disease. Assess willingness: Unwilling to quit at this time, but is working on cutting back. Assist with counseling and pharmacotherapy: Counseled for 3 minutes and literature provided. Arrange for follow up: follow up in 3 months and continue to offer help.  Declined lung cancer screening

## 2024-12-05 NOTE — Assessment & Plan Note (Signed)
 Wt Readings from Last 3 Encounters:  12/05/24 210 lb (95.3 kg)  10/30/24 218 lb (98.9 kg)  09/05/24 217 lb (98.4 kg)   Body mass index is 32.89 kg/m.  Patient counseled on low-carb diet Encouraged regular moderate exercise at least 150 minutes weekly as tolerated On Ozempic  for type 2 diabetes

## 2024-12-05 NOTE — Assessment & Plan Note (Signed)
 Annual exam as documented.  Counseling done include healthy lifestyle involving committing to 150 minutes of exercise per week, heart healthy diet, and attaining healthy weight. The importance of adequate sleep also discussed.  Regular use of seat belt and home safety were also discussed . Changes in health habits are decided on by patient with goals and time frames set for achieving them. Immunization and cancer screening  needs are specifically addressed at this visit.    Will get vaccine records from pharmacy,up to date with shingles  and flu vaccine, has also received pneumonia vaccine

## 2024-12-05 NOTE — Progress Notes (Signed)
 "  Complete physical exam  Patient: Ellen Avery   DOB: [birth year]   58 y.o. Female  MRN: [id number]  Subjective:    Chief Complaint  Patient presents with   Annual Exam       Discussed the use of AI scribe software for clinical note transcription with the patient, who gave verbal consent to proceed.  History of Present Illness [name] is a 58 year old female  has a past medical history of Allergy (At 58 years old), Anxiety, Arthritis, Chronic headaches, DDD (degenerative disc disease), cervical, Fibromyalgia, GERD (gastroesophageal reflux disease), History of colon polyps, History of psychosis, Hypercholesteremia, Hypertension, IBS (irritable bowel syndrome), IBS (irritable bowel syndrome), MDD (major depressive disorder), Right sided facial pain, Seasonal allergic rhinitis, Sinus congestion, Type 2 diabetes mellitus treated with insulin  (HCC), Vitamin D  deficiency, Wears dentures, and Wears glasses.  who presents for a physical exam and management of her diabetes and neuropathy.  She experiences ongoing neuropathy in her feet, describing it as painful and difficult to manage, especially during winter. The pain is severe, particularly affecting her big toe, and she sometimes increases her medication dosage to manage it. She takes gabapentin  600 mg three times daily and occasionally increases the dose depending on pain severity. She also uses Vicodin methocarbamol  for pain management, which she finds ineffective. She has tried duloxetine  and amitriptyline  but does not take them consistently due to side effects like drowsiness.  She manages her diabetes with Ozempic  2 mg weekly and recently stopped Lantus  insulin  due to hypoglycemia, with blood sugar dropping to the 50s. She was taking about 55 units of Lantus  before stopping it two weeks ago. Her blood sugar readings vary, with lows of 80 and highs of 213. She checks her blood sugar irregularly and has experienced issues with her  Freestyle [employer] monitor, which she believes was giving inaccurate readings.  She is on multiple medications for cholesterol management, including Zetia , rosuvastatin , and fenofibrate .  She has a history of anxiety and takes duloxetine  for it, but she is concerned about medication side effects, including drowsiness, which has led to falls. She lives alone and is cautious about medication side effects that could increase her risk of falling.  She smokes a pack of cigarettes a day. She does not use drugs or alcohol.  She has been losing weight, reporting a decrease from 217 pounds in September to 210 pounds currently. She attributes this to staying active, walking, and participating in church activities.   Assessment and Plan Assessment & Plan     Most recent fall risk assessment:    04/22/2023   10:21 AM  Fall Risk   Falls in the past year? 0  Number falls in past yr: 0  Injury with Fall? 0   Risk for fall due to : No Fall Risks  Follow up Falls evaluation completed     Data saved with a previous flowsheet row definition     Most recent depression screenings:    12/05/2024    1:25 PM 09/05/2024    1:46 PM  PHQ 2/9 Scores  PHQ - 2 Score 1 4  PHQ- 9 Score 10 14      Data saved with a previous flowsheet row definition        Patient Care Team: Makayla Confer R, FNP as PCP - General (Nurse Practitioner) Monetta Redell PARAS, MD as PCP - Cardiology (Cardiology)   Show/hide medication list[1]  Review of Systems  Constitutional:  Negative for  appetite change, chills, fatigue and fever.  HENT:  Negative for congestion, postnasal drip, rhinorrhea and sneezing.   Eyes:  Negative for pain, discharge and itching.  Respiratory:  Negative for cough, shortness of breath and wheezing.   Cardiovascular:  Negative for chest pain, palpitations and leg swelling.  Gastrointestinal:  Negative for abdominal pain, constipation, nausea and vomiting.  Endocrine: Negative for cold  intolerance, heat intolerance and polydipsia.  Genitourinary:  Negative for difficulty urinating, dysuria, flank pain and frequency.  Musculoskeletal:  Positive for arthralgias. Negative for back pain, joint swelling and myalgias.  Skin:  Negative for color change, pallor, rash and wound.  Neurological:  Negative for dizziness, facial asymmetry, weakness and headaches.  Psychiatric/Behavioral:  Negative for behavioral problems, confusion, self-injury and suicidal ideas.        Objective:     BP 111/69   Pulse 70   Wt 210 lb (95.3 kg)   SpO2 97%   BMI 32.89 kg/m    Physical Exam Vitals and nursing note reviewed. Exam conducted with a chaperone present.  Constitutional:      General: She is not in acute distress.    Appearance: Normal appearance. She is obese. She is not ill-appearing, toxic-appearing or diaphoretic.  HENT:     Right Ear: Tympanic membrane, ear canal and external ear normal. There is no impacted cerumen.     Left Ear: Tympanic membrane, ear canal and external ear normal. There is no impacted cerumen.     Nose: Nose normal. No congestion or rhinorrhea.     Mouth/Throat:     Mouth: Mucous membranes are moist.     Pharynx: Oropharynx is clear. No oropharyngeal exudate or posterior oropharyngeal erythema.  Eyes:     General: No scleral icterus.       Right eye: No discharge.        Left eye: No discharge.     Extraocular Movements: Extraocular movements intact.     Conjunctiva/sclera: Conjunctivae normal.  Neck:     Vascular: No carotid bruit.  Cardiovascular:     Rate and Rhythm: Normal rate and regular rhythm.     Pulses: Normal pulses.     Heart sounds: Normal heart sounds. No murmur heard.    No friction rub. No gallop.  Pulmonary:     Effort: Pulmonary effort is normal. No respiratory distress.     Breath sounds: Normal breath sounds. No stridor. No wheezing, rhonchi or rales.  Chest:     Chest wall: No mass, lacerations, deformity, swelling,  tenderness or crepitus.  Breasts:    Tanner Score is 5.     Right: Normal. No swelling, bleeding, inverted nipple, mass, nipple discharge, skin change or tenderness.     Left: Normal. No swelling, bleeding, inverted nipple, mass, nipple discharge, skin change or tenderness.  Abdominal:     General: Bowel sounds are normal. There is no distension.     Palpations: Abdomen is soft. There is no mass.     Tenderness: There is no abdominal tenderness. There is no right CVA tenderness, left CVA tenderness, guarding or rebound.     Hernia: No hernia is present.  Musculoskeletal:        General: No swelling, tenderness, deformity or signs of injury.     Cervical back: Normal range of motion and neck supple. No rigidity or tenderness.     Right lower leg: No edema.     Left lower leg: No edema.  Lymphadenopathy:     Cervical:  No cervical adenopathy.     Upper Body:     Right upper body: No supraclavicular, axillary or pectoral adenopathy.     Left upper body: No supraclavicular, axillary or pectoral adenopathy.  Skin:    General: Skin is warm and dry.     Capillary Refill: Capillary refill takes less than 2 seconds.     Coloration: Skin is not jaundiced or pale.     Findings: No bruising, erythema, lesion or rash.  Neurological:     Mental Status: She is alert and oriented to person, place, and time.     Cranial Nerves: No cranial nerve deficit.     Motor: No weakness.     Gait: Gait normal.  Psychiatric:        Mood and Affect: Mood normal.        Behavior: Behavior normal.        Thought Content: Thought content normal.        Judgment: Judgment normal.     Results for orders placed or performed in visit on 12/05/24  POCT glycosylated hemoglobin (Hb A1C)  Result Value Ref Range   Hemoglobin A1C 7.3 (A) 4.0 - 5.6 %   HbA1c POC (<> result, manual entry)     HbA1c, POC (prediabetic range)     HbA1c, POC (controlled diabetic range)         Assessment & Plan:    Routine Health  Maintenance and Physical Exam  Immunization History  Administered Date(s) Administered   Influenza-Unspecified 01/10/2022, 10/03/2022, 08/14/2023   Moderna Covid-19 Fall Seasonal Vaccine 17yrs & older 10/03/2022   PFIZER(Purple Top)SARS-COV-2 Vaccination 03/02/2020, 03/23/2020   Pfizer Covid-19 Vaccine Bivalent Booster 62yrs & up 03/30/2021   Zoster Recombinant(Shingrix) 06/04/2023    Health Maintenance  Topic Date Due   HIV Screening  Never done   Pneumococcal Vaccine: 50+ Years (1 of 2 - PCV) Never done   Hepatitis B Vaccines 19-59 Average Risk (1 of 3 - 19+ 3-dose series) Never done   Lung Cancer Screening  05/10/2021   Zoster Vaccines- Shingrix (2 of 2) 07/30/2023   OPHTHALMOLOGY EXAM  10/14/2023   COVID-19 Vaccine (5 - 2025-26 season) 08/08/2024   Influenza Vaccine  03/07/2025 (Originally 07/08/2024)   Diabetic kidney evaluation - eGFR measurement  03/04/2025   Diabetic kidney evaluation - Urine ACR  03/04/2025   FOOT EXAM  03/04/2025   HEMOGLOBIN A1C  06/05/2025   Mammogram  03/09/2026   Colonoscopy  05/28/2028   Hepatitis C Screening  Completed   HPV VACCINES  Aged Out   Meningococcal B Vaccine  Aged Out   DTaP/Tdap/Td  Discontinued    Discussed health benefits of physical activity, and encouraged her to engage in regular exercise appropriate for her age and condition.  Problem List Items Addressed This Visit       Cardiovascular and Mediastinum   HTN (hypertension)   BP Readings from Last 3 Encounters:  12/05/24 111/69  10/30/24 111/80  09/05/24 128/69   HTN Controlled .  On losartan -hydrochlorothiazide 100-12.5 mg daily Continue current medications. No changes in management. Discussed DASH diet and dietary sodium restrictions Continue to increase dietary efforts and exercise.         Relevant Medications   losartan -hydrochlorothiazide (HYZAAR) 100-12.5 MG tablet   rosuvastatin  (CRESTOR ) 40 MG tablet   fenofibrate  (TRICOR ) 48 MG tablet   ezetimibe  (ZETIA )  10 MG tablet   Other Relevant Orders   CMP14+EGFR   CBC     Endocrine  Type 2 diabetes mellitus with diabetic autonomic neuropathy, with long-term current use of insulin  (HCC)   Lab Results  Component Value Date   HGBA1C 7.3 (A) 12/05/2024   Type 2 diabetes mellitus with diabetic autonomic neuropathy A1c increased to 7.3, indicating suboptimal glycemic control. Blood sugar levels dropping too low, leading to discontinuation of Lantus . Neuropathy symptoms persist with inconsistent gabapentin  use. Prefers oral medication over insulin . - Discontinued Lantus  insulin . - Initiated Jardiance  10 mg daily. - Instructed to check fasting blood sugar three times a week. - Will increase Jardiance  to 25 mg daily after four weeks if needed. - Continue Ozempic  2 mg weekly. - Encouraged consistent use of gabapentin  for neuropathy.         Relevant Medications   empagliflozin  (JARDIANCE ) 10 MG TABS tablet   losartan -hydrochlorothiazide (HYZAAR) 100-12.5 MG tablet   Semaglutide , 2 MG/DOSE, (OZEMPIC , 2 MG/DOSE,) 8 MG/3ML SOPN   rosuvastatin  (CRESTOR ) 40 MG tablet   DULoxetine  (CYMBALTA ) 60 MG capsule   Other Relevant Orders   POCT glycosylated hemoglobin (Hb A1C) (Completed)   CMP14+EGFR   CBC   Diabetic polyneuropathy associated with type 2 diabetes mellitus (HCC)   Chronic pain due to diabetic neuropathy Chronic pain persists, particularly in the feet. Gabapentin  and amitriptyline  used inconsistently. Preports increased pain during winter. - Continue gabapentin  600 mg, up to two tablets three times daily.  Duloxetine  60 mg daily, taking amitriptyline  75 mg as ordered, Vicodin as needed, methocarbamol  as needed - Continue Vicodin as prescribed by pain management. - Encouraged follow-up with neurologist for further pain management options.      Relevant Medications   empagliflozin  (JARDIANCE ) 10 MG TABS tablet   losartan -hydrochlorothiazide (HYZAAR) 100-12.5 MG tablet   Semaglutide , 2  MG/DOSE, (OZEMPIC , 2 MG/DOSE,) 8 MG/3ML SOPN   rosuvastatin  (CRESTOR ) 40 MG tablet   DULoxetine  (CYMBALTA ) 60 MG capsule     Other   MDD (major depressive disorder), recurrent episode, moderate (HCC)      12/05/2024    1:25 PM 09/05/2024    1:46 PM 08/02/2024   10:44 AM 08/14/2023   10:30 AM 04/22/2023   10:21 AM  Depression screen PHQ 2/9  Decreased Interest 1 2 1 3 3   Down, Depressed, Hopeless 0 2 1 0 3  PHQ - 2 Score 1 4 2 3 6   Altered sleeping 3 3 3 1 2   Tired, decreased energy 1 3 3 1 3   Change in appetite 3 3 3 3 2   Feeling bad or failure about yourself  0 0 0 0 0  Trouble concentrating 0 0 0 0 0  Moving slowly or fidgety/restless 2 1 3 2 2   Suicidal thoughts 0 0 0 0 0  PHQ-9 Score 10 14  14  10  15    Difficult doing work/chores Somewhat difficult Somewhat difficult Not difficult at all Very difficult Somewhat difficult     Data saved with a previous flowsheet row definition   Anxiety and depression Managed with duloxetine  60 mg daily. Reports drowsiness and falls, possibly related to medication. Not interested in Xanax or similar medications. - Continue duloxetine  60 mg daily. - Will consider referral to psychiatry if additional anxiety management is needed. Denies SI, HI      Relevant Medications   DULoxetine  (CYMBALTA ) 60 MG capsule   Dyslipidemia, goal LDL below 70   Controlled on Crestor  40 mg daily, Zetia  10 mg daily Takes fenofibrate  48 mg daily for hypertriglycerides Continue current medications Checking lipid panel  Relevant Medications   losartan -hydrochlorothiazide (HYZAAR) 100-12.5 MG tablet   rosuvastatin  (CRESTOR ) 40 MG tablet   fenofibrate  (TRICOR ) 48 MG tablet   ezetimibe  (ZETIA ) 10 MG tablet   Other Relevant Orders   LDL Cholesterol, Direct   Tobacco use disorder   Smokes about 1 pack/day  Asked about quitting: confirms that she currently smokes cigarettes Advise to quit smoking: Educated about QUITTING to reduce the risk of cancer, cardio  and cerebrovascular disease. Assess willingness: Unwilling to quit at this time, but is working on cutting back. Assist with counseling and pharmacotherapy: Counseled for 3 minutes and literature provided. Arrange for follow up: follow up in 3 months and continue to offer help.  Declined lung cancer screening      Anxiety      12/05/2024    1:15 PM 09/05/2024    1:47 PM 08/02/2024   10:49 AM 01/23/2017    9:40 AM  GAD 7 : Generalized Anxiety Score  Nervous, Anxious, on Edge 1 3 1 2   Control/stop worrying 1 1 1 2   Worry too much - different things 3 3 1 1   Trouble relaxing 3 1 1 2   Restless 1 3 3  0  Easily annoyed or irritable 2 3 1 2   Afraid - awful might happen 0 0 0 0  Total GAD 7 Score 11 14 8 9   Anxiety Difficulty Extremely difficult Somewhat difficult Not difficult at all Not difficult at all  Anxiety and depression Managed with duloxetine  60 mg daily. Reports drowsiness and falls, possibly related to medication.   On hydroxyzine  25 mg 3 times daily as needed - Continue duloxetine  60 mg daily.  Hydroxyzine  25 mg 3 times daily as needed - Will consider referral to psychiatry if additional anxiety management is needed.          Relevant Medications   DULoxetine  (CYMBALTA ) 60 MG capsule   Annual physical exam - Primary   Annual exam as documented.  Counseling done include healthy lifestyle involving committing to 150 minutes of exercise per week, heart healthy diet, and attaining healthy weight. The importance of adequate sleep also discussed.  Regular use of seat belt and home safety were also discussed . Changes in health habits are decided on by patient with goals and time frames set for achieving them. Immunization and cancer screening  needs are specifically addressed at this visit.    Will get vaccine records from pharmacy,up to date with shingles  and flu vaccine, has also received pneumonia vaccine       Obesity (BMI 30.0-34.9)   Wt Readings from Last 3  Encounters:  12/05/24 210 lb (95.3 kg)  10/30/24 218 lb (98.9 kg)  09/05/24 217 lb (98.4 kg)   Body mass index is 32.89 kg/m.  Patient counseled on low-carb diet Encouraged regular moderate exercise at least 150 minutes weekly as tolerated On Ozempic  for type 2 diabetes      Return in about 4 weeks (around 01/02/2025) for DM.     Laconya Clere R Jomel Whittlesey, FNP     [1]  Outpatient Medications Prior to Visit  Medication Sig   ACCU-CHEK GUIDE TEST test strip Use as instructed   amitriptyline  (ELAVIL ) 75 MG tablet TAKE 1 TABLET BY MOUTH AT BEDTIME   aspirin  EC 81 MG tablet Take 81 mg by mouth daily.   famotidine  (PEPCID ) 20 MG tablet Take 1 tablet by mouth every morning.   fexofenadine  (ALLEGRA  ALLERGY) 180 MG tablet Take 1 tablet (180 mg total) by mouth daily.  fluticasone  (FLONASE ) 50 MCG/ACT nasal spray Place 2 sprays into both nostrils daily.   gabapentin  (NEURONTIN ) 600 MG tablet TAKE 1 AND 1/2 TABLETS BY MOUTH 3 TIMES DAILY   HYDROcodone -acetaminophen  (NORCO/VICODIN) 5-325 MG tablet Take 1 tablet by mouth every 6 (six) hours as needed for moderate pain (pain score 4-6).   hydrOXYzine  (ATARAX ) 25 MG tablet TAKE 1 TABLET BY MOUTH EVERY 8 HOURS AS NEEDED FOR ANXIETY   lidocaine  (LIDODERM ) 5 % Place 1 patch onto the skin daily. Remove & Discard patch within 12 hours or as directed by MD   LINZESS  145 MCG CAPS capsule Take 1 capsule (145 mcg total) by mouth every morning.   methocarbamol  (ROBAXIN ) 500 MG tablet Take 1 tablet by mouth 4 times daily.   [DISCONTINUED] DULoxetine  (CYMBALTA ) 60 MG capsule Take 1 capsule (60 mg total) by mouth daily.   [DISCONTINUED] ezetimibe  (ZETIA ) 10 MG tablet Take 1 tablet (10 mg total) by mouth daily.   [DISCONTINUED] fenofibrate  (TRICOR ) 48 MG tablet TAKE 1 TABLET BY MOUTH DAILY   [DISCONTINUED] losartan -hydrochlorothiazide (HYZAAR) 100-12.5 MG tablet Take 1 tablet by mouth daily.   [DISCONTINUED] OZEMPIC , 2 MG/DOSE, 8 MG/3ML SOPN Inject 2 mg into the  skin once a week.   [DISCONTINUED] rosuvastatin  (CRESTOR ) 40 MG tablet Take 1 tablet (40 mg total) by mouth daily.   Cholecalciferol (VITAMIN D3) 3000 units TABS Take 3,000 Units by mouth daily as needed (vitamin d ). (Patient not taking: Reported on 12/05/2024)   ciprofloxacin -dexamethasone  (CIPRODEX ) OTIC suspension Place 4 drops into the left ear 2 (two) times daily.   cyanocobalamin  (VITAMIN B12) 1000 MCG tablet Take 1,000 mcg by mouth daily. (Patient not taking: Reported on 12/05/2024)   ibuprofen  (ADVIL ) 600 MG tablet Take 1 tablet (600 mg total) by mouth every 6 (six) hours as needed.   [DISCONTINUED] LANTUS  SOLOSTAR 100 UNIT/ML Solostar Pen Inject 52 Units into the skin at bedtime. (Patient not taking: Reported on 12/05/2024)   No facility-administered medications prior to visit.   "

## 2024-12-05 NOTE — Telephone Encounter (Signed)
 Pharmacy Patient Advocate Encounter   Received notification from CoverMyMeds that prior authorization for JARDIANCE  is required/requested.   Insurance verification completed.   The patient is insured through [facility] MEDICAID.   Per test claim: PA required; PA submitted to above mentioned insurance via CoverMyMeds Key/confirmation #/EOC [id number] Status is pending

## 2024-12-05 NOTE — Assessment & Plan Note (Addendum)
 Chronic pain due to diabetic neuropathy Chronic pain persists, particularly in the feet. Gabapentin  and amitriptyline  used inconsistently. Preports increased pain during winter. - Continue gabapentin  600 mg, up to two tablets three times daily.  Duloxetine  60 mg daily, taking amitriptyline  75 mg as ordered, Vicodin as needed, methocarbamol  as needed - Continue Vicodin as prescribed by pain management. - Encouraged follow-up with neurologist for further pain management options.

## 2024-12-05 NOTE — Telephone Encounter (Signed)
 Pharmacy Patient Advocate Encounter  Received notification from [facility] MEDICAID that Prior Authorization for JARDIANCE  has been APPROVED from 12/05/2024 to 12/05/2025   PA #/Case ID/Reference #: [id number]  LEFT VOICEMAIL ON PROVIDER'S LINE INFORMING [facility] TO REPROCESS RX IF APPROPRIATE.

## 2024-12-05 NOTE — Assessment & Plan Note (Addendum)
" °    12/05/2024    1:15 PM 09/05/2024    1:47 PM 08/02/2024   10:49 AM 01/23/2017    9:40 AM  GAD 7 : Generalized Anxiety Score  Nervous, Anxious, on Edge 1 3 1 2   Control/stop worrying 1 1 1 2   Worry too much - different things 3 3 1 1   Trouble relaxing 3 1 1 2   Restless 1 3 3  0  Easily annoyed or irritable 2 3 1 2   Afraid - awful might happen 0 0 0 0  Total GAD 7 Score 11 14 8 9   Anxiety Difficulty Extremely difficult Somewhat difficult Not difficult at all Not difficult at all  Anxiety and depression Managed with duloxetine  60 mg daily. Reports drowsiness and falls, possibly related to medication.   On hydroxyzine  25 mg 3 times daily as needed - Continue duloxetine  60 mg daily.  Hydroxyzine  25 mg 3 times daily as needed - Will consider referral to psychiatry if additional anxiety management is needed.     "

## 2024-12-05 NOTE — Assessment & Plan Note (Signed)
 Lab Results  Component Value Date   HGBA1C 7.3 (A) 12/05/2024   Type 2 diabetes mellitus with diabetic autonomic neuropathy A1c increased to 7.3, indicating suboptimal glycemic control. Blood sugar levels dropping too low, leading to discontinuation of Lantus . Neuropathy symptoms persist with inconsistent gabapentin  use. Prefers oral medication over insulin . - Discontinued Lantus  insulin . - Initiated Jardiance  10 mg daily. - Instructed to check fasting blood sugar three times a week. - Will increase Jardiance  to 25 mg daily after four weeks if needed. - Continue Ozempic  2 mg weekly. - Encouraged consistent use of gabapentin  for neuropathy.

## 2024-12-05 NOTE — Patient Instructions (Addendum)
 Goal for fasting blood sugar ranges from 80 to 120 and 2 hours after any meal or at bedtime should be between 130 to 170.   1. Dyslipidemia, goal LDL below 70 (Primary) - LDL Cholesterol, Direct - rosuvastatin  (CRESTOR ) 40 MG tablet; Take 1 tablet (40 mg total) by mouth daily.  Dispense: 90 tablet; Refill: 1 - fenofibrate  (TRICOR ) 48 MG tablet; Take 1 tablet (48 mg total) by mouth daily.  Dispense: 90 tablet; Refill: 1 - ezetimibe  (ZETIA ) 10 MG tablet; Take 1 tablet (10 mg total) by mouth daily.  Dispense: 90 tablet; Refill: 3  2. MDD (major depressive disorder), recurrent episode, moderate (HCC) - DULoxetine  (CYMBALTA ) 60 MG capsule; Take 1 capsule (60 mg total) by mouth daily.  Dispense: 90 capsule; Refill: 2  3. Anxiety - DULoxetine  (CYMBALTA ) 60 MG capsule; Take 1 capsule (60 mg total) by mouth daily.  Dispense: 90 capsule; Refill: 2  4. Tobacco abuse  5. Type 2 diabetes mellitus with diabetic autonomic neuropathy, with long-term current use of insulin  (HCC) - POCT glycosylated hemoglobin (Hb A1C) - CMP14+EGFR - CBC - empagliflozin  (JARDIANCE ) 10 MG TABS tablet; Take 1 tablet (10 mg total) by mouth daily.  Dispense: 90 tablet; Refill: 3 - Semaglutide , 2 MG/DOSE, (OZEMPIC , 2 MG/DOSE,) 8 MG/3ML SOPN; Inject 2 mg into the skin once a week.  Dispense: 9 mL; Refill: 2 - DULoxetine  (CYMBALTA ) 60 MG capsule; Take 1 capsule (60 mg total) by mouth daily.  Dispense: 90 capsule; Refill: 2  6. Primary hypertension - CMP14+EGFR - CBC - losartan -hydrochlorothiazide (HYZAAR) 100-12.5 MG tablet; Take 1 tablet by mouth daily.  Dispense: 90 tablet; Refill: 1    It is important that you exercise regularly at least 30 minutes 5 times a week as tolerated  Think about what you will eat, plan ahead. Choose  clean, green, fresh or frozen over canned, processed or packaged foods which are more sugary, salty and fatty. 70 to 75% of food eaten should be vegetables and fruit. Three meals at set times  with snacks allowed between meals, but they must be fruit or vegetables. Aim to eat over a 12 hour period , example 7 am to 7 pm, and STOP after  your last meal of the day. Drink water,generally about 64 ounces per day, no other drink is as healthy. Fruit juice is best enjoyed in a healthy way, by EATING the fruit.  Thanks for choosing [facility] we consider it a privelige to serve you.

## 2024-12-05 NOTE — Assessment & Plan Note (Signed)
" °    12/05/2024    1:25 PM 09/05/2024    1:46 PM 08/02/2024   10:44 AM 08/14/2023   10:30 AM 04/22/2023   10:21 AM  Depression screen PHQ 2/9  Decreased Interest 1 2 1 3 3   Down, Depressed, Hopeless 0 2 1 0 3  PHQ - 2 Score 1 4 2 3 6   Altered sleeping 3 3 3 1 2   Tired, decreased energy 1 3 3 1 3   Change in appetite 3 3 3 3 2   Feeling bad or failure about yourself  0 0 0 0 0  Trouble concentrating 0 0 0 0 0  Moving slowly or fidgety/restless 2 1 3 2 2   Suicidal thoughts 0 0 0 0 0  PHQ-9 Score 10 14  14  10  15    Difficult doing work/chores Somewhat difficult Somewhat difficult Not difficult at all Very difficult Somewhat difficult     Data saved with a previous flowsheet row definition   Anxiety and depression Managed with duloxetine  60 mg daily. Reports drowsiness and falls, possibly related to medication. Not interested in Xanax or similar medications. - Continue duloxetine  60 mg daily. - Will consider referral to psychiatry if additional anxiety management is needed. Denies SI, HI "

## 2024-12-06 ENCOUNTER — Other Ambulatory Visit: Payer: Self-pay | Admitting: Nurse Practitioner

## 2024-12-06 ENCOUNTER — Other Ambulatory Visit: Payer: Self-pay

## 2024-12-06 ENCOUNTER — Ambulatory Visit: Payer: Self-pay | Admitting: Nurse Practitioner

## 2024-12-06 DIAGNOSIS — F419 Anxiety disorder, unspecified: Secondary | ICD-10-CM

## 2024-12-06 DIAGNOSIS — E1142 Type 2 diabetes mellitus with diabetic polyneuropathy: Secondary | ICD-10-CM

## 2024-12-06 DIAGNOSIS — Z794 Long term (current) use of insulin: Secondary | ICD-10-CM

## 2024-12-06 DIAGNOSIS — F331 Major depressive disorder, recurrent, moderate: Secondary | ICD-10-CM

## 2024-12-06 DIAGNOSIS — I10 Essential (primary) hypertension: Secondary | ICD-10-CM

## 2024-12-06 LAB — CMP14+EGFR
ALT: 12 IU/L (ref 0–32)
AST: 12 IU/L (ref 0–40)
Albumin: 4.1 g/dL (ref 3.8–4.9)
Alkaline Phosphatase: 91 IU/L (ref 49–135)
BUN/Creatinine Ratio: 13 (ref 9–23)
BUN: 10 mg/dL (ref 6–24)
Bilirubin Total: <0.2 mg/dL (ref 0.0–1.2)
CO2: 24 mmol/L (ref 20–29)
Calcium: 9.9 mg/dL (ref 8.7–10.2)
Chloride: 104 mmol/L (ref 96–106)
Creatinine, Ser: 0.78 mg/dL (ref 0.57–1.00)
Globulin, Total: 2.3 g/dL (ref 1.5–4.5)
Glucose: 114 mg/dL — ABNORMAL HIGH (ref 70–99)
Potassium: 4.3 mmol/L (ref 3.5–5.2)
Sodium: 140 mmol/L (ref 134–144)
Total Protein: 6.4 g/dL (ref 6.0–8.5)
eGFR: 88 mL/min/1.73

## 2024-12-06 LAB — CBC
Hematocrit: 39.9 % (ref 34.0–46.6)
Hemoglobin: 13.2 g/dL (ref 11.1–15.9)
MCH: 28.7 pg (ref 26.6–33.0)
MCHC: 33.1 g/dL (ref 31.5–35.7)
MCV: 87 fL (ref 79–97)
Platelets: 343 x10E3/uL (ref 150–450)
RBC: 4.6 x10E6/uL (ref 3.77–5.28)
RDW: 12.8 % (ref 11.7–15.4)
WBC: 6.9 x10E3/uL (ref 3.4–10.8)

## 2024-12-06 LAB — LDL CHOLESTEROL, DIRECT: LDL Direct: 62 mg/dL (ref 0–99)

## 2024-12-06 NOTE — Telephone Encounter (Signed)
 Please advise [facility]

## 2024-12-06 NOTE — Telephone Encounter (Signed)
 DULoxetine  (CYMBALTA ) 60 MG capsule [Pharmacy Med Name: duloxetine  60 mg capsule,delayed release]

## 2024-12-06 NOTE — Telephone Encounter (Signed)
 lidocaine  (LIDODERM ) 5 % [Pharmacy Med Name: lidocaine  5 % topical patch]

## 2024-12-09 DIAGNOSIS — M549 Dorsalgia, unspecified: Secondary | ICD-10-CM

## 2024-12-09 DIAGNOSIS — E1142 Type 2 diabetes mellitus with diabetic polyneuropathy: Secondary | ICD-10-CM

## 2024-12-09 MED ORDER — LIDOCAINE 5 % EX PTCH: 1.0000 | MEDICATED_PATCH | CUTANEOUS | 0 refills | Status: AC

## 2024-12-09 MED ORDER — METHOCARBAMOL 500 MG PO TABS: 500.0000 mg | ORAL_TABLET | Freq: Four times a day (QID) | ORAL | 0 refills | Status: AC

## 2024-12-30 ENCOUNTER — Ambulatory Visit: Admitting: Podiatry

## 2024-12-30 DIAGNOSIS — S96211A Strain of intrinsic muscle and tendon at ankle and foot level, right foot, initial encounter: Secondary | ICD-10-CM | POA: Diagnosis not present

## 2024-12-30 DIAGNOSIS — M62461 Contracture of muscle, right lower leg: Secondary | ICD-10-CM | POA: Diagnosis not present

## 2024-12-30 DIAGNOSIS — M722 Plantar fascial fibromatosis: Secondary | ICD-10-CM

## 2024-12-30 NOTE — Progress Notes (Unsigned)
 Right PF injection brace

## 2025-01-02 ENCOUNTER — Ambulatory Visit: Payer: Self-pay | Admitting: Nurse Practitioner

## 2025-01-27 ENCOUNTER — Ambulatory Visit: Admitting: Podiatry
